# Patient Record
Sex: Female | Born: 1965 | Race: Black or African American | Hispanic: No | Marital: Single | Smoking: Never smoker
Health system: Southern US, Community
[De-identification: ages and names within clinical notes are randomized; demographics above are authoritative.]

## PROBLEM LIST (undated history)

## (undated) DIAGNOSIS — K589 Irritable bowel syndrome without diarrhea: Secondary | ICD-10-CM

## (undated) DIAGNOSIS — Z9289 Personal history of other medical treatment: Secondary | ICD-10-CM

## (undated) DIAGNOSIS — G43909 Migraine, unspecified, not intractable, without status migrainosus: Secondary | ICD-10-CM

## (undated) DIAGNOSIS — R42 Dizziness and giddiness: Secondary | ICD-10-CM

## (undated) DIAGNOSIS — Z9229 Personal history of other drug therapy: Secondary | ICD-10-CM

## (undated) DIAGNOSIS — J45909 Unspecified asthma, uncomplicated: Secondary | ICD-10-CM

## (undated) DIAGNOSIS — C50919 Malignant neoplasm of unspecified site of unspecified female breast: Secondary | ICD-10-CM

## (undated) DIAGNOSIS — F32A Depression, unspecified: Secondary | ICD-10-CM

## (undated) DIAGNOSIS — K5090 Crohn's disease, unspecified, without complications: Secondary | ICD-10-CM

## (undated) DIAGNOSIS — Z9221 Personal history of antineoplastic chemotherapy: Secondary | ICD-10-CM

## (undated) DIAGNOSIS — N200 Calculus of kidney: Secondary | ICD-10-CM

## (undated) DIAGNOSIS — D279 Benign neoplasm of unspecified ovary: Secondary | ICD-10-CM

## (undated) DIAGNOSIS — G629 Polyneuropathy, unspecified: Secondary | ICD-10-CM

## (undated) DIAGNOSIS — I10 Essential (primary) hypertension: Secondary | ICD-10-CM

## (undated) DIAGNOSIS — D649 Anemia, unspecified: Secondary | ICD-10-CM

## (undated) DIAGNOSIS — F419 Anxiety disorder, unspecified: Secondary | ICD-10-CM

## (undated) DIAGNOSIS — M1990 Unspecified osteoarthritis, unspecified site: Secondary | ICD-10-CM

## (undated) DIAGNOSIS — D219 Benign neoplasm of connective and other soft tissue, unspecified: Secondary | ICD-10-CM

## (undated) DIAGNOSIS — M542 Cervicalgia: Secondary | ICD-10-CM

## (undated) DIAGNOSIS — K5190 Ulcerative colitis, unspecified, without complications: Secondary | ICD-10-CM

## (undated) DIAGNOSIS — Z1371 Encounter for nonprocreative screening for genetic disease carrier status: Secondary | ICD-10-CM

## (undated) DIAGNOSIS — K219 Gastro-esophageal reflux disease without esophagitis: Secondary | ICD-10-CM

## (undated) DIAGNOSIS — R202 Paresthesia of skin: Secondary | ICD-10-CM

## (undated) DIAGNOSIS — M549 Dorsalgia, unspecified: Secondary | ICD-10-CM

## (undated) DIAGNOSIS — G8929 Other chronic pain: Secondary | ICD-10-CM

## (undated) DIAGNOSIS — I890 Lymphedema, not elsewhere classified: Secondary | ICD-10-CM

## (undated) DIAGNOSIS — F329 Major depressive disorder, single episode, unspecified: Secondary | ICD-10-CM

## (undated) DIAGNOSIS — Z923 Personal history of irradiation: Secondary | ICD-10-CM

## (undated) DIAGNOSIS — N390 Urinary tract infection, site not specified: Secondary | ICD-10-CM

## (undated) DIAGNOSIS — IMO0002 Reserved for concepts with insufficient information to code with codable children: Secondary | ICD-10-CM

## (undated) DIAGNOSIS — I639 Cerebral infarction, unspecified: Secondary | ICD-10-CM

## (undated) HISTORY — PX: DILATION AND CURETTAGE OF UTERUS: SHX78

## (undated) HISTORY — DX: Benign neoplasm of connective and other soft tissue, unspecified: D21.9

## (undated) HISTORY — DX: Personal history of other drug therapy: Z92.29

## (undated) HISTORY — PX: MYOMECTOMY: SHX85

## (undated) HISTORY — DX: Reserved for concepts with insufficient information to code with codable children: IMO0002

## (undated) HISTORY — DX: Migraine, unspecified, not intractable, without status migrainosus: G43.909

## (undated) HISTORY — DX: Encounter for nonprocreative screening for genetic disease carrier status: Z13.71

## (undated) HISTORY — DX: Benign neoplasm of unspecified ovary: D27.9

## (undated) HISTORY — PX: TONSILLECTOMY: SUR1361

## (undated) HISTORY — DX: Personal history of antineoplastic chemotherapy: Z92.21

## (undated) HISTORY — PX: BREAST LUMPECTOMY: SHX2

## (undated) HISTORY — DX: Unspecified osteoarthritis, unspecified site: M19.90

## (undated) HISTORY — DX: Lymphedema, not elsewhere classified: I89.0

## (undated) HISTORY — PX: CHOLECYSTECTOMY: SHX55

---

## 1996-09-05 DIAGNOSIS — D649 Anemia, unspecified: Secondary | ICD-10-CM

## 1996-09-05 HISTORY — DX: Anemia, unspecified: D64.9

## 2003-07-14 ENCOUNTER — Emergency Department (HOSPITAL_COMMUNITY): Admission: EM | Admit: 2003-07-14 | Discharge: 2003-07-14 | Payer: Self-pay

## 2004-01-05 ENCOUNTER — Emergency Department (HOSPITAL_COMMUNITY): Admission: EM | Admit: 2004-01-05 | Discharge: 2004-01-05 | Payer: Self-pay | Admitting: Emergency Medicine

## 2004-06-14 ENCOUNTER — Other Ambulatory Visit: Admission: RE | Admit: 2004-06-14 | Discharge: 2004-06-14 | Payer: Self-pay | Admitting: Gynecology

## 2004-09-05 DIAGNOSIS — C50919 Malignant neoplasm of unspecified site of unspecified female breast: Secondary | ICD-10-CM

## 2004-09-05 HISTORY — DX: Malignant neoplasm of unspecified site of unspecified female breast: C50.919

## 2004-09-05 HISTORY — PX: BREAST SURGERY: SHX581

## 2004-09-23 ENCOUNTER — Encounter: Admission: RE | Admit: 2004-09-23 | Discharge: 2004-09-23 | Payer: Self-pay | Admitting: General Surgery

## 2004-09-23 ENCOUNTER — Encounter (INDEPENDENT_AMBULATORY_CARE_PROVIDER_SITE_OTHER): Payer: Self-pay | Admitting: Specialist

## 2004-09-23 ENCOUNTER — Encounter (INDEPENDENT_AMBULATORY_CARE_PROVIDER_SITE_OTHER): Payer: Self-pay | Admitting: Diagnostic Radiology

## 2004-09-29 ENCOUNTER — Emergency Department (HOSPITAL_COMMUNITY): Admission: EM | Admit: 2004-09-29 | Discharge: 2004-09-29 | Payer: Self-pay | Admitting: Emergency Medicine

## 2004-09-30 ENCOUNTER — Encounter: Admission: RE | Admit: 2004-09-30 | Discharge: 2004-09-30 | Payer: Self-pay | Admitting: General Surgery

## 2004-10-01 ENCOUNTER — Encounter: Admission: RE | Admit: 2004-10-01 | Discharge: 2004-10-01 | Payer: Self-pay | Admitting: General Surgery

## 2004-10-04 ENCOUNTER — Encounter (INDEPENDENT_AMBULATORY_CARE_PROVIDER_SITE_OTHER): Payer: Self-pay | Admitting: *Deleted

## 2004-10-04 ENCOUNTER — Ambulatory Visit (HOSPITAL_BASED_OUTPATIENT_CLINIC_OR_DEPARTMENT_OTHER): Admission: RE | Admit: 2004-10-04 | Discharge: 2004-10-04 | Payer: Self-pay | Admitting: General Surgery

## 2004-10-04 ENCOUNTER — Ambulatory Visit (HOSPITAL_COMMUNITY): Admission: RE | Admit: 2004-10-04 | Discharge: 2004-10-04 | Payer: Self-pay | Admitting: General Surgery

## 2004-10-05 ENCOUNTER — Ambulatory Visit: Payer: Self-pay | Admitting: Oncology

## 2004-10-18 ENCOUNTER — Ambulatory Visit (HOSPITAL_COMMUNITY): Admission: RE | Admit: 2004-10-18 | Discharge: 2004-10-18 | Payer: Self-pay | Admitting: Oncology

## 2004-10-21 ENCOUNTER — Ambulatory Visit (HOSPITAL_COMMUNITY): Admission: RE | Admit: 2004-10-21 | Discharge: 2004-10-21 | Payer: Self-pay | Admitting: Oncology

## 2004-11-08 ENCOUNTER — Ambulatory Visit (HOSPITAL_BASED_OUTPATIENT_CLINIC_OR_DEPARTMENT_OTHER): Admission: RE | Admit: 2004-11-08 | Discharge: 2004-11-08 | Payer: Self-pay | Admitting: General Surgery

## 2004-11-08 ENCOUNTER — Ambulatory Visit (HOSPITAL_COMMUNITY): Admission: RE | Admit: 2004-11-08 | Discharge: 2004-11-08 | Payer: Self-pay | Admitting: General Surgery

## 2004-11-22 ENCOUNTER — Ambulatory Visit: Payer: Self-pay | Admitting: Oncology

## 2005-01-03 DIAGNOSIS — Z9221 Personal history of antineoplastic chemotherapy: Secondary | ICD-10-CM

## 2005-01-03 HISTORY — DX: Personal history of antineoplastic chemotherapy: Z92.21

## 2005-01-11 ENCOUNTER — Ambulatory Visit: Payer: Self-pay | Admitting: Oncology

## 2005-02-09 ENCOUNTER — Ambulatory Visit: Admission: RE | Admit: 2005-02-09 | Discharge: 2005-04-18 | Payer: Self-pay | Admitting: Radiation Oncology

## 2005-02-11 ENCOUNTER — Encounter: Admission: RE | Admit: 2005-02-11 | Discharge: 2005-02-11 | Payer: Self-pay | Admitting: Radiation Oncology

## 2005-03-09 ENCOUNTER — Emergency Department (HOSPITAL_COMMUNITY): Admission: EM | Admit: 2005-03-09 | Discharge: 2005-03-09 | Payer: Self-pay | Admitting: Emergency Medicine

## 2005-03-14 ENCOUNTER — Ambulatory Visit (HOSPITAL_COMMUNITY): Admission: RE | Admit: 2005-03-14 | Discharge: 2005-03-14 | Payer: Self-pay | Admitting: Oncology

## 2005-03-15 ENCOUNTER — Ambulatory Visit: Payer: Self-pay | Admitting: Oncology

## 2005-04-05 DIAGNOSIS — IMO0001 Reserved for inherently not codable concepts without codable children: Secondary | ICD-10-CM

## 2005-04-05 HISTORY — DX: Reserved for inherently not codable concepts without codable children: IMO0001

## 2005-04-27 ENCOUNTER — Ambulatory Visit (HOSPITAL_BASED_OUTPATIENT_CLINIC_OR_DEPARTMENT_OTHER): Admission: RE | Admit: 2005-04-27 | Discharge: 2005-04-27 | Payer: Self-pay | Admitting: General Surgery

## 2005-05-10 ENCOUNTER — Ambulatory Visit: Payer: Self-pay | Admitting: Oncology

## 2005-06-10 ENCOUNTER — Emergency Department (HOSPITAL_COMMUNITY): Admission: EM | Admit: 2005-06-10 | Discharge: 2005-06-10 | Payer: Self-pay | Admitting: Emergency Medicine

## 2005-06-15 ENCOUNTER — Other Ambulatory Visit: Admission: RE | Admit: 2005-06-15 | Discharge: 2005-06-15 | Payer: Self-pay | Admitting: Gynecology

## 2005-06-30 ENCOUNTER — Ambulatory Visit (HOSPITAL_COMMUNITY): Admission: RE | Admit: 2005-06-30 | Discharge: 2005-06-30 | Payer: Self-pay | Admitting: Gastroenterology

## 2005-07-02 ENCOUNTER — Emergency Department (HOSPITAL_COMMUNITY): Admission: EM | Admit: 2005-07-02 | Discharge: 2005-07-02 | Payer: Self-pay | Admitting: Emergency Medicine

## 2005-07-19 ENCOUNTER — Ambulatory Visit: Payer: Self-pay | Admitting: Oncology

## 2005-07-20 ENCOUNTER — Ambulatory Visit (HOSPITAL_COMMUNITY): Admission: RE | Admit: 2005-07-20 | Discharge: 2005-07-20 | Payer: Self-pay | Admitting: Oncology

## 2005-07-26 ENCOUNTER — Emergency Department (HOSPITAL_COMMUNITY): Admission: EM | Admit: 2005-07-26 | Discharge: 2005-07-26 | Payer: Self-pay | Admitting: Emergency Medicine

## 2005-09-05 HISTORY — PX: ABDOMINAL HYSTERECTOMY: SHX81

## 2005-09-18 ENCOUNTER — Emergency Department (HOSPITAL_COMMUNITY): Admission: EM | Admit: 2005-09-18 | Discharge: 2005-09-18 | Payer: Self-pay | Admitting: Emergency Medicine

## 2005-09-25 ENCOUNTER — Emergency Department (HOSPITAL_COMMUNITY): Admission: EM | Admit: 2005-09-25 | Discharge: 2005-09-25 | Payer: Self-pay | Admitting: Emergency Medicine

## 2005-10-21 ENCOUNTER — Ambulatory Visit: Payer: Self-pay | Admitting: Oncology

## 2005-10-26 ENCOUNTER — Inpatient Hospital Stay (HOSPITAL_COMMUNITY): Admission: RE | Admit: 2005-10-26 | Discharge: 2005-10-28 | Payer: Self-pay | Admitting: Gynecology

## 2005-10-26 ENCOUNTER — Encounter (INDEPENDENT_AMBULATORY_CARE_PROVIDER_SITE_OTHER): Payer: Self-pay | Admitting: Specialist

## 2006-01-24 ENCOUNTER — Ambulatory Visit: Payer: Self-pay | Admitting: Oncology

## 2006-01-24 LAB — COMPREHENSIVE METABOLIC PANEL
ALT: 17 U/L (ref 0–40)
Alkaline Phosphatase: 82 U/L (ref 39–117)
Glucose, Bld: 99 mg/dL (ref 70–99)
Sodium: 138 mEq/L (ref 135–145)
Total Bilirubin: 0.6 mg/dL (ref 0.3–1.2)
Total Protein: 7.1 g/dL (ref 6.0–8.3)

## 2006-01-24 LAB — CBC WITH DIFFERENTIAL/PLATELET
BASO%: 0.6 % (ref 0.0–2.0)
EOS%: 5.1 % (ref 0.0–7.0)
LYMPH%: 30.7 % (ref 14.0–48.0)
MCHC: 34 g/dL (ref 32.0–36.0)
MCV: 86.2 fL (ref 81.0–101.0)
MONO%: 10.5 % (ref 0.0–13.0)
Platelets: 195 10*3/uL (ref 145–400)
RBC: 4.41 10*6/uL (ref 3.70–5.32)

## 2006-02-13 LAB — CBC WITH DIFFERENTIAL/PLATELET
BASO%: 0.5 % (ref 0.0–2.0)
HCT: 38.2 % (ref 34.8–46.6)
MCHC: 34.1 g/dL (ref 32.0–36.0)
MONO#: 0.5 10*3/uL (ref 0.1–0.9)
NEUT%: 66.6 % (ref 39.6–76.8)
RBC: 4.49 10*6/uL (ref 3.70–5.32)
RDW: 13.3 % (ref 11.3–14.5)
WBC: 4.7 10*3/uL (ref 3.9–10.0)
lymph#: 0.9 10*3/uL (ref 0.9–3.3)

## 2006-02-13 LAB — COMPREHENSIVE METABOLIC PANEL
ALT: 9 U/L (ref 0–40)
AST: 11 U/L (ref 0–37)
Albumin: 4.2 g/dL (ref 3.5–5.2)
BUN: 12 mg/dL (ref 6–23)
CO2: 26 mEq/L (ref 19–32)
Calcium: 9.6 mg/dL (ref 8.4–10.5)
Chloride: 105 mEq/L (ref 96–112)
Potassium: 3.9 mEq/L (ref 3.5–5.3)

## 2006-02-28 ENCOUNTER — Ambulatory Visit: Payer: Self-pay | Admitting: Oncology

## 2006-03-22 ENCOUNTER — Emergency Department (HOSPITAL_COMMUNITY): Admission: EM | Admit: 2006-03-22 | Discharge: 2006-03-22 | Payer: Self-pay | Admitting: Emergency Medicine

## 2006-03-31 ENCOUNTER — Emergency Department (HOSPITAL_COMMUNITY): Admission: EM | Admit: 2006-03-31 | Discharge: 2006-03-31 | Payer: Self-pay | Admitting: Emergency Medicine

## 2006-04-25 ENCOUNTER — Encounter: Admission: RE | Admit: 2006-04-25 | Discharge: 2006-04-25 | Payer: Self-pay | Admitting: General Surgery

## 2006-06-28 ENCOUNTER — Other Ambulatory Visit: Admission: RE | Admit: 2006-06-28 | Discharge: 2006-06-28 | Payer: Self-pay | Admitting: Gynecology

## 2006-08-14 ENCOUNTER — Ambulatory Visit: Payer: Self-pay | Admitting: Oncology

## 2006-08-16 LAB — CBC WITH DIFFERENTIAL/PLATELET
Basophils Absolute: 0 10*3/uL (ref 0.0–0.1)
Eosinophils Absolute: 0.2 10*3/uL (ref 0.0–0.5)
HCT: 37.6 % (ref 34.8–46.6)
HGB: 12.9 g/dL (ref 11.6–15.9)
LYMPH%: 33 % (ref 14.0–48.0)
MCV: 88.3 fL (ref 81.0–101.0)
MONO%: 10.5 % (ref 0.0–13.0)
NEUT#: 1.9 10*3/uL (ref 1.5–6.5)
Platelets: 164 10*3/uL (ref 145–400)
RDW: 13.2 % (ref 11.3–14.5)

## 2006-08-16 LAB — COMPREHENSIVE METABOLIC PANEL
Albumin: 4.2 g/dL (ref 3.5–5.2)
Alkaline Phosphatase: 75 U/L (ref 39–117)
BUN: 12 mg/dL (ref 6–23)
Glucose, Bld: 102 mg/dL — ABNORMAL HIGH (ref 70–99)
Potassium: 3.9 mEq/L (ref 3.5–5.3)

## 2006-08-16 LAB — LACTATE DEHYDROGENASE: LDH: 152 U/L (ref 94–250)

## 2006-08-18 ENCOUNTER — Ambulatory Visit (HOSPITAL_COMMUNITY): Admission: RE | Admit: 2006-08-18 | Discharge: 2006-08-18 | Payer: Self-pay | Admitting: Oncology

## 2006-11-13 ENCOUNTER — Emergency Department (HOSPITAL_COMMUNITY): Admission: EM | Admit: 2006-11-13 | Discharge: 2006-11-13 | Payer: Self-pay | Admitting: Emergency Medicine

## 2006-11-14 ENCOUNTER — Ambulatory Visit: Payer: Self-pay | Admitting: Oncology

## 2006-11-14 LAB — COMPREHENSIVE METABOLIC PANEL
ALT: 10 U/L (ref 0–35)
AST: 16 U/L (ref 0–37)
Alkaline Phosphatase: 72 U/L (ref 39–117)
BUN: 7 mg/dL (ref 6–23)
Chloride: 107 mEq/L (ref 96–112)
Creatinine, Ser: 0.84 mg/dL (ref 0.40–1.20)
Total Bilirubin: 0.8 mg/dL (ref 0.3–1.2)

## 2006-11-14 LAB — CBC WITH DIFFERENTIAL/PLATELET
Eosinophils Absolute: 0.2 10*3/uL (ref 0.0–0.5)
LYMPH%: 32.4 % (ref 14.0–48.0)
MCV: 88 fL (ref 81.0–101.0)
MONO%: 8.8 % (ref 0.0–13.0)
NEUT#: 1.8 10*3/uL (ref 1.5–6.5)
Platelets: 164 10*3/uL (ref 145–400)
RBC: 4.59 10*6/uL (ref 3.70–5.32)

## 2006-11-15 ENCOUNTER — Encounter: Admission: RE | Admit: 2006-11-15 | Discharge: 2006-11-15 | Payer: Self-pay | Admitting: Oncology

## 2006-12-02 ENCOUNTER — Emergency Department (HOSPITAL_COMMUNITY): Admission: EM | Admit: 2006-12-02 | Discharge: 2006-12-03 | Payer: Self-pay | Admitting: Emergency Medicine

## 2007-01-22 ENCOUNTER — Encounter: Admission: RE | Admit: 2007-01-22 | Discharge: 2007-01-22 | Payer: Self-pay | Admitting: Orthopaedic Surgery

## 2007-02-05 ENCOUNTER — Ambulatory Visit (HOSPITAL_COMMUNITY): Admission: RE | Admit: 2007-02-05 | Discharge: 2007-02-05 | Payer: Self-pay | Admitting: Orthopaedic Surgery

## 2007-02-15 ENCOUNTER — Ambulatory Visit: Payer: Self-pay | Admitting: Oncology

## 2007-02-19 LAB — COMPREHENSIVE METABOLIC PANEL
ALT: 14 U/L (ref 0–35)
AST: 11 U/L (ref 0–37)
BUN: 16 mg/dL (ref 6–23)
Calcium: 9.3 mg/dL (ref 8.4–10.5)
Chloride: 106 mEq/L (ref 96–112)
Creatinine, Ser: 0.87 mg/dL (ref 0.40–1.20)
Total Bilirubin: 0.4 mg/dL (ref 0.3–1.2)

## 2007-02-19 LAB — CBC WITH DIFFERENTIAL/PLATELET
BASO%: 0.3 % (ref 0.0–2.0)
Basophils Absolute: 0 10*3/uL (ref 0.0–0.1)
EOS%: 2.9 % (ref 0.0–7.0)
HCT: 40.9 % (ref 34.8–46.6)
HGB: 13.9 g/dL (ref 11.6–15.9)
LYMPH%: 21.7 % (ref 14.0–48.0)
MCH: 30 pg (ref 26.0–34.0)
MCHC: 33.9 g/dL (ref 32.0–36.0)
MCV: 88.5 fL (ref 81.0–101.0)
NEUT%: 67.7 % (ref 39.6–76.8)
Platelets: 168 10*3/uL (ref 145–400)
lymph#: 1.3 10*3/uL (ref 0.9–3.3)

## 2007-02-19 LAB — LACTATE DEHYDROGENASE: LDH: 197 U/L (ref 94–250)

## 2007-02-20 ENCOUNTER — Encounter: Admission: RE | Admit: 2007-02-20 | Discharge: 2007-02-20 | Payer: Self-pay | Admitting: Orthopaedic Surgery

## 2007-02-28 ENCOUNTER — Emergency Department (HOSPITAL_COMMUNITY): Admission: EM | Admit: 2007-02-28 | Discharge: 2007-02-28 | Payer: Self-pay | Admitting: Emergency Medicine

## 2007-03-15 ENCOUNTER — Encounter: Admission: RE | Admit: 2007-03-15 | Discharge: 2007-03-15 | Payer: Self-pay | Admitting: Orthopaedic Surgery

## 2007-04-30 ENCOUNTER — Encounter: Admission: RE | Admit: 2007-04-30 | Discharge: 2007-04-30 | Payer: Self-pay | Admitting: Oncology

## 2007-10-04 ENCOUNTER — Ambulatory Visit: Payer: Self-pay | Admitting: Oncology

## 2007-10-08 LAB — CBC WITH DIFFERENTIAL/PLATELET
BASO%: 0.4 % (ref 0.0–2.0)
EOS%: 1.9 % (ref 0.0–7.0)
MCH: 29.5 pg (ref 26.0–34.0)
MCHC: 34.2 g/dL (ref 32.0–36.0)
NEUT%: 60.3 % (ref 39.6–76.8)
RBC: 4.95 10*6/uL (ref 3.70–5.32)
RDW: 12.8 % (ref 11.3–14.5)
lymph#: 1.6 10*3/uL (ref 0.9–3.3)

## 2007-10-09 LAB — COMPREHENSIVE METABOLIC PANEL
ALT: 11 U/L (ref 0–35)
AST: 13 U/L (ref 0–37)
Calcium: 9.7 mg/dL (ref 8.4–10.5)
Chloride: 103 mEq/L (ref 96–112)
Creatinine, Ser: 0.92 mg/dL (ref 0.40–1.20)
Potassium: 4.1 mEq/L (ref 3.5–5.3)

## 2007-10-11 LAB — VITAMIN D 1,25 DIHYDROXY: Vit D, 1,25-Dihydroxy: 22 pg/mL (ref 6–62)

## 2007-11-09 ENCOUNTER — Other Ambulatory Visit: Admission: RE | Admit: 2007-11-09 | Discharge: 2007-11-09 | Payer: Self-pay | Admitting: Gynecology

## 2008-04-02 ENCOUNTER — Encounter: Admission: RE | Admit: 2008-04-02 | Discharge: 2008-04-02 | Payer: Self-pay | Admitting: Oncology

## 2008-04-30 ENCOUNTER — Encounter: Admission: RE | Admit: 2008-04-30 | Discharge: 2008-04-30 | Payer: Self-pay | Admitting: Oncology

## 2008-04-30 ENCOUNTER — Ambulatory Visit: Payer: Self-pay | Admitting: Oncology

## 2008-05-02 LAB — CBC WITH DIFFERENTIAL/PLATELET
EOS%: 4 % (ref 0.0–7.0)
Eosinophils Absolute: 0.2 10*3/uL (ref 0.0–0.5)
HGB: 13.3 g/dL (ref 11.6–15.9)
MCV: 88.4 fL (ref 81.0–101.0)
MONO%: 4.9 % (ref 0.0–13.0)
NEUT#: 2.4 10*3/uL (ref 1.5–6.5)
RBC: 4.34 10*6/uL (ref 3.70–5.32)
RDW: 13.4 % (ref 11.3–14.5)
WBC: 4.6 10*3/uL (ref 3.9–10.0)
lymph#: 1.8 10*3/uL (ref 0.9–3.3)

## 2008-05-04 ENCOUNTER — Encounter: Admission: RE | Admit: 2008-05-04 | Discharge: 2008-05-04 | Payer: Self-pay | Admitting: Oncology

## 2008-05-05 LAB — COMPREHENSIVE METABOLIC PANEL
AST: 15 U/L (ref 0–37)
Albumin: 4.2 g/dL (ref 3.5–5.2)
Alkaline Phosphatase: 88 U/L (ref 39–117)
Calcium: 9 mg/dL (ref 8.4–10.5)
Chloride: 109 mEq/L (ref 96–112)
Glucose, Bld: 117 mg/dL — ABNORMAL HIGH (ref 70–99)
Potassium: 3.9 mEq/L (ref 3.5–5.3)
Sodium: 140 mEq/L (ref 135–145)
Total Protein: 7 g/dL (ref 6.0–8.3)

## 2008-06-27 ENCOUNTER — Emergency Department (HOSPITAL_COMMUNITY): Admission: EM | Admit: 2008-06-27 | Discharge: 2008-06-27 | Payer: Self-pay | Admitting: Emergency Medicine

## 2008-11-10 ENCOUNTER — Other Ambulatory Visit: Admission: RE | Admit: 2008-11-10 | Discharge: 2008-11-10 | Payer: Self-pay | Admitting: Gynecology

## 2008-11-10 ENCOUNTER — Encounter: Payer: Self-pay | Admitting: Gynecology

## 2008-11-10 ENCOUNTER — Ambulatory Visit: Payer: Self-pay | Admitting: Gynecology

## 2008-11-12 ENCOUNTER — Ambulatory Visit: Payer: Self-pay | Admitting: Oncology

## 2008-11-14 LAB — CBC WITH DIFFERENTIAL/PLATELET
Eosinophils Absolute: 0.1 10*3/uL (ref 0.0–0.5)
LYMPH%: 39.6 % (ref 14.0–49.7)
MONO#: 0.3 10*3/uL (ref 0.1–0.9)
NEUT#: 2 10*3/uL (ref 1.5–6.5)
Platelets: 157 10*3/uL (ref 145–400)
RBC: 4.64 10*6/uL (ref 3.70–5.45)
RDW: 13.2 % (ref 11.2–14.5)
WBC: 4.1 10*3/uL (ref 3.9–10.3)
nRBC: 0 % (ref 0–0)

## 2008-11-17 LAB — CANCER ANTIGEN 27.29: CA 27.29: 22 U/mL (ref 0–39)

## 2008-11-17 LAB — COMPREHENSIVE METABOLIC PANEL
ALT: 10 U/L (ref 0–35)
CO2: 25 mEq/L (ref 19–32)
Calcium: 9.4 mg/dL (ref 8.4–10.5)
Chloride: 104 mEq/L (ref 96–112)
Sodium: 138 mEq/L (ref 135–145)
Total Protein: 7.4 g/dL (ref 6.0–8.3)

## 2008-11-21 LAB — TSH: TSH: 1.748 u[IU]/mL (ref 0.350–4.500)

## 2008-12-02 LAB — ESTRADIOL, ULTRA SENS

## 2009-02-26 ENCOUNTER — Other Ambulatory Visit: Admission: RE | Admit: 2009-02-26 | Discharge: 2009-02-26 | Payer: Self-pay | Admitting: Gynecology

## 2009-02-26 ENCOUNTER — Ambulatory Visit: Payer: Self-pay | Admitting: Gynecology

## 2009-02-26 ENCOUNTER — Encounter: Payer: Self-pay | Admitting: Gynecology

## 2009-03-17 ENCOUNTER — Ambulatory Visit: Payer: Self-pay | Admitting: Gynecology

## 2009-03-23 ENCOUNTER — Ambulatory Visit: Payer: Self-pay | Admitting: Gynecology

## 2009-04-02 ENCOUNTER — Ambulatory Visit (HOSPITAL_COMMUNITY): Admission: RE | Admit: 2009-04-02 | Discharge: 2009-04-02 | Payer: Self-pay | Admitting: Neurosurgery

## 2009-05-01 ENCOUNTER — Encounter: Admission: RE | Admit: 2009-05-01 | Discharge: 2009-05-01 | Payer: Self-pay | Admitting: Oncology

## 2009-05-08 ENCOUNTER — Ambulatory Visit: Payer: Self-pay | Admitting: Oncology

## 2009-05-13 LAB — CBC WITH DIFFERENTIAL/PLATELET
BASO%: 0.5 % (ref 0.0–2.0)
EOS%: 2.7 % (ref 0.0–7.0)
LYMPH%: 32.5 % (ref 14.0–49.7)
MCH: 30.1 pg (ref 25.1–34.0)
MCHC: 34.4 g/dL (ref 31.5–36.0)
MCV: 87.4 fL (ref 79.5–101.0)
MONO%: 7.2 % (ref 0.0–14.0)
Platelets: 148 10*3/uL (ref 145–400)
RBC: 4.32 10*6/uL (ref 3.70–5.45)

## 2009-05-13 LAB — COMPREHENSIVE METABOLIC PANEL
AST: 16 U/L (ref 0–37)
Alkaline Phosphatase: 104 U/L (ref 39–117)
Glucose, Bld: 94 mg/dL (ref 70–99)
Sodium: 137 mEq/L (ref 135–145)
Total Bilirubin: 0.4 mg/dL (ref 0.3–1.2)
Total Protein: 7.2 g/dL (ref 6.0–8.3)

## 2009-05-14 LAB — CANCER ANTIGEN 27.29: CA 27.29: 20 U/mL (ref 0–39)

## 2009-06-16 ENCOUNTER — Ambulatory Visit: Payer: Self-pay | Admitting: Oncology

## 2009-09-01 ENCOUNTER — Encounter: Admission: RE | Admit: 2009-09-01 | Discharge: 2009-09-01 | Payer: Self-pay | Admitting: Oncology

## 2009-09-16 ENCOUNTER — Emergency Department (HOSPITAL_COMMUNITY): Admission: EM | Admit: 2009-09-16 | Discharge: 2009-09-16 | Payer: Self-pay | Admitting: Emergency Medicine

## 2009-10-12 ENCOUNTER — Encounter: Admission: RE | Admit: 2009-10-12 | Discharge: 2009-10-12 | Payer: Self-pay | Admitting: Neurosurgery

## 2009-10-27 ENCOUNTER — Ambulatory Visit: Payer: Self-pay | Admitting: Gynecology

## 2009-12-08 ENCOUNTER — Ambulatory Visit: Payer: Self-pay | Admitting: Oncology

## 2009-12-10 LAB — CBC WITH DIFFERENTIAL/PLATELET
BASO%: 0.5 % (ref 0.0–2.0)
Basophils Absolute: 0 10*3/uL (ref 0.0–0.1)
EOS%: 3.1 % (ref 0.0–7.0)
Eosinophils Absolute: 0.2 10*3/uL (ref 0.0–0.5)
HCT: 38.6 % (ref 34.8–46.6)
HGB: 12.9 g/dL (ref 11.6–15.9)
LYMPH%: 35.5 % (ref 14.0–49.7)
MCH: 30.3 pg (ref 25.1–34.0)
MCHC: 33.4 g/dL (ref 31.5–36.0)
MCV: 90.6 fL (ref 79.5–101.0)
MONO#: 0.3 10*3/uL (ref 0.1–0.9)
MONO%: 6 % (ref 0.0–14.0)
NEUT#: 3 10*3/uL (ref 1.5–6.5)
NEUT%: 54.9 % (ref 38.4–76.8)
Platelets: 166 10*3/uL (ref 145–400)
RBC: 4.26 10*6/uL (ref 3.70–5.45)
RDW: 13.2 % (ref 11.2–14.5)
WBC: 5.5 10*3/uL (ref 3.9–10.3)
lymph#: 2 10*3/uL (ref 0.9–3.3)
nRBC: 0 % (ref 0–0)

## 2009-12-10 LAB — COMPREHENSIVE METABOLIC PANEL
ALT: 12 U/L (ref 0–35)
AST: 14 U/L (ref 0–37)
Albumin: 4.1 g/dL (ref 3.5–5.2)
Alkaline Phosphatase: 72 U/L (ref 39–117)
CO2: 27 mEq/L (ref 19–32)
Chloride: 109 mEq/L (ref 96–112)
Glucose, Bld: 91 mg/dL (ref 70–99)
Potassium: 3.8 mEq/L (ref 3.5–5.3)
Sodium: 141 mEq/L (ref 135–145)

## 2009-12-17 ENCOUNTER — Other Ambulatory Visit: Admission: RE | Admit: 2009-12-17 | Discharge: 2009-12-17 | Payer: Self-pay | Admitting: Gynecology

## 2009-12-17 ENCOUNTER — Ambulatory Visit: Payer: Self-pay | Admitting: Gynecology

## 2009-12-24 LAB — MAGNESIUM: Magnesium: 1.9 mg/dL (ref 1.5–2.5)

## 2009-12-24 LAB — T4: T4, Total: 11 ug/dL (ref 5.0–12.5)

## 2009-12-24 LAB — LIPID PANEL
LDL Cholesterol: 124 mg/dL — ABNORMAL HIGH (ref 0–99)
Total CHOL/HDL Ratio: 3.5 Ratio

## 2009-12-24 LAB — T3 UPTAKE: T3 Uptake: 31.4 % (ref 22.5–37.0)

## 2009-12-24 LAB — TSH: TSH: 2.944 u[IU]/mL (ref 0.350–4.500)

## 2010-01-20 ENCOUNTER — Emergency Department (HOSPITAL_COMMUNITY): Admission: EM | Admit: 2010-01-20 | Discharge: 2010-01-20 | Payer: Self-pay | Admitting: Emergency Medicine

## 2010-03-30 ENCOUNTER — Emergency Department (HOSPITAL_COMMUNITY): Admission: EM | Admit: 2010-03-30 | Discharge: 2010-03-30 | Payer: Self-pay | Admitting: Emergency Medicine

## 2010-05-03 ENCOUNTER — Encounter: Admission: RE | Admit: 2010-05-03 | Discharge: 2010-05-03 | Payer: Self-pay | Admitting: Oncology

## 2010-06-28 ENCOUNTER — Ambulatory Visit: Payer: Self-pay | Admitting: Oncology

## 2010-06-29 LAB — CBC WITH DIFFERENTIAL/PLATELET
BASO%: 0.7 % (ref 0.0–2.0)
Eosinophils Absolute: 0.1 10*3/uL (ref 0.0–0.5)
HGB: 13.1 g/dL (ref 11.6–15.9)
MCH: 31.5 pg (ref 25.1–34.0)
MCV: 90.4 fL (ref 79.5–101.0)
MONO#: 0.3 10*3/uL (ref 0.1–0.9)
MONO%: 6.7 % (ref 0.0–14.0)
NEUT%: 54.9 % (ref 38.4–76.8)
RBC: 4.16 10*6/uL (ref 3.70–5.45)
RDW: 13.3 % (ref 11.2–14.5)
WBC: 4.4 10*3/uL (ref 3.9–10.3)

## 2010-06-30 LAB — COMPREHENSIVE METABOLIC PANEL
Alkaline Phosphatase: 67 U/L (ref 39–117)
Calcium: 10 mg/dL (ref 8.4–10.5)
Chloride: 104 mEq/L (ref 96–112)
Creatinine, Ser: 0.9 mg/dL (ref 0.40–1.20)
Glucose, Bld: 93 mg/dL (ref 70–99)
Potassium: 4.3 mEq/L (ref 3.5–5.3)
Total Bilirubin: 0.3 mg/dL (ref 0.3–1.2)

## 2010-06-30 LAB — CANCER ANTIGEN 27.29: CA 27.29: 22 U/mL (ref 0–39)

## 2010-07-06 ENCOUNTER — Encounter: Admission: RE | Admit: 2010-07-06 | Discharge: 2010-07-06 | Payer: Self-pay | Admitting: Anesthesiology

## 2010-09-26 ENCOUNTER — Encounter: Payer: Self-pay | Admitting: Oncology

## 2010-11-20 LAB — POCT I-STAT, CHEM 8
BUN: 14 mg/dL (ref 6–23)
Calcium, Ion: 1.16 mmol/L (ref 1.12–1.32)
HCT: 44 % (ref 36.0–46.0)
Hemoglobin: 15 g/dL (ref 12.0–15.0)
Sodium: 139 mEq/L (ref 135–145)
TCO2: 24 mmol/L (ref 0–100)

## 2010-11-20 LAB — POCT CARDIAC MARKERS
CKMB, poc: <1 ng/mL — ABNORMAL LOW (ref 1.0–8.0)
Troponin i, poc: <0.05 ng/mL (ref 0.00–0.09)

## 2010-11-20 LAB — URINALYSIS, ROUTINE W REFLEX MICROSCOPIC
Glucose, UA: NEGATIVE mg/dL
Nitrite: NEGATIVE
Protein, ur: NEGATIVE mg/dL
pH: 5.5 (ref 5.0–8.0)

## 2010-11-20 LAB — POCT PREGNANCY, URINE: Preg Test, Ur: NEGATIVE

## 2010-11-22 LAB — SEDIMENTATION RATE: Sed Rate: 7 mm/hr (ref 0–22)

## 2010-11-22 LAB — DIFFERENTIAL
Basophils Relative: 1 % (ref 0–1)
Eosinophils Relative: 3 % (ref 0–5)
Lymphocytes Relative: 24 % (ref 12–46)
Monocytes Absolute: 0.3 10*3/uL (ref 0.1–1.0)
Neutrophils Relative %: 64 % (ref 43–77)

## 2010-11-22 LAB — CBC
MCV: 91.1 fL (ref 78.0–100.0)
Platelets: 127 10*3/uL — ABNORMAL LOW (ref 150–400)
RBC: 4.09 MIL/uL (ref 3.87–5.11)
WBC: 3.9 10*3/uL — ABNORMAL LOW (ref 4.0–10.5)

## 2010-11-22 LAB — BASIC METABOLIC PANEL
BUN: 15 mg/dL (ref 6–23)
Calcium: 9.1 mg/dL (ref 8.4–10.5)
Creatinine, Ser: 0.81 mg/dL (ref 0.4–1.2)
GFR calc Af Amer: >60 mL/min (ref >60)
GFR calc non Af Amer: >60 mL/min (ref >60)

## 2010-12-23 ENCOUNTER — Other Ambulatory Visit (HOSPITAL_COMMUNITY)
Admission: RE | Admit: 2010-12-23 | Discharge: 2010-12-23 | Disposition: A | Discharge status: Home / Self Care | Payer: 59 | Source: Ambulatory Visit | Attending: Gynecology | Admitting: Gynecology

## 2010-12-23 ENCOUNTER — Other Ambulatory Visit: Payer: Self-pay | Admitting: Gynecology

## 2010-12-23 ENCOUNTER — Encounter (INDEPENDENT_AMBULATORY_CARE_PROVIDER_SITE_OTHER): Payer: 59 | Admitting: Gynecology

## 2010-12-23 DIAGNOSIS — Z853 Personal history of malignant neoplasm of breast: Secondary | ICD-10-CM

## 2010-12-23 DIAGNOSIS — Z124 Encounter for screening for malignant neoplasm of cervix: Secondary | ICD-10-CM | POA: Insufficient documentation

## 2010-12-23 DIAGNOSIS — Z01419 Encounter for gynecological examination (general) (routine) without abnormal findings: Secondary | ICD-10-CM

## 2010-12-28 ENCOUNTER — Inpatient Hospital Stay (INDEPENDENT_AMBULATORY_CARE_PROVIDER_SITE_OTHER)
Admission: RE | Admit: 2010-12-28 | Discharge: 2010-12-28 | Disposition: A | Discharge status: Home / Self Care | Payer: 59 | Source: Ambulatory Visit

## 2010-12-28 DIAGNOSIS — M2550 Pain in unspecified joint: Secondary | ICD-10-CM

## 2010-12-30 ENCOUNTER — Other Ambulatory Visit: Payer: Self-pay | Admitting: Oncology

## 2010-12-30 ENCOUNTER — Encounter (HOSPITAL_BASED_OUTPATIENT_CLINIC_OR_DEPARTMENT_OTHER): Payer: 59 | Admitting: Oncology

## 2010-12-30 DIAGNOSIS — Z171 Estrogen receptor negative status [ER-]: Secondary | ICD-10-CM

## 2010-12-30 DIAGNOSIS — C50319 Malignant neoplasm of lower-inner quadrant of unspecified female breast: Secondary | ICD-10-CM

## 2010-12-30 LAB — CBC WITH DIFFERENTIAL/PLATELET
BASO%: 0.6 % (ref 0.0–2.0)
Basophils Absolute: 0 10*3/uL (ref 0.0–0.1)
EOS%: 1.3 % (ref 0.0–7.0)
HCT: 40.9 % (ref 34.8–46.6)
HGB: 13.9 g/dL (ref 11.6–15.9)
MCH: 30.4 pg (ref 25.1–34.0)
MCHC: 34 g/dL (ref 31.5–36.0)
MONO#: 0.4 10*3/uL (ref 0.1–0.9)
RDW: 13.6 % (ref 11.2–14.5)
WBC: 5 10*3/uL (ref 3.9–10.3)
lymph#: 1.3 10*3/uL (ref 0.9–3.3)

## 2010-12-31 LAB — CANCER ANTIGEN 27.29: CA 27.29: 29 U/mL (ref 0–39)

## 2010-12-31 LAB — COMPREHENSIVE METABOLIC PANEL
ALT: 11 U/L (ref 0–35)
AST: 13 U/L (ref 0–37)
Albumin: 4.3 g/dL (ref 3.5–5.2)
CO2: 22 mEq/L (ref 19–32)
Calcium: 9.7 mg/dL (ref 8.4–10.5)
Chloride: 107 mEq/L (ref 96–112)
Potassium: 4.2 mEq/L (ref 3.5–5.3)

## 2011-01-06 ENCOUNTER — Encounter (HOSPITAL_BASED_OUTPATIENT_CLINIC_OR_DEPARTMENT_OTHER): Payer: 59 | Admitting: Oncology

## 2011-01-06 DIAGNOSIS — C50319 Malignant neoplasm of lower-inner quadrant of unspecified female breast: Secondary | ICD-10-CM

## 2011-01-06 DIAGNOSIS — Z171 Estrogen receptor negative status [ER-]: Secondary | ICD-10-CM

## 2011-01-21 NOTE — H&P (Signed)
NAME:  Gabrielle Hawkins, Gabrielle Hawkins           ACCOUNT NO.:  [id number]   MEDICAL RECORD NO.:  [id number]          PATIENT TYPE:  AMB   LOCATION:  [facility]                           FACILITY:  [facility]   PHYSICIAN:  Juan H. Toney Rakes, M.D.DATE OF BIRTH:  [birth year]   DATE OF ADMISSION:  10/26/2005  DATE OF DISCHARGE:                                HISTORY & PHYSICAL   CHIEF COMPLAINT:  1.  Debilitating dysmenorrhea and menorrhagia.  2.  Bilateral ovarian cysts.  3.  Patient with a history of invasive intraductal right breast cancer.   HISTORY:  The patient is a 45 year old gravida 1, para 0, AB 1, who is  status post 6 cycles of chemotherapy, the last cycle on Jan 17, 2005 and she  is status post radiation therapy, which was completed on April 15, 2005 for  a T1B, NO, SG receptor negative, progesterone receptor positive, invasive  intraductal cancer of the right breast.  The patient has been on tamoxifen  since that time.  She had an ultrasound done in November of 2006 to evaluate  the lining of her uterus since she had not had a period.  She had had an  [facility], which is reported to be 6.  At that time, she was noted to have  endometrial stripe of 5.4 mm with new intramural myomas and there was a thin  wall of the right ovarian cyst and the left ovary had 3 follicular echo-free  cysts, and the patient was instructed to return back to the office for  followup ultrasound.  The patient had voiced that prior to her  chemoradiation therapy, she has suffered tremendously from dysmenorrhea and  menorrhagia where she passed large clots and would be debilitating, and she  has tried numerous regimens to include nonsteroidals and prior to her  diagnosis of breast cancer, she had been on Depo-Provera, all with minimal  resolution.  The patient's followup ultrasound on September 19, 2005  demonstrated a thin wall ovarian cyst measuring 5.8 x 4.5 x 4.5 mm and  another 1 adjacent to it measuring 4.7 x 3.7 x 4.2 mm, and the  left ovary  had an ovarian cyst measuring 3.2 x 2.7 x 2.9 cm and also a thin wall of an  avascular.  She returned back to the office on February 16 for a  preoperative consultation and to repeat the ultrasound.  The 2 previously  seen small fibroids were noted no larger than 2 cm, but the right ovarian  complex cyst measured 18 x 15 x 13 mm and the left ovarian cyst was  avascular measuring 38 x 29 x 24 mm, a smooth wall and no free fluid was  noted, and the endometrial stripe was 7.9 mm.  Part of her workup, the  patient had a chest x-ray PA and lateral, which was negative.  She had a CT  of the pelvis without contrast and no prominent nodes were noted on the  films.  Her last mammogram in October of 2006 was reported to be normal, as  well as her Pap smear.  The patient presented to the office for a  preoperative consultation on 2 occasions.  Initially, the plan was to  proceed with laparoscopic ovarian cystectomy with possible laparotomy and  possible bilateral oophorectomy.  Then patient had called back and I asked  her to return back to the office on the following day for further  discussion, and she stated that she at her wits-end with the debilitating  dysmenorrhea and menorrhagia that was effecting her quality of life and her  concerns of the possibility of later getting ovarian cancer since she has  breast cancer that she would have preferred to have a total hysterectomy  with removal of both tubes and ovaries.  Of note, her mother was diagnosed  with breast cancer at the age of 15.  The patient underwent BRCA1 and BRCA2  testing, which were negative.   PAST MEDICAL HISTORY:  The patient is allergic to penicillin, Compazine,  codeine, Percocet, Reglan, Imitrex and Wellbutrin.   PAST OBSTETRICAL AND GYNECOLOGICAL HISTORY:  The patient has had prior  myomectomy in 1997 in [place] for fibroids.  She was diagnosed with breast  cancer in January of 2006, completed chemotherapy in  May of 2006 and  radiation in August of 2006 for right breast invasive ductal cancer.  Estrogen receptor negative, progesterone positive, T1B and NO.   MEDICATIONS:  Currently on:  Tamoxifen, vitamin C and Pepcid.  Has a history  of migraines.   PRIOR SURGERIES:  1.  Myomectomy in 1997.  2.  Tonsillectomy in 1992.  3.  D&C in 2002.   FAMILY HISTORY:  Mother with breast cancer at the age of 80, hypertension in  father and grandmother, colon cancer in maternal grandmother, and heart  disease in father and grandmother.   PHYSICAL EXAMINATION:  VITAL SIGNS:  The patient's weight 164 pounds and  blood pressure 118/80.  HEENT:  Unremarkable.  NECK:  Supple.  Trachea midline.  No carotid bruits and no thyromegaly.  LUNGS:  Clear to auscultation without rhonchi or wheezes.  HEART:  Regular rate and rhythm with no murmurs or gallops.  BREAST EXAM:  Evidence of scar from previous lumpectomy on the right breast,  no palpable masses, no supraclavicular or axillary lymphadenopathy.  ABDOMEN:  Soft and nontender without rebound or guarding.  PELVIC EXAM:  Bartholin's, urethral and Skene's glands within normal limits.  The vagina and cervix no itching or discharge.  The uterus upper limits of  normal.  Adnexa slight tenderness on the left adnexa where the large ovarian  cyst was palpated.  RECTAL EXAM:  Not done.   ASSESSMENT:  A 45 year old gravida 1, para 0, AB 1 with a history of  debilitating chronic dysmenorrhea and menorrhagia unresponsive to a  multitude of treatments.  The patient with history in 2006 of right invasive  ductal carcinoma of the breast T1B, NO, estrogen negative, progesterone  receptor positive status post chemotherapy and radiation therapy.  Followup  of endometrial stripe incidental finding of persistent left ovarian cyst  measuring 38 x 29 x 24 mm and a smaller right ovarian cyst measuring 18 x 15 x 13 mm.  Chest x-ray and CT were negative, a recent mammogram and Pap  smear  were negative, and BRCA1 and BRCA2 negative.  We had a lengthy discussion  twice, in person, in the office and by telephone the night before her  surgery.  The patient strongly feels that she would like to have her ovaries  removed, afraid of later on developing some form of cancer or malignant  cysts on  the ovary.  Initially, the plan was to proceed with a laparoscopic  cystectomy, but then she brought to my attention that she cannot tolerate  this worsening dysmenorrhea and menorrhagia that she suffers every month.  She is afraid that when she wears off the tamoxifen, she is going to have  these symptoms once again, and with her history of small fibroids and prior  history of myomectomy, she would rather have a total hysterectomy with  removal of both tubes and ovaries.  We had discussed that there is an  association with patients who have breast cancer, could have ovarian cancer  although her history of receptors were reported to be negative.  Nevertheless, we will comply with the patient's wishes and proceed with a  total abdominal hysterectomy with bilateral salpingo-oophorectomy.  The  patient is fully aware that she will not be able to have any more children  and that we will need to deal with the issues concerning vasomotor symptoms  to include hot flashes, irritability, mood swings, vaginal dryness, the  importance of calcium supplementation for osteoporosis prevention, as well  as a bone density study on a periodic basis.  The patient states that she  has dealt with the issues of the hot flashes on the tamoxifen, but she  cannot deal with the dysmenorrhea and menorrhagia.  The patient is content  at making this decision, fully aware she will not be able have any children.  The potential risk of the operation were discussed to include infection  although she will receive prophylaxis antibiotic.  Since she is allergic to  penicillin, she will receive clindamycin 900 mg IV,  along with gentamicin  1.5 mg per kg.  Also for DVT prophylaxis, she will have PSA stockings.  In  the event of uncontrolling her hemorrhage and she were to need blood or  blood products, the patient is fully aware of the potential risks to include  anaphylactic reactions, hepatitis and AIDS.  I also discussed the risk of  trauma to internal organs with corrective surgery needed to be done at that  point.  All of these issues were discussed with the patient.  Her mother was  present on the last office consultation, all were in agreement, all  questions were answered and we will follow accordingly.  Of note, I had  discussed her case with her medical oncologist, Dr. Eston Esters, the day  before her surgery, as well.  All questions were answered and we will follow  accordingly.   PLAN:  The patient is scheduled for a total abdominal hysterectomy and  bilateral salpingo-oophorectomy Wednesday, February 21 at 8:30 a.m. at [facility].  Please have history and physical available.      Juan H. Toney Rakes, M.D.  Electronically Signed     JHF/MEDQ  D:  10/25/2005  T:  10/26/2005  Job:  [id number]

## 2011-01-21 NOTE — Op Note (Signed)
NAME:  Gabrielle Hawkins, [facility]           ACCOUNT NO.:  [id number]   MEDICAL RECORD NO.:  [id number]          PATIENT TYPE:  AMB   LOCATION:  [place]                          FACILITY:  [facility]   PHYSICIAN:  Rudell Cobb. Annamaria Boots, M.D.   DATE OF BIRTH:  05-30-1966   DATE OF PROCEDURE:  11/08/2004  DATE OF DISCHARGE:                                 OPERATIVE REPORT   PREOPERATIVE DIAGNOSIS:  Stage I carcinoma of the right breast.   POSTOPERATIVE DIAGNOSIS:  Stage I carcinoma of the right breast.   OPERATION:  Insertion of Port-A-Cath under fluoroscopic control.   SURGEON:  Rudell Cobb. Annamaria Boots, M.D.   ANESTHESIA:  MAC.   OPERATIVE PROCEDURE:  The patient placed on the operating table with a roll  between the shoulders and the left upper chest and neck prepped and draped  in usual fashion.   Under local anesthesia, a left subclavian puncture was carried out. It was  little difficult and I hit the artery one time and then we got a good flow  from the vein and the guidewire was inserted and proper positioning of the  guidewire was confirmed on fluoroscopy. We then, under local anesthesia,  made an incision on the anterior chest wall and developed pocket for the  implantable port. We then tunneled between the subclavian puncture site and  the newly developed pocket and passed a catheter through that.  I then  attached it to the port and placed it in the newly developed pocket. The  catheter tip was then placed on the chest wall and was cut at the level of  the fourth interspace which would be the cavoatrial junction.   I then passed the dilator and peel-away sheath over the wire and then  removed the wire and the dilator and passed a catheter through the peel-away  sheath and then removed it. Again, C-arm fluoroscopy was used to confirm  that there was no kinking in the system and that the catheter tip was in the  superior vena cava.   Incisions were then closed with 3-0 Vicryl and subcuticular 4-0  Monocryl and  Steri-Strips.   We then accessed it with a right-angle Huber point needle and the catheter  attached and then fully heparinized the system after aspirated it to be sure  that it aspirated easily.   Dressings were applied and the patient transferred to recovery room in  satisfactory condition having tolerated procedure well.    PRY/MEDQ  D:  11/08/2004  T:  11/08/2004  Job:  [id number]

## 2011-01-21 NOTE — Op Note (Signed)
NAME:  Nibert, [facility]           ACCOUNT NO.:  [id number]   MEDICAL RECORD NO.:  [id number]          PATIENT TYPE:  AMB   LOCATION:  [place]                          FACILITY:  [facility]   PHYSICIAN:  Rudell Cobb. Annamaria Boots, M.D.   DATE OF BIRTH:  1966-04-13   DATE OF PROCEDURE:  04/27/2005  DATE OF DISCHARGE:                                 OPERATIVE REPORT   PREOPERATIVE DIAGNOSIS:  Carcinoma of the right breast.   POSTOPERATIVE DIAGNOSIS:  Carcinoma of the right breast.   OPERATION:  Removal of Port-A-Cath.   SURGEON:  Rudell Cobb. Annamaria Boots, M.D.   ANESTHESIA:  Local.   OPERATIVE PROCEDURE:  The patient placed on the operating table and the left  upper chest prepped and draped in usual fashion.   Under local anesthesia, I incise the old Port-A-Cath scar and exposed the  port. We grasped the catheter and removed it from the subclavian vein and  then, with traction on the catheter, divided the two sutures holding the  port in place and removed it.   There was no bleeding.   Incision was closed with subcuticular 4-0 Monocryl and Steri-Strips.   Dressing applied and the patient then allowed to go home.      Rudell Cobb. Annamaria Boots, M.D.  Electronically Signed     PRY/MEDQ  D:  04/27/2005  T:  04/27/2005  Job:  [id number]

## 2011-01-21 NOTE — Op Note (Signed)
NAME:  Gabrielle Hawkins, Gabrielle Hawkins           ACCOUNT NO.:  [id number]   MEDICAL RECORD NO.:  [id number]          PATIENT TYPE:  INP   LOCATION:  9306                          FACILITY:  [facility]   PHYSICIAN:  Juan H. Toney Rakes, M.D.DATE OF BIRTH:  11/09/1965   DATE OF PROCEDURE:  10/26/2005  DATE OF DISCHARGE:                                 OPERATIVE REPORT   SURGEON:  Juan H. Toney Rakes, M.D.   FIRST ASSISTANT:  Alver Sorrow. Charlies Constable, M.D.   INDICATIONS FOR OPERATION:  A 45 year old gravida 1, para 0, abortus 1 with  worsening dysmenorrhea, menorrhagia, history of breast cancer currently on  tamoxifen. Patient with bilateral ovarian cyst.   PREOPERATIVE DIAGNOSIS:  1.  History of breast cancer on tamoxifen.  2.  Bilateral ovarian cyst.  3.  Worsening dysmenorrhea and menorrhagia.  4.  Bilateral ovarian cysts.   POSTOPERATIVE DIAGNOSIS:  1.  History of breast cancer on tamoxifen.  2.  Bilateral ovarian cyst.  3.  Worsening dysmenorrhea and menorrhagia.  4.  Bilateral ovarian cysts.   ANESTHESIA:  General endotracheal anesthesia.   PROCEDURE PERFORMED:  1.  Pelvic adhesiolysis.  2.  Total abdominal hysterectomy with bilateral salpingo-oophorectomy.  3.  Pelvic washings.   FINDINGS:  1.  The patient had a 4 x 5 cm left ovarian cyst with smooth surface.  2.  A right ovary adhered to the right pelvic sidewall.  3.  Pelvic adhesions.   DESCRIPTION OF OPERATION:  After the patient was adequately counseled she  was taken to the operating room where she underwent a successful general  endotracheal anesthesia. The patient had PSA stockings for DVT prophylaxis  and Foley catheter to monitor urinary output. The patient received  prophylactically clindamycin 900 milligrams plus gentamicin 1.5  milligrams/kg. After general endotracheal anesthesia was obtained, the  patient was placed in the supine position after Foley catheter inserted. The  abdomen was prepped and draped in usual sterile fashion. A  Pfannenstiel  incision was made adjacent to the previous Pfannenstiel scar. The incision  was carried down through the skin, subcutaneous tissue and down to the  rectus fascia whereby midline nick was made. The fascia was incised in  transverse fashion. The peritoneal cavity was entered cautiously. The  O'Connor-O'Sullivan retractor were placed and the patient was placed in  Trendelenburg position. Pelvic washings were obtained and submitted for  cytological evaluation. Following this meticulous pelvic adhesiolysis from  the right pelvic sidewall and left pelvic sidewall to separate the sigmoid  colon from the uterus due to adhesions that were present probably as the  result of previous myomectomy that patient had. The patient also noted to  have a small left broad ligament leiomyoma. The uterus was grasped with  Heaney clamps. The left round ligament was transected and the bladder flap  was established. The left ureter was identified. The posterior broad  ligament was penetrated with the surgeon's finger and the left  infundibulopelvic ligament was clamped, cut, first free tie of 0 Vicryl  suture followed by transfixion stitch of 0 Vicryl suture. After  skeletonization and freeing the remainder of the broad and cardinal ligament  down to level of the internal cervical os. Attention was placed to the right  adnexa. Of note, the left tube and ovary was submitted separately. Due to  the adherence of the right ovary to the pelvic sidewall, it was safer to  remove the uterus and cervix first, then go back and remove the right tube  and ovary. The right round ligament was transected. After skeletonization  and clamping and cutting the remaining broad and cardinal ligaments with 0  Vicryl suture to the level of the internal cervical os. The [employer] was utilized to free the cervix from the vagina and the uterus and  cervix was passed off the operative field. The vaginal cuff angles  were  secured with 0 Vicryl suture and the remaining vaginal cuff was closed with  interrupted sutures of 0 Vicryl suture. Attention was then placed to the  right adnexa. The retroperitoneal space was entered in an effort to identify  the right ureter. Once this was accomplished the right infundibulopelvic  ligament was identified and was clamped, cut and suture ligated with 0  Vicryl suture and meticulous dissection was utilized to free the right tube  and ovary from the pelvic sidewall without causing injury to the ureter  keeping it well in view at all times. The pelvic cavity was then copiously  irrigated with normal saline solution. Gelfoam was applied to the areas of  the pelvic sidewall. Sponge and needle count were correct. The Fredia Sorrow retractor was removed. Visceral peritoneum was not reapproximated  but the rectus fascia was closed with running stitch of 0 Vicryl suture. The  subcutaneous bleeders were Bovie cauterized and skin was reapproximated with  skin clips followed by placing Xeroform gauze and 4 x 8 dressing. The  patient received 0.25% Marcaine for total of 10 mL subcu for postoperative  analgesia. The patient tolerated procedure well and was transferred to  recovery room with stable vital signs. Blood loss was 200 mL. IV fluids 1900  mL of lactated Ringer's. Urine output 200 mL.      Juan H. Toney Rakes, M.D.  Electronically Signed     JHF/MEDQ  D:  10/26/2005  T:  10/27/2005  Job:  [id number]

## 2011-01-21 NOTE — Op Note (Signed)
NAME:  Basista, [facility]           ACCOUNT NO.:  [id number]   MEDICAL RECORD NO.:  [id number]          PATIENT TYPE:  AMB   LOCATION:  [place]                          FACILITY:  [facility]   PHYSICIAN:  Rudell Cobb. Annamaria Boots, M.D.   DATE OF BIRTH:  22-Feb-1966   DATE OF PROCEDURE:  10/04/2004  DATE OF DISCHARGE:                                 OPERATIVE REPORT   PREOPERATIVE DIAGNOSIS:  Stage I carcinoma of the right breast.   POSTOPERATIVE DIAGNOSIS:  Stage I carcinoma of the right breast.   OPERATION:  1.  Blue dye injection.  2.  Right sentinel lymph node biopsy.  3.  Right partial mastectomy.   SURGEON:  Rudell Cobb. Annamaria Boots, M.D.   ANESTHESIA:  General.   OPERATIVE PROCEDURE:  This 45 year old female had presented with a palpable  nodule in the medial portion of her right breast.  Core biopsy had shown it  to be an invasive mammary cancer.   Prior to coming to the operating room, 1 mCi of technetium sulfur colloid  was injected intradermally.   After suitable general anesthesia was induced, the patient was placed in  supine position with the arms extended on the arm board.  Five milliliters  of a mixture of 2 mL of methylene blue and 3 mL of injectable saline was  injected into the subareolar breast tissue and the breast gently massaged  for three minutes.  We then prepped and draped the breast and axilla.   We carefully scanned the internal mammary and the supraclavicular areas as  well as the axilla, and the only hot spot was in the axilla.  A short  transverse right axillary incision was made with dissection through the  subcutaneous tissue to the clavipectoral fascia.  Three separate hot and  blue lymph nodes were identified and removed as sentinel lymph nodes.  There  were no other blue, hot or palpable nodes.  These were submitted to the  pathologist for touch prep for metastatic disease.   While that was being done, I outlined the palpable nodule at about the 4:30  position of the  right breast not far from the inframammary fold.  A curved  incision was then made including an ellipse of skin, and a wide excision of  the palpable nodule was carried out.  The specimen was oriented for the  pathologist and submitted to them for touch prep evaluation of the margins.   The pathologist then reported to sentinel nodes were negative for metastatic  disease and that the margins were clean.   Both incisions were infiltrated with 0.5% Marcaine with adrenaline.  They  were then closed with 3-0 Vicryl subcuticular and subcuticular 4-0 Monocryl  and Steri-Strips.   Dressings were then applied and the patient transferred to the recovery room  in satisfactory condition, having tolerated procedure well.      PRY/MEDQ  D:  10/04/2004  T:  10/04/2004  Job:  [id number]

## 2011-01-21 NOTE — Discharge Summary (Signed)
NAME:  Penick, [facility]           ACCOUNT NO.:  [id number]   MEDICAL RECORD NO.:  [id number]          PATIENT TYPE:  INP   LOCATION:  9306                          FACILITY:  [place]   PHYSICIAN:  Juan H. Toney Rakes, M.D.DATE OF BIRTH:  Jun 22, 1966   DATE OF ADMISSION:  10/26/2005  DATE OF DISCHARGE:  10/28/2005                                 DISCHARGE SUMMARY   HISTORY OF PRESENT ILLNESS:  The patient is a 45 year old gravida 1, para 0,  abortus 1, who was taken to the operating room on February 21 secondary to  debilitating dysmenorrhea, menorrhagia, leiomyomatous uteri, and bilateral  ovarian cysts.  The patient with prior history of invasive intraductal  breast cancer and completed her last radiation therapy in August 2006 and  had been on tamoxifen. The patient underwent TAH-BSO on February 21 with a  normal-appearing uterus, cervix, tubes and cystic ovaries on gross  inspection. The patient's postoperative day #1 hemoglobin/hematocrit were  10.6 and 30.9 respectively, platelet count 159,000.  She had been afebrile,  was normotensive and adequate urine output.  Her PCA pump was discontinued.  She was put on oral Percocet. Also the patient's Foley was discontinued and  she was placed on clonidine patch 0.1 mg transdermally to help with  vasomotor symptoms.  She continued on tamoxifen 25 mg p.o. b.i.d. as  previously been prescribed as well as her prednisone 5 mg p.o. daily. She  had some pruritus perhaps it was to the antibiotic that she had received  prophylactically, so she received Benadryl 25 mg p.o. q. 4-6 hours p.r.n.  pruritus.  She was started on clear liquids and she was beginning to  ambulate and showered later that afternoon. The patient was discharged home  on the second postoperative day. She was up ambulating, tolerating regular  diet well, was to continue the above-mentioned medication and was to return  to the office in the next few days to have her staples removed.   FINAL PATHOLOGY:  Report demonstrated left ovarian benign mucinous  cystadenoma and paratubal leiomyoma. The right ovary had evidence of  endometriosis and tubo-ovarian fibrous adhesions.  No malignancy identified.  The uterus demonstrated benign cervix and secretory endometrium with  leiomyomatous uteri and adenomyosis and serosal fibrous adhesions.   FINAL DIAGNOSIS:  1.  Dysmenorrhea.  2.  Menorrhagia.  3.  Left ovarian mucinous cystadenoma.  4.  Paratubal leiomyoma.  5.  Right ovarian endometriosis.  6.  Tubo-ovarian adhesions.  7.  Adenomyosis.  8.  Leiomyomatous uteri.  9.  Breast cancer status post last radiation therapy August 2006.   FOLLOW UP:  The patient was discharged home on the second postoperative day.  She was ambulating, voiding well, tolerating regular diet well, to return  back to the office in a few days to have her staples removed.  She was  instructed to continue her prednisone 5 mg p.o. daily, her tamoxifen 25 mg  p.o. b.i.d., and clonidine patch 0.1 mg q.  weekly for which prescription had been provided, as well as to take her  Lortab 7.5/500 one p.o. q. 4-6 hours p.r.n. pain. She will return  in a few  days to remove her staples.   Discharge instruction was provided.      Juan H. Toney Rakes, M.D.  Electronically Signed     JHF/MEDQ  D:  11/27/2005  T:  11/29/2005  Job:  [id number]

## 2011-04-26 ENCOUNTER — Other Ambulatory Visit: Payer: Self-pay | Admitting: Oncology

## 2011-04-26 DIAGNOSIS — Z9889 Other specified postprocedural states: Secondary | ICD-10-CM

## 2011-05-05 ENCOUNTER — Ambulatory Visit
Admission: RE | Admit: 2011-05-05 | Discharge: 2011-05-05 | Disposition: A | Discharge status: Home / Self Care | Payer: 59 | Source: Ambulatory Visit | Attending: Oncology | Admitting: Oncology

## 2011-05-05 DIAGNOSIS — Z9889 Other specified postprocedural states: Secondary | ICD-10-CM

## 2011-05-06 ENCOUNTER — Other Ambulatory Visit (HOSPITAL_COMMUNITY): Payer: 59

## 2011-06-06 LAB — POCT CARDIAC MARKERS
CKMB, poc: <1 — ABNORMAL LOW
Myoglobin, poc: 45.6
Troponin i, poc: <0.05
Troponin i, poc: <0.05

## 2011-06-06 LAB — URINALYSIS, ROUTINE W REFLEX MICROSCOPIC
Ketones, ur: NEGATIVE
Nitrite: NEGATIVE
Protein, ur: NEGATIVE

## 2011-06-06 LAB — POCT I-STAT, CHEM 8
Glucose, Bld: 99
HCT: 39
Hemoglobin: 13.3
Potassium: 3.6
TCO2: 22

## 2011-06-06 LAB — DIFFERENTIAL
Basophils Absolute: 0
Basophils Relative: 1
Eosinophils Absolute: 0.1
Monocytes Absolute: 0.5
Neutro Abs: 2.9
Neutrophils Relative %: 52

## 2011-06-06 LAB — CBC
Hemoglobin: 13.1
MCHC: 33.8
RDW: 13.4

## 2011-06-22 LAB — I-STAT 8, (EC8 V) (CONVERTED LAB)
Acid-Base Excess: 2
Bicarbonate: 29 — ABNORMAL HIGH
HCT: 45
Operator id: 247071
TCO2: 31
pCO2, Ven: 50.9 — ABNORMAL HIGH
pH, Ven: 7.364 — ABNORMAL HIGH

## 2011-06-22 LAB — POCT RAPID STREP A: Streptococcus, Group A Screen (Direct): NEGATIVE

## 2011-07-08 ENCOUNTER — Other Ambulatory Visit: Payer: Self-pay | Admitting: Oncology

## 2011-07-08 ENCOUNTER — Encounter (HOSPITAL_BASED_OUTPATIENT_CLINIC_OR_DEPARTMENT_OTHER): Payer: 59 | Admitting: Oncology

## 2011-07-08 DIAGNOSIS — R6889 Other general symptoms and signs: Secondary | ICD-10-CM

## 2011-07-08 LAB — CBC WITH DIFFERENTIAL/PLATELET
Basophils Absolute: 0 10*3/uL (ref 0.0–0.1)
EOS%: 3.1 % (ref 0.0–7.0)
Eosinophils Absolute: 0.1 10*3/uL (ref 0.0–0.5)
HCT: 41.2 % (ref 34.8–46.6)
HGB: 13.8 g/dL (ref 11.6–15.9)
MCH: 30.1 pg (ref 25.1–34.0)
MCV: 90.2 fL (ref 79.5–101.0)
MONO%: 6.7 % (ref 0.0–14.0)
NEUT#: 2.7 10*3/uL (ref 1.5–6.5)
NEUT%: 55.8 % (ref 38.4–76.8)

## 2011-07-11 LAB — COMPREHENSIVE METABOLIC PANEL WITH GFR
ALT: 10 U/L (ref 0–35)
AST: 12 U/L (ref 0–37)
Albumin: 4.5 g/dL (ref 3.5–5.2)
Alkaline Phosphatase: 80 U/L (ref 39–117)
BUN: 14 mg/dL (ref 6–23)
CO2: 23 meq/L (ref 19–32)
Calcium: 9.4 mg/dL (ref 8.4–10.5)
Chloride: 105 meq/L (ref 96–112)
Creatinine, Ser: 0.87 mg/dL (ref 0.50–1.10)
Glucose, Bld: 98 mg/dL (ref 70–99)
Potassium: 3.9 meq/L (ref 3.5–5.3)
Sodium: 139 meq/L (ref 135–145)
Total Bilirubin: 0.4 mg/dL (ref 0.3–1.2)
Total Protein: 7.4 g/dL (ref 6.0–8.3)

## 2011-07-11 LAB — THYROID STIMULATING IMMUNOGLOBULIN: TSI: 16 %{baseline}

## 2011-07-11 LAB — T4, FREE: Free T4: 1.12 ng/dL (ref 0.80–1.80)

## 2011-07-11 LAB — TSH: TSH: 2.307 u[IU]/mL (ref 0.350–4.500)

## 2011-07-11 LAB — THYROID PEROXIDASE ANTIBODY: Thyroperoxidase Ab SerPl-aCnc: <10 IU/mL (ref <35.0)

## 2011-07-11 LAB — CANCER ANTIGEN 27.29: CA 27.29: 24 U/mL (ref 0–39)

## 2011-07-11 LAB — T3: T3, Total: 123.9 ng/dL (ref 80.0–204.0)

## 2011-07-11 LAB — VITAMIN D 25 HYDROXY (VIT D DEFICIENCY, FRACTURES): Vit D, 25-Hydroxy: 25 ng/mL — ABNORMAL LOW (ref 30–89)

## 2011-07-15 ENCOUNTER — Telehealth: Payer: Self-pay | Admitting: *Deleted

## 2011-07-15 ENCOUNTER — Ambulatory Visit (HOSPITAL_BASED_OUTPATIENT_CLINIC_OR_DEPARTMENT_OTHER): Payer: 59 | Admitting: Oncology

## 2011-07-15 VITALS — BP 142/86 | HR 83 | Temp 98.9°F | Wt 179.6 lb

## 2011-07-15 DIAGNOSIS — C50919 Malignant neoplasm of unspecified site of unspecified female breast: Secondary | ICD-10-CM

## 2011-07-15 DIAGNOSIS — Z171 Estrogen receptor negative status [ER-]: Secondary | ICD-10-CM

## 2011-07-15 DIAGNOSIS — C50319 Malignant neoplasm of lower-inner quadrant of unspecified female breast: Secondary | ICD-10-CM

## 2011-07-15 NOTE — Telephone Encounter (Signed)
GAVE PATIENT APPOINTMENT FOR 07-2012

## 2011-07-15 NOTE — Progress Notes (Signed)
err

## 2011-07-15 NOTE — Progress Notes (Signed)
Hematology and Oncology Follow Up Visit  Gabrielle Hawkins [id number] March 18, 1966 45 y.o. 07/15/2011 4:22 PM   DIAGNOSIS: History of node negative ER negative PR negative breast cancer status post FDC x6 completed 01/17/2005, ration therapy completed August 2006. History of chronic fatigue and  peripheral neuropathy  Encounter Diagnosis  Name Primary?  . 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 She continues taking Neurontin a total of 100 mg a day. Her medications are the same. He is much improved. He levels improved.  Medications: I have reviewed the patient's current medications.  Allergies: Allergies not on file  Past Medical History, Surgical history, Social history, and Family History were reviewed and updated.  Review of Systems: Constitutional:  Negative for fever, chills, night sweats, anorexia, weight loss, pain. Cardiovascular: no chest pain or dyspnea on exertion Respiratory: no cough, shortness of breath, or wheezing Neurological: negative Dermatological: negative ENT: negative Skin Gastrointestinal: negative Genito-Urinary: negative Hematological and Lymphatic: negative Breast: negative Musculoskeletal: negative Remaining ROS negative.  Physical Exam:  Blood pressure 142/86, pulse 83, temperature 98.9 F (37.2 C), weight 179 lb 9.6 oz (81.466 kg).  ECOG: 0   General appearance: alert and cooperative  HEENT:  Sclerae anicteric, conjunctivae pink.  Oropharynx clear.  No mucositis or candidiasis.  Nodes:  No cervical, supraclavicular, or axillary lymphadenopathy palpated.  Breast Exam:  Right breast is benign.  No masses, discharge, skin change, or nipple inversion.  Left breast is benign.  No masses, discharge, skin change, or nipple inversion..  Lungs:  Clear to  auscultation bilaterally.  No crackles, rhonchi, or wheezes.  Heart:  Regular rate and rhythm.  Abdomen:  Soft, nontender.  Positive bowel sounds.  No organomegaly or masses palpated.  Musculoskeletal:  No focal spinal tenderness to palpation.  Extremities:  Benign.  No peripheral edema or cyanosis.  Skin:  Benign.  Neuro:  Nonfocal.  Lab Results: Lab Results  Component Value Date   WBC 3.9 WHITE COUNT CONFIRMED ON SMEAR* 01/20/2010   HGB 13.8 07/08/2011   HCT 41.2 07/08/2011   MCV 90.2 07/08/2011   PLT 193 07/08/2011     Chemistry      Component Value Date/Time   NA 139 07/08/2011 1204   NA 139 07/08/2011 1204   NA 139 07/08/2011 1204   NA 139 07/08/2011 1204   NA 139 07/08/2011 1204   K 3.9 07/08/2011 1204   K 3.9 07/08/2011 1204   K 3.9 07/08/2011 1204   K 3.9 07/08/2011 1204   K 3.9 07/08/2011 1204   CL 105 07/08/2011 1204   CL 105 07/08/2011 1204   CL 105 07/08/2011 1204   CL 105 07/08/2011 1204   CL 105 07/08/2011 1204   CO2 23 07/08/2011 1204   CO2 23 07/08/2011 1204   CO2 23 07/08/2011 1204   CO2 23 07/08/2011 1204   CO2 23 07/08/2011 1204   BUN 14 07/08/2011 1204   BUN 14 07/08/2011 1204   BUN 14 07/08/2011 1204   BUN 14 07/08/2011 1204   BUN 14 07/08/2011 1204   CREATININE 0.87 07/08/2011 1204   CREATININE 0.87 07/08/2011 1204   CREATININE 0.87 07/08/2011 1204   CREATININE 0.87 07/08/2011 1204   CREATININE 0.87 07/08/2011 1204      Component Value Date/Time   CALCIUM 9.4  07/08/2011 1204   CALCIUM 9.4 07/08/2011 1204   CALCIUM 9.4 07/08/2011 1204   CALCIUM 9.4 07/08/2011 1204   CALCIUM 9.4 07/08/2011 1204   ALKPHOS 80 07/08/2011 1204   ALKPHOS 80 07/08/2011 1204   ALKPHOS 80 07/08/2011 1204   ALKPHOS 80 07/08/2011 1204   ALKPHOS 80 07/08/2011 1204   AST 12 07/08/2011 1204   AST 12 07/08/2011 1204   AST 12 07/08/2011 1204   AST 12 07/08/2011 1204   AST 12 07/08/2011 1204   ALT 10 07/08/2011 1204   ALT 10 07/08/2011 1204   ALT 10 07/08/2011 1204   ALT 10 07/08/2011 1204   ALT 10 07/08/2011 1204    BILITOT 0.4 07/08/2011 1204   BILITOT 0.4 07/08/2011 1204   BILITOT 0.4 07/08/2011 1204   BILITOT 0.4 07/08/2011 1204   BILITOT 0.4 07/08/2011 1204       Radiological Studies:  No results found.   IMPRESSIONS AND PLAN: A 45 y.o. female with a history of breast cancer over 5 years ago. No clinical evidence of recurrence. I recommended that she continue to take vitamin D which he just restarted recently. I've also recommended that she attempt to cut back on her Neurontin to see if this will make her subjective feeling of being cold better. Plan to see her back in followup in 6 months time.     Spent more than half the time coordinating care.    Densil Ottey 11/9/20124:22 PM

## 2011-09-12 ENCOUNTER — Other Ambulatory Visit: Payer: Self-pay

## 2011-09-12 DIAGNOSIS — Z853 Personal history of malignant neoplasm of breast: Secondary | ICD-10-CM

## 2011-09-12 MED ORDER — ZOLPIDEM TARTRATE 5 MG PO TABS: 5.0000 mg | ORAL_TABLET | Freq: Every evening | ORAL | Status: DC | PRN

## 2011-09-12 NOTE — Telephone Encounter (Signed)
Received message from pt stating that she would like refill on her Lorrin Mais, as she had not been sleeping well.  Ok per Dr. Truddie Coco.  Message left for pt that this had been sent to [facility] per her request.

## 2011-09-21 ENCOUNTER — Other Ambulatory Visit: Payer: Self-pay | Admitting: *Deleted

## 2011-09-21 NOTE — Telephone Encounter (Signed)
Pt. Called and her Lorrin Mais was to be called in to [facility] but it is not there.  Note in epic is from 09/12/11 for refill.  Spoke with pharmacist, Aaron Edelman and this medication can not be sent electronically.  So gave him a verbal on refill in epic.   Called pt. And let her know.

## 2011-10-03 ENCOUNTER — Ambulatory Visit
Admission: RE | Admit: 2011-10-03 | Discharge: 2011-10-03 | Disposition: A | Discharge status: Home / Self Care | Payer: 59 | Source: Ambulatory Visit | Attending: Physician Assistant | Admitting: Physician Assistant

## 2011-10-03 ENCOUNTER — Telehealth: Payer: Self-pay | Admitting: Oncology

## 2011-10-03 ENCOUNTER — Telehealth: Payer: Self-pay | Admitting: *Deleted

## 2011-10-03 ENCOUNTER — Ambulatory Visit (HOSPITAL_BASED_OUTPATIENT_CLINIC_OR_DEPARTMENT_OTHER): Payer: 59 | Admitting: Physician Assistant

## 2011-10-03 VITALS — BP 144/94 | HR 94 | Temp 98.1°F | Ht 68.5 in | Wt 173.3 lb

## 2011-10-03 DIAGNOSIS — C50911 Malignant neoplasm of unspecified site of right female breast: Secondary | ICD-10-CM

## 2011-10-03 DIAGNOSIS — Z853 Personal history of malignant neoplasm of breast: Secondary | ICD-10-CM

## 2011-10-03 DIAGNOSIS — N644 Mastodynia: Secondary | ICD-10-CM

## 2011-10-03 NOTE — Progress Notes (Signed)
Hematology and Oncology Follow Up Visit  Gabrielle Hawkins [id number] 1966/03/04 46 y.o. 10/03/2011    HPI:    Gabrielle Hawkins is a 46 year old [place] woman with a history of an ER PR negative HER-2 negative, node negative right breast carcinoma for which she received adjuvant chemotherapy following a right lumpectomy with sentinel node dissection. Radiation was completed August of 2006. She has been followed with observation only since.  2. Chronic fatigue  3. History of peripheral neuropathy  Interim History:      Gabrielle Hawkins is seen today between scheduled clinic visit after she contact our office this morning stating that she has found a palpable sore area in the lateral aspect of her right breast. It is worrying her quite a bit, and she was for assessment. She notes this area started hurting a few days ago correlating with one her right arm started hurting. She denies any lymphedema or redness of the arm or breast. She did appreciate some  'dry itchy skin" over her right lumpectomy scar would seem to correlate with when her arm and breast started hurting.    She denies any known trauma to this region, she has not recently started any workout regimens or weight lifting. She denies any fevers or chills no shortness of breath or chest pain. Her last diagnostic mammogram was performed August 2012 and all was well.   Medications:   I have reviewed the patient's current medications.  Current Outpatient Prescriptions  Medication Sig Dispense Refill  . gabapentin (NEURONTIN) 300 MG capsule Take 300 mg by mouth 2 (two) times daily. 1/2 tab bid       . zolpidem (AMBIEN) 5 MG tablet Take 1 tablet (5 mg total) by mouth at bedtime as needed for sleep.  30 tablet  2    Allergies: Not on File  Physical Exam: Filed Vitals:   10/03/11 1102  BP: 144/94  Pulse: 94  Temp: 98.1 F (36.7 C)   Nodes:  No cervical, supraclavicular, or axillary lymphadenopathy palpated.  Breast Exam: The right  breast was examined, she is exquisitely tender over the lateral aspect of the right breast, there is definitely some thickening in this region but no evidence of any skin changes. She is also quite tender in the inferior aspect of her axillary region, this is actually below the surgical scarring. No evidence of any definable mass within the axilla.  No palpable cords within the upper arm.  Lungs:  Clear to auscultation bilaterally.  No crackles, rhonchi, or wheezes.   Heart:  Regular rate and rhythm.     Lab Results: Lab Results  Component Value Date   WBC 4.8 07/08/2011   HGB 13.8 07/08/2011   HCT 41.2 07/08/2011   MCV 90.2 07/08/2011   PLT 193 07/08/2011   NEUTROABS 2.7 07/08/2011     Chemistry      Component Value Date/Time   NA 139 07/08/2011 1204   NA 139 07/08/2011 1204   NA 139 07/08/2011 1204   NA 139 07/08/2011 1204   NA 139 07/08/2011 1204   NA 139 07/08/2011 1204   NA 139 07/08/2011 1204   NA 139 07/08/2011 1204   K 3.9 07/08/2011 1204   K 3.9 07/08/2011 1204   K 3.9 07/08/2011 1204   K 3.9 07/08/2011 1204   K 3.9 07/08/2011 1204   K 3.9 07/08/2011 1204   K 3.9 07/08/2011 1204   K 3.9 07/08/2011 1204   CL 105 07/08/2011 1204   CL  105 07/08/2011 1204   CL 105 07/08/2011 1204   CL 105 07/08/2011 1204   CL 105 07/08/2011 1204   CL 105 07/08/2011 1204   CL 105 07/08/2011 1204   CL 105 07/08/2011 1204   CO2 23 07/08/2011 1204   CO2 23 07/08/2011 1204   CO2 23 07/08/2011 1204   CO2 23 07/08/2011 1204   CO2 23 07/08/2011 1204   CO2 23 07/08/2011 1204   CO2 23 07/08/2011 1204   CO2 23 07/08/2011 1204   BUN 14 07/08/2011 1204   BUN 14 07/08/2011 1204   BUN 14 07/08/2011 1204   BUN 14 07/08/2011 1204   BUN 14 07/08/2011 1204   BUN 14 07/08/2011 1204   BUN 14 07/08/2011 1204   BUN 14 07/08/2011 1204   CREATININE 0.87 07/08/2011 1204   CREATININE 0.87 07/08/2011 1204   CREATININE 0.87 07/08/2011 1204   CREATININE 0.87 07/08/2011 1204   CREATININE 0.87 07/08/2011 1204   CREATININE 0.87 07/08/2011 1204    CREATININE 0.87 07/08/2011 1204   CREATININE 0.87 07/08/2011 1204      Component Value Date/Time   CALCIUM 9.4 07/08/2011 1204   CALCIUM 9.4 07/08/2011 1204   CALCIUM 9.4 07/08/2011 1204   CALCIUM 9.4 07/08/2011 1204   CALCIUM 9.4 07/08/2011 1204   CALCIUM 9.4 07/08/2011 1204   CALCIUM 9.4 07/08/2011 1204   CALCIUM 9.4 07/08/2011 1204   ALKPHOS 80 07/08/2011 1204   ALKPHOS 80 07/08/2011 1204   ALKPHOS 80 07/08/2011 1204   ALKPHOS 80 07/08/2011 1204   ALKPHOS 80 07/08/2011 1204   ALKPHOS 80 07/08/2011 1204   ALKPHOS 80 07/08/2011 1204   ALKPHOS 80 07/08/2011 1204   AST 12 07/08/2011 1204   AST 12 07/08/2011 1204   AST 12 07/08/2011 1204   AST 12 07/08/2011 1204   AST 12 07/08/2011 1204   AST 12 07/08/2011 1204   AST 12 07/08/2011 1204   AST 12 07/08/2011 1204   ALT 10 07/08/2011 1204   ALT 10 07/08/2011 1204   ALT 10 07/08/2011 1204   ALT 10 07/08/2011 1204   ALT 10 07/08/2011 1204   ALT 10 07/08/2011 1204   ALT 10 07/08/2011 1204   ALT 10 07/08/2011 1204   BILITOT 0.4 07/08/2011 1204   BILITOT 0.4 07/08/2011 1204   BILITOT 0.4 07/08/2011 1204   BILITOT 0.4 07/08/2011 1204   BILITOT 0.4 07/08/2011 1204   BILITOT 0.4 07/08/2011 1204   BILITOT 0.4 07/08/2011 1204   BILITOT 0.4 07/08/2011 1204        Radiological Studies: 05/05/11 DIGITAL DIAGNOSTIC BILATERAL MAMMOGRAM WITH CAD  Comparison: 05/03/2010, 05/01/2009 and 04/30/2008  Findings: Exam demonstrates heterogeneously dense fibroglandular  tissue. There are stable post lumpectomy changes of the inner mid  right breast. Remainder of the exam is unchanged.  Mammographic images were processed with CAD.  IMPRESSION:  Stable post lumpectomy changes of the right breast.  Recommendations: Recommend continued annual bilateral diagnostic  mammographic evaluation.  BI-RADS CATEGORY 2: Benign finding(s).     Assessment:     A 46 year old [place] woman with a history of a node negative, triple negative right breast carcinoma for which  she underwent a right lumpectomy with sentinel node dissection followed by adjuvant chemotherapy completed 01/17/2005, radiation therapy completed August 2006.  She now has pain in thickening in the lateral aspect of the right breast and in the right axilla, which I highly suspect is secondary to a torn adhesion, but given the fact  that the patient has very thick breast tissue, I will refer her for a unilateral diagnostic mammogram and ultrasound of the right breast.  Case to be reviewed with Dr. Eston Esters upon his return.  Plan:      Gabrielle Hawkins will be referred to the [facility] for unilateral diagnostic mammogram of the right breast and subsequent ultrasound of the right breast. At this time, she is scheduled for routine follow up with Dr. Truddie Coco in November of 2013, but we will see her sooner if the above studies show evidence of possible recurrence or if the need should arise.    This plan was reviewed with the patient, who voices understanding and agreement.  She knows to call with any changes or problems.    Ezequias Lard T, PA-C 10/03/2011

## 2011-10-03 NOTE — Telephone Encounter (Signed)
Per note from nurse added patient to christine's schedule at 10:15am

## 2011-10-03 NOTE — Telephone Encounter (Signed)
gve the pt her mammo/us appt for today at the [facility] center

## 2011-10-05 ENCOUNTER — Emergency Department (HOSPITAL_COMMUNITY)
Admission: EM | Admit: 2011-10-05 | Discharge: 2011-10-05 | Disposition: A | Discharge status: Home / Self Care | Payer: 59 | Attending: Emergency Medicine | Admitting: Emergency Medicine

## 2011-10-05 ENCOUNTER — Encounter (HOSPITAL_COMMUNITY): Payer: Self-pay

## 2011-10-05 DIAGNOSIS — R071 Chest pain on breathing: Secondary | ICD-10-CM | POA: Insufficient documentation

## 2011-10-05 DIAGNOSIS — Z853 Personal history of malignant neoplasm of breast: Secondary | ICD-10-CM | POA: Insufficient documentation

## 2011-10-05 DIAGNOSIS — M7989 Other specified soft tissue disorders: Secondary | ICD-10-CM

## 2011-10-05 DIAGNOSIS — M79609 Pain in unspecified limb: Secondary | ICD-10-CM | POA: Insufficient documentation

## 2011-10-05 DIAGNOSIS — R0789 Other chest pain: Secondary | ICD-10-CM

## 2011-10-05 DIAGNOSIS — M25519 Pain in unspecified shoulder: Secondary | ICD-10-CM | POA: Insufficient documentation

## 2011-10-05 DIAGNOSIS — Z923 Personal history of irradiation: Secondary | ICD-10-CM | POA: Insufficient documentation

## 2011-10-05 DIAGNOSIS — M79601 Pain in right arm: Secondary | ICD-10-CM

## 2011-10-05 DIAGNOSIS — M25511 Pain in right shoulder: Secondary | ICD-10-CM

## 2011-10-05 HISTORY — DX: Polyneuropathy, unspecified: G62.9

## 2011-10-05 MED ORDER — PREDNISONE 20 MG PO TABS: ORAL_TABLET | ORAL | Status: AC

## 2011-10-05 MED ORDER — ONDANSETRON 8 MG PO TBDP
8.0000 mg | ORAL_TABLET | Freq: Once | ORAL | Status: AC
  Administered 2011-10-05: 8 mg via ORAL
  Filled 2011-10-05: qty 1

## 2011-10-05 MED ORDER — ONDANSETRON HCL 8 MG PO TABS: 8.0000 mg | ORAL_TABLET | Freq: Three times a day (TID) | ORAL | Status: AC | PRN

## 2011-10-05 MED ORDER — IBUPROFEN 200 MG PO TABS
600.0000 mg | ORAL_TABLET | Freq: Once | ORAL | Status: AC
  Administered 2011-10-05: 600 mg via ORAL
  Filled 2011-10-05: qty 3

## 2011-10-05 MED ORDER — HYDROCODONE-ACETAMINOPHEN 5-325 MG PO TABS
2.0000 | ORAL_TABLET | Freq: Once | ORAL | Status: AC
  Administered 2011-10-05: 1 via ORAL
  Filled 2011-10-05: qty 2

## 2011-10-05 MED ORDER — HYDROCODONE-ACETAMINOPHEN 5-500 MG PO TABS: 1.0000 | ORAL_TABLET | Freq: Four times a day (QID) | ORAL | Status: AC | PRN

## 2011-10-05 NOTE — ED Notes (Signed)
Patient reports that she has been having right breast and right arm pain x 4 days. Patient has a  History of lumpectomy 7 years ago.. Patient rates pain 10/10. Right radial pulse present.

## 2011-10-05 NOTE — ED Notes (Signed)
States that she has been seen at the [facility] regarding breast and arm pain. States that she had a lumpectomy 7 years ago and lymph nodes removed from the right axilla. States that she was told that the biopsies were negative. States that she has had this arm and breast pain for the last week with progression leading to her being unable to lift her arm. Also states that she has swelling to the right arm

## 2011-10-05 NOTE — ED Provider Notes (Signed)
History     CSN: [id number]  Arrival date & time 10/05/11  4270   First MD Initiated Contact with Patient 10/05/11 0845      Chief Complaint  Patient presents with  . Arm Pain    right    (Consider location/radiation/quality/duration/timing/severity/associated sxs/prior treatment) Patient is a 46 y.o. female presenting with arm pain.  Arm Pain Pertinent negatives include no chest pain, no abdominal pain, no headaches and no shortness of breath.  pt w hx right breast ca, s/p lumpectomy, l/n dissection, radiation/chemo 2006 presents w right upper arm pain/?swelling, and right axillary area and right superior/lateral chest wall pain for past 3-4 days. Pain constant, dull, non radiating, worse w palpation, movement shoulder arm, raising arm. No hx same pain. Denies neck or back pain. No radicular pain. Pain is not pleuritic. No ant cp or sob. No abd pain. Denies hx same pain. No recent chest/shoulder/arm strain or injury. No arm numbness/weakness. No fever or chills. No cough or uri c/o. No leg pain or swelling. No dvt or pe hx. No skin changes, erythema or rash to area of pain. No focal swelling/abscess.   Past Medical History  Diagnosis Date  . Neuropathy     Past Surgical History  Procedure Date  . Breast surgery   . Abdominal hysterectomy   . Tonsillectomy   . Myomectomy     Family History  Problem Relation Age of Onset  . Cancer Mother   . Heart failure Father     History  Substance Use Topics  . Smoking status: Never Smoker   . Smokeless tobacco: Never Used  . Alcohol Use: No    OB History    Grav Para Term Preterm Abortions TAB SAB Ect Mult Living                  Review of Systems  Constitutional: Negative for fever and chills.  HENT: Negative for neck pain and neck stiffness.   Eyes: Negative for redness.  Respiratory: Negative for cough and shortness of breath.   Cardiovascular: Negative for chest pain and leg swelling.  Gastrointestinal: Negative for  abdominal pain.  Genitourinary: Negative for flank pain.  Musculoskeletal: Negative for back pain.  Skin: Negative for rash.  Neurological: Negative for headaches.  Hematological: Does not bruise/bleed easily.  Psychiatric/Behavioral: Negative for confusion.    Allergies  Ampicillin; Compazine; Imitrex; Reglan; and Effexor  Home Medications   Current Outpatient Rx  Name Route Sig Dispense Refill  . GABAPENTIN 300 MG PO CAPS Oral Take 300 mg by mouth 2 (two) times daily. 1/2 tab bid     . ZOLPIDEM TARTRATE 5 MG PO TABS Oral Take 1 tablet (5 mg total) by mouth at bedtime as needed for sleep. 30 tablet 2    BP 142/112  Pulse 97  Temp(Src) 98.8 F (37.1 C) (Oral)  Resp 19  SpO2 100%  Physical Exam  Nursing note and vitals reviewed. Constitutional: She appears well-developed and well-nourished. No distress.  Eyes: Conjunctivae are normal. No scleral icterus.  Neck: Normal range of motion. Neck supple. No tracheal deviation present.       No pain w rom cspine, c/t spine non tender.   Cardiovascular: Normal rate, regular rhythm, normal heart sounds and intact distal pulses.  Exam reveals no gallop and no friction rub.   No murmur heard. Pulmonary/Chest: Effort normal and breath sounds normal. No respiratory distress.  Abdominal: Soft. Normal appearance. She exhibits no distension. There is no tenderness.  Musculoskeletal: Normal range of motion. She exhibits no edema.       Good rom right shoulder. w abducting at shoulder or lifting arm patient notes increased pain. Tenderness to medial aspect right upper arm, right axilla, and right superior/lateral chest wall. No skin changes, erythema, rash, induration or abscess. No mass. No right breast or axillary mass felt. No crepitus. No focal/point bony tenderness to chest wall, shoulder or arm. Radial pulse 2+.   Neurological: She is alert.       RUE nvi. Steady gait   Skin: Skin is warm and dry. No rash noted.  Psychiatric: She has a  normal mood and affect.    ED Course  Procedures (including critical care time)  Labs Reviewed - No data to display US Breast Right  10/03/2011  *RADIOLOGY REPORT*  Clinical Data:  The patient has a history of a previous malignant lumpectomy of the right breast in 2006.  She has undergone previous chemotherapy and radiation therapy.  She has now developed pain within the lateral right breast and lower right axillary regions.  DIGITAL DIAGNOSTIC RIGHT BREAST MAMMOGRAM WITH CAD AND RIGHT BREAST ULTRASOUND:  Comparison:  05/05/2011, 05/03/2010, 09/01/2009, 05/01/2009, 04/30/2008 mammograms from the [facility].  Findings:  There is a heterogeneously dense right breast parenchymal pattern which is stable.  There is no mass, distortion, or worrisome calcification. Mammographic images were processed with CAD.  On physical exam, there is no discrete palpable abnormality within the lateral right breast or right axilla.  The patient is very tender on physical examination of the lateral right breast. There is a well healed incision within the medial right breast related to the patient's previous lumpectomy.  Ultrasound is performed, showing normal-appearing fibroglandular tissue within the lateral right breast and right axilla.  There is no evidence for a mass or adenopathy.  IMPRESSION: Stable right breast parenchymal pattern.  No findings worrisome for developing malignancy.  Chest CT scan or MRI of the breast may be helpful in further evaluation to better visualize the chest wall structures (if felt to be clinically indicated).  Recommend bilateral diagnostic mammography in August 2013.  BI-RADS CATEGORY 1:  Negative.  Original Report Authenticated By: Altamese Cabal, M.D.   Mm Digital Diagnostic Unilat R  10/03/2011  *RADIOLOGY REPORT*  Clinical Data:  The patient has a history of a previous malignant lumpectomy of the right breast in 2006.  She has undergone previous chemotherapy and radiation therapy.   She has now developed pain within the lateral right breast and lower right axillary regions.  DIGITAL DIAGNOSTIC RIGHT BREAST MAMMOGRAM WITH CAD AND RIGHT BREAST ULTRASOUND:  Comparison:  05/05/2011, 05/03/2010, 09/01/2009, 05/01/2009, 04/30/2008 mammograms from the [place].  Findings:  There is a heterogeneously dense right breast parenchymal pattern which is stable.  There is no mass, distortion, or worrisome calcification. Mammographic images were processed with CAD.  On physical exam, there is no discrete palpable abnormality within the lateral right breast or right axilla.  The patient is very tender on physical examination of the lateral right breast. There is a well healed incision within the medial right breast related to the patient's previous lumpectomy.  Ultrasound is performed, showing normal-appearing fibroglandular tissue within the lateral right breast and right axilla.  There is no evidence for a mass or adenopathy.  IMPRESSION: Stable right breast parenchymal pattern.  No findings worrisome for developing malignancy.  Chest CT scan or MRI of the breast may be helpful in further evaluation to better visualize the  chest wall structures (if felt to be clinically indicated).  Recommend bilateral diagnostic mammography in August 2013.  BI-RADS CATEGORY 1:  Negative.  Original Report Authenticated By: Altamese Cabal, M.D.       MDM  Reviewed recent heme/onc clinic notes and ultrasound results. Pt has taken no meds today, has ride, didn't drive. Motrin po, vicodin po. Vascular doppler.   Recent heme/onc note, ?torn adhesion as possible cause pain. Radiology results reviewed, they ?whether ct or mri would be helpful to further eval chest wall structures.    After vicodin, pt w nausea. zofran po.  Vascular doppler neg for dvt.   Discussed pt with her oncologist, Dr Truddie Coco, including current symptoms, recent u/s and todays vascular doppler.  He states pt well known to him, that this has  been recurrent issue for pt, that has had previously referred to specialty f/u for same, for same, that cause symptoms not entirely clear, ?neuralgia, ?other etiol, but for [place] to try pred taper, pain control, and that he will follow up in clinic. Also discussed whether they want further imaging ordered from ED (i.e. Either MR or CT) - states no other ED testing needed, he will f/u w pt and discuss any further testing with her then.      Mirna Mires, MD 10/05/11 1120

## 2011-10-05 NOTE — Progress Notes (Signed)
right:  No evidence of DVT or superficial thrombosis.    Tinita Brooker, [place] D., RVS 10/05/2011 10:49 AM

## 2011-10-05 NOTE — ED Notes (Signed)
MD at bedside. 

## 2011-10-07 ENCOUNTER — Ambulatory Visit (HOSPITAL_BASED_OUTPATIENT_CLINIC_OR_DEPARTMENT_OTHER): Payer: 59 | Admitting: Oncology

## 2011-10-07 ENCOUNTER — Telehealth: Payer: Self-pay | Admitting: Oncology

## 2011-10-07 VITALS — BP 127/87 | HR 108 | Temp 97.8°F | Ht 68.5 in | Wt 173.6 lb

## 2011-10-07 DIAGNOSIS — G8929 Other chronic pain: Secondary | ICD-10-CM

## 2011-10-07 DIAGNOSIS — N6459 Other signs and symptoms in breast: Secondary | ICD-10-CM

## 2011-10-07 DIAGNOSIS — C50919 Malignant neoplasm of unspecified site of unspecified female breast: Secondary | ICD-10-CM

## 2011-10-07 DIAGNOSIS — Z9221 Personal history of antineoplastic chemotherapy: Secondary | ICD-10-CM

## 2011-10-07 DIAGNOSIS — Z853 Personal history of malignant neoplasm of breast: Secondary | ICD-10-CM

## 2011-10-07 NOTE — Telephone Encounter (Signed)
gve the pt her pet scan appt. Pt is aware she will be notified regarding the lymphedema appt. lmonvm of the lymphedema regarding this pt needing someone to contact her with an appt.

## 2011-10-07 NOTE — Progress Notes (Signed)
Hematology and Oncology Follow Up Visit  Gabrielle Hawkins [id number] 1966-05-04 46 y.o. 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 Radiation was completed August of 2006. She has been followed with observation only since.  2. Chronic fatigue  3. History of peripheral neuropathy  Interim History:    kim is here for short term f/u. She was seen last week  For breast chest pain had a mammogram, u/s which were negative. She was seen in the ER yesterdat and given vicodin which she didn't tolerate. She feels her lt arm was swollen . She was given a medrol dose pak which she started but it is too early to say if this is working.she is despondent.   Medications:   I have reviewed the patient's current medications.  Current Outpatient Prescriptions  Medication Sig Dispense Refill  . albuterol (PROVENTIL HFA;VENTOLIN HFA) 108 (90 BASE) MCG/ACT inhaler Inhale 2 puffs into the lungs every 6 (six) hours as needed. For shortness of breath.      . cholecalciferol (VITAMIN D) 1000 UNITS tablet Take 1,000 Units by mouth daily.      . Cyanocobalamin (VITAMIN B-12 IJ) Inject 1,000 mcg as directed every 30 (thirty) days.      . cyclobenzaprine (FLEXERIL) 5 MG tablet Take 5 mg by mouth 3 (three) times daily as needed. For joint pain.      [name] gabapentin (NEURONTIN) 300 MG capsule Take 300 mg by mouth 2 (two) times daily.       [name] ibuprofen (ADVIL,MOTRIN) 200 MG tablet Take 400 mg by mouth every 8 (eight) hours as needed. For pain.      . megestrol (MEGACE) 40 MG tablet Take 20 mg by mouth at bedtime.      . pantoprazole (PROTONIX) 40 MG tablet Take 40 mg by mouth daily.      . predniSONE (DELTASONE) 20 MG tablet 3 po once a day for 2 days, then 2 po once a day for 3 days, then 1 po  once a day for 3 days  15 tablet  0  . zolpidem (AMBIEN) 5 MG tablet Take 1 tablet (5 mg total) by mouth at bedtime as needed for sleep.  30 tablet  2  . HYDROcodone-acetaminophen (VICODIN) 5-500 MG per tablet Take 1-2 tablets by mouth every 6 (six) hours as needed for pain.  15 tablet  0  . ondansetron (ZOFRAN) 8 MG tablet Take 1 tablet (8 mg total) by mouth every 8 (eight) hours as needed for nausea.  8 tablet  0    Allergies:  Allergies  Allergen Reactions  . Ampicillin Hives and Itching  . Compazine (Prochlorperazine Maleate)     "Stroke-like symptoms"  . Imitrex (Sumatriptan Base) Hives and Nausea And Vomiting  . Reglan Hives  . Effexor (Venlafaxine Hydrochloride) Hives and Palpitations    Physical Exam: Filed Vitals:   10/07/11 1030  BP: 127/87  Pulse: 108  Temp: 97.8 F (36.6 C)   Nodes:  No cervical, supraclavicular, or axillary lymphadenopathy palpated.  Breast Exam: The right breast was examined, she is exquisitely tender over the lateral aspect of the right breast, there is definitely some thickening in this region but no evidence of any skin changes. She is also quite tender in the inferior aspect of her axillary  region, this is actually below the surgical scarring. No evidence of any definable mass within the axilla.  No palpable cords within the upper arm.  Lungs:  Clear to auscultation bilaterally.  No crackles, rhonchi, or wheezes.   Heart:  Regular rate and rhythm.     Lab Results: Lab Results  Component Value Date   WBC 4.8 07/08/2011   HGB 13.8 07/08/2011   HCT 41.2 07/08/2011   MCV 90.2 07/08/2011   PLT 193 07/08/2011   NEUTROABS 2.7 07/08/2011     Chemistry      Component Value Date/Time   NA 139 07/08/2011 1204   NA 139 07/08/2011 1204   NA 139 07/08/2011 1204   NA 139 07/08/2011 1204   NA 139 07/08/2011 1204   NA 139 07/08/2011 1204   NA 139 07/08/2011 1204   NA 139 07/08/2011 1204   K 3.9 07/08/2011 1204   K 3.9 07/08/2011 1204   K 3.9 07/08/2011 1204   K  3.9 07/08/2011 1204   K 3.9 07/08/2011 1204   K 3.9 07/08/2011 1204   K 3.9 07/08/2011 1204   K 3.9 07/08/2011 1204   CL 105 07/08/2011 1204   CL 105 07/08/2011 1204   CL 105 07/08/2011 1204   CL 105 07/08/2011 1204   CL 105 07/08/2011 1204   CL 105 07/08/2011 1204   CL 105 07/08/2011 1204   CL 105 07/08/2011 1204   CO2 23 07/08/2011 1204   CO2 23 07/08/2011 1204   CO2 23 07/08/2011 1204   CO2 23 07/08/2011 1204   CO2 23 07/08/2011 1204   CO2 23 07/08/2011 1204   CO2 23 07/08/2011 1204   CO2 23 07/08/2011 1204   BUN 14 07/08/2011 1204   BUN 14 07/08/2011 1204   BUN 14 07/08/2011 1204   BUN 14 07/08/2011 1204   BUN 14 07/08/2011 1204   BUN 14 07/08/2011 1204   BUN 14 07/08/2011 1204   BUN 14 07/08/2011 1204   CREATININE 0.87 07/08/2011 1204   CREATININE 0.87 07/08/2011 1204   CREATININE 0.87 07/08/2011 1204   CREATININE 0.87 07/08/2011 1204   CREATININE 0.87 07/08/2011 1204   CREATININE 0.87 07/08/2011 1204   CREATININE 0.87 07/08/2011 1204   CREATININE 0.87 07/08/2011 1204      Component Value Date/Time   CALCIUM 9.4 07/08/2011 1204   CALCIUM 9.4 07/08/2011 1204   CALCIUM 9.4 07/08/2011 1204   CALCIUM 9.4 07/08/2011 1204   CALCIUM 9.4 07/08/2011 1204   CALCIUM 9.4 07/08/2011 1204   CALCIUM 9.4 07/08/2011 1204   CALCIUM 9.4 07/08/2011 1204   ALKPHOS 80 07/08/2011 1204   ALKPHOS 80 07/08/2011 1204   ALKPHOS 80 07/08/2011 1204   ALKPHOS 80 07/08/2011 1204   ALKPHOS 80 07/08/2011 1204   ALKPHOS 80 07/08/2011 1204   ALKPHOS 80 07/08/2011 1204   ALKPHOS 80 07/08/2011 1204   AST 12 07/08/2011 1204   AST 12 07/08/2011 1204   AST 12 07/08/2011 1204   AST 12 07/08/2011 1204   AST 12 07/08/2011 1204   AST 12 07/08/2011 1204   AST 12 07/08/2011 1204   AST 12 07/08/2011 1204   ALT 10 07/08/2011 1204   ALT 10 07/08/2011 1204   ALT 10 07/08/2011 1204   ALT 10 07/08/2011 1204   ALT 10 07/08/2011 1204   ALT 10 07/08/2011 1204   ALT 10 07/08/2011 1204   ALT 10 07/08/2011 1204   BILITOT 0.4 07/08/2011 1204   BILITOT 0.4  07/08/2011  1204   BILITOT 0.4 07/08/2011 1204   BILITOT 0.4 07/08/2011 1204   BILITOT 0.4 07/08/2011 1204   BILITOT 0.4 07/08/2011 1204   BILITOT 0.4 07/08/2011 1204   BILITOT 0.4 07/08/2011 1204        Radiological Studies: 05/05/11 DIGITAL DIAGNOSTIC BILATERAL MAMMOGRAM WITH CAD  Comparison: 05/03/2010, 05/01/2009 and 04/30/2008  Findings: Exam demonstrates heterogeneously dense fibroglandular  tissue. There are stable post lumpectomy changes of the inner mid  right breast. Remainder of the exam is unchanged.  Mammographic images were processed with CAD.  IMPRESSION:  Stable post lumpectomy changes of the right breast.  Recommendations: Recommend continued annual bilateral diagnostic  mammographic evaluation.  BI-RADS CATEGORY 2: Benign finding(s).     Assessment:     A 46 year old [place] woman with a history of a node negative, triple negative right breast carcinoma for which she underwent a right lumpectomy with sentinel node dissection followed by adjuvant chemotherapy completed 01/17/2005, radiation therapy completed August 2006. Maudie Mercury has had chronic pain off and on for a number of years. I am not sure if this is a manifestation of fibromyalgia. i have suggested she seek out the lymphedema clinic, as a sleeve might be beneficial. i have ordered a PET scan as well. I have given a script for percocet. She is due to come back in may , I can certainly see her earlier ; will plan to notify her of PET results.  This plan was reviewed with the patient, who voices understanding and agreement.  She knows to call with any changes or problems.    Eston Esters, PA-C 10/07/2011

## 2011-10-13 ENCOUNTER — Encounter (HOSPITAL_COMMUNITY)
Admission: RE | Admit: 2011-10-13 | Discharge: 2011-10-13 | Disposition: A | Discharge status: Home / Self Care | Payer: 59 | Source: Ambulatory Visit | Attending: Oncology | Admitting: Oncology

## 2011-10-13 ENCOUNTER — Encounter (HOSPITAL_COMMUNITY): Payer: Self-pay

## 2011-10-13 DIAGNOSIS — C50919 Malignant neoplasm of unspecified site of unspecified female breast: Secondary | ICD-10-CM | POA: Insufficient documentation

## 2011-10-13 DIAGNOSIS — Z923 Personal history of irradiation: Secondary | ICD-10-CM | POA: Insufficient documentation

## 2011-10-13 DIAGNOSIS — Z9221 Personal history of antineoplastic chemotherapy: Secondary | ICD-10-CM | POA: Insufficient documentation

## 2011-10-13 HISTORY — DX: Malignant neoplasm of unspecified site of unspecified female breast: C50.919

## 2011-10-13 MED ORDER — FLUDEOXYGLUCOSE F - 18 (FDG) INJECTION
19.0000 | Freq: Once | INTRAVENOUS | Status: AC | PRN
  Administered 2011-10-13: 19 via INTRAVENOUS

## 2011-10-18 ENCOUNTER — Telehealth: Payer: Self-pay | Admitting: *Deleted

## 2011-10-18 NOTE — Telephone Encounter (Signed)
Pt. Called for results of PET  Done 10/13/11.   Called pt.  No evidence of local breast cancer recurrence or distant metastasis.  She is very relieved and knows to call back if she has further problems.  She sees Dr. Truddie Coco in May.

## 2011-11-04 ENCOUNTER — Ambulatory Visit: Payer: 59 | Attending: Oncology | Admitting: Physical Therapy

## 2011-11-04 DIAGNOSIS — M24519 Contracture, unspecified shoulder: Secondary | ICD-10-CM | POA: Insufficient documentation

## 2011-11-04 DIAGNOSIS — IMO0001 Reserved for inherently not codable concepts without codable children: Secondary | ICD-10-CM | POA: Insufficient documentation

## 2011-11-04 DIAGNOSIS — I890 Lymphedema, not elsewhere classified: Secondary | ICD-10-CM | POA: Insufficient documentation

## 2011-11-09 ENCOUNTER — Encounter: Payer: 59 | Admitting: Physical Therapy

## 2011-11-15 ENCOUNTER — Ambulatory Visit: Payer: 59 | Admitting: Physical Therapy

## 2011-11-18 ENCOUNTER — Encounter: Payer: 59 | Admitting: Physical Therapy

## 2011-11-22 ENCOUNTER — Encounter: Payer: 59 | Admitting: Physical Therapy

## 2011-11-25 ENCOUNTER — Encounter: Payer: 59 | Admitting: Physical Therapy

## 2011-11-29 ENCOUNTER — Encounter: Payer: 59 | Admitting: Physical Therapy

## 2011-12-06 ENCOUNTER — Encounter: Payer: 59 | Admitting: Physical Therapy

## 2011-12-09 ENCOUNTER — Encounter: Payer: 59 | Admitting: Physical Therapy

## 2011-12-30 ENCOUNTER — Encounter: Payer: Self-pay | Admitting: Gynecology

## 2011-12-30 ENCOUNTER — Ambulatory Visit (INDEPENDENT_AMBULATORY_CARE_PROVIDER_SITE_OTHER): Payer: 59 | Admitting: Gynecology

## 2011-12-30 VITALS — BP 130/88 | Ht 67.25 in | Wt 174.0 lb

## 2011-12-30 DIAGNOSIS — C50919 Malignant neoplasm of unspecified site of unspecified female breast: Secondary | ICD-10-CM

## 2011-12-30 DIAGNOSIS — I890 Lymphedema, not elsewhere classified: Secondary | ICD-10-CM

## 2011-12-30 DIAGNOSIS — Z01419 Encounter for gynecological examination (general) (routine) without abnormal findings: Secondary | ICD-10-CM

## 2011-12-30 NOTE — Progress Notes (Signed)
Gabrielle Hawkins 1966-02-01 [id number]   History:    46 y.o.  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 Eston Esters. He has been doing all her lab work and she scheduled to see them next month. Her last mammogram was in August 2012. 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 - NO MALIGNANCY IDENTIFIED.  2. UTERUS, HYSTERECTOMY: - BENIGN CERVIX WITH SQUAMOUS METAPLASIA. - BENIGN SECRETORY PHASE ENDOMETRIUM. - LEIOMYOMATA. - ADENOMYOSIS. - SEROSAL FIBROUS ADHESIONS.  3. RIGHT OVARY AND FALLOPIAN TUBE, RESECTION: - ENDOMETRIOSIS. - TUBOOVARIAN FIBROUS ADHESIONS. - NO MALIGNANCY IDENTIFIED.   patient also has had history vitamin D deficiency she is taking calcium and vitamin D in addition she is taking 1000 units of vitamin D 3 as well. Her last bone density study was in August of 2010 with evidence of decreased bone mineralization (osteopenia). Her last colonoscopy was negative in 2006. She's currently taking half of a 40 mg of Megace which has helped her for her vasomotor symptoms. Patient has been recently diagnosed with right arm lymphedema problems the result of her previous breast surgery and chemotherapy and radiation treatment. Her treatment was completed in 2006.  Past medical history,surgical history, family history and social history were all reviewed and documented in the EPIC chart.  Gynecologic History No LMP recorded. Patient has had a hysterectomy. Contraception: Prior hysterectomy Last Pap: 2012. Results were: normal Last mammogram: 2012. Results were: normal  Obstetric History OB History    Grav Para Term Preterm  Abortions TAB SAB Ect Mult Living   1 0   1  1   0     # Outc Date GA Lbr Len/2nd Wgt Sex Del Anes PTL Lv   1 SAB                ROS:  Was performed and pertinent positives and negatives are included in the history.  Exam: chaperone present  BP 130/88  Ht 5' 7.25" (1.708 m)  Wt 174 lb (78.926 kg)  BMI 27.05 kg/m2  Body mass index is 27.05 kg/(m^2).  General appearance : Well developed well nourished female. No acute distress HEENT: Neck supple, trachea midline, no carotid bruits, no thyroidmegaly Lungs: Clear to auscultation, no rhonchi or wheezes, or rib retractions  Heart: Regular rate and rhythm, no murmurs or gallops Breast:Examined in sitting and supine position were symmetrical in appearance, no palpable masses or tenderness,  no skin retraction, no nipple inversion, no nipple discharge, no skin discoloration, no axillary or supraclavicular lymphadenopathy Abdomen: no palpable masses or tenderness, no rebound or guarding Extremities: no edema or skin discoloration or tenderness  Pelvic:  Bartholin, Urethra, Skene Glands: Within normal limits             Vagina: No gross lesions or discharge  Cervix: Absent Uterus absent  Adnexa  Without masses or tenderness  Anus and perineum  normal   Rectovaginal  normal sphincter tone without palpated masses or tenderness             Hemoccult not done. Rectal exam unremarkable.     Assessment/Plan:  46 y.o. female for annual exam with a history of an ER PR negative HER-2 negative, node negative right breast  carcinoma for which she received adjuvant chemotherapy following a right lumpectomy with sentinel node dissection. Radiation was completed August of 2006 patient been followed by Dr. Eston Esters her medical oncologist and schedule see him in the next few weeks and he will be drawing her blood work. We discussed the new screening guidelines her Pap smear. Patient has had no prior history of abnormal Pap smears in the past. She will  no longer need Pap smears. She will discuss with Dr. Audelia Hives to schedule her next bone density study which is overdue as well as her next colonoscopy. She was encouraged to do her monthly self breast examination and to engage in weightbearing exercises 45 minutes 3 or 4 times a week for osteoporosis prevention.     Terrance Mass MD, 4:41 PM 12/30/2011

## 2011-12-30 NOTE — Patient Instructions (Signed)
Lymphedema Lymphedema is a swelling caused by the abnormal collection of lymph under the skin. The lymph is fluid from the tissues in your body that travels in the lymphatic system. This system is part of the immune system that includes lymph nodes and vessels. The lymph vessels collect and carry the excess fluid, fats, proteins, and wastes from the tissues of the body to the bloodstream. This system also works to clean and remove bacteria and waste products from the body.  Lymphedema occurs when the lymphatic system is blocked. When the lymph vessels or lymph nodes are blocked or damaged, lymph does not drain properly. This causes abnormal build up of lymph. This leads to swelling in the arms or legs. Lymphedema cannot be cured by medicines. But the swelling can be reduced by physical methods. CAUSES  There are two types of Lymphedema. Primary lymphedema is caused by the absence or abnormality of the lymph vessel at birth. It is also known as inherited lymphedema, which occurs rarely. Secondary or acquired lymphedema occurs when the lymph vessel is damaged or blocked. The causes of lymph vessel blockage are:   Skin infection like cellulites.   Infection by parasites (filariasis).   Injury.   Cancer.   Radiation therapy.   Formation of scar tissue.   Surgery.  SYMPTOMS  The symptoms of lymphedema are:  Abnormal swelling of the arm or leg.   Heavy or tight feeling in your arm or leg.   Tight-fitting shoes or rings.   Redness of skin over the affected area.   Limited movement of the affected limb.   Some patients complain about sensitivity to touch and discomfort in the limb(s) affected.  You may not have these symptoms immediately following injury. They usually appear within a few days or even years after injury. Inform your caregiver, if you have any of these symptoms. Early treatment can avoid further problems.  DIAGNOSIS  First, your caregiver will inquire about any surgery you  have had or medicines you are taking. He will then examine you. Your caregiver may order special imaging tests, such as:  Lymphoscintigraphy (a test in which a low dose of radioactive substance is injected to trace the flow of lymph through the lymph vessels).   MRI (imaging tests using sound waves).   Computed tomography (test using special cross-sectional X-rays).   Duplex ultrasound (test using high-frequency sound waves to show the vessels and the blood flow on a screen).   Lymphangiography (special X-ray taken after injecting a contrast dye into the lymph vessel). It is now rarely done.  TREATMENT  Lymphedema can be treated in different ways. Your caregiver will decide the type of treatment depending on the cause. Treatment may include:  Exercise: Special exercises will help fluid move out easily from the affected part. This should be done as per your caregiver's advice.   Manual lymph drainage: Gentle massage of the affected limb makes the fluid to move out more freely.   Compression: Compression stockings or external pump apply pressure over the affected limb. This helps the fluid to move out from the arm or leg. Bandaging can also help to move the fluid out from the affected part.  Your caregiver will decide the method that suits you the most.   Medicines: Your caregiver may prescribe antibiotics, if you have infection.   Surgery: Your caregiver may advise surgery for severe lymphedema. It is reserved for special cases when the patient has difficulty moving. Your surgeon may remove excess tissue from  the arm or leg. This will help to ease your movement. Physical therapy may have to be continued after surgery.  HOME CARE INSTRUCTIONS   Eat a healthy diet.   Exercise regularly as per advice.   Keep the affected area clean and dry.   Use gloves while cooking or gardening.   Protect your skin from cuts.   Use electric razor to shave the affected area.   Keep affected limb  elevated.   Do not wear tight clothes, shoes, or jewels as it may cause the tissue to be strangled.   Do not use heat pads over the affected area.   Do not sit with cross legs.   Do not walk barefoot.   Do not carry weight on the affected arm.   Avoid having blood pressure checked on the affected limb.   The area is very fragile and is predisposed to injury and infection.  SEEK MEDICAL CARE IF:  You continue to have swelling in your limb. SEEK IMMEDIATE MEDICAL CARE IF:   You have high fever.   You have skin rash.   You have chills or sweats.   You have pain or redness.   You have a cut that does not heal.  MAKE SURE YOU:   Understand these instructions.   Will watch your condition.   Will get help right away if you are not doing well or get worse.  Document Released: 06/19/2007 Document Revised: 08/11/2011 Document Reviewed: 05/25/2009 [facility] Patient Information 2012 [place].

## 2012-01-06 ENCOUNTER — Telehealth: Payer: Self-pay | Admitting: *Deleted

## 2012-01-06 NOTE — Telephone Encounter (Signed)
per reschedule list moved patient appointment to 02-22-2012 starting at 1:30pm left voice messaget to inform the patient of the new date andt tiim

## 2012-01-11 ENCOUNTER — Telehealth: Payer: Self-pay | Admitting: *Deleted

## 2012-01-11 NOTE — Telephone Encounter (Signed)
patient confirmed over the phone the new date and time on 02-22-2012 starting at 1:30pm

## 2012-01-12 ENCOUNTER — Ambulatory Visit: Payer: 59 | Admitting: Oncology

## 2012-01-12 ENCOUNTER — Other Ambulatory Visit: Payer: 59 | Admitting: Lab

## 2012-02-04 HISTORY — PX: COLONOSCOPY: SHX174

## 2012-02-22 ENCOUNTER — Other Ambulatory Visit (HOSPITAL_BASED_OUTPATIENT_CLINIC_OR_DEPARTMENT_OTHER): Payer: 59 | Admitting: Lab

## 2012-02-22 ENCOUNTER — Telehealth: Payer: Self-pay | Admitting: Oncology

## 2012-02-22 ENCOUNTER — Ambulatory Visit (HOSPITAL_BASED_OUTPATIENT_CLINIC_OR_DEPARTMENT_OTHER): Payer: 59 | Admitting: Oncology

## 2012-02-22 ENCOUNTER — Other Ambulatory Visit: Payer: Self-pay | Admitting: *Deleted

## 2012-02-22 VITALS — BP 132/85 | HR 96 | Temp 98.1°F | Ht 67.25 in | Wt 176.6 lb

## 2012-02-22 DIAGNOSIS — C50919 Malignant neoplasm of unspecified site of unspecified female breast: Secondary | ICD-10-CM

## 2012-02-22 DIAGNOSIS — Z853 Personal history of malignant neoplasm of breast: Secondary | ICD-10-CM

## 2012-02-22 DIAGNOSIS — E559 Vitamin D deficiency, unspecified: Secondary | ICD-10-CM

## 2012-02-22 DIAGNOSIS — I890 Lymphedema, not elsewhere classified: Secondary | ICD-10-CM

## 2012-02-22 LAB — CBC WITH DIFFERENTIAL/PLATELET
BASO%: 0.8 % (ref 0.0–2.0)
EOS%: 3.2 % (ref 0.0–7.0)
LYMPH%: 31.5 % (ref 14.0–49.7)
MCHC: 33.2 g/dL (ref 31.5–36.0)
MCV: 90.1 fL (ref 79.5–101.0)
MONO%: 7.9 % (ref 0.0–14.0)
Platelets: 182 10*3/uL (ref 145–400)
RBC: 4.34 10*6/uL (ref 3.70–5.45)
WBC: 5.8 10*3/uL (ref 3.9–10.3)

## 2012-02-22 MED ORDER — CYCLOBENZAPRINE HCL 5 MG PO TABS: 5.0000 mg | ORAL_TABLET | Freq: Three times a day (TID) | ORAL | Status: DC | PRN

## 2012-02-22 MED ORDER — ZOLPIDEM TARTRATE 5 MG PO TABS: 5.0000 mg | ORAL_TABLET | Freq: Every evening | ORAL | Status: DC | PRN

## 2012-02-22 MED ORDER — PANTOPRAZOLE SODIUM 40 MG PO TBEC: 40.0000 mg | DELAYED_RELEASE_TABLET | Freq: Every day | ORAL | Status: DC

## 2012-02-22 NOTE — Patient Instructions (Signed)
For questions , please call [phone]

## 2012-02-22 NOTE — Telephone Encounter (Signed)
gve the pt her June 2014 appt calendar

## 2012-02-22 NOTE — Progress Notes (Signed)
Hematology and Oncology Follow Up Visit  Gabrielle Hawkins [id number] 01-Feb-1966 46 y.o. 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 Radiation was completed August of 2006. She has been followed with observation only since.  2. Chronic fatigue  3. History of peripheral neuropathy  Interim History:    Gabrielle Hawkins is doing fairly well. 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 She has no specific complaints she has migratory aches and pains. She continues to have some lymphedema of the right arm and uses a sleeve. She had a mammogram in January. She is due for a followup colonoscopy next week. Her last one was about 6 years ago. In Gen. she's doing pretty well continues on Flexeril. She is using Advil for pain control.  Medications:   I have reviewed the patient's current medications.  Current Outpatient Prescriptions  Medication Sig Dispense Refill  . albuterol (PROVENTIL HFA;VENTOLIN HFA) 108 (90 BASE) MCG/ACT inhaler Inhale 2 puffs into the lungs every 6 (six) hours as needed. For shortness of breath.      . calcium carbonate (OS-CAL) 1250 MG chewable tablet Chew 1 tablet by mouth daily.      . cholecalciferol (VITAMIN D) 1000 UNITS tablet Take 1,000 Units by mouth daily.      . Cyanocobalamin (VITAMIN B-12 IJ) Inject 1,000 mcg as directed every 30 (thirty) days.      [name] gabapentin (NEURONTIN) 300 MG capsule Take 300 mg by mouth 2 (two) times daily.       [name] ibuprofen (ADVIL,MOTRIN) 200 MG tablet Take 400 mg by mouth every 8 (eight) hours as needed. For pain.      . megestrol (MEGACE) 40 MG tablet Take 20 mg by mouth at bedtime.      . meloxicam (MOBIC) 7.5 MG tablet Take 7.5 mg by mouth daily.      [name]  zolpidem (AMBIEN) 5 MG tablet Take 1 tablet (5 mg total) by mouth at bedtime as needed for sleep.  30 tablet  2  . DISCONTD: pantoprazole (PROTONIX) 40 MG tablet Take 40 mg by mouth daily.      . cyclobenzaprine (FLEXERIL) 5 MG tablet Take 1 tablet (5 mg total) by mouth 3 (three) times daily as needed. For joint pain.  90 tablet  0  . pantoprazole (PROTONIX) 40 MG tablet Take 1 tablet (40 mg total) by mouth daily.  30 tablet  3  . DISCONTD: zolpidem (AMBIEN) 5 MG tablet Take 1 tablet (5 mg total) by mouth at bedtime as needed for sleep.  30 tablet  2    Allergies:  Allergies  Allergen Reactions  . Ampicillin Hives and Itching  . Compazine (Prochlorperazine Maleate)     "Stroke-like symptoms"  . Imitrex (Sumatriptan Base) Hives and Nausea And Vomiting  . Metoclopramide Hcl Hives  . Effexor (Venlafaxine Hydrochloride) Hives and Palpitations    Physical Exam: Filed Vitals:   02/22/12 1358  BP: 132/85  Pulse: 96  Temp: 98.1 F (36.7 C)   HEENT:  Sclerae anicteric, conjunctivae pink.  Oropharynx clear.  No mucositis or candidiasis.  Nodes:  No cervical, supraclavicular, or axillary lymphadenopathy palpated.  Breast Exam:  Right breast is benign.  No masses, discharge, skin change, or  nipple inversion.  Left breast is benign.  No masses, discharge, skin change, or nipple inversion..  Lungs:  Clear to auscultation bilaterally.  No crackles, rhonchi, or wheezes.  Heart:  Regular rate and rhythm.  Abdomen:  Soft, nontender.  Positive bowel sounds.  No organomegaly or masses palpated.  Musculoskeletal:  No focal spinal tenderness to palpation.  Extremities:  Benign.  No peripheral edema or cyanosis.  Skin:  Benign.  Neuro:  Nonfocal.      Lab Results: Lab Results  Component Value Date   WBC 5.8 02/22/2012   HGB 13.0 02/22/2012   HCT 39.2 02/22/2012   MCV 90.1 02/22/2012   PLT 182 02/22/2012   NEUTROABS 3.3 02/22/2012     Chemistry      Component Value Date/Time   NA 139 07/08/2011 1204   NA  139 07/08/2011 1204   NA 139 07/08/2011 1204   NA 139 07/08/2011 1204   NA 139 07/08/2011 1204   NA 139 07/08/2011 1204   NA 139 07/08/2011 1204   NA 139 07/08/2011 1204   K 3.9 07/08/2011 1204   K 3.9 07/08/2011 1204   K 3.9 07/08/2011 1204   K 3.9 07/08/2011 1204   K 3.9 07/08/2011 1204   K 3.9 07/08/2011 1204   K 3.9 07/08/2011 1204   K 3.9 07/08/2011 1204   CL 105 07/08/2011 1204   CL 105 07/08/2011 1204   CL 105 07/08/2011 1204   CL 105 07/08/2011 1204   CL 105 07/08/2011 1204   CL 105 07/08/2011 1204   CL 105 07/08/2011 1204   CL 105 07/08/2011 1204   CO2 23 07/08/2011 1204   CO2 23 07/08/2011 1204   CO2 23 07/08/2011 1204   CO2 23 07/08/2011 1204   CO2 23 07/08/2011 1204   CO2 23 07/08/2011 1204   CO2 23 07/08/2011 1204   CO2 23 07/08/2011 1204   BUN 14 07/08/2011 1204   BUN 14 07/08/2011 1204   BUN 14 07/08/2011 1204   BUN 14 07/08/2011 1204   BUN 14 07/08/2011 1204   BUN 14 07/08/2011 1204   BUN 14 07/08/2011 1204   BUN 14 07/08/2011 1204   CREATININE 0.87 07/08/2011 1204   CREATININE 0.87 07/08/2011 1204   CREATININE 0.87 07/08/2011 1204   CREATININE 0.87 07/08/2011 1204   CREATININE 0.87 07/08/2011 1204   CREATININE 0.87 07/08/2011 1204   CREATININE 0.87 07/08/2011 1204   CREATININE 0.87 07/08/2011 1204      Component Value Date/Time   CALCIUM 9.4 07/08/2011 1204   CALCIUM 9.4 07/08/2011 1204   CALCIUM 9.4 07/08/2011 1204   CALCIUM 9.4 07/08/2011 1204   CALCIUM 9.4 07/08/2011 1204   CALCIUM 9.4 07/08/2011 1204   CALCIUM 9.4 07/08/2011 1204   CALCIUM 9.4 07/08/2011 1204   ALKPHOS 80 07/08/2011 1204   ALKPHOS 80 07/08/2011 1204   ALKPHOS 80 07/08/2011 1204   ALKPHOS 80 07/08/2011 1204   ALKPHOS 80 07/08/2011 1204   ALKPHOS 80 07/08/2011 1204   ALKPHOS 80 07/08/2011 1204   ALKPHOS 80 07/08/2011 1204   AST 12 07/08/2011 1204   AST 12 07/08/2011 1204   AST 12 07/08/2011 1204   AST 12 07/08/2011 1204   AST 12 07/08/2011 1204   AST 12 07/08/2011 1204   AST 12 07/08/2011 1204   AST 12 07/08/2011 1204   ALT 10  07/08/2011 1204   ALT 10 07/08/2011 1204   ALT 10 07/08/2011 1204   ALT 10 07/08/2011 1204  ALT 10 07/08/2011 1204   ALT 10 07/08/2011 1204   ALT 10 07/08/2011 1204   ALT 10 07/08/2011 1204   BILITOT 0.4 07/08/2011 1204   BILITOT 0.4 07/08/2011 1204   BILITOT 0.4 07/08/2011 1204   BILITOT 0.4 07/08/2011 1204   BILITOT 0.4 07/08/2011 1204   BILITOT 0.4 07/08/2011 1204   BILITOT 0.4 07/08/2011 1204   BILITOT 0.4 07/08/2011 1204          Assessment:     A 46 year old [place] woman with a history of a node negative, triple negative right breast carcinoma for which she underwent a right lumpectomy with sentinel node dissection followed by adjuvant  I think she is doing better. Her affect is improved. She seems to be less depressed. I will see her in a years time with appropriate imaging studies. I discussed her weight and her BMI and also cautioned her about outcomes leucovorin a to get a blood pressure a little bit better as well. I have scheduled her for blood work as well as a mammogram before she returns.   This plan was reviewed with the patient, who voices understanding and agreement.  She knows to call with any changes or problems.    Keenen Roessner, 02/22/2012

## 2012-02-23 LAB — COMPREHENSIVE METABOLIC PANEL
ALT: 10 U/L (ref 0–35)
AST: 13 U/L (ref 0–37)
Alkaline Phosphatase: 76 U/L (ref 39–117)
Sodium: 140 mEq/L (ref 135–145)
Total Bilirubin: 0.4 mg/dL (ref 0.3–1.2)
Total Protein: 6.8 g/dL (ref 6.0–8.3)

## 2012-04-09 ENCOUNTER — Other Ambulatory Visit (HOSPITAL_COMMUNITY): Payer: Self-pay | Admitting: Neurosurgery

## 2012-04-09 DIAGNOSIS — M545 Low back pain, unspecified: Secondary | ICD-10-CM

## 2012-04-13 ENCOUNTER — Inpatient Hospital Stay (HOSPITAL_COMMUNITY): Admission: RE | Admit: 2012-04-13 | Payer: 59 | Source: Ambulatory Visit

## 2012-04-13 ENCOUNTER — Ambulatory Visit (HOSPITAL_COMMUNITY)
Admission: RE | Admit: 2012-04-13 | Discharge: 2012-04-13 | Disposition: A | Discharge status: Home / Self Care | Payer: 59 | Source: Ambulatory Visit | Attending: Neurosurgery | Admitting: Neurosurgery

## 2012-04-13 DIAGNOSIS — Z853 Personal history of malignant neoplasm of breast: Secondary | ICD-10-CM | POA: Insufficient documentation

## 2012-04-13 DIAGNOSIS — M4120 Other idiopathic scoliosis, site unspecified: Secondary | ICD-10-CM | POA: Insufficient documentation

## 2012-04-13 DIAGNOSIS — M545 Low back pain, unspecified: Secondary | ICD-10-CM

## 2012-04-13 MED ORDER — GADOBENATE DIMEGLUMINE 529 MG/ML IV SOLN
16.0000 mL | Freq: Once | INTRAVENOUS | Status: AC | PRN
  Administered 2012-04-13: 16 mL via INTRAVENOUS

## 2012-05-21 ENCOUNTER — Other Ambulatory Visit: Payer: Self-pay | Admitting: *Deleted

## 2012-05-21 MED ORDER — MELOXICAM 7.5 MG PO TABS: ORAL_TABLET | ORAL | Status: DC

## 2012-05-21 MED ORDER — MEGESTROL ACETATE 40 MG PO TABS: 20.0000 mg | ORAL_TABLET | Freq: Every day | ORAL | Status: DC

## 2012-05-21 MED ORDER — GABAPENTIN 300 MG PO CAPS: 300.0000 mg | ORAL_CAPSULE | Freq: Two times a day (BID) | ORAL | Status: DC

## 2012-05-21 MED ORDER — CYCLOBENZAPRINE HCL 5 MG PO TABS: 5.0000 mg | ORAL_TABLET | Freq: Three times a day (TID) | ORAL | Status: DC | PRN

## 2012-06-29 ENCOUNTER — Ambulatory Visit
Admission: RE | Admit: 2012-06-29 | Discharge: 2012-06-29 | Disposition: A | Discharge status: Home / Self Care | Payer: 59 | Source: Ambulatory Visit | Attending: Oncology | Admitting: Oncology

## 2012-06-29 DIAGNOSIS — Z853 Personal history of malignant neoplasm of breast: Secondary | ICD-10-CM

## 2012-06-29 DIAGNOSIS — E559 Vitamin D deficiency, unspecified: Secondary | ICD-10-CM

## 2012-07-17 ENCOUNTER — Other Ambulatory Visit: Payer: Self-pay | Admitting: *Deleted

## 2012-07-17 DIAGNOSIS — C50919 Malignant neoplasm of unspecified site of unspecified female breast: Secondary | ICD-10-CM

## 2012-07-18 ENCOUNTER — Emergency Department (HOSPITAL_COMMUNITY)
Admission: EM | Admit: 2012-07-18 | Discharge: 2012-07-18 | Disposition: A | Discharge status: Home / Self Care | Payer: 59 | Source: Home / Self Care | Attending: Emergency Medicine | Admitting: Emergency Medicine

## 2012-07-18 ENCOUNTER — Encounter (HOSPITAL_COMMUNITY): Payer: Self-pay

## 2012-07-18 ENCOUNTER — Other Ambulatory Visit: Payer: Self-pay | Admitting: Oncology

## 2012-07-18 DIAGNOSIS — J069 Acute upper respiratory infection, unspecified: Secondary | ICD-10-CM

## 2012-07-18 MED ORDER — CEFDINIR 300 MG PO CAPS: 300.0000 mg | ORAL_CAPSULE | Freq: Two times a day (BID) | ORAL | Status: DC

## 2012-07-18 MED ORDER — FLUTICASONE PROPIONATE 50 MCG/ACT NA SUSP: 2.0000 | Freq: Every day | NASAL | Status: DC

## 2012-07-18 MED ORDER — HYDROCOD POLST-CHLORPHEN POLST 10-8 MG/5ML PO LQCR: 5.0000 mL | Freq: Two times a day (BID) | ORAL | Status: DC | PRN

## 2012-07-18 NOTE — ED Provider Notes (Addendum)
Chief Complaint  Patient presents with  . Facial Pain    History of Present Illness:   Gabrielle Hawkins is a 46 year old female who presents today with a two-day history of left ear pain without any drainage, nasal congestion with clear to yellowish drainage, sinus pressure, fever up to 100, chills, sweats, sore throat, dry cough, abdominal pain. She has a history of asthma and allergies. She is allergic to multiple meds including ampicillin, Imitrex, Effexor, and Compazine. She has a history of breast cancer and lymphedema and is on a number of meds including Neurontin, Mobic, vitamin D, Flexeril, and Zyrtec.  Review of Systems:  Other than noted above, the patient denies any of the following symptoms. Systemic:  No fever, chills, sweats, fatigue, myalgias, headache, or anorexia. Eye:  No redness, pain or drainage. ENT:  No earache, ear congestion, nasal congestion, sneezing, rhinorrhea, sinus pressure, sinus pain, post nasal drip, or sore throat. Lungs:  No cough, sputum production, wheezing, shortness of breath, or chest pain. GI:  No abdominal pain, nausea, vomiting, or diarrhea.  [place]:  Past medical history, family history, social history, meds, and allergies were reviewed.  Physical Exam:   Vital signs:  BP 128/79  Pulse 108  Temp 98.7 F (37.1 C) (Oral)  Resp 16  SpO2 100% General:  Alert, in no distress. Eye:  No conjunctival injection or drainage. Lids were normal. ENT:  TMs and canals were normal, without erythema or inflammation.  Nasal mucosa was clear and uncongested, without drainage.  Mucous membranes were moist.  Pharynx was clear, without exudate or drainage.  There were no oral ulcerations or lesions. Neck:  Supple, no adenopathy, tenderness or mass. Lungs:  No respiratory distress.  Lungs were clear to auscultation, without wheezes, rales or rhonchi.  Breath sounds were clear and equal bilaterally.  Heart:  Regular rhythm, without gallops, murmers or rubs. Skin:  Clear, warm,  and dry, without rash or lesions.  Assessment:  The encounter diagnosis was Viral upper respiratory infection.  Plan:   1.  The following meds were prescribed:   New Prescriptions   CEFDINIR (OMNICEF) 300 MG CAPSULE    Take 1 capsule (300 mg total) by mouth 2 (two) times daily.   CHLORPHENIRAMINE-HYDROCODONE (TUSSIONEX) 10-8 MG/5ML LQCR    Take 5 mLs by mouth every 12 (twelve) hours as needed.   FLUTICASONE (FLONASE) 50 MCG/ACT NASAL SPRAY    Place 2 sprays into the nose daily.   2.  The patient was instructed in symptomatic care and handouts were given. 3.  The patient was told to return if becoming worse in any way, if no better in 3 or 4 days, and given some red flag symptoms that would indicate earlier return.  The patient was told not to get the prescription for antibiotic filled unless her respiratory symptoms had persisted for more than 7 to 10 days.     Harden Mo, MD 07/18/12 [id number]  Harden Mo, MD 07/18/12 [phone]

## 2012-07-18 NOTE — ED Notes (Signed)
C/o facial pain, drainage, ear pain on left side x 1 day

## 2012-07-19 ENCOUNTER — Telehealth: Payer: Self-pay | Admitting: *Deleted

## 2012-07-19 ENCOUNTER — Ambulatory Visit: Payer: 59 | Admitting: Oncology

## 2012-07-19 ENCOUNTER — Other Ambulatory Visit: Payer: 59 | Admitting: Lab

## 2012-07-19 NOTE — Telephone Encounter (Signed)
Patient confirmed over the phone the new date and time on 07-30-2012 starting at 3:30pm with labs

## 2012-07-30 ENCOUNTER — Other Ambulatory Visit (HOSPITAL_BASED_OUTPATIENT_CLINIC_OR_DEPARTMENT_OTHER): Payer: 59 | Admitting: Lab

## 2012-07-30 ENCOUNTER — Ambulatory Visit (HOSPITAL_BASED_OUTPATIENT_CLINIC_OR_DEPARTMENT_OTHER): Payer: 59 | Admitting: Oncology

## 2012-07-30 VITALS — BP 124/83 | HR 94 | Temp 99.2°F | Resp 20 | Ht 67.25 in | Wt 183.3 lb

## 2012-07-30 DIAGNOSIS — C50919 Malignant neoplasm of unspecified site of unspecified female breast: Secondary | ICD-10-CM

## 2012-07-30 DIAGNOSIS — Z853 Personal history of malignant neoplasm of breast: Secondary | ICD-10-CM

## 2012-07-30 DIAGNOSIS — Z171 Estrogen receptor negative status [ER-]: Secondary | ICD-10-CM

## 2012-07-30 DIAGNOSIS — I890 Lymphedema, not elsewhere classified: Secondary | ICD-10-CM

## 2012-07-30 LAB — CBC WITH DIFFERENTIAL/PLATELET
Basophils Absolute: 0 10*3/uL (ref 0.0–0.1)
Eosinophils Absolute: 0.2 10*3/uL (ref 0.0–0.5)
HGB: 13.5 g/dL (ref 11.6–15.9)
NEUT#: 3.6 10*3/uL (ref 1.5–6.5)
RDW: 13.2 % (ref 11.2–14.5)
WBC: 6.7 10*3/uL (ref 3.9–10.3)
lymph#: 2.4 10*3/uL (ref 0.9–3.3)

## 2012-07-30 LAB — COMPREHENSIVE METABOLIC PANEL (CC13)
ALT: 13 U/L (ref 0–55)
AST: 14 U/L (ref 5–34)
CO2: 26 mEq/L (ref 22–29)
Calcium: 9.3 mg/dL (ref 8.4–10.4)
Chloride: 107 mEq/L (ref 98–107)
Sodium: 140 mEq/L (ref 136–145)
Total Bilirubin: 0.4 mg/dL (ref 0.20–1.20)
Total Protein: 7.8 g/dL (ref 6.4–8.3)

## 2012-07-30 LAB — LACTATE DEHYDROGENASE (CC13): LDH: 209 U/L (ref 125–245)

## 2012-07-30 NOTE — Progress Notes (Signed)
Hematology and Oncology Follow Up Visit  Gabrielle Hawkins [id number] [birth year] 46 y.o. 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 Radiation was completed August of 2006. She has been followed with observation only since.  2. Chronic fatigue  3. History of peripheral neuropathy  Interim History:    Gabrielle Hawkins is doing fairly well. 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 She has no specific complaints she has migratory aches and pains. She continues to have some lymphedema of the right arm and uses a sleeve. She had a mammogram in January. She is due for a followup colonoscopy next week. Her last one was about 6 years ago. In Gen. she's doing pretty well continues on Flexeril. She is using Advil for pain control.she recently developed a URTI but isdoing better. Her last mammogram was in October 2013 and no specific abnormalities were noted.  Medications:   I have reviewed the patient's current medications.  Current Outpatient Prescriptions  Medication Sig Dispense Refill  . albuterol (PROVENTIL HFA;VENTOLIN HFA) 108 (90 BASE) MCG/ACT inhaler Inhale 2 puffs into the lungs every 6 (six) hours as needed. For shortness of breath.      . calcium carbonate (OS-CAL) 1250 MG chewable tablet Chew 1 tablet by mouth daily.      . cefdinir (OMNICEF) 300 MG capsule Take 1 capsule (300 mg total) by mouth 2 (two) times daily.  20 capsule  0  . chlorpheniramine-HYDROcodone (TUSSIONEX) 10-8 MG/5ML LQCR Take 5 mLs by mouth every 12 (twelve) hours as needed.  140 mL  0  . cholecalciferol (VITAMIN D) 1000 UNITS tablet Take 1,000 Units by mouth daily.      . Cyanocobalamin (VITAMIN B-12 IJ) Inject 1,000 mcg as directed  every 30 (thirty) days.      . cyclobenzaprine (FLEXERIL) 5 MG tablet Take 1 tablet (5 mg total) by mouth 3 (three) times daily as needed. For joint pain.  90 tablet  1  . fluticasone (FLONASE) 50 MCG/ACT nasal spray Place 2 sprays into the nose daily.  16 g  0  . gabapentin (NEURONTIN) 300 MG capsule Take 1 capsule (300 mg total) by mouth 2 (two) times daily.  60 capsule  1  . ibuprofen (ADVIL,MOTRIN) 200 MG tablet Take 400 mg by mouth every 8 (eight) hours as needed. For pain.      . megestrol (MEGACE) 40 MG tablet Take 0.5 tablets (20 mg total) by mouth at bedtime.  30 tablet  1  . meloxicam (MOBIC) 7.5 MG tablet 1 tab daily  30 tablet  1  . pantoprazole (PROTONIX) 40 MG tablet Take 1 tablet (40 mg total) by mouth daily.  30 tablet  3  . zolpidem (AMBIEN) 5 MG tablet TAKE 1 TABLET BY MOUTH EVERY NIGHT AT BEDTIME AS NEEDED FOR SLEEP  30 tablet  0    Allergies:  Allergies  Allergen Reactions  . Ampicillin Hives and Itching  . Compazine (Prochlorperazine Maleate)     "Stroke-like symptoms"  . Imitrex (Sumatriptan Base) Hives and Nausea And Vomiting  . Metoclopramide Hcl Hives  . Effexor (Venlafaxine Hydrochloride) Hives and Palpitations    Physical Exam: There were no vitals filed for this visit. HEENT:  Sclerae anicteric, conjunctivae  pink.  Oropharynx clear.  No mucositis or candidiasis.  Nodes:  No cervical, supraclavicular, or axillary lymphadenopathy palpated.  Breast Exam:  Right breast is benign.  No masses, discharge, skin change, or nipple inversion.  Left breast is benign.  No masses, discharge, skin change, or nipple inversion..  Lungs:  Clear to auscultation bilaterally.  No crackles, rhonchi, or wheezes.  Heart:  Regular rate and rhythm.  Abdomen:  Soft, nontender.  Positive bowel sounds.  No organomegaly or masses palpated.  Musculoskeletal:  No focal spinal tenderness to palpation.  Extremities:  Benign.  No peripheral edema or cyanosis.  Skin:  Benign.  Neuro:  Nonfocal.       Lab Results: Lab Results  Component Value Date   WBC 6.7 07/30/2012   HGB 13.5 07/30/2012   HCT 40.0 07/30/2012   MCV 88.8 07/30/2012   PLT 178 07/30/2012   NEUTROABS 3.6 07/30/2012     Chemistry      Component Value Date/Time   NA 140 02/22/2012 1332   K 3.8 02/22/2012 1332   CL 106 02/22/2012 1332   CO2 26 02/22/2012 1332   BUN 12 02/22/2012 1332   CREATININE 0.90 02/22/2012 1332      Component Value Date/Time   CALCIUM 9.1 02/22/2012 1332   ALKPHOS 76 02/22/2012 1332   AST 13 02/22/2012 1332   ALT 10 02/22/2012 1332   BILITOT 0.4 02/22/2012 1332          Assessment:     A 46 year old [place] woman with a history of a node negative, triple negative right breast carcinoma for which she underwent a right lumpectomy with sentinel node dissection followed by adjuvant chemotherapy. I think she is doing better. Her affect has  improved. She seems to be less depressed. I will see her in a years time with appropriate imaging studies. She will have a screening mammogram from here on in. This plan was reviewed with the patient, who voices understanding and agreement.  She knows to call with any changes or problems.    [name], 07/30/2012

## 2012-07-31 ENCOUNTER — Telehealth: Payer: Self-pay | Admitting: *Deleted

## 2012-07-31 LAB — CANCER ANTIGEN 27.29: CA 27.29: 22 U/mL (ref 0–39)

## 2012-07-31 NOTE — Telephone Encounter (Signed)
Gave patient appointment for one year appointment

## 2012-09-03 ENCOUNTER — Other Ambulatory Visit: Payer: Self-pay | Admitting: *Deleted

## 2012-09-05 DIAGNOSIS — R42 Dizziness and giddiness: Secondary | ICD-10-CM

## 2012-09-05 HISTORY — DX: Dizziness and giddiness: R42

## 2012-09-19 ENCOUNTER — Telehealth: Payer: Self-pay | Admitting: Oncology

## 2012-09-19 ENCOUNTER — Other Ambulatory Visit: Payer: Self-pay | Admitting: *Deleted

## 2012-09-19 ENCOUNTER — Other Ambulatory Visit: Payer: Self-pay | Admitting: Oncology

## 2012-09-19 DIAGNOSIS — C50919 Malignant neoplasm of unspecified site of unspecified female breast: Secondary | ICD-10-CM

## 2012-09-19 MED ORDER — PANTOPRAZOLE SODIUM 40 MG PO TBEC: 40.0000 mg | DELAYED_RELEASE_TABLET | Freq: Every day | ORAL | Status: DC

## 2012-09-19 MED ORDER — GABAPENTIN 300 MG PO CAPS: 300.0000 mg | ORAL_CAPSULE | Freq: Two times a day (BID) | ORAL | Status: DC

## 2012-09-19 MED ORDER — MELOXICAM 7.5 MG PO TABS: ORAL_TABLET | ORAL | Status: DC

## 2012-09-19 MED ORDER — CYCLOBENZAPRINE HCL 5 MG PO TABS: 5.0000 mg | ORAL_TABLET | Freq: Three times a day (TID) | ORAL | Status: DC | PRN

## 2012-09-19 MED ORDER — MEGESTROL ACETATE 40 MG PO TABS: 20.0000 mg | ORAL_TABLET | Freq: Every day | ORAL | Status: DC

## 2012-09-19 MED ORDER — ZOLPIDEM TARTRATE 5 MG PO TABS: 5.0000 mg | ORAL_TABLET | Freq: Every evening | ORAL | Status: DC | PRN

## 2012-09-19 NOTE — Telephone Encounter (Signed)
Pt called and she is feeling very anxious that Dr> Truddie Coco is not here.  States she left a message asking for medication refills.   (I do not see any documentation of this message or request).  I explained she can always call her pharmacy and ask for refills.    I offered a follow up for her and she agreed to come in and see Joylene John on Monday 09/24/12 at 3:45.   She needs Megace, Ambien, Meloxicam, Protonox, Gabepentin.    Scheduled lab for Friday in prep for her appt Monday.   SHe requested to see Dr. Jana Hakim so I noted that request and Lenna Sciara can seek Dr. Jana Hakim when Maudie Mercury is here. Maudie Mercury has my direct number should she have any more questions.

## 2012-09-21 ENCOUNTER — Other Ambulatory Visit (HOSPITAL_BASED_OUTPATIENT_CLINIC_OR_DEPARTMENT_OTHER): Payer: 59 | Admitting: Lab

## 2012-09-21 ENCOUNTER — Other Ambulatory Visit: Payer: Self-pay | Admitting: Oncology

## 2012-09-21 DIAGNOSIS — C50919 Malignant neoplasm of unspecified site of unspecified female breast: Secondary | ICD-10-CM

## 2012-09-21 LAB — COMPREHENSIVE METABOLIC PANEL (CC13)
ALT: 11 U/L (ref 0–55)
AST: 12 U/L (ref 5–34)
Alkaline Phosphatase: 85 U/L (ref 40–150)
Calcium: 9.6 mg/dL (ref 8.4–10.4)
Chloride: 107 mEq/L (ref 98–107)
Creatinine: 0.9 mg/dL (ref 0.6–1.1)
Total Bilirubin: 0.58 mg/dL (ref 0.20–1.20)

## 2012-09-21 LAB — CBC WITH DIFFERENTIAL/PLATELET
BASO%: 0.1 % (ref 0.0–2.0)
EOS%: 0 % (ref 0.0–7.0)
HCT: 39.4 % (ref 34.8–46.6)
MCH: 29.9 pg (ref 25.1–34.0)
MCHC: 34 g/dL (ref 31.5–36.0)
MCV: 88 fL (ref 79.5–101.0)
MONO%: 7.2 % (ref 0.0–14.0)
NEUT%: 82.2 % — ABNORMAL HIGH (ref 38.4–76.8)
lymph#: 1 10*3/uL (ref 0.9–3.3)

## 2012-09-21 NOTE — Telephone Encounter (Signed)
Called and cancelled with pharmacist at [facility], this was previously done by Marlon Pel, RN on 09/19/12.

## 2012-09-21 NOTE — Telephone Encounter (Signed)
Per MDoncall, next appt 09/24/12

## 2012-09-24 ENCOUNTER — Ambulatory Visit (HOSPITAL_BASED_OUTPATIENT_CLINIC_OR_DEPARTMENT_OTHER): Payer: 59 | Admitting: Gynecologic Oncology

## 2012-09-24 VITALS — BP 135/88 | HR 106 | Temp 98.5°F | Resp 20 | Ht 67.25 in | Wt 180.3 lb

## 2012-09-24 DIAGNOSIS — Z171 Estrogen receptor negative status [ER-]: Secondary | ICD-10-CM

## 2012-09-24 DIAGNOSIS — C50919 Malignant neoplasm of unspecified site of unspecified female breast: Secondary | ICD-10-CM

## 2012-09-24 DIAGNOSIS — Z853 Personal history of malignant neoplasm of breast: Secondary | ICD-10-CM

## 2012-09-24 MED ORDER — MELOXICAM 7.5 MG PO TABS: ORAL_TABLET | ORAL | Status: DC

## 2012-09-24 MED ORDER — MEGESTROL ACETATE 40 MG PO TABS: 20.0000 mg | ORAL_TABLET | Freq: Every day | ORAL | Status: DC

## 2012-09-24 MED ORDER — PANTOPRAZOLE SODIUM 40 MG PO TBEC: 40.0000 mg | DELAYED_RELEASE_TABLET | Freq: Every day | ORAL | Status: DC

## 2012-09-24 MED ORDER — GABAPENTIN 300 MG PO CAPS: 300.0000 mg | ORAL_CAPSULE | Freq: Two times a day (BID) | ORAL | Status: DC

## 2012-09-24 NOTE — Progress Notes (Signed)
ID: Gabrielle Hawkins   DOB: Jul 02, 1966  MR#: [id number]  IRW#:[id number]  PCP: Salena Saner., MD GYN: Dr. 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 Radiation was completed August of 2006.    INTERVAL HISTORY:  She reports today for medication refills.  She was last seen by Dr. Truddie Coco on 07/30/12 with plans for follow up in 6 months at that time.  She reports improvement in peripheral neuropathy with gabapentin use twice daily.  She reports her lymphedema of the right upper extremity as stable at this time.  She completed PT for lymphedema last year and she continues to wear her compression sleeve as needed.  She reports that she would like to continue Megace because she feels she "has set backs when weaning" with cold intolerance reported.  She continues to report insomnia along with continued generalized aches and pains.    REVIEW OF SYSTEMS: Constitutional: Feels well.  Cardiovascular: No chest pain, shortness of breath, or edema.  Pulmonary: No cough or wheeze.  Gastrointestinal: No nausea, vomiting, or diarrhea. No bright red blood per rectum or change in bowel movement.  Genitourinary: No frequency, urgency, or dysuria. No vaginal bleeding or discharge.  Musculoskeletal: No myalgia.  Intermittent back pain and generalized aches and pains. Neurologic: No weakness or change in gait.  Reporting improvement in peripheral neuropathy with gabapentin use with no difficulty picking items up, buttoning buttons, or flipping pages in a magazine.  Psychology: No depression or anxiety. Insomnia reported on a regular basis.  PAST MEDICAL HISTORY: Past Medical History  Diagnosis Date  . Neuropathy   . Breast cancer   . Fibroid   . Endometriosis     . Mucinous cystadenoma of ovary     left  . BRCA1 negative   . BRCA2 negative   . History of chemotherapy 01/2005  . Radiation 04/2005  . Hx of tamoxifen therapy   . Migraines   . Arthritis   . Fibromyalgia   . Vitamin D deficiency   . Lymphedema of arm     PAST SURGICAL HISTORY: Past Surgical History  Procedure Date  . Tonsillectomy   . Myomectomy   . Abdominal hysterectomy 2007    TAH/BSO  . Dilation and curettage of uterus   . Breast surgery 2006    LUMPECTOMY    FAMILY HISTORY Family History  Problem Relation Age of Onset  . Cancer Mother 80  . Heart failure Father   . Heart disease Father   . Cancer Maternal Grandmother     COLON  . Heart failure Paternal Grandmother   . 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  Followed by Dr. Hebert Soho.  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   HEALTH MAINTENANCE: History  Substance Use Topics  . Smoking status: Never Smoker   . Smokeless tobacco: Never Used  . Alcohol Use: No     Allergies  Allergen Reactions  . Ampicillin Hives and Itching  . Compazine (Prochlorperazine Maleate)     "Stroke-like symptoms"  . Imitrex (Sumatriptan Base) Hives and Nausea And Vomiting  . Metoclopramide Hcl Hives  . Effexor (Venlafaxine Hydrochloride) Hives and Palpitations    Current  Outpatient Prescriptions  Medication Sig Dispense Refill  . albuterol (PROVENTIL HFA;VENTOLIN HFA) 108 (90 BASE) MCG/ACT inhaler Inhale 2 puffs into the lungs every 6 (six) hours as needed. For shortness of breath.      . calcium carbonate (OS-CAL) 1250 MG chewable tablet Chew 1 tablet by mouth daily.      . cefdinir (OMNICEF) 300 MG capsule Take 1 capsule (300 mg total) by mouth 2 (two) times daily.  20 capsule  0  . chlorpheniramine-HYDROcodone (TUSSIONEX) 10-8 MG/5ML LQCR Take 5 mLs by mouth every 12 (twelve) hours as needed.  140  mL  0  . cholecalciferol (VITAMIN D) 1000 UNITS tablet Take 1,000 Units by mouth daily.      . Cyanocobalamin (VITAMIN B-12 IJ) Inject 1,000 mcg as directed every 30 (thirty) days.      . cyclobenzaprine (FLEXERIL) 5 MG tablet Take 1 tablet (5 mg total) by mouth 3 (three) times daily as needed. For joint pain.  90 tablet  0  . fluticasone (FLONASE) 50 MCG/ACT nasal spray Place 2 sprays into the nose daily.  16 g  0  . gabapentin (NEURONTIN) 300 MG capsule Take 1 capsule (300 mg total) by mouth 2 (two) times daily.  180 capsule  0  . ibuprofen (ADVIL,MOTRIN) 200 MG tablet Take 400 mg by mouth every 8 (eight) hours as needed. For pain.      . megestrol (MEGACE) 40 MG tablet Take 0.5 tablets (20 mg total) by mouth at bedtime.  45 tablet  0  . meloxicam (MOBIC) 7.5 MG tablet 1 tab daily  90 tablet  0  . pantoprazole (PROTONIX) 40 MG tablet Take 1 tablet (40 mg total) by mouth daily.  90 tablet  0  . zolpidem (AMBIEN) 5 MG tablet TAKE 1 TABLET BY MOUTH EVERY NIGHT AT BEDTIME AS NEEDED FOR SLEEP  30 tablet  0    OBJECTIVE: Filed Vitals:   09/24/12 1546  BP: 135/88  Pulse: 106  Temp: 98.5 F (36.9 C)  Resp: 20     Body mass index is 28.03 kg/(m^2).    ECOG FS:  General: Well developed, well nourished female in no acute distress. Alert and oriented x 3.  Neck: Supple without any enlargements.  Lymph node survey: No cervical, supraclavicular, or inguinal adenopathy  Cardiovascular: Regular rate and rhythm. S1 and S2 normal.  Lungs: Clear to auscultation bilaterally. No wheezes/crackles/rhonchi noted.  Skin: No rashes or lesions present. Back: No CVA tenderness  Abdomen: Abdomen soft, non-tender and obese. Active bowel sounds in all quadrants. No evidence of a fluid wave or abdominal masses.  Breast: Deferred per patient since last examination in November of 2013. Extremities: No bilateral cyanosis, edema, or clubbing.    LAB RESULTS: Lab Results  Component Value Date   WBC 9.7 09/21/2012     NEUTROABS 8.0* 09/21/2012   HGB 13.4 09/21/2012   HCT 39.4 09/21/2012   MCV 88.0 09/21/2012   PLT 189 09/21/2012      Chemistry      Component Value Date/Time   NA 139 09/21/2012 1140   NA 140 02/22/2012 1332   K 3.5 09/21/2012 1140   K 3.8 02/22/2012 1332   CL 107 09/21/2012 1140   CL 106 02/22/2012 1332   CO2 22 09/21/2012 1140   CO2 26 02/22/2012 1332   BUN 17.0 09/21/2012 1140   BUN 12 02/22/2012 1332   CREATININE 0.9 09/21/2012 1140   CREATININE 0.90 02/22/2012 1332      Component  Value Date/Time   CALCIUM 9.6 09/21/2012 1140   CALCIUM 9.1 02/22/2012 1332   ALKPHOS 85 09/21/2012 1140   ALKPHOS 76 02/22/2012 1332   AST 12 09/21/2012 1140   AST 13 02/22/2012 1332   ALT 11 09/21/2012 1140   ALT 10 02/22/2012 1332   BILITOT 0.58 09/21/2012 1140   BILITOT 0.4 02/22/2012 1332       Lab Results  Component Value Date   LABCA2 22 07/30/2012    No components found with this basename: [id number]    No results found for this basename: INR:1;PROTIME:1 in the last 168 hours  Urinalysis    Component Value Date/Time   COLORURINE YELLOW 03/30/2010 1055   APPEARANCEUR CLEAR 03/30/2010 1055   LABSPEC 1.033* 03/30/2010 1055   PHURINE 5.5 03/30/2010 1055   GLUCOSEU NEGATIVE 03/30/2010 1055   HGBUR NEGATIVE 03/30/2010 1055   BILIRUBINUR SMALL* 03/30/2010 1055   KETONESUR NEGATIVE 03/30/2010 1055   PROTEINUR NEGATIVE 03/30/2010 1055   UROBILINOGEN 0.2 03/30/2010 1055   NITRITE NEGATIVE 03/30/2010 1055   LEUKOCYTESUR NEGATIVE MICROSCOPIC NOT DONE ON URINES WITH NEGATIVE PROTEIN, BLOOD, LEUKOCYTES, NITRITE, OR GLUCOSE <1000 mg/dL. 03/30/2010 1055    STUDIES: No results found.  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  Radiation was completed August of 2006.     PLAN: She is to follow up with Dr. Jana Hakim in 3 months for continued follow up and to discuss current medications.  She has been given 3 months of refills per pt request for gabapentin, megace, mobic, protonix, and ambien (5 mg tablet one at bedtime as needed for insomnia, #30, 2 refills) with the understanding that at her next visit, some medications may be discontinued or referred to her primary care physican to continue.  She is instructed to call the office for any questions or concerns.    The medication refill situation was discussed with Lewayne Bunting, Gabrielle Hawkins.   Hawkins, Gabrielle DEAL    09/24/2012

## 2012-09-25 ENCOUNTER — Telehealth: Payer: Self-pay | Admitting: Oncology

## 2012-09-25 ENCOUNTER — Encounter: Payer: Self-pay | Admitting: Gynecologic Oncology

## 2012-09-25 NOTE — Telephone Encounter (Signed)
lvm regarding to may appt...Marland KitchenMarland KitchenDone

## 2012-09-25 NOTE — Patient Instructions (Signed)
Follow up with Dr. Jana Hakim in 3 months for continued follow up and to discuss current medications.  You have been given 3 months of refills per your request for gabapentin, megace, mobic, protonix, and ambien with the understanding that at your next visit, some medications may be discontinued or referred to your primary care physican to continue.  Please call the office for any questions or concerns.    Thank you for coming to see me today.  I appreciate your confidence in choosing [facility] for your medical care.  If you have any questions about your visit today, please call our office and we will get back to you as soon as possible.  Gabrielle John, NP Medical Oncology

## 2012-10-25 ENCOUNTER — Telehealth: Payer: Self-pay | Admitting: Oncology

## 2012-10-25 ENCOUNTER — Other Ambulatory Visit: Payer: Self-pay | Admitting: *Deleted

## 2012-10-25 MED ORDER — TRAMADOL HCL 50 MG PO TABS: 50.0000 mg | ORAL_TABLET | Freq: Four times a day (QID) | ORAL | Status: DC | PRN

## 2012-10-25 NOTE — Telephone Encounter (Signed)
Pt called and discussed issues relating to arm on surgical side with discomfort. Per that discussion pt has been referred back to the lymphedema clinic. Appointment cannot be accomadated until late next week. Per above MD recommended use of tylenol or aleve.  If further medication indication recommended increasing neurontin or use of tramadol.  Above discussed with pt. Per conversations she states she has attempted to increase the gabapentin previously with side effects of increased dizziness. Plan per call is pt will utilize tyenol or aleve during the day and use tramadol at night if needed.

## 2012-10-25 NOTE — Telephone Encounter (Signed)
The patient called requesting pain meds for her lymph edema.  I asked if she has she our lymph edema specialists in PT lately and she said it has been a long time.   She states that her arm gets tight by the end of the day and it really hurts her.   I explained she really needs to be assessed because PT can help diagnose and treat lymph edema and help it improve.   I will contact PT and inquire about her last visits.   The Pt stated she cannot afford treatment- so I will ask PT if they can look into her copay.   I will relay this to Dr. Jana Hakim.

## 2013-01-14 ENCOUNTER — Ambulatory Visit (HOSPITAL_BASED_OUTPATIENT_CLINIC_OR_DEPARTMENT_OTHER): Payer: 59 | Admitting: Oncology

## 2013-01-14 VITALS — BP 129/91 | HR 91 | Temp 98.6°F | Resp 20 | Ht 67.0 in | Wt 178.3 lb

## 2013-01-14 DIAGNOSIS — Z853 Personal history of malignant neoplasm of breast: Secondary | ICD-10-CM

## 2013-01-14 DIAGNOSIS — G589 Mononeuropathy, unspecified: Secondary | ICD-10-CM

## 2013-01-14 DIAGNOSIS — C50911 Malignant neoplasm of unspecified site of right female breast: Secondary | ICD-10-CM

## 2013-01-14 MED ORDER — MEGESTROL ACETATE 40 MG PO TABS: 20.0000 mg | ORAL_TABLET | Freq: Every day | ORAL | Status: DC

## 2013-01-14 MED ORDER — GABAPENTIN 300 MG PO CAPS: 300.0000 mg | ORAL_CAPSULE | Freq: Two times a day (BID) | ORAL | Status: DC | PRN

## 2013-01-14 MED ORDER — CYCLOBENZAPRINE HCL 5 MG PO TABS: 5.0000 mg | ORAL_TABLET | Freq: Three times a day (TID) | ORAL | Status: DC | PRN

## 2013-01-14 MED ORDER — TRAMADOL HCL 50 MG PO TABS: 50.0000 mg | ORAL_TABLET | Freq: Four times a day (QID) | ORAL | Status: DC | PRN

## 2013-01-14 NOTE — Progress Notes (Signed)
ID: Gabrielle Hawkins   DOB: 26-Jun-1966  MR#: [id number]  WLN#:[id number]  PCP: Salena Saner., MD GYN: Dr. 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 Radiation was completed August of 2006. Her treatment history is detailed below.   INTERVAL HISTORY:  Gabrielle Hawkins returns today for followup of her breast cancer. Interval history is unremarkable.  REVIEW OF SYSTEMS: She tends to feel "cold", and findings that the Megace is helpful in that regard. When she feels cold, she has aches in both hands and sometimes a whole right arm. She attributes that to neuropathy from her chemotherapy. She has very little to no lymphedema of the right arm, when it occurs it's temporary, and she does have a sleeve and knows how to do massage. She is back and joint pain, and is seeing Dr. Patrice Paradise for that. Otherwise a detailed review of systems today was noncontributory  PAST MEDICAL HISTORY: Past Medical History  Diagnosis Date  . Neuropathy   . Breast cancer   . Fibroid   . Endometriosis   . Mucinous cystadenoma of ovary     left  . BRCA1 negative   . BRCA2 negative   . History of chemotherapy 01/2005  . Radiation 04/2005  . Hx of tamoxifen therapy   . Migraines   . Arthritis   . Fibromyalgia   . Vitamin D deficiency   . Lymphedema of arm     PAST SURGICAL HISTORY: Past Surgical History  Procedure Laterality Date  . Tonsillectomy    . Myomectomy    . Abdominal hysterectomy  2007    TAH/BSO  . Dilation and curettage of uterus    . Breast surgery  2006    LUMPECTOMY    FAMILY HISTORY Family History  Problem Relation Age of Onset  . Cancer Mother 10  . Heart failure Father   . Heart disease Father   . Cancer Maternal Grandmother     COLON  . Heart  failure Paternal Grandmother   . Heart disease Paternal Grandmother    GYNECOLOGIC HISTORY:  Hysterectomy and BSO in 2007.  Followed by Dr. Toney Rakes.  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   HEALTH MAINTENANCE: History  Substance Use Topics  . Smoking status: Never Smoker   . Smokeless tobacco: Never Used  . Alcohol Use: No     Allergies  Allergen Reactions  . Ampicillin Hives and Itching  . Compazine (Prochlorperazine Maleate)     "Stroke-like symptoms"  . Imitrex (Sumatriptan Base) Hives and Nausea And Vomiting  . Metoclopramide Hcl Hives  . Effexor (Venlafaxine Hydrochloride) Hives and Palpitations    Current Outpatient Prescriptions  Medication Sig Dispense Refill  . albuterol (PROVENTIL HFA;VENTOLIN HFA) 108 (90 BASE) MCG/ACT inhaler Inhale 2 puffs into the lungs every 6 (six) hours as needed. For shortness of breath.      . calcium carbonate (OS-CAL) 1250 MG chewable tablet Chew 1 tablet by mouth daily.      . cholecalciferol (VITAMIN D) 1000 UNITS tablet Take 1,000 Units by mouth daily.      . Cyanocobalamin (VITAMIN B-12 IJ) Inject 1,000 mcg as directed every 30 (thirty) days.      . cyclobenzaprine (FLEXERIL) 5 MG tablet Take 1 tablet (5 mg total) by mouth 3 (three)  times daily as needed. For joint pain.  90 tablet  0  . fluticasone (FLONASE) 50 MCG/ACT nasal spray Place 2 sprays into the nose daily.  16 g  0  . gabapentin (NEURONTIN) 300 MG capsule Take 1 capsule (300 mg total) by mouth 2 (two) times daily.  180 capsule  0  . ibuprofen (ADVIL,MOTRIN) 200 MG tablet Take 400 mg by mouth every 8 (eight) hours as needed. For pain.      . megestrol (MEGACE) 40 MG tablet Take 0.5 tablets (20 mg total) by mouth at bedtime.  45 tablet  0  . meloxicam (MOBIC) 7.5 MG tablet 1 tab daily  90 tablet  0  . pantoprazole (PROTONIX) 40 MG tablet Take 1 tablet (40 mg total) by mouth daily.  90 tablet  0  . traMADol (ULTRAM) 50 MG tablet Take 1 tablet (50 mg total) by  mouth every 6 (six) hours as needed for pain.  30 tablet  0  . zolpidem (AMBIEN) 5 MG tablet TAKE 1 TABLET BY MOUTH EVERY NIGHT AT BEDTIME AS NEEDED FOR SLEEP  30 tablet  0   No current facility-administered medications for this visit.    OBJECTIVE: Young African American woman who appears well Filed Vitals:   01/14/13 1609  BP: 129/91  Pulse: 91  Temp: 98.6 F (37 C)  Resp: 20     Body mass index is 27.92 kg/(m^2).    ECOG FS:  Sclerae unicteric Oropharynx clear No cervical or supraclavicular adenopathy Lungs no rales or rhonchi Heart regular rate and rhythm Abd benign MSK no focal spinal tenderness, no peripheral edema Neuro: nonfocal, well oriented, pleasant affect Breasts: The right breast is status post lumpectomy and radiation. There is no evidence of local recurrence. The right axilla is benign. The left breast is unremarkable.   LAB RESULTS: Lab Results  Component Value Date   WBC 9.7 09/21/2012   NEUTROABS 8.0* 09/21/2012   HGB 13.4 09/21/2012   HCT 39.4 09/21/2012   MCV 88.0 09/21/2012   PLT 189 09/21/2012      Chemistry      Component Value Date/Time   NA 139 09/21/2012 1140   NA 140 02/22/2012 1332   K 3.5 09/21/2012 1140   K 3.8 02/22/2012 1332   CL 107 09/21/2012 1140   CL 106 02/22/2012 1332   CO2 22 09/21/2012 1140   CO2 26 02/22/2012 1332   BUN 17.0 09/21/2012 1140   BUN 12 02/22/2012 1332   CREATININE 0.9 09/21/2012 1140   CREATININE 0.90 02/22/2012 1332      Component Value Date/Time   CALCIUM 9.6 09/21/2012 1140   CALCIUM 9.1 02/22/2012 1332   ALKPHOS 85 09/21/2012 1140   ALKPHOS 76 02/22/2012 1332   AST 12 09/21/2012 1140   AST 13 02/22/2012 1332   ALT 11 09/21/2012 1140   ALT 10 02/22/2012 1332   BILITOT 0.58 09/21/2012 1140   BILITOT 0.4 02/22/2012 1332       Lab Results  Component Value Date   LABCA2 22 07/30/2012    No components found with this basename: [id number]    No results found for this basename: INR,  in the last 168  hours  Urinalysis    Component Value Date/Time   COLORURINE YELLOW 03/30/2010 [place] 03/30/2010 1055   LABSPEC 1.033* 03/30/2010 1055   PHURINE 5.5 03/30/2010 [place] 03/30/2010 1055   [place] 03/30/2010 1055   BILIRUBINUR SMALL* 03/30/2010 1055  KETONESUR NEGATIVE 03/30/2010 1055   PROTEINUR NEGATIVE 03/30/2010 1055   UROBILINOGEN 0.2 03/30/2010 1055   NITRITE NEGATIVE 03/30/2010 1055   LEUKOCYTESUR NEGATIVE MICROSCOPIC NOT DONE ON URINES WITH NEGATIVE PROTEIN, BLOOD, LEUKOCYTES, NITRITE, OR GLUCOSE <1000 mg/dL. 03/30/2010 1055    STUDIES: No results found.  ASSESSMENT:  47 y.o. [place]   (1) status post right lumpectomy and sentinel lymph node sampling 10/04/2004 for a pT1b pN0, stage IA invasive ductal carcinoma, grade 3, triple negative,   (2) treated adjuvantly with fluoroscopy uracil, epirubicin and cyclophosphamide x6 completed Jan 17 2005  (3) status post adjuvant radiation completed 04/14/2005  (4) status post TAH-BSO 10/26/2005  PLAN: Gabrielle Hawkins is now 8 years out from her original surgery. As we discussed today, estrogen receptor negative breast cancers, if there are going to recur, tend to recur late. She has an excellent prognosis overall, and I am comfortable releasing her to her primary physician's care at this point.  Accordingly she "graduate" from cancer followup today. I did refill the medications we have been riding for her, namely gabapentin, megestrol, tramadol, and Robaxin. She understands future refills would have to come through her primary care physician at that physician's discretion. We will be glad to see him at any point in the future if the need arises, but as of now no further appointments are being made for her here.  Gabrielle Hawkins C    01/14/2013

## 2013-01-15 ENCOUNTER — Telehealth: Payer: Self-pay | Admitting: Oncology

## 2013-01-15 NOTE — Telephone Encounter (Signed)
Letter sent to Dr. Karlton Lemon from Dr. Jana Hakim

## 2013-01-21 ENCOUNTER — Encounter: Payer: Self-pay | Admitting: Internal Medicine

## 2013-01-29 ENCOUNTER — Telehealth: Payer: Self-pay | Admitting: *Deleted

## 2013-01-29 ENCOUNTER — Other Ambulatory Visit: Payer: Self-pay | Admitting: Oncology

## 2013-01-29 NOTE — Telephone Encounter (Signed)
Lm informing the pt that [facility] referred her to Dr. Wells Guiles Tat, and that i sw Sherri at there office. Sherrie informed me that the dr like to review the notes first and then they will call the pt directly w/ an appt. Pt is aware of all this info...td

## 2013-01-31 ENCOUNTER — Telehealth: Payer: Self-pay | Admitting: Oncology

## 2013-01-31 NOTE — Telephone Encounter (Signed)
Former PR pt. pof sent 5/27 by Gm to schedule appt w/Dr. Carles Collet [facility]). Sherry from [facility] wanting to know if pt's neuropathy is chemo induced. Message was forwarded to desk nurse to answer this question.

## 2013-02-04 ENCOUNTER — Telehealth: Payer: Self-pay | Admitting: *Deleted

## 2013-02-04 NOTE — Telephone Encounter (Signed)
Pt called to ask if she still needed to come for her yearly exams since she has had a Hyst BSO. I advised yes yearly exams for a breast exam and pelvic exam. Left this on her VM ok per pt. KW

## 2013-02-05 ENCOUNTER — Telehealth: Payer: Self-pay | Admitting: Oncology

## 2013-02-05 NOTE — Telephone Encounter (Signed)
Received call from [facility] yesterday and per Judeen Hammans pt scheduled to see Dr. Carles Collet 6/12 @ 2pm and pt is aware.

## 2013-02-14 ENCOUNTER — Encounter: Payer: Self-pay | Admitting: Neurology

## 2013-02-14 ENCOUNTER — Ambulatory Visit (INDEPENDENT_AMBULATORY_CARE_PROVIDER_SITE_OTHER): Payer: 59 | Admitting: Neurology

## 2013-02-14 ENCOUNTER — Ambulatory Visit (INDEPENDENT_AMBULATORY_CARE_PROVIDER_SITE_OTHER): Payer: 59 | Admitting: Women's Health

## 2013-02-14 ENCOUNTER — Encounter: Payer: Self-pay | Admitting: Women's Health

## 2013-02-14 VITALS — BP 108/70 | HR 80 | Temp 98.1°F | Resp 18 | Wt 179.0 lb

## 2013-02-14 VITALS — BP 120/76 | Ht 68.0 in | Wt 174.0 lb

## 2013-02-14 DIAGNOSIS — R202 Paresthesia of skin: Secondary | ICD-10-CM

## 2013-02-14 DIAGNOSIS — Z01419 Encounter for gynecological examination (general) (routine) without abnormal findings: Secondary | ICD-10-CM

## 2013-02-14 DIAGNOSIS — G609 Hereditary and idiopathic neuropathy, unspecified: Secondary | ICD-10-CM

## 2013-02-14 DIAGNOSIS — R209 Unspecified disturbances of skin sensation: Secondary | ICD-10-CM

## 2013-02-14 DIAGNOSIS — G4700 Insomnia, unspecified: Secondary | ICD-10-CM

## 2013-02-14 LAB — BASIC METABOLIC PANEL
BUN: 9 mg/dL (ref 6–23)
Chloride: 107 mEq/L (ref 96–112)
Creatinine, Ser: 0.8 mg/dL (ref 0.4–1.2)
GFR: 102.57 mL/min (ref >60.00)
Glucose, Bld: 81 mg/dL (ref 70–99)
Potassium: 3.9 mEq/L (ref 3.5–5.1)

## 2013-02-14 LAB — VITAMIN B12: Vitamin B-12: 150 pg/mL — ABNORMAL LOW (ref 211–911)

## 2013-02-14 MED ORDER — ALPRAZOLAM 0.25 MG PO TABS: 0.2500 mg | ORAL_TABLET | Freq: Every evening | ORAL | Status: DC | PRN

## 2013-02-14 NOTE — Progress Notes (Signed)
Gabrielle Hawkins was seen today in neurologic consultation at the request of Salena Saner., MD.  The consultation is for the evaluation of peripheral neuropathy.  The patient is a 47 y.o. year old female with a history of breast CA.  She has burning, tingling paresthesias and has had this in the legs since tx for breast CA in 2006.  In 2006, she was placed on the neurontin along with pain meds, and it seemed to help.  She takes 341m, 2 po q hs of the neurontin.  She awakens at night with the pain and then has trouble sleeping.  She is unable to tolerate higher dosages of the neurontin.  In 2011, she was dx with fibromyalgia and they tried lyrica.  Lyrica caused her to pass out as did Cymbalta.  She began to have paresthesias in the entire hand a few years ago.  She states that the entire hand is involved.  She has never had EMG testing.   She has tried tramadol but she has cognitive dulling with the medication.  She states that flexeril makes her constipated but she has tried that.  She has tried mobic which has not helped.   She is having trouble with fine motor coordination of the hand, including buttoning clothing and typing.  She has not had any falls.  Bladder and bowel are under good control.   PREVIOUS MEDICATIONS: Cymbalta (syncope), Lyrica (syncope), Neurontin (cognitive going with daytime dosages), clonazepam (groggy), tramadol (cognitive dulling), Mobic (no help), Flexeril (constipation)  ALLERGIES:   Allergies  Allergen Reactions  . Ampicillin Hives and Itching  . Compazine (Prochlorperazine Maleate)     "Stroke-like symptoms"  . Imitrex (Sumatriptan Base) Hives and Nausea And Vomiting  . Metoclopramide Hcl Hives  . Effexor (Venlafaxine Hydrochloride) Hives and Palpitations    CURRENT MEDICATIONS:  Current Outpatient Prescriptions on File Prior to Visit  Medication Sig Dispense Refill  . albuterol (PROVENTIL HFA;VENTOLIN HFA) 108 (90 BASE) MCG/ACT inhaler Inhale 2 puffs  into the lungs every 6 (six) hours as needed. For shortness of breath.      . calcium carbonate (OS-CAL) 1250 MG chewable tablet Chew 1 tablet by mouth daily.      . cholecalciferol (VITAMIN D) 1000 UNITS tablet Take 1,000 Units by mouth daily.      . Cyanocobalamin (VITAMIN B-12 IJ) Inject 1,000 mcg as directed every 30 (thirty) days.      . cyclobenzaprine (FLEXERIL) 5 MG tablet Take 1 tablet (5 mg total) by mouth 3 (three) times daily as needed. For joint pain.  90 tablet  3  . fluticasone (FLONASE) 50 MCG/ACT nasal spray Place 2 sprays into the nose daily.  16 g  0  . gabapentin (NEURONTIN) 300 MG capsule Take 1 capsule (300 mg total) by mouth 2 (two) times daily as needed.  180 capsule  3  . megestrol (MEGACE) 40 MG tablet Take 0.5 tablets (20 mg total) by mouth at bedtime.  45 tablet  0  . meloxicam (MOBIC) 7.5 MG tablet 1 tab daily  90 tablet  0  . pantoprazole (PROTONIX) 40 MG tablet Take 1 tablet (40 mg total) by mouth daily.  90 tablet  0  . traMADol (ULTRAM) 50 MG tablet Take 1 tablet (50 mg total) by mouth every 6 (six) hours as needed for pain.  30 tablet  3  . zolpidem (AMBIEN) 5 MG tablet TAKE 1 TABLET BY MOUTH EVERY NIGHT AT BEDTIME AS NEEDED FOR SLEEP  30 tablet  0   No current facility-administered medications on file prior to visit.    PAST MEDICAL HISTORY:   Past Medical History  Diagnosis Date  . Neuropathy   . Breast cancer   . Fibroid   . Endometriosis   . Mucinous cystadenoma of ovary     left  . BRCA1 negative   . BRCA2 negative   . History of chemotherapy 01/2005  . Radiation 04/2005  . Hx of tamoxifen therapy   . Migraines   . Arthritis   . Fibromyalgia   . Vitamin D deficiency   . Lymphedema of arm     PAST SURGICAL HISTORY:   Past Surgical History  Procedure Laterality Date  . Tonsillectomy    . Myomectomy    . Abdominal hysterectomy  2007    TAH/BSO  . Dilation and curettage of uterus    . Breast surgery  2006    LUMPECTOMY    SOCIAL  HISTORY:   History   Social History  . Marital Status: Divorced    Spouse Name: N/A    Number of Children: N/A  . Years of Education: N/A   Occupational History  . Not on file.   Social History Main Topics  . Smoking status: Never Smoker   . Smokeless tobacco: Never Used  . Alcohol Use: No  . Drug Use: No  . Sexually Active: No   Other Topics Concern  . Not on file   Social History Narrative  . No narrative on file    FAMILY HISTORY:   Family Status  Relation Status Death Age  . Mother Alive   . Father Alive     ROS:  A complete 10 system review of systems was obtained and was unremarkable apart from what is mentioned above.  PHYSICAL EXAMINATION:    VITALS:   Filed Vitals:   02/14/13 1353  BP: 108/70  Pulse: 80  Temp: 98.1 F (36.7 C)  Resp: 18  Weight: 179 lb (81.194 kg)    GEN:  Normal appears female in no acute distress.  Appears stated age. HEENT:  Normocephalic, atraumatic. The mucous membranes are moist. The superficial temporal arteries are without ropiness or tenderness. Cardiovascular: Regular rate and rhythm. Lungs: Clear to auscultation bilaterally. Neck/Heme: There are no carotid bruits noted bilaterally.  NEUROLOGICAL: Orientation:  The patient is alert and oriented x 3.  Fund of knowledge is appropriate.  Recent and remote memory intact.  Attention span and concentration normal.  Repeats and names without difficulty. Cranial nerves: There is good facial symmetry. The pupils are equal round and reactive to light bilaterally. Fundoscopic exam reveals clear disc margins bilaterally. Extraocular muscles are intact and visual fields are full to confrontational testing. Speech is fluent and clear. Soft palate rises symmetrically and there is no tongue deviation. Hearing is intact to conversational tone. Tone: Tone is good throughout. Sensation: Sensation is intact to light touch and pinprick throughout (facial, trunk, extremities).  Pinprick is  decreased in a stocking and glove distribution.  Vibration is markedly decreased in a distal fashion.  There is no extinction with double simultaneous stimulation.  There is no sensory dermatomal level identified.   Coordination:  The patient has no difficulty with RAM's or FNF bilaterally. Motor: Strength is 5/5 in the bilateral upper and lower extremities.  Shoulder shrug is equal and symmetric. There is no pronator drift.  There are no fasciculations noted. DTR's: Deep tendon reflexes are 1/4 at the bilateral biceps, triceps, brachioradialis, patella and  absent at the bilateral achilles.  Plantar responses are downgoing bilaterally. Gait and Station: The patient is able to ambulate without difficulty. The patient is able to heel toe walk without any difficulty. The patient is able to ambulate in a tandem fashion. The patient is able to stand in the Romberg position.   IMPRESSION/PLAN  1. peripheral neuropathy.  -I. am sure that this is from chemotherapy, but she reports some progression of her symptoms despite the fact that chemotherapy has been completed for years.  I think we should recheck some blood work including B12 (apparently a history of deficiency, but she stopped the injections one year ago), folate, RPR, glucose, SPEP and UPEP with immunofixation.  -I. would like to do an EMG of the upper extremities to make sure that she does not have a coexisting median neuropathy.  She would like to have this done at [facility], where she works and we will get this scheduled.  -There are several neuropathic medications that she has not tried, including Topamax, Pamelor and Elavil, but we decided to hold on changing anything until we get the results of her EMG back.  -I. will see her back in 4 weeks.

## 2013-02-14 NOTE — Patient Instructions (Addendum)

## 2013-02-14 NOTE — Patient Instructions (Signed)
We will see you back in four weeks.

## 2013-02-15 ENCOUNTER — Encounter: Payer: Self-pay | Admitting: Women's Health

## 2013-02-15 LAB — URINALYSIS W MICROSCOPIC + REFLEX CULTURE
Casts: NONE SEEN
Glucose, UA: NEGATIVE mg/dL
Hgb urine dipstick: NEGATIVE
Ketones, ur: NEGATIVE mg/dL
Leukocytes, UA: NEGATIVE
Protein, ur: NEGATIVE mg/dL
pH: 6 (ref 5.0–8.0)

## 2013-02-15 NOTE — Progress Notes (Signed)
Gabrielle Hawkins 30-Oct-1965 [id number]    History:    The patient presents for annual exam.  TAH with BSO in 2007 for fibroids, benign cystadenoma and endometriosis. Right breast cancer 2006 with lumpectomy/radiation/chemotherapy. Dr. 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 BRCA negative.tamoxifen for one year.  Negative mammogram 06/2012. Negative PET scan 10/2011. Negative colonoscopy in 2013. Currently having problems with right arm lymphedema and neuropathy in both hands causing poor sleep due to pain. Mother with history of breast cancer with mastectomy at age 55 living.    Past medical history, past surgical history, family history and social history were all reviewed and documented in the EPIC chart. 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 Father hypertension. Lives with aunt.   ROS:  A  ROS was performed and pertinent positives and negatives are included in the history.  Exam:  Filed Vitals:   02/14/13 1520  BP: 120/76    General appearance:  Normal Head/Neck:  Normal, without cervical or supraclavicular adenopathy. Thyroid:  Symmetrical, normal in size, without palpable masses or nodularity. Respiratory  Effort:  Normal  Auscultation:  Clear without wheezing or rhonchi Cardiovascular  Auscultation:  Regular rate, without rubs, murmurs or gallops  Edema/varicosities:  Not grossly evident Abdominal  Soft,nontender, without masses, guarding or rebound.  Liver/spleen:  No organomegaly noted  Hernia:  None appreciated  Skin  Inspection:  Grossly normal  Palpation:  Grossly normal Neurologic/psychiatric  Orientation:  Normal with appropriate conversation.  Mood/affect:  Normal  Genitourinary    Breasts: Examined lying and sitting.     Right: Without masses, retractions, discharge or axillary adenopathy.     Left: Without masses, retractions, discharge or axillary adenopathy.   Inguinal/mons:  Normal without  inguinal adenopathy  External genitalia:  Normal  BUS/Urethra/Skene's glands:  Normal  Bladder:  Normal  Vagina:  Normal  Cervix:  absent Uterus:  absent  Adnexa/parametria:     Rt: Without masses or tenderness.   Lt: Without masses or tenderness.  Anus and perineum: Normal  Digital rectal exam: Normal sphincter tone without palpated masses or tenderness  Assessment/Plan:  47 y.o. SBF G1 P0 for annual exam with complaint of hand and arm pain causing insomnia and decrease in quality of life.     TAH/BSO 2007 Right breast cancer with lumpectomy/radiation/chemotherapy with right arm lymphedema and bilateral hand neuropathy Fatigue/insomnia  Plan: Continue followup with neurologist to evaluate neuropathy. Reports sleeve helping with lymphedema will continue. SBE's, continue annual mammogram 3 D tomography reviewed and encouraged. Calcium rich diet, vitamin D 2000 daily encouraged. Increase exercise as able. Condoms encouraged if becomes sexually active. Insomnia discuss, reviewed sleep hygiene, has used Ambien in the past but had problems with feeling groggy in the a.m. Will try Xanax 0.25 at at bedtime, aware of addictive properties and will use sparingly.     Levy, 6:56 AM 02/15/2013

## 2013-02-16 LAB — URINE CULTURE: Colony Count: >=100000

## 2013-02-18 ENCOUNTER — Other Ambulatory Visit: Payer: Self-pay | Admitting: Women's Health

## 2013-02-18 LAB — IMMUNOFIXATION ELECTROPHORESIS
IgA: 147 mg/dL (ref 69–380)
IgG (Immunoglobin G), Serum: 1320 mg/dL (ref 690–1700)
IgM, Serum: 164 mg/dL (ref 52–322)
Total Protein, Serum Electrophoresis: 7.4 g/dL (ref 6.0–8.3)

## 2013-02-18 LAB — UIFE/LIGHT CHAINS/TP QN, 24-HR UR
Free Kappa Lt Chains,Ur: 0.29 mg/dL (ref 0.14–2.42)
Free Kappa/Lambda Ratio: 9.67 ratio (ref 2.04–10.37)
Gamma Globulin, Urine: DETECTED — AB
Total Protein, Urine: 2 mg/dL

## 2013-02-18 LAB — PROTEIN ELECTROPHORESIS, SERUM
Albumin ELP: 53.7 % — ABNORMAL LOW (ref 55.8–66.1)
Alpha-1-Globulin: 4.2 % (ref 2.9–4.9)
Alpha-2-Globulin: 12.5 % — ABNORMAL HIGH (ref 7.1–11.8)
Beta Globulin: 6.3 % (ref 4.7–7.2)
Total Protein, Serum Electrophoresis: 7.4 g/dL (ref 6.0–8.3)

## 2013-02-18 MED ORDER — SULFAMETHOXAZOLE-TRIMETHOPRIM 800-160 MG PO TABS: 1.0000 | ORAL_TABLET | Freq: Two times a day (BID) | ORAL | Status: AC

## 2013-02-19 ENCOUNTER — Other Ambulatory Visit: Payer: Self-pay | Admitting: Women's Health

## 2013-02-19 DIAGNOSIS — N390 Urinary tract infection, site not specified: Secondary | ICD-10-CM

## 2013-02-22 ENCOUNTER — Ambulatory Visit: Payer: 59 | Admitting: Oncology

## 2013-02-22 ENCOUNTER — Telehealth: Payer: Self-pay

## 2013-02-22 ENCOUNTER — Other Ambulatory Visit: Payer: 59 | Admitting: Lab

## 2013-02-22 DIAGNOSIS — R202 Paresthesia of skin: Secondary | ICD-10-CM

## 2013-02-22 NOTE — Telephone Encounter (Signed)
MRI scheduled for Friday, June 27th at 6:45pm.  Pt aware.

## 2013-02-25 ENCOUNTER — Telehealth: Payer: Self-pay

## 2013-02-25 NOTE — Telephone Encounter (Signed)
Pt's MRI was scheduled for Friday at [facility] (due to being able to have it done after work), but she was going to reschedule and they told her she would have $1000 deductible to pay and that she might want to get it done at [facility] or [facility].  She is fine with that, but would like to wait to reschedule until she has her EMG done next Monday, the 30th.

## 2013-02-26 ENCOUNTER — Other Ambulatory Visit: Payer: Self-pay | Admitting: Lab

## 2013-02-26 NOTE — Telephone Encounter (Signed)
That is fine with me, so long as she knows that these 2 tests will give [place] completely different, unrelated information.  BTW...has she started B12 yet?

## 2013-02-26 NOTE — Telephone Encounter (Signed)
Pt will be doing her own b12 injections she still has a few from previously that are not expired.

## 2013-02-27 ENCOUNTER — Encounter: Payer: Self-pay | Admitting: Gynecology

## 2013-02-28 ENCOUNTER — Telehealth: Payer: Self-pay | Admitting: Neurology

## 2013-02-28 ENCOUNTER — Other Ambulatory Visit: Payer: 59

## 2013-02-28 NOTE — Telephone Encounter (Signed)
Notified pt to use either MyChart or call, not [facility] Email.  Also notified of Dr. Doristine Devoid recommendations.  She will go ahead with the MRI scheduled for Tuesday, July 1 at 5:00pm at [facility].  Left pt vm with appt info.

## 2013-02-28 NOTE — Telephone Encounter (Signed)
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 Not really sure what to do. I just feel really bad today. My job is so demanding. If you have some suggestion on how I can manage while at work I welcome it .  Thanks Gabrielle Hawkins  Please call the pt and let her know that I am sorry she is having problems.  I really think that she should not wait for the MRI C-spine.  Am worried that some of this is from her very low B12.  For now, my only work suggestion would be to give her RX for cock up wrist splints to wear at work.  Also, please tell pt that would prefer if she sent correspondence through my chart or call me so that it is secure and documented properly.  Thanks!

## 2013-03-01 ENCOUNTER — Other Ambulatory Visit: Payer: 59

## 2013-03-04 ENCOUNTER — Encounter: Payer: 59 | Admitting: Neurology

## 2013-03-04 ENCOUNTER — Encounter (INDEPENDENT_AMBULATORY_CARE_PROVIDER_SITE_OTHER): Payer: 59

## 2013-03-04 ENCOUNTER — Ambulatory Visit (INDEPENDENT_AMBULATORY_CARE_PROVIDER_SITE_OTHER): Payer: 59 | Admitting: Neurology

## 2013-03-04 DIAGNOSIS — Z0289 Encounter for other administrative examinations: Secondary | ICD-10-CM

## 2013-03-04 DIAGNOSIS — R209 Unspecified disturbances of skin sensation: Secondary | ICD-10-CM

## 2013-03-04 DIAGNOSIS — G609 Hereditary and idiopathic neuropathy, unspecified: Secondary | ICD-10-CM

## 2013-03-04 DIAGNOSIS — R202 Paresthesia of skin: Secondary | ICD-10-CM

## 2013-03-04 NOTE — Procedures (Signed)
History of present illness:  47 years old right-handed [place] American female, with history of breast cancer, status post lobectomy followed by chemotherapy and therapy, now presenting with two-month history of right hand, and the right foot paresthesia, hypersensitivity to touch,  On examination: Bilateral upper and lower extremity motor strength was normal. Deep tendon reflexes were normal and present, she complains of right lower extremity radiating pain sensitivity, when I touch her right dorsum foot.  Nerve conduction:  Bilateral median, ulnar sensory and motor responses were normal.  Electromyography:  Selected needle examination was performed at right upper extremity muscles, and the right cervical paraspinal muscles.  Needle examination of right pronator teres, biceps, triceps, deltoid, brachioradialis, extensor digitorum communis was normal  There was no spontaneous activity at right cervical paraspinal muscles, right C5, 6, and 7  In conclusion:  This is a normal study. There is no electrodiagnostic evidence of right upper extremity neuropathy, or right cervical radiculopathy

## 2013-03-05 ENCOUNTER — Ambulatory Visit (HOSPITAL_COMMUNITY)
Admission: RE | Admit: 2013-03-05 | Discharge: 2013-03-05 | Disposition: A | Discharge status: Home / Self Care | Payer: 59 | Source: Ambulatory Visit | Attending: Neurology | Admitting: Neurology

## 2013-03-05 DIAGNOSIS — R209 Unspecified disturbances of skin sensation: Secondary | ICD-10-CM | POA: Insufficient documentation

## 2013-03-05 DIAGNOSIS — R202 Paresthesia of skin: Secondary | ICD-10-CM

## 2013-03-05 DIAGNOSIS — M79609 Pain in unspecified limb: Secondary | ICD-10-CM | POA: Insufficient documentation

## 2013-03-05 DIAGNOSIS — M5030 Other cervical disc degeneration, unspecified cervical region: Secondary | ICD-10-CM | POA: Insufficient documentation

## 2013-03-06 ENCOUNTER — Emergency Department (HOSPITAL_COMMUNITY)
Admission: EM | Admit: 2013-03-06 | Discharge: 2013-03-06 | Disposition: A | Discharge status: Home / Self Care | Payer: 59 | Source: Home / Self Care | Attending: Emergency Medicine | Admitting: Emergency Medicine

## 2013-03-06 ENCOUNTER — Encounter (HOSPITAL_COMMUNITY): Payer: Self-pay

## 2013-03-06 DIAGNOSIS — R55 Syncope and collapse: Secondary | ICD-10-CM

## 2013-03-06 DIAGNOSIS — H8110 Benign paroxysmal vertigo, unspecified ear: Secondary | ICD-10-CM

## 2013-03-06 LAB — POCT I-STAT, CHEM 8
BUN: 13 mg/dL (ref 6–23)
Chloride: 107 mEq/L (ref 96–112)
HCT: 41 % (ref 36.0–46.0)
Sodium: 142 mEq/L (ref 135–145)
TCO2: 25 mmol/L (ref 0–100)

## 2013-03-06 MED ORDER — MECLIZINE HCL 25 MG PO TABS: 25.0000 mg | ORAL_TABLET | Freq: Four times a day (QID) | ORAL | Status: DC

## 2013-03-06 NOTE — ED Provider Notes (Signed)
Chief Complaint:   Chief Complaint  Patient presents with  . 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 This occurred this past Monday, 3 days ago around 10 AM while at work. She was working on her computer, and felt dizzy, felt like her head was spinning, and couldn't focus. She stood up from her computer, felt sweaty, noted excessive thirst, and some shortness of breath and racing of her heart. She then fell back into her chair. She was unconscious for about a minute. She was observed by coworkers and there was no seizure activity no tongue biting, or incontinence of urine or stool. She denies any preceding or subsequent chest pain, tightness, or pressure. She woke up right away thereafter and felt weak for about an hour and a half and then washed out for the rest of the day. She went home and did not have any further symptoms. Yesterday she had an MRI because of some numbness in her hands. This was of her C-spine which showed degenerative disc disease but no impingement. She also had I nerve conduction study and EMG the day the incident happened. It was felt that the neuropathy is due to her chemotherapy and radiation therapy. Today she tried going back to work. While again sitting at her computer she had the same symptoms of whirling vertigo, thirst, shortness of breath, heart racing, sweats, and presyncope. She did not pass out today. She also notes blurry vision in her right lateral visual field which just started today, headache on the right side, and feeling off balance. She denies fever, chills, URI symptoms, coughing, wheezing, abdominal pain, nausea, vomiting, or diarrhea. She has no focal muscular weakness or paresthesias in the lower extremities.  Review of Systems:  Other than noted above, the patient denies any of the following symptoms. Systemic:  No fever, chills, or  fatigue. Pulmonary:  No cough, wheezing, shortness of breath. Cardiac:  No chest pain, tightness, pressure, palpitations, PND, orthopnea, or edema. Ext:  No leg pain or swelling. Neuro:  No weakness, paresthesias, or difficulty with speech or gait. No vertigo or difficujlty with coordination or balance. No seizure activity, tongue biting, or incontinence.  Psych:  No anxiety or depression.  [place]:  Past medical history, family history, social history, meds, and allergies were reviewed and updated as needed. No history of cardiac disease.  No history of excessive alcohol intake.  No family history of sudden death.  She has B12 deficiency, lymphedema, and had breast cancer in 2006 which was treated with surgery, radiation therapy, and chemotherapy. She has neuropathy from that. She has a number of medication allergies and sensitivities including ampicillin, Compazine, Cymbalta, Imitrex, Lyrica, metoclopramide, and Effexor. She takes albuterol, alprazolam, calcium carbonate, vitamin B12, Flexeril, Flonase, Neurontin, Megace, Mobic, Protonix, and Ultram.  Physical Exam:   Vital signs:  BP 143/91  Pulse 89  Temp(Src) 98.3 F (36.8 C) (Oral)  Resp 18  SpO2 100% Gen:  Alert, oriented, in no distress, skin warm and dry. She is unsteady on her feet and walking to the bathroom. Eye:  PERRL, lids and conjunctivas normal.  No stare or lid lag. ENT:  Mucous membranes moist, pharynx clear. Neck:  Supple, no adenopathy or tenderness.  No JVD.  Thyroid not enlarged. Lungs:  Clear to auscultation, no wheezes, rales or rhonchi.  No respiratory distress. Heart:  Regular rhythm, no extrasystoles.  No gallops, murmers, clicks or rubs. Abdomen:  Soft, nontender, no organomegaly or mass.  Bowel sounds normal.  No pulsatile abdominal mass or bruit. Ext:  No edema. Pulses full and equal. Neuro:  Neurological examination: The patient is alert and oriented x3. Speech is clear, fluent, and appropriate. Cranial nerves  are intact. There is no pronator drift and finger to nose was normal. Muscle strength, sensation, and DTRs are normal. Babinskis are downgoing. Gait is somewhat unsteady. Dix-Hallpike maneuver is markedly positive with the right ear down and reproduces the symptoms. Skin:  Warm and dry.  No rash.  Labs:   Results for orders placed during the hospital encounter of 03/06/13  POCT I-STAT, CHEM 8      Result Value Range   Sodium 142  135 - 145 mEq/L   Potassium 3.8  3.5 - 5.1 mEq/L   Chloride 107  96 - 112 mEq/L   BUN 13  6 - 23 mg/dL   Creatinine, Ser 0.80  0.50 - 1.10 mg/dL   Glucose, Bld 82  70 - 99 mg/dL   Calcium, Ion 1.24 (*) 1.12 - 1.23 mmol/L   TCO2 25  0 - 100 mmol/L   Hemoglobin 13.9  12.0 - 15.0 g/dL   HCT 41.0  36.0 - 46.0 %     EKG:   Date: 03/06/2013  Rate: 69  Rhythm: normal sinus rhythm  QRS Axis: normal  Intervals: normal  ST/T Wave abnormalities: normal  Conduction Disutrbances:none  Narrative Interpretation: Normal sinus rhythm, possible left atrial enlargement, left ventricular hypertrophy, otherwise normal.  Old EKG Reviewed: none available  Assessment:  The primary encounter diagnosis was Benign positional vertigo. A diagnosis of Vasovagal syncope was also pertinent to this visit.  I think her main issue is vertigo, probably due to canalithiasis. I explained to her about the etiology the symptoms. I think she had a vasovagal reaction in response to the vertiginous episode. There is no evidence of cardiac disease or CNS disease.  Plan:   1.  The following meds were prescribed:   Discharge Medication List as of 03/06/2013  5:50 PM    START taking these medications   Details  meclizine (ANTIVERT) 25 MG tablet Take 1 tablet (25 mg total) by mouth 4 (four) times daily., Starting 03/06/2013, Until Discontinued, Normal       2.  The patient was instructed in symptomatic care and handouts were given. She was instructed in Epley exercises. 3.  The patient was told to  return if becoming worse in any way, if no better in 3 or 4 days, and given some red flag symptoms including syncope, presyncope, dyspnea, new or changing neurological symptoms, or chest pain that would indicate earlier return. 4.  Follow up with Dr. Ruby Cola in a week if no improvement.     Harden Mo, MD 03/06/13 2111

## 2013-03-06 NOTE — ED Notes (Signed)
Discussed self care including not to drive for 1 week, take care when changing positions, plenty of fluids. Left in company of mother , who will drive

## 2013-03-06 NOTE — ED Notes (Signed)
Patient state she has been having dizzy spells, and states she "passed out " at work last week. Works in non-physical area , no dramatic temp changes in environment on job; denies n/v/d. No new medications,

## 2013-03-14 ENCOUNTER — Encounter: Payer: Self-pay | Admitting: Neurology

## 2013-03-14 ENCOUNTER — Ambulatory Visit (INDEPENDENT_AMBULATORY_CARE_PROVIDER_SITE_OTHER): Payer: 59 | Admitting: Neurology

## 2013-03-14 ENCOUNTER — Other Ambulatory Visit: Payer: Self-pay

## 2013-03-14 ENCOUNTER — Ambulatory Visit (INDEPENDENT_AMBULATORY_CARE_PROVIDER_SITE_OTHER): Payer: 59

## 2013-03-14 ENCOUNTER — Ambulatory Visit: Payer: 59 | Admitting: Neurology

## 2013-03-14 VITALS — BP 140/89 | HR 85 | Ht 68.0 in | Wt 180.0 lb

## 2013-03-14 DIAGNOSIS — H8110 Benign paroxysmal vertigo, unspecified ear: Secondary | ICD-10-CM

## 2013-03-14 DIAGNOSIS — I890 Lymphedema, not elsewhere classified: Secondary | ICD-10-CM

## 2013-03-14 MED ORDER — CYANOCOBALAMIN 1000 MCG/ML IJ SOLN: 1000.0000 ug | Freq: Once | INTRAMUSCULAR | Status: DC

## 2013-03-14 NOTE — Patient Instructions (Addendum)
Benign Positional Vertigo Vertigo means you feel like you or your surroundings are moving when they are not. Benign positional vertigo is the most common form of vertigo. Benign means that the cause of your condition is not serious. Benign positional vertigo is more common in older adults. CAUSES  Benign positional vertigo is the result of an upset in the labyrinth system. This is an area in the middle ear that helps control your balance. This may be caused by a viral infection, head injury, or repetitive motion. However, often no specific cause is found. SYMPTOMS  Symptoms of benign positional vertigo occur when you move your head or eyes in different directions. Some of the symptoms may include:  Loss of balance and falls.  Vomiting.  Blurred vision.  Dizziness.  Nausea.  Involuntary eye movements (nystagmus). DIAGNOSIS  Benign positional vertigo is usually diagnosed by physical exam. If the specific cause of your benign positional vertigo is unknown, your caregiver may perform imaging tests, such as magnetic resonance imaging (MRI) or computed tomography (CT). TREATMENT  Your caregiver may recommend movements or procedures to correct the benign positional vertigo. Medicines such as meclizine, benzodiazepines, and medicines for nausea may be used to treat your symptoms. In rare cases, if your symptoms are caused by certain conditions that affect the inner ear, you may need surgery. HOME CARE INSTRUCTIONS   Follow your caregiver's instructions.  Move slowly. Do not make sudden body or head movements.  Avoid driving.  Avoid operating heavy machinery.  Avoid performing any tasks that would be dangerous to you or others during a vertigo episode.  Drink enough fluids to keep your urine clear or pale yellow. SEEK IMMEDIATE MEDICAL CARE IF:   You develop problems with walking, weakness, numbness, or using your arms, hands, or legs.  You have difficulty speaking.  You develop  severe headaches.  Your nausea or vomiting continues or gets worse.  You develop visual changes.  Your family or friends notice any behavioral changes.  Your condition gets worse.  You have a fever.  You develop a stiff neck or sensitivity to light. MAKE SURE YOU:   Understand these instructions.  Will watch your condition.  Will get help right away if you are not doing well or get worse. Document Released: 05/30/2006 Document Revised: 11/14/2011 Document Reviewed: 05/12/2011 [facility] Patient Information 2014 [place].

## 2013-03-14 NOTE — Progress Notes (Signed)
[facility]  [name] DOB: 17-Jul-1966  HISTORICAL  Jaycey is a 47 years old right-handed African American female, referred by her primary care physician Dr. Willey Blade for evaluation of vertigo and also bilateral hands and right foot paresthesia.  She had a history of right-sided breast cancer, status post right lobectomy, followed by chemotherapy with adriamycin, Cytoxan, and radiation therapy.  She presented with bilateral hands pain for 2-3 months, she felt hand muscle spasm, paresthesia at her right dorsum hand, right worse than left, weakness in tying her shoes, difficulty writing with her right hand, she also complains of right-sided blurry vision, could not focus well with her right eye, EMG nerve conduction study was normal, there was no evidence of bilateral upper extremity neuropathy such as carpal tunnel, ulnar neuropathy no evidence of cervical radiculopathy,  She also complains of right-sided low back pain, radiating pain to her right lower extremities MRI of her back showed multilevel degenerative disc disease, there was no significant canal or foraminal stenosis MRI of cervical spine also showed multilevel degenerative disc disease.  She denies  gait difficulty, over the past few weeks, since July second she also developed intermittent vertigo, spinning sensation, lightheade  as if she is going to faint, getting worse when turning to the right side, last few minutes, overall better, but with sudden positional change, she is still symptomatic transiently    REVIEW OF SYSTEMS: Full 14 system review of systems performed and notable only for fatigue, blurred vision, joint pain, achy muscles, headache, numbness, weakness, dizziness, passing out, insomnia   ALLERGIES: Allergies  Allergen Reactions  . Ampicillin Hives and Itching  . Compazine (Prochlorperazine Maleate)     "Stroke-like symptoms"  . Cymbalta (Duloxetine Hcl)    Fainting  . Imitrex (Sumatriptan Base) Hives and Nausea And Vomiting  . Lyrica (Pregabalin) Other (See Comments)    Fainting  . Metoclopramide Hcl Hives  . Effexor (Venlafaxine Hydrochloride) Hives and Palpitations    HOME MEDICATIONS: Outpatient Prescriptions Prior to Visit  Medication Sig Dispense Refill  . albuterol (PROVENTIL HFA;VENTOLIN HFA) 108 (90 BASE) MCG/ACT inhaler Inhale 2 puffs into the lungs every 6 (six) hours as needed. For shortness of breath.      . ALPRAZolam (XANAX) 0.25 MG tablet Take 1 tablet (0.25 mg total) by mouth at bedtime as needed for sleep.  30 tablet  1  . calcium carbonate (OS-CAL) 1250 MG chewable tablet Chew 1 tablet by mouth daily.      . cholecalciferol (VITAMIN D) 1000 UNITS tablet Take 1,000 Units by mouth daily.      . Cyanocobalamin (VITAMIN B-12 IJ) Inject 1,000 mcg as directed every 30 (thirty) days.      . cyclobenzaprine (FLEXERIL) 5 MG tablet Take 1 tablet (5 mg total) by mouth 3 (three) times daily as needed. For joint pain.  90 tablet  3  . fluticasone (FLONASE) 50 MCG/ACT nasal spray Place 2 sprays into the nose daily.  16 g  0  . gabapentin (NEURONTIN) 300 MG capsule Take 1 capsule (300 mg total) by mouth 2 (two) times daily as needed.  180 capsule  3  . meclizine (ANTIVERT) 25 MG tablet Take 1 tablet (25 mg total) by mouth 4 (four) times daily.  28 tablet  0  . megestrol (MEGACE) 40 MG tablet Take 0.5 tablets (20 mg total) by mouth at bedtime.  45 tablet  0  . meloxicam (MOBIC) 7.5 MG tablet 1 tab daily  90  tablet  0  . pantoprazole (PROTONIX) 40 MG tablet Take 1 tablet (40 mg total) by mouth daily.  90 tablet  0  . traMADol (ULTRAM) 50 MG tablet Take 1 tablet (50 mg total) by mouth every 6 (six) hours as needed for pain.  30 tablet  3   No facility-administered medications prior to visit.    PAST MEDICAL HISTORY: Past Medical History  Diagnosis Date  . Neuropathy   . Fibroid   . Endometriosis   . Mucinous cystadenoma of ovary left    . BRCA1 negative   . BRCA2 negative   . History of chemotherapy 01/2005  . Radiation 04/2005  . Hx of tamoxifen therapy   . Migraines     just at dx of breast CA  . Arthritis   . Vitamin D deficiency   . Lymphedema of arm   . Breast cancer right ( Dr. Eston Esters) 2006    chem/radiation/2years tamoxifen, adrenomyocin and cytoxin    PAST SURGICAL HISTORY: Past Surgical History  Procedure Laterality Date  . Tonsillectomy    . Myomectomy    . Abdominal hysterectomy  2007    TAH/BSO  . Dilation and curettage of uterus    . Breast surgery right  2006    LUMPECTOMY    FAMILY HISTORY: Family History  Problem Relation Age of Onset  . Breast cancer Mother 30  . Heart failure Father   . Heart disease Father   . Cancer Maternal Grandmother     COLON  . Heart failure Paternal Grandmother   . Heart disease Paternal Grandmother     SOCIAL HISTORY:  History   Social History  . Marital Status: Divorced    Spouse Name: N/A    Number of Children: N/A  . Years of Education: N/A   Occupational History  .  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 Smoking status: Never Smoker   . Smokeless tobacco: Never Used  . Alcohol Use: No  . Drug Use: No  . Sexually Active: No     Comment: HYST   Other Topics Concern  . Not on file   Social History Narrative   Patient lives at home with her nephew.    Patient has 2 years of college education.    Patient has 0 children.    Patient has a boyfriend.            PHYSICAL EXAM    Filed Vitals:   03/14/13 1022 03/14/13 1024  BP: 131/86 140/89  Pulse: 85   Height: 5' 8"  (1.727 m)   Weight: 180 lb (81.647 kg)    Body mass index is 27.38 kg/(m^2).   Generalized: In no acute distress  Neck: Supple, no carotid bruits   Cardiac: Regular rate rhythm  Pulmonary: Clear to auscultation bilaterally  Musculoskeletal: No deformity  Neurological examination  Epiley's  maneuver: when lying on her  right side, she complains of transient dizziness, vertigo, there is rotatory nystagmus after short intubation period of time.   Mentation: Alert oriented to time, place, history taking, and causual conversation  Cranial nerve II-XII: Pupils were equal round reactive to light extraocular movements were full, visual field were full on confrontational test. facial sensation and strength were normal. hearing was intact to finger rubbing bilaterally. Uvula tongue midline.  head turning and shoulder shrug and were normal and symmetric.Tongue protrusion into cheek strength was normal.  Motor: normal tone, bulk and strength.  Sensory: Intact to fine touch, pinprick, preserved vibratory sensation, and proprioception at toes.  Coordination: Normal finger to nose, heel-to-shin bilaterally there was no truncal ataxia  Gait: Rising up from seated position without assistance, normal stance, without trunk ataxia, moderate stride, good arm swing, smooth turning, able to perform tiptoe, and heel walking without difficulty.   Romberg signs: Negative  Deep tendon reflexes: Brachioradialis 2/2, biceps 2/2, triceps 2/2, patellar 2/2, Achilles 2/2, plantar responses were flexor bilaterally.   DIAGNOSTIC DATA (LABS, IMAGING, TESTING) - I reviewed patient records, labs, notes, testing and imaging myself where available.  Lab Results  Component Value Date   WBC 9.7 09/21/2012   HGB 13.9 03/06/2013   HCT 41.0 03/06/2013   MCV 88.0 09/21/2012   PLT 189 09/21/2012      Component Value Date/Time   NA 142 03/06/2013 1656   NA 139 09/21/2012 1140   K 3.8 03/06/2013 1656   K 3.5 09/21/2012 1140   CL 107 03/06/2013 1656   CL 107 09/21/2012 1140   CO2 26 02/14/2013 1449   CO2 22 09/21/2012 1140   GLUCOSE 82 03/06/2013 1656   GLUCOSE 101* 09/21/2012 1140   BUN 13 03/06/2013 1656   BUN 17.0 09/21/2012 1140   CREATININE 0.80 03/06/2013 1656   CREATININE 0.9 09/21/2012 1140   CALCIUM 9.8 02/14/2013 1449   CALCIUM 9.6 09/21/2012 1140    PROT 7.8 09/21/2012 1140   PROT 6.8 02/22/2012 1332   ALBUMIN 3.8 09/21/2012 1140   ALBUMIN 4.3 02/22/2012 1332   AST 12 09/21/2012 1140   AST 13 02/22/2012 1332   ALT 11 09/21/2012 1140   ALT 10 02/22/2012 1332   ALKPHOS 85 09/21/2012 1140   ALKPHOS 76 02/22/2012 1332   BILITOT 0.58 09/21/2012 1140   BILITOT 0.4 02/22/2012 1332   GFRNONAA >60 01/20/2010 0935   GFRAA  Value: >60        The eGFR has been calculated using the MDRD equation. This calculation has not been validated in all clinical situations. eGFR's persistently <60 mL/min signify possible Chronic Kidney Disease. 01/20/2010 0935   Lab Results  Component Value Date   CHOL 188 12/24/2009   HDL 54 12/24/2009   LDLCALC 124* 12/24/2009   TRIG 49 12/24/2009   CHOLHDL 3.5 12/24/2009   No results found for this basename: HGBA1C   Lab Results  Component Value Date   VITAMINB12 150* 02/14/2013   Lab Results  Component Value Date   TSH 2.307 07/08/2011   ASSESSMENT AND PLAN   47 years old [place] American female, presenting with transient  vertigo, most consistent with benign positional vertigo, however with her continued recurrent symptoms, history of breast cancer, and right-sided blurry vision, will proceed with MRI of the brain with and without contrast to rule out central nervous system structural lesion,   Marcial Pacas, M.D. Ph.D.  [facility] [address] [place], [place] [address] [phone]

## 2013-03-15 MED ORDER — GADOPENTETATE DIMEGLUMINE 469.01 MG/ML IV SOLN: 17.0000 mL | Freq: Once | INTRAVENOUS | Status: AC | PRN

## 2013-03-20 ENCOUNTER — Telehealth: Payer: Self-pay

## 2013-03-20 ENCOUNTER — Encounter: Payer: Self-pay | Admitting: Neurology

## 2013-03-20 ENCOUNTER — Other Ambulatory Visit: Payer: Self-pay | Admitting: Oncology

## 2013-03-20 ENCOUNTER — Other Ambulatory Visit: Payer: Self-pay | Admitting: Gynecologic Oncology

## 2013-03-20 DIAGNOSIS — C50911 Malignant neoplasm of unspecified site of right female breast: Secondary | ICD-10-CM

## 2013-03-20 DIAGNOSIS — C50919 Malignant neoplasm of unspecified site of unspecified female breast: Secondary | ICD-10-CM

## 2013-03-20 MED ORDER — MEGESTROL ACETATE 40 MG PO TABS: 20.0000 mg | ORAL_TABLET | Freq: Every day | ORAL | Status: DC

## 2013-03-20 NOTE — Addendum Note (Signed)
Addended by: Cherylynn Ridges on: 03/20/2013 02:48 PM   Modules accepted: Orders

## 2013-03-20 NOTE — Telephone Encounter (Signed)
Patient wants to know if she can labs done to rule out Lupus. Patient can have labs done here. Patient is still feeling weak. Patient states B 12 injections are not really helping.

## 2013-03-21 ENCOUNTER — Telehealth: Payer: Self-pay | Admitting: Neurology

## 2013-03-21 ENCOUNTER — Encounter: Payer: Self-pay | Admitting: Women's Health

## 2013-03-21 DIAGNOSIS — C50911 Malignant neoplasm of unspecified site of right female breast: Secondary | ICD-10-CM

## 2013-03-21 DIAGNOSIS — H8110 Benign paroxysmal vertigo, unspecified ear: Secondary | ICD-10-CM

## 2013-03-21 DIAGNOSIS — I890 Lymphedema, not elsewhere classified: Secondary | ICD-10-CM

## 2013-03-21 NOTE — Telephone Encounter (Signed)
I have talked with Gabrielle Hawkins, MRI of the brain was normal.  She continues to complain of bilateral hands paresthesia, achy pain, I will order inflammatory markers.

## 2013-03-22 ENCOUNTER — Other Ambulatory Visit: Payer: Self-pay | Admitting: Neurology

## 2013-03-22 ENCOUNTER — Ambulatory Visit (INDEPENDENT_AMBULATORY_CARE_PROVIDER_SITE_OTHER): Payer: 59 | Admitting: Women's Health

## 2013-03-22 ENCOUNTER — Encounter: Payer: Self-pay | Admitting: Women's Health

## 2013-03-22 DIAGNOSIS — R8290 Unspecified abnormal findings in urine: Secondary | ICD-10-CM

## 2013-03-22 DIAGNOSIS — R82998 Other abnormal findings in urine: Secondary | ICD-10-CM

## 2013-03-22 LAB — URINALYSIS W MICROSCOPIC + REFLEX CULTURE
Glucose, UA: NEGATIVE mg/dL
Leukocytes, UA: NEGATIVE
Nitrite: NEGATIVE
Protein, ur: NEGATIVE mg/dL
WBC, UA: NONE SEEN WBC/hpf (ref <3)
pH: 6 (ref 5.0–8.0)

## 2013-03-22 NOTE — Progress Notes (Signed)
Patient ID: Gabrielle Hawkins, female   DOB: 07-12-1966, 47 y.o.   MRN: [id number] presents with complaint of urine with strong odor and fatigue. States has joint pain and generalized achiness. Reports feeling frustrated with not feeling well. Denies pain, burning, frequency of urination. Denies vaginal discharge. Not sexually active. TAH with BSO in 2007 fibroids and benign cyst. Breast cancer 2006, lumpectomy, chemotherapy, radiation, tamoxifen for one year. Primary care  started on vitamin B12 shots last week. Having work stress.   Exam: UA: Trace blood, 0 did 2 RBCs, rare bacteria. No CVAT. Tearful,   Situational stress versus depression Fatigue Urine odor  Plan: Urine culture pending. Increase by mouth fluids. If continued fatigue after several weeks on vitamin B12 injections instructed to  followup with primary care for possible antidepressant.

## 2013-03-23 LAB — CK: Total CK: 99 U/L (ref 24–173)

## 2013-03-23 LAB — THYROID PANEL WITH TSH
T3 Uptake Ratio: 26 % (ref 24–39)
T4, Total: 8.4 ug/dL (ref 4.5–12.0)
TSH: 2.16 u[IU]/mL (ref 0.450–4.500)

## 2013-03-23 LAB — C-REACTIVE PROTEIN: CRP: 4.6 mg/L (ref 0.0–4.9)

## 2013-03-23 LAB — IRON AND TIBC: UIBC: 282 ug/dL (ref 150–375)

## 2013-03-23 LAB — SEDIMENTATION RATE: Sed Rate: 14 mm/hr (ref 0–32)

## 2013-03-23 LAB — ANA W/REFLEX IF POSITIVE: Anti Nuclear Antibody(ANA): NEGATIVE

## 2013-03-23 LAB — FERRITIN: Ferritin: 45 ng/mL (ref 15–150)

## 2013-03-24 ENCOUNTER — Other Ambulatory Visit: Payer: Self-pay | Admitting: Oncology

## 2013-03-24 MED ORDER — PANTOPRAZOLE SODIUM 40 MG PO TBEC: 40.0000 mg | DELAYED_RELEASE_TABLET | Freq: Every day | ORAL | Status: DC

## 2013-03-24 MED ORDER — MEGESTROL ACETATE 40 MG PO TABS: 20.0000 mg | ORAL_TABLET | Freq: Every day | ORAL | Status: DC

## 2013-03-24 MED ORDER — MELOXICAM 7.5 MG PO TABS: ORAL_TABLET | ORAL | Status: DC

## 2013-03-25 LAB — URINE CULTURE: Colony Count: 70000

## 2013-03-25 NOTE — Progress Notes (Signed)
Quick Note:  Left message that MRI brain results are normal, per Dr. Krista Blue. ______

## 2013-03-25 NOTE — Progress Notes (Signed)
Quick Note:  Please call patient, essential normal labs. ______

## 2013-03-26 ENCOUNTER — Telehealth: Payer: Self-pay

## 2013-03-26 ENCOUNTER — Encounter: Payer: Self-pay | Admitting: Women's Health

## 2013-03-26 NOTE — Telephone Encounter (Signed)
Patient sent email: "Juliann Pulse or Izora Gala could you fax over a Doctor's excuse for Friday 03/22/13, I did have an appointment to see Izora Gala Friday fax # [phone], I didn't work Monday I don't know if Izora Gala would include Monday as well, due to not feeling well from UTI. If you need to reach me try me at work [phone] ext [phone], I keep me cell off while at work."  Izora Gala, I emailed patient back and let her know you are off on Tuesdays and I felt you would probably be agreeable to this but I cannot write note to excuse her without your permission. I am happy to type letter if you are okay with supporting her Fri, Mon absence.

## 2013-03-26 NOTE — Telephone Encounter (Signed)
Yes please send a note excusing her from work on Monday and Friday.  thanks

## 2013-03-26 NOTE — Progress Notes (Signed)
Quick Note:  Spoke to patient and relayed lab results, per Dr. Krista Blue. ______

## 2013-03-27 ENCOUNTER — Other Ambulatory Visit: Payer: Self-pay | Admitting: Women's Health

## 2013-03-27 ENCOUNTER — Encounter: Payer: Self-pay | Admitting: Neurology

## 2013-03-27 DIAGNOSIS — N390 Urinary tract infection, site not specified: Secondary | ICD-10-CM

## 2013-03-27 MED ORDER — NITROFURANTOIN MONOHYD MACRO 100 MG PO CAPS: 100.0000 mg | ORAL_CAPSULE | Freq: Two times a day (BID) | ORAL | Status: DC

## 2013-03-27 NOTE — Telephone Encounter (Signed)
Patient informed I will be faxing note prior to faxing it. Letter sent.

## 2013-04-18 ENCOUNTER — Encounter: Payer: Self-pay | Admitting: Women's Health

## 2013-04-24 ENCOUNTER — Ambulatory Visit: Payer: Self-pay | Admitting: Neurology

## 2013-05-08 ENCOUNTER — Encounter (HOSPITAL_COMMUNITY): Payer: Self-pay | Admitting: *Deleted

## 2013-05-08 ENCOUNTER — Emergency Department (HOSPITAL_COMMUNITY)
Admission: EM | Admit: 2013-05-08 | Discharge: 2013-05-08 | Disposition: A | Discharge status: Home / Self Care | Payer: 59 | Source: Home / Self Care | Attending: Family Medicine | Admitting: Family Medicine

## 2013-05-08 ENCOUNTER — Emergency Department (INDEPENDENT_AMBULATORY_CARE_PROVIDER_SITE_OTHER): Payer: 59

## 2013-05-08 DIAGNOSIS — J9801 Acute bronchospasm: Secondary | ICD-10-CM

## 2013-05-08 MED ORDER — ALBUTEROL SULFATE HFA 108 (90 BASE) MCG/ACT IN AERS: 1.0000 | INHALATION_SPRAY | Freq: Four times a day (QID) | RESPIRATORY_TRACT | Status: DC | PRN

## 2013-05-08 MED ORDER — ALBUTEROL SULFATE (5 MG/ML) 0.5% IN NEBU
INHALATION_SOLUTION | RESPIRATORY_TRACT | Status: AC
  Filled 2013-05-08: qty 1

## 2013-05-08 MED ORDER — BECLOMETHASONE DIPROPIONATE 80 MCG/ACT IN AERS: 1.0000 | INHALATION_SPRAY | RESPIRATORY_TRACT | Status: DC | PRN

## 2013-05-08 MED ORDER — ALBUTEROL SULFATE (5 MG/ML) 0.5% IN NEBU
5.0000 mg | INHALATION_SOLUTION | Freq: Once | RESPIRATORY_TRACT | Status: AC
  Administered 2013-05-08: 5 mg via RESPIRATORY_TRACT

## 2013-05-08 NOTE — ED Provider Notes (Signed)
CSN: [id number]     Arrival date & time 05/08/13  2 History   First MD Initiated Contact with Patient 05/08/13 1258     Chief Complaint  Patient presents with  . 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 She felt she was wheezing and coughing so badly that she almost vomited. She is feeling better now although she has mild lightheadedness that is improving but complaining of tightness and heaviness over the anterior chest. She has no known history of CAD or pulmonary disease.  Patient is a 47 y.o. female presenting with chest pain.  Chest Pain Associated symptoms: cough, nausea and shortness of breath   Associated symptoms: no fatigue, no fever and no palpitations     Past Medical History  Diagnosis Date  . Neuropathy   . Fibroid   . Endometriosis   . Mucinous cystadenoma of ovary     left  . BRCA1 negative   . BRCA2 negative   . History of chemotherapy 01/2005  . Radiation 04/2005  . Hx of tamoxifen therapy   . Migraines     just at dx of breast CA  . Arthritis   . Vitamin D deficiency   . Lymphedema of arm   . Breast cancer 2006    chem/radiation/2years tamoxifen   Past Surgical History  Procedure Laterality Date  . Tonsillectomy    . Myomectomy    . Abdominal hysterectomy  2007    TAH/BSO  . Dilation and curettage of uterus    . Breast surgery  2006    LUMPECTOMY   Family History  Problem Relation Age of Onset  . Breast cancer Mother 10  . Heart failure Father   . Heart disease Father   . Cancer Maternal Grandmother     COLON  . Heart failure Paternal Grandmother   . Heart disease Paternal Grandmother    History  Substance Use Topics  . Smoking status:  Never Smoker   . Smokeless tobacco: Never Used  . Alcohol Use: No   OB History   Grav Para Term Preterm Abortions TAB SAB Ect Mult Living   1 0   1  1   0     Review of Systems  Constitutional: Positive for activity change. Negative for fever and fatigue.  HENT: Negative.   Respiratory: Positive for cough, choking, chest tightness, shortness of breath and wheezing.   Cardiovascular: Positive for chest pain. Negative for palpitations.  Gastrointestinal: Positive for nausea.       Associated with coughing.  Genitourinary: Negative.   Allergic/Immunologic: Positive for environmental allergies.  Neurological: Positive for light-headedness. Negative for seizures, syncope and speech difficulty.  Psychiatric/Behavioral: Negative.     Allergies  Ampicillin; Compazine; Cymbalta; Imitrex; Lyrica; Metoclopramide hcl; and Effexor  Home Medications   Current Outpatient Rx  Name  Route  Sig  Dispense  Refill  . albuterol (PROVENTIL HFA;VENTOLIN HFA) 108 (90 BASE) MCG/ACT inhaler   Inhalation   Inhale 2 puffs into the lungs every 6 (six) hours as needed. For shortness of breath.         [name] albuterol (PROVENTIL HFA;VENTOLIN HFA) 108 (90 BASE) MCG/ACT inhaler   Inhalation   Inhale 1-2 puffs into the lungs every 6 (six) hours as needed for  wheezing.   1 Inhaler   0   . ALPRAZolam (XANAX) 0.25 MG tablet   Oral   Take 1 tablet (0.25 mg total) by mouth at bedtime as needed for sleep.   30 tablet   1   . beclomethasone (QVAR) 80 MCG/ACT inhaler   Inhalation   Inhale 1 puff into the lungs as needed.   1 Inhaler   12   . cholecalciferol (VITAMIN D) 1000 UNITS tablet   Oral   Take 1,000 Units by mouth daily.         . cyanocobalamin (,VITAMIN B-12,) 1000 MCG/ML injection   Intramuscular   Inject 1 mL (1,000 mcg total) into the muscle once. 1088mg im qday xone week, then q week xone month, then im q month   20 mL   3   . Cyanocobalamin (VITAMIN B-12 IJ)   Injection   Inject  1,000 mcg as directed every 30 (thirty) days.         . cyclobenzaprine (FLEXERIL) 5 MG tablet   Oral   Take 1 tablet (5 mg total) by mouth 3 (three) times daily as needed. For joint pain.   90 tablet   3   . fluticasone (FLONASE) 50 MCG/ACT nasal spray   Nasal   Place 2 sprays into the nose daily.   16 g   0   . gabapentin (NEURONTIN) 300 MG capsule   Oral   Take 1 capsule (300 mg total) by mouth 2 (two) times daily as needed.   180 capsule   3   . meclizine (ANTIVERT) 25 MG tablet   Oral   Take 1 tablet (25 mg total) by mouth 4 (four) times daily.   28 tablet   0   . megestrol (MEGACE) 40 MG tablet   Oral   Take 0.5 tablets (20 mg total) by mouth at bedtime.   45 tablet   3   . meloxicam (MOBIC) 7.5 MG tablet      1 tab daily   90 tablet   0   . nitrofurantoin, macrocrystal-monohydrate, (MACROBID) 100 MG capsule   Oral   Take 1 capsule (100 mg total) by mouth 2 (two) times daily.   10 capsule   0     Test of cure urine culture recommended 2 weeks aft ...   . pantoprazole (PROTONIX) 40 MG tablet   Oral   Take 1 tablet (40 mg total) by mouth daily.   90 tablet   0   . traMADol (ULTRAM) 50 MG tablet   Oral   Take 1 tablet (50 mg total) by mouth every 6 (six) hours as needed for pain.   30 tablet   3    BP 143/84  Pulse 92  SpO2 99% Physical Exam  Nursing note and vitals reviewed. Constitutional: She is oriented to person, place, and time. She appears well-developed and well-nourished. No distress.  In the exam room the patient appears calm and with a relaxed posturing. She is in no acute distress. She is warm and dry, normal respiratory rate and occasionally smiles.  HENT:  Mouth/Throat: Oropharynx is clear and moist. No oropharyngeal exudate.  Eyes: Conjunctivae and EOM are normal. Pupils are equal, round, and reactive to light.  Neck: Normal range of motion. Neck supple.  Cardiovascular: Normal rate, regular rhythm, normal heart sounds and  intact distal pulses.   No murmur heard. Pulmonary/Chest: Effort normal and breath sounds normal. No respiratory distress.  Rare, distant, faint expiratory  coarseness auscultated with a few breaths only.  Abdominal: Soft. She exhibits no distension. There is no tenderness.  Musculoskeletal: She exhibits no edema and no tenderness.  Lymphadenopathy:    She has no cervical adenopathy.  Neurological: She is alert and oriented to person, place, and time. She exhibits normal muscle tone.  Skin: Skin is warm.  Psychiatric: She has a normal mood and affect.    ED Course  Procedures (including critical care time) Labs Review Labs Reviewed - No data to display Imaging Review Dg Chest 2 View  05/08/2013   CLINICAL DATA:  47 year old female with acute onset coughing, chest pain, shortness of breath. History breast cancer.  EXAM: CHEST  2 VIEW  COMPARISON:  PET-CT 10/13/2011. Chest radiographs 06/27/2008.  FINDINGS: Lower lung volumes on the PA view. Cardiac and mediastinal contours are stable and within normal limits. Stable scoliosis and mild pectus excavatum deformity. No pneumothorax, pulmonary edema, pleural effusion or acute pulmonary opacity. No acute osseous abnormality identified.  IMPRESSION: Lower lung volumes, otherwise no acute cardiopulmonary abnormality.   Electronically Signed   By: Lars Pinks   On: 05/08/2013 14:41   EKG: Normal sinus rhythm, no ischemic changes or ectopy. No change in comparison EKG from earlier this year. Dg Chest 2 View  05/08/2013   CLINICAL DATA:  47 year old female with acute onset coughing, chest pain, shortness of breath. History breast cancer.  EXAM: CHEST  2 VIEW  COMPARISON:  PET-CT 10/13/2011. Chest radiographs 06/27/2008.  FINDINGS: Lower lung volumes on the PA view. Cardiac and mediastinal contours are stable and within normal limits. Stable scoliosis and mild pectus excavatum deformity. No pneumothorax, pulmonary edema, pleural effusion or acute pulmonary  opacity. No acute osseous abnormality identified.  IMPRESSION: Lower lung volumes, otherwise no acute cardiopulmonary abnormality.   Electronically Signed   By: Lars Pinks   On: 05/08/2013 14:41    MDM   1. Bronchospasm, acute     Post albuterol nebulizer patient states that she is feeling and breathing better. The tightness and fullness has diminished significantly. Chest x-ray is normal. The patient continues to feel better. We will discharge her on a new prescription for albuterol HFA and Qvar 80 mg. May return for any new symptoms problems or worsening.   Janne Napoleon, NP 05/08/13 [phone]

## 2013-05-08 NOTE — ED Notes (Signed)
Went to get patient for CXR. Receiving breathing treatment

## 2013-05-08 NOTE — ED Notes (Signed)
Pt is here with complaints of airwary reaction to lysol sprayed in dept.  Pt states she had a coughing episode after exposure and then developed mid sternal chest tightness that is constant and radiates up neck and around chest.  Pt has history of breast CA with chemotherapy.

## 2013-05-11 NOTE — ED Provider Notes (Signed)
Medical screening examination/treatment/procedure(s) were performed by non-physician practitioner and as supervising physician I was immediately available for consultation/collaboration.   [facility]; MD  Randa Spike, MD 05/11/13 [phone]

## 2013-06-13 ENCOUNTER — Other Ambulatory Visit: Payer: Self-pay | Admitting: Oncology

## 2013-07-09 ENCOUNTER — Other Ambulatory Visit: Payer: Self-pay | Admitting: Gastroenterology

## 2013-07-09 DIAGNOSIS — R1011 Right upper quadrant pain: Secondary | ICD-10-CM

## 2013-07-10 ENCOUNTER — Other Ambulatory Visit: Payer: Self-pay | Admitting: Women's Health

## 2013-07-11 ENCOUNTER — Other Ambulatory Visit: Payer: Self-pay

## 2013-07-15 ENCOUNTER — Ambulatory Visit (HOSPITAL_COMMUNITY): Payer: 59

## 2013-08-01 ENCOUNTER — Ambulatory Visit: Payer: 59 | Admitting: Oncology

## 2013-08-01 ENCOUNTER — Other Ambulatory Visit: Payer: 59 | Admitting: Lab

## 2013-09-24 ENCOUNTER — Telehealth: Payer: Self-pay | Admitting: Neurology

## 2013-09-24 NOTE — Telephone Encounter (Signed)
Spoke w/ Gabrielle Hawkins. She is aware of above and will speak with Dr. Krista Blue about completing the paperwork / [name]

## 2013-09-24 NOTE — Telephone Encounter (Signed)
I got form from pt for handicap placard.  Please let pt know that I don't think that I can fill that out.  Dr Krista Blue saw her last and did her testing.  IF she feels that she cannot ambulate well enough, then she could have an FCE done that is an involved examination by a specialist to report what pt can/cannot do (walk/push/pull/lift), etc but right now, I just don't have documentation or notes to support that she fulfills criteria for that placard.

## 2013-09-25 ENCOUNTER — Telehealth: Payer: Self-pay

## 2013-09-25 NOTE — Telephone Encounter (Signed)
Please let patients know, last clinical visit in July 2014, there was no significant abnormality on examination, she can contact her primary care for handicapped sticker

## 2013-09-25 NOTE — Telephone Encounter (Signed)
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

## 2013-09-25 NOTE — Telephone Encounter (Signed)
Called and left patient a message stating she Had not been since 2014 and if she could ask her pcp to do sticker.  Told patient office back if any questions I will be glad to schedule her follow up at.

## 2013-10-24 ENCOUNTER — Encounter (HOSPITAL_COMMUNITY): Payer: Self-pay | Admitting: Emergency Medicine

## 2013-10-24 ENCOUNTER — Emergency Department (HOSPITAL_COMMUNITY)
Admission: EM | Admit: 2013-10-24 | Discharge: 2013-10-24 | Disposition: A | Discharge status: Home / Self Care | Payer: 59 | Source: Home / Self Care | Attending: Emergency Medicine | Admitting: Emergency Medicine

## 2013-10-24 DIAGNOSIS — T148XXA Other injury of unspecified body region, initial encounter: Secondary | ICD-10-CM

## 2013-10-24 DIAGNOSIS — N390 Urinary tract infection, site not specified: Secondary | ICD-10-CM

## 2013-10-24 LAB — POCT URINALYSIS DIP (DEVICE)
Bilirubin Urine: NEGATIVE
Glucose, UA: NEGATIVE mg/dL
KETONES UR: NEGATIVE mg/dL
Nitrite: POSITIVE — AB
PH: 6 (ref 5.0–8.0)
PROTEIN: NEGATIVE mg/dL
UROBILINOGEN UA: 0.2 mg/dL (ref 0.0–1.0)

## 2013-10-24 MED ORDER — CYCLOBENZAPRINE HCL 5 MG PO TABS: 5.0000 mg | ORAL_TABLET | Freq: Three times a day (TID) | ORAL | Status: DC | PRN

## 2013-10-24 MED ORDER — CIPROFLOXACIN HCL 500 MG PO TABS: 500.0000 mg | ORAL_TABLET | Freq: Two times a day (BID) | ORAL | Status: DC

## 2013-10-24 MED ORDER — NAPROXEN 500 MG PO TABS: 500.0000 mg | ORAL_TABLET | Freq: Two times a day (BID) | ORAL | Status: DC

## 2013-10-24 NOTE — ED Notes (Signed)
C/o lower back pain States she does have an odor when she urinates  Admits to falling on Tuesday in her driveway; states she fell on her buttocks Aleve was taking for pain Denies any discharge

## 2013-10-24 NOTE — ED Provider Notes (Signed)
CSN: [id number]     Arrival date & time 10/24/13  1005 History   First MD Initiated Contact with Patient 10/24/13 1042     Chief Complaint  Patient presents with  . Back Pain     (Consider location/radiation/quality/duration/timing/severity/associated sxs/prior Treatment) HPI Comments: Denies any other injury when fell on ice besides L low back pain  Patient is a 48 y.o. female presenting with back pain and dysuria. The history is provided by the patient.  Back Pain Location:  Lumbar spine Quality:  Aching Radiates to:  Does not radiate Pain severity:  Moderate Pain is:  Same all the time Onset quality:  Sudden Duration:  2 days Timing:  Constant Progression:  Unchanged Chronicity:  New Context: falling   Context comment:  Slipped and fell on ice Relieved by:  Nothing Worsened by:  Palpation Ineffective treatments:  NSAIDs (warm bath) Associated symptoms: dysuria   Associated symptoms: no abdominal pain, no fever, no numbness and no weakness   Dysuria Pain quality:  Burning Pain severity:  Mild Onset quality:  Gradual Duration:  1 month Progression:  Worsening Chronicity:  New Recent urinary tract infections: no (last uti 03/2013, tx successfully with cipro)   Relieved by:  Nothing Worsened by:  Nothing tried Ineffective treatments:  None tried Urinary symptoms: discolored urine and foul-smelling urine   Associated symptoms: flank pain and nausea   Associated symptoms: no abdominal pain, no fever, no vaginal discharge and no vomiting     Past Medical History  Diagnosis Date  . Neuropathy   . Fibroid   . Endometriosis   . Mucinous cystadenoma of ovary     left  . BRCA1 negative   . BRCA2 negative   . History of chemotherapy 01/2005  . Radiation 04/2005  . Hx of tamoxifen therapy   . Migraines     just at dx of breast CA  . Arthritis   . Vitamin D deficiency   . Lymphedema of arm   . Breast cancer 2006    chem/radiation/2years tamoxifen   Past Surgical  History  Procedure Laterality Date  . Tonsillectomy    . Myomectomy    . Abdominal hysterectomy  2007    TAH/BSO  . Dilation and curettage of uterus    . Breast surgery  2006    LUMPECTOMY   Family History  Problem Relation Age of Onset  . Breast cancer Mother 47  . Heart failure Father   . Heart disease Father   . Cancer Maternal Grandmother     COLON  . Heart failure Paternal Grandmother   . Heart disease Paternal Grandmother    History  Substance Use Topics  . Smoking status: Never Smoker   . Smokeless tobacco: Never Used  . Alcohol Use: No   OB History   Grav Para Term Preterm Abortions TAB SAB Ect Mult Living   1 0   1  1   0     Review of Systems  Constitutional: Negative for fever and chills.  Gastrointestinal: Positive for nausea. Negative for vomiting and abdominal pain.  Genitourinary: Positive for dysuria and flank pain. Negative for vaginal discharge.  Musculoskeletal: Positive for back pain.  Skin: Negative for color change and wound.  Neurological: Negative for weakness and numbness.      Allergies  Ampicillin; Compazine; Cymbalta; Imitrex; Lyrica; Metoclopramide hcl; and Effexor  Home Medications   Current Outpatient Rx  Name  Route  Sig  Dispense  Refill  . albuterol (PROVENTIL  HFA;VENTOLIN HFA) 108 (90 BASE) MCG/ACT inhaler   Inhalation   Inhale 2 puffs into the lungs every 6 (six) hours as needed. For shortness of breath.         [name] albuterol (PROVENTIL HFA;VENTOLIN HFA) 108 (90 BASE) MCG/ACT inhaler   Inhalation   Inhale 1-2 puffs into the lungs every 6 (six) hours as needed for wheezing.   1 Inhaler   0   . ALPRAZolam (XANAX) 0.25 MG tablet   Oral   Take 1 tablet (0.25 mg total) by mouth at bedtime as needed for sleep.   30 tablet   1   . beclomethasone (QVAR) 80 MCG/ACT inhaler   Inhalation   Inhale 1 puff into the lungs as needed.   1 Inhaler   12   . cholecalciferol (VITAMIN D) 1000 UNITS tablet   Oral   Take 1,000 Units  by mouth daily.         . ciprofloxacin (CIPRO) 500 MG tablet   Oral   Take 1 tablet (500 mg total) by mouth every 12 (twelve) hours.   14 tablet   0   . cyanocobalamin (,VITAMIN B-12,) 1000 MCG/ML injection   Intramuscular   Inject 1 mL (1,000 mcg total) into the muscle once. 1057mg im qday xone week, then q week xone month, then im q month   20 mL   3   . Cyanocobalamin (VITAMIN B-12 IJ)   Injection   Inject 1,000 mcg as directed every 30 (thirty) days.         . cyclobenzaprine (FLEXERIL) 5 MG tablet   Oral   Take 1 tablet (5 mg total) by mouth 3 (three) times daily as needed. For joint pain.   90 tablet   3   . cyclobenzaprine (FLEXERIL) 5 MG tablet   Oral   Take 1 tablet (5 mg total) by mouth 3 (three) times daily as needed for muscle spasms.   30 tablet   0   . fluticasone (FLONASE) 50 MCG/ACT nasal spray   Nasal   Place 2 sprays into the nose daily.   16 g   0   . gabapentin (NEURONTIN) 300 MG capsule   Oral   Take 1 capsule (300 mg total) by mouth 2 (two) times daily as needed.   180 capsule   3   . meclizine (ANTIVERT) 25 MG tablet   Oral   Take 1 tablet (25 mg total) by mouth 4 (four) times daily.   28 tablet   0   . megestrol (MEGACE) 40 MG tablet   Oral   Take 0.5 tablets (20 mg total) by mouth at bedtime.   45 tablet   3   . meloxicam (MOBIC) 7.5 MG tablet      1 tab daily   90 tablet   0   . naproxen (NAPROSYN) 500 MG tablet   Oral   Take 1 tablet (500 mg total) by mouth 2 (two) times daily.   20 tablet   0   . nitrofurantoin, macrocrystal-monohydrate, (MACROBID) 100 MG capsule   Oral   Take 1 capsule (100 mg total) by mouth 2 (two) times daily.   10 capsule   0     Test of cure urine culture recommended 2 weeks aft ...   . pantoprazole (PROTONIX) 40 MG tablet   Oral   Take 1 tablet (40 mg total) by mouth daily.   90 tablet   0   . traMADol (ULTRAM) 50  MG tablet   Oral   Take 1 tablet (50 mg total) by mouth every 6  (six) hours as needed for pain.   30 tablet   3    BP 150/92  Pulse 79  Temp(Src) 98.6 F (37 C) (Oral)  Resp 18  SpO2 100% Physical Exam  Constitutional: She is oriented to person, place, and time. She appears well-developed and well-nourished.  Appears in pain, tearful  Cardiovascular: Normal rate and regular rhythm.   Pulmonary/Chest: Effort normal and breath sounds normal.  Abdominal: Soft. Bowel sounds are normal. She exhibits no distension. There is no tenderness. There is no rebound and no CVA tenderness.  Musculoskeletal:       Lumbar back: She exhibits tenderness. She exhibits no bony tenderness, no swelling and no deformity.       Back:  Neurological: She is alert and oriented to person, place, and time. Gait normal.  Skin: Skin is warm, dry and intact. No bruising noted. No erythema.    ED Course  Procedures (including critical care time) Labs Review Labs Reviewed  POCT URINALYSIS DIP (DEVICE) - Abnormal; Notable for the following:    Hgb urine dipstick MODERATE (*)    Nitrite POSITIVE (*)    Leukocytes, UA SMALL (*)    All other components within normal limits   Imaging Review No results found.    MDM   Final diagnoses:  UTI (lower urinary tract infection)  Contusion   rx flexeril 94m TID prn #30, cipro [id number] hrs #14, naproxen 50045mID #20. Pt to f/u with pcp in a week.      AngCarvel GettingP 10/24/13 1122

## 2013-10-24 NOTE — Discharge Instructions (Signed)
Try taking warm baths with epsom salt in it.  Follow up with Dr. Karlton Lemon next week. Finish all of the ciprofloxacin even if you are feeling better.   Urinary Tract Infection Urinary tract infections (UTIs) can develop anywhere along your urinary tract. Your urinary tract is your body's drainage system for removing wastes and extra water. Your urinary tract includes two kidneys, two ureters, a bladder, and a urethra. Your kidneys are a pair of bean-shaped organs. Each kidney is about the size of your fist. They are located below your ribs, one on each side of your spine. CAUSES Infections are caused by microbes, which are microscopic organisms, including fungi, viruses, and bacteria. These organisms are so small that they can only be seen through a microscope. Bacteria are the microbes that most commonly cause UTIs. SYMPTOMS  Symptoms of UTIs may vary by age and gender of the patient and by the location of the infection. Symptoms in young women typically include a frequent and intense urge to urinate and a painful, burning feeling in the bladder or urethra during urination. Older women and men are more likely to be tired, shaky, and weak and have muscle aches and abdominal pain. A fever may mean the infection is in your kidneys. Other symptoms of a kidney infection include pain in your back or sides below the ribs, nausea, and vomiting. DIAGNOSIS To diagnose a UTI, your caregiver will ask you about your symptoms. Your caregiver also will ask to provide a urine sample. The urine sample will be tested for bacteria and white blood cells. White blood cells are made by your body to help fight infection. TREATMENT  Typically, UTIs can be treated with medication. Because most UTIs are caused by a bacterial infection, they usually can be treated with the use of antibiotics. The choice of antibiotic and length of treatment depend on your symptoms and the type of bacteria causing your infection. HOME CARE  INSTRUCTIONS  If you were prescribed antibiotics, take them exactly as your caregiver instructs you. Finish the medication even if you feel better after you have only taken some of the medication.  Drink enough water and fluids to keep your urine clear or pale yellow.  Avoid caffeine, tea, and carbonated beverages. They tend to irritate your bladder.  Empty your bladder often. Avoid holding urine for long periods of time.  Empty your bladder before and after sexual intercourse.  After a bowel movement, women should cleanse from front to back. Use each tissue only once. SEEK MEDICAL CARE IF:   You have back pain.  You develop a fever.  Your symptoms do not begin to resolve within 3 days. SEEK IMMEDIATE MEDICAL CARE IF:   You have severe back pain or lower abdominal pain.  You develop chills.  You have nausea or vomiting.  You have continued burning or discomfort with urination. MAKE SURE YOU:   Understand these instructions.  Will watch your condition.  Will get help right away if you are not doing well or get worse. Document Released: 06/01/2005 Document Revised: 02/21/2012 Document Reviewed: 09/30/2011 [facility] Patient Information 2014 [place].  Contusion A contusion is a deep bruise. Contusions happen when an injury causes bleeding under the skin. Signs of bruising include pain, puffiness (swelling), and discolored skin. The contusion may turn blue, purple, or yellow. HOME CARE   Put ice on the injured area.  Put ice in a plastic bag.  Place a towel between your skin and the bag.  Leave the  ice on for 15-20 minutes, 03-04 times a day.  Only take medicine as told by your doctor.  Rest the injured area.  If possible, raise (elevate) the injured area to lessen puffiness. GET HELP RIGHT AWAY IF:   You have more bruising or puffiness.  You have pain that is getting worse.  Your puffiness or pain is not helped by medicine. MAKE SURE YOU:    Understand these instructions.  Will watch your condition.  Will get help right away if you are not doing well or get worse. Document Released: 02/08/2008 Document Revised: 11/14/2011 Document Reviewed: 06/27/2011 [facility] Patient Information 2014 [place], [place].

## 2013-10-24 NOTE — ED Provider Notes (Signed)
Medical screening examination/treatment/procedure(s) were performed by non-physician practitioner and as supervising physician I was immediately available for consultation/collaboration.  Philipp Deputy, M.D.  Harden Mo, MD 10/24/13 [phone]

## 2013-11-18 ENCOUNTER — Emergency Department (HOSPITAL_COMMUNITY)
Admission: EM | Admit: 2013-11-18 | Discharge: 2013-11-18 | Disposition: A | Discharge status: Home / Self Care | Payer: 59 | Attending: Emergency Medicine | Admitting: Emergency Medicine

## 2013-11-18 ENCOUNTER — Encounter (HOSPITAL_COMMUNITY): Payer: Self-pay | Admitting: Emergency Medicine

## 2013-11-18 ENCOUNTER — Emergency Department (HOSPITAL_COMMUNITY): Payer: 59

## 2013-11-18 DIAGNOSIS — R110 Nausea: Secondary | ICD-10-CM | POA: Insufficient documentation

## 2013-11-18 DIAGNOSIS — Z9221 Personal history of antineoplastic chemotherapy: Secondary | ICD-10-CM | POA: Insufficient documentation

## 2013-11-18 DIAGNOSIS — Z8739 Personal history of other diseases of the musculoskeletal system and connective tissue: Secondary | ICD-10-CM | POA: Insufficient documentation

## 2013-11-18 DIAGNOSIS — Z8742 Personal history of other diseases of the female genital tract: Secondary | ICD-10-CM | POA: Insufficient documentation

## 2013-11-18 DIAGNOSIS — R10A1 Flank pain, right side: Secondary | ICD-10-CM

## 2013-11-18 DIAGNOSIS — Z09 Encounter for follow-up examination after completed treatment for conditions other than malignant neoplasm: Secondary | ICD-10-CM | POA: Insufficient documentation

## 2013-11-18 DIAGNOSIS — Z87440 Personal history of urinary (tract) infections: Secondary | ICD-10-CM | POA: Insufficient documentation

## 2013-11-18 DIAGNOSIS — E559 Vitamin D deficiency, unspecified: Secondary | ICD-10-CM | POA: Insufficient documentation

## 2013-11-18 DIAGNOSIS — G43909 Migraine, unspecified, not intractable, without status migrainosus: Secondary | ICD-10-CM | POA: Insufficient documentation

## 2013-11-18 DIAGNOSIS — Z8679 Personal history of other diseases of the circulatory system: Secondary | ICD-10-CM | POA: Insufficient documentation

## 2013-11-18 DIAGNOSIS — Z853 Personal history of malignant neoplasm of breast: Secondary | ICD-10-CM | POA: Insufficient documentation

## 2013-11-18 DIAGNOSIS — Z90710 Acquired absence of both cervix and uterus: Secondary | ICD-10-CM | POA: Insufficient documentation

## 2013-11-18 DIAGNOSIS — Z79899 Other long term (current) drug therapy: Secondary | ICD-10-CM | POA: Insufficient documentation

## 2013-11-18 DIAGNOSIS — Z791 Long term (current) use of non-steroidal anti-inflammatories (NSAID): Secondary | ICD-10-CM | POA: Insufficient documentation

## 2013-11-18 DIAGNOSIS — IMO0002 Reserved for concepts with insufficient information to code with codable children: Secondary | ICD-10-CM | POA: Insufficient documentation

## 2013-11-18 DIAGNOSIS — Z880 Allergy status to penicillin: Secondary | ICD-10-CM | POA: Insufficient documentation

## 2013-11-18 DIAGNOSIS — Z8669 Personal history of other diseases of the nervous system and sense organs: Secondary | ICD-10-CM | POA: Insufficient documentation

## 2013-11-18 DIAGNOSIS — R109 Unspecified abdominal pain: Secondary | ICD-10-CM | POA: Insufficient documentation

## 2013-11-18 LAB — URINALYSIS, ROUTINE W REFLEX MICROSCOPIC
Bilirubin Urine: NEGATIVE
Glucose, UA: NEGATIVE mg/dL
HGB URINE DIPSTICK: NEGATIVE
KETONES UR: NEGATIVE mg/dL
LEUKOCYTES UA: NEGATIVE
Nitrite: NEGATIVE
PH: 6 (ref 5.0–8.0)
Protein, ur: NEGATIVE mg/dL
Specific Gravity, Urine: >1.03 — ABNORMAL HIGH (ref 1.005–1.030)
Urobilinogen, UA: 0.2 mg/dL (ref 0.0–1.0)

## 2013-11-18 MED ORDER — HYDROMORPHONE HCL PF 1 MG/ML IJ SOLN
1.0000 mg | Freq: Once | INTRAMUSCULAR | Status: AC
  Administered 2013-11-18: 1 mg via INTRAVENOUS
  Filled 2013-11-18: qty 1

## 2013-11-18 MED ORDER — ONDANSETRON HCL 4 MG/2ML IJ SOLN
4.0000 mg | Freq: Once | INTRAMUSCULAR | Status: AC
  Administered 2013-11-18: 4 mg via INTRAVENOUS
  Filled 2013-11-18: qty 2

## 2013-11-18 MED ORDER — OXYCODONE-ACETAMINOPHEN 5-325 MG PO TABS: 1.0000 | ORAL_TABLET | ORAL | Status: DC | PRN

## 2013-11-18 NOTE — ED Provider Notes (Signed)
CSN: [id number]     Arrival date & time 11/18/13  1700 History   First MD Initiated Contact with Patient 11/18/13 [phone]     Chief Complaint  Patient presents with  . Flank Pain      Patient is a 48 y.o. female presenting with flank pain. The history is provided by the patient.  Flank Pain This is a recurrent problem. The current episode started more than 1 week ago. The problem occurs constantly. The problem has been gradually worsening. Associated symptoms include abdominal pain. Pertinent negatives include no chest pain and no shortness of breath. Exacerbated by: movement. The symptoms are relieved by rest. She has tried rest for the symptoms. The treatment provided no relief.  pt reports falling on ice last month and injuring her back She reports soon after she was seen/diagnosed with UTI and started cipro but never improved Seen by PCP recently, had abdominal US that showed "possible kidney stones"  She reports over the weekend her pain became unbearable The pain starts in right flank and radiates to right lower abdomen   Past Medical History  Diagnosis Date  . Neuropathy   . Fibroid   . Endometriosis   . Mucinous cystadenoma of ovary     left  . BRCA1 negative   . BRCA2 negative   . History of chemotherapy 01/2005  . Radiation 04/2005  . Hx of tamoxifen therapy   . Migraines     just at dx of breast CA  . Arthritis   . Vitamin D deficiency   . Lymphedema of arm   . Breast cancer 2006    chem/radiation/2years tamoxifen   Past Surgical History  Procedure Laterality Date  . Tonsillectomy    . Myomectomy    . Abdominal hysterectomy  2007    TAH/BSO  . Dilation and curettage of uterus    . Breast surgery  2006    LUMPECTOMY   Family History  Problem Relation Age of Onset  . Breast cancer Mother 85  . Heart failure Father   . Heart disease Father   . Cancer Maternal Grandmother     COLON  . Heart failure Paternal Grandmother   . Heart disease Paternal Grandmother     History  Substance Use Topics  . Smoking status: Never Smoker   . Smokeless tobacco: Never Used  . Alcohol Use: No   OB History   Grav Para Term Preterm Abortions TAB SAB Ect Mult Living   1 0   1  1   0     Review of Systems  Constitutional: Positive for chills.  Respiratory: Negative for shortness of breath.   Cardiovascular: Negative for chest pain.  Gastrointestinal: Positive for nausea and abdominal pain.  Genitourinary: Positive for flank pain.  Neurological: Negative for weakness.  All other systems reviewed and are negative.      Allergies  Ampicillin; Compazine; Cymbalta; Imitrex; Lyrica; Metoclopramide hcl; and Effexor  Home Medications   Current Outpatient Rx  Name  Route  Sig  Dispense  Refill  . albuterol (PROVENTIL HFA;VENTOLIN HFA) 108 (90 BASE) MCG/ACT inhaler   Inhalation   Inhale 2 puffs into the lungs every 6 (six) hours as needed. For shortness of breath.         [name] albuterol (PROVENTIL HFA;VENTOLIN HFA) 108 (90 BASE) MCG/ACT inhaler   Inhalation   Inhale 1-2 puffs into the lungs every 6 (six) hours as needed for wheezing.   1 Inhaler   0   .  ALPRAZolam (XANAX) 0.25 MG tablet   Oral   Take 1 tablet (0.25 mg total) by mouth at bedtime as needed for sleep.   30 tablet   1   . beclomethasone (QVAR) 80 MCG/ACT inhaler   Inhalation   Inhale 1 puff into the lungs as needed.   1 Inhaler   12   . cholecalciferol (VITAMIN D) 1000 UNITS tablet   Oral   Take 1,000 Units by mouth daily.         . ciprofloxacin (CIPRO) 500 MG tablet   Oral   Take 1 tablet (500 mg total) by mouth every 12 (twelve) hours.   14 tablet   0   . cyanocobalamin (,VITAMIN B-12,) 1000 MCG/ML injection   Intramuscular   Inject 1 mL (1,000 mcg total) into the muscle once. 103mg im qday xone week, then q week xone month, then im q month   20 mL   3   . Cyanocobalamin (VITAMIN B-12 IJ)   Injection   Inject 1,000 mcg as directed every 30 (thirty) days.          . cyclobenzaprine (FLEXERIL) 5 MG tablet   Oral   Take 1 tablet (5 mg total) by mouth 3 (three) times daily as needed. For joint pain.   90 tablet   3   . cyclobenzaprine (FLEXERIL) 5 MG tablet   Oral   Take 1 tablet (5 mg total) by mouth 3 (three) times daily as needed for muscle spasms.   30 tablet   0   . fluticasone (FLONASE) 50 MCG/ACT nasal spray   Nasal   Place 2 sprays into the nose daily.   16 g   0   . gabapentin (NEURONTIN) 300 MG capsule   Oral   Take 1 capsule (300 mg total) by mouth 2 (two) times daily as needed.   180 capsule   3   . meclizine (ANTIVERT) 25 MG tablet   Oral   Take 1 tablet (25 mg total) by mouth 4 (four) times daily.   28 tablet   0   . megestrol (MEGACE) 40 MG tablet   Oral   Take 0.5 tablets (20 mg total) by mouth at bedtime.   45 tablet   3   . meloxicam (MOBIC) 7.5 MG tablet      1 tab daily   90 tablet   0   . naproxen (NAPROSYN) 500 MG tablet   Oral   Take 1 tablet (500 mg total) by mouth 2 (two) times daily.   20 tablet   0   . nitrofurantoin, macrocrystal-monohydrate, (MACROBID) 100 MG capsule   Oral   Take 1 capsule (100 mg total) by mouth 2 (two) times daily.   10 capsule   0     Test of cure urine culture recommended 2 weeks aft ...   . pantoprazole (PROTONIX) 40 MG tablet   Oral   Take 1 tablet (40 mg total) by mouth daily.   90 tablet   0   . traMADol (ULTRAM) 50 MG tablet   Oral   Take 1 tablet (50 mg total) by mouth every 6 (six) hours as needed for pain.   30 tablet   3    BP 131/78  Pulse 84  Temp(Src) 98.1 F (36.7 C)  Resp 16  Ht _0  (1.727 m)  Wt 177 lb (80.287 kg)  BMI 26.92 kg/m2  SpO2 98% Physical Exam CONSTITUTIONAL: Well developed/well nourished, anxious HEAD: Normocephalic/atraumatic  EYES: EOMI/PERRL ENMT: Mucous membranes moist NECK: supple no meningeal signs SPINE:entire spine nontender CV: S1/S2 noted, no murmurs/rubs/gallops noted LUNGS: Lungs are clear to  auscultation bilaterally, no apparent distress ABDOMEN: soft, nontender, no rebound or guarding YS:AYTKZ cva tenderness NEURO: Pt is awake/alert, moves all extremitiesx4 EXTREMITIES: pulses normal, full ROM SKIN: warm, color normal PSYCH: anxious  ED Course  Procedures   I advised need for f/u as outpatient CT imaging discussed She is ambulatory in the ED Stable for d/c home  Labs Review Labs Reviewed  URINALYSIS, ROUTINE W REFLEX MICROSCOPIC - Abnormal; Notable for the following:    Specific Gravity, Urine >1.030 (*)    All other components within normal limits   Imaging Review Ct Abdomen Pelvis Wo Contrast  11/18/2013   CLINICAL DATA:  Right-sided flank pain  EXAM: CT ABDOMEN AND PELVIS WITHOUT CONTRAST  TECHNIQUE: Multidetector CT imaging of the abdomen and pelvis was performed following the standard protocol without intravenous contrast.  COMPARISON:  10/13/2011  FINDINGS: The lung bases are free of acute infiltrate or sizable effusion.  The liver is homogeneous in attenuation with the exception of a rounded area of decreased density measuring 1 cm in the medial segment of left lobe of the liver. This is stable from the prior exam and should no significant activity on PET-CT to suggest neoplasm. This may represent a small cyst or hemangioma. The gallbladder, spleen, adrenal glands and pancreas are within normal limits. The kidneys show no evidence of renal calculi or obstructive changes. No mass lesions are noted.  The uterus is been surgically removed. The bladder is undistended. The appendix is partially visualized. No inflammatory change is noted. Bony structures show a chronic scoliosis. No acute abnormality is seen.  IMPRESSION: Stable hypodensity within the liver likely representing a small cyst or hemangioma.  No renal calculi or urinary tract obstructive changes are noted. No acute abnormality seen.   Electronically Signed   By: Inez Catalina M.D.   On: 11/18/2013 07:52       MDM   Final diagnoses:  Right flank pain    Nursing notes including past medical history and social history reviewed and considered in documentation Labs/vital reviewed and considered Previous records reviewed and considered - previous urgent care notes reviewed     Sharyon Cable, MD 11/18/13 [phone]

## 2013-11-18 NOTE — ED Notes (Signed)
Fell on ice 3 weeks ago, urgent care found UTI upon exam. Has had 2 rounds of antibiotics since then. Had sono of kidneys done @ dr shelton's office last week. Received call 11/15/13 from office stating she needed a CT for possible renal calculi on right. Has had increasing all w/e and now pain 10/10 with nausea, no vomiting.

## 2013-11-18 NOTE — ED Notes (Signed)
Pt c/o intermittent right side flank pain x3 weeks. Pt was been tx for UTI but pain continued. Pt was then told she had kidney stone after [place] on Friday. Pt is here due to pain and nausea. Denies vomiting.

## 2013-11-18 NOTE — Discharge Instructions (Signed)

## 2014-02-01 ENCOUNTER — Encounter (HOSPITAL_COMMUNITY): Payer: Self-pay | Admitting: Emergency Medicine

## 2014-02-01 ENCOUNTER — Emergency Department (HOSPITAL_COMMUNITY)
Admission: EM | Admit: 2014-02-01 | Discharge: 2014-02-01 | Disposition: A | Discharge status: Home / Self Care | Payer: 59 | Attending: Emergency Medicine | Admitting: Emergency Medicine

## 2014-02-01 ENCOUNTER — Emergency Department (HOSPITAL_COMMUNITY): Payer: 59

## 2014-02-01 DIAGNOSIS — Z853 Personal history of malignant neoplasm of breast: Secondary | ICD-10-CM | POA: Insufficient documentation

## 2014-02-01 DIAGNOSIS — K625 Hemorrhage of anus and rectum: Secondary | ICD-10-CM | POA: Insufficient documentation

## 2014-02-01 DIAGNOSIS — IMO0002 Reserved for concepts with insufficient information to code with codable children: Secondary | ICD-10-CM | POA: Insufficient documentation

## 2014-02-01 DIAGNOSIS — K5289 Other specified noninfective gastroenteritis and colitis: Secondary | ICD-10-CM | POA: Insufficient documentation

## 2014-02-01 DIAGNOSIS — Z8742 Personal history of other diseases of the female genital tract: Secondary | ICD-10-CM | POA: Insufficient documentation

## 2014-02-01 DIAGNOSIS — K529 Noninfective gastroenteritis and colitis, unspecified: Secondary | ICD-10-CM

## 2014-02-01 DIAGNOSIS — G43909 Migraine, unspecified, not intractable, without status migrainosus: Secondary | ICD-10-CM | POA: Insufficient documentation

## 2014-02-01 DIAGNOSIS — Z79899 Other long term (current) drug therapy: Secondary | ICD-10-CM | POA: Insufficient documentation

## 2014-02-01 DIAGNOSIS — Z8679 Personal history of other diseases of the circulatory system: Secondary | ICD-10-CM | POA: Insufficient documentation

## 2014-02-01 DIAGNOSIS — Z791 Long term (current) use of non-steroidal anti-inflammatories (NSAID): Secondary | ICD-10-CM | POA: Insufficient documentation

## 2014-02-01 DIAGNOSIS — M129 Arthropathy, unspecified: Secondary | ICD-10-CM | POA: Insufficient documentation

## 2014-02-01 DIAGNOSIS — G589 Mononeuropathy, unspecified: Secondary | ICD-10-CM | POA: Insufficient documentation

## 2014-02-01 LAB — COMPREHENSIVE METABOLIC PANEL
ALBUMIN: 3.6 g/dL (ref 3.5–5.2)
ALT: 10 U/L (ref 0–35)
AST: 13 U/L (ref 0–37)
Alkaline Phosphatase: 89 U/L (ref 39–117)
BILIRUBIN TOTAL: 0.2 mg/dL — AB (ref 0.3–1.2)
BUN: 8 mg/dL (ref 6–23)
CHLORIDE: 104 meq/L (ref 96–112)
CO2: 25 mEq/L (ref 19–32)
CREATININE: 0.9 mg/dL (ref 0.50–1.10)
Calcium: 9 mg/dL (ref 8.4–10.5)
GFR calc Af Amer: 88 mL/min — ABNORMAL LOW (ref >90)
GFR, EST NON AFRICAN AMERICAN: 76 mL/min — AB (ref >90)
Glucose, Bld: 102 mg/dL — ABNORMAL HIGH (ref 70–99)
Potassium: 4.1 mEq/L (ref 3.7–5.3)
Sodium: 140 mEq/L (ref 137–147)
Total Protein: 7.6 g/dL (ref 6.0–8.3)

## 2014-02-01 LAB — CBC WITH DIFFERENTIAL/PLATELET
Basophils Absolute: 0 10*3/uL (ref 0.0–0.1)
Basophils Relative: 1 % (ref 0–1)
EOS ABS: 0.3 10*3/uL (ref 0.0–0.7)
Eosinophils Relative: 8 % — ABNORMAL HIGH (ref 0–5)
HEMATOCRIT: 38.8 % (ref 36.0–46.0)
HEMOGLOBIN: 12.9 g/dL (ref 12.0–15.0)
Lymphocytes Relative: 34 % (ref 12–46)
Lymphs Abs: 1.4 10*3/uL (ref 0.7–4.0)
MCH: 29.5 pg (ref 26.0–34.0)
MCHC: 33.2 g/dL (ref 30.0–36.0)
MCV: 88.8 fL (ref 78.0–100.0)
MONO ABS: 0.6 10*3/uL (ref 0.1–1.0)
MONOS PCT: 15 % — AB (ref 3–12)
NEUTROS ABS: 1.8 10*3/uL (ref 1.7–7.7)
Neutrophils Relative %: 42 % — ABNORMAL LOW (ref 43–77)
Platelets: 206 10*3/uL (ref 150–400)
RBC: 4.37 MIL/uL (ref 3.87–5.11)
RDW: 13.2 % (ref 11.5–15.5)
WBC: 4.3 10*3/uL (ref 4.0–10.5)

## 2014-02-01 LAB — LIPASE, BLOOD: LIPASE: 31 U/L (ref 11–59)

## 2014-02-01 MED ORDER — CIPROFLOXACIN HCL 500 MG PO TABS: 500.0000 mg | ORAL_TABLET | Freq: Two times a day (BID) | ORAL | Status: DC

## 2014-02-01 MED ORDER — IOHEXOL 300 MG/ML  SOLN
100.0000 mL | Freq: Once | INTRAMUSCULAR | Status: AC | PRN
  Administered 2014-02-01: 100 mL via INTRAVENOUS

## 2014-02-01 MED ORDER — SODIUM CHLORIDE 0.9 % IV BOLUS (SEPSIS)
500.0000 mL | Freq: Once | INTRAVENOUS | Status: AC
Start: 2014-02-01 — End: 2014-02-01
  Administered 2014-02-01: 500 mL via INTRAVENOUS

## 2014-02-01 MED ORDER — HYDROCODONE-ACETAMINOPHEN 5-325 MG PO TABS
1.0000 | ORAL_TABLET | Freq: Four times a day (QID) | ORAL | Status: DC | PRN
Start: 2014-02-01 — End: 2014-04-23

## 2014-02-01 MED ORDER — PANTOPRAZOLE SODIUM 40 MG IV SOLR
40.0000 mg | Freq: Once | INTRAVENOUS | Status: AC
  Administered 2014-02-01: 40 mg via INTRAVENOUS
  Filled 2014-02-01: qty 40

## 2014-02-01 MED ORDER — IOHEXOL 300 MG/ML  SOLN
50.0000 mL | Freq: Once | INTRAMUSCULAR | Status: AC | PRN
  Administered 2014-02-01: 50 mL via ORAL

## 2014-02-01 MED ORDER — METRONIDAZOLE 500 MG PO TABS: 500.0000 mg | ORAL_TABLET | Freq: Four times a day (QID) | ORAL | Status: DC

## 2014-02-01 MED ORDER — PROMETHAZINE HCL 25 MG PO TABS: 25.0000 mg | ORAL_TABLET | Freq: Four times a day (QID) | ORAL | Status: DC | PRN

## 2014-02-01 MED ORDER — HYDROMORPHONE HCL PF 1 MG/ML IJ SOLN
1.0000 mg | Freq: Once | INTRAMUSCULAR | Status: AC
Start: 2014-02-01 — End: 2014-02-01
  Administered 2014-02-01: 1 mg via INTRAVENOUS
  Filled 2014-02-01: qty 1

## 2014-02-01 MED ORDER — ONDANSETRON HCL 4 MG/2ML IJ SOLN
4.0000 mg | Freq: Once | INTRAMUSCULAR | Status: AC
  Administered 2014-02-01: 4 mg via INTRAVENOUS
  Filled 2014-02-01: qty 2

## 2014-02-01 NOTE — Discharge Instructions (Signed)
Call your gi md Monday for follow up this week.

## 2014-02-01 NOTE — ED Provider Notes (Signed)
CSN: [id number]     Arrival date & time 02/01/14  1324 History   First MD Initiated Contact with Patient 02/01/14 1347     Chief Complaint  Patient presents with  . Abdominal Pain  . Rectal Bleeding     (Consider location/radiation/quality/duration/timing/severity/associated sxs/prior Treatment) Patient is a 48 y.o. female presenting with abdominal pain and hematochezia. The history is provided by the patient (the pt complains of lower abd pain and blood in stool).  Abdominal Pain Pain location:  LLQ Pain quality: aching   Pain radiates to:  Does not radiate Pain severity:  Moderate Onset quality:  Gradual Timing:  Constant Progression:  Unchanged Chronicity:  New Associated symptoms: hematochezia   Associated symptoms: no chest pain, no cough, no diarrhea, no fatigue and no hematuria   Rectal Bleeding Associated symptoms: abdominal pain     Past Medical History  Diagnosis Date  . Neuropathy   . Fibroid   . Endometriosis   . Mucinous cystadenoma of ovary     left  . BRCA1 negative   . BRCA2 negative   . History of chemotherapy 01/2005  . Radiation 04/2005  . Hx of tamoxifen therapy   . Migraines     just at dx of breast CA  . Arthritis   . Vitamin D deficiency   . Lymphedema of arm   . Breast cancer 2006    chem/radiation/2years tamoxifen   Past Surgical History  Procedure Laterality Date  . Tonsillectomy    . Myomectomy    . Abdominal hysterectomy  2007    TAH/BSO  . Dilation and curettage of uterus    . Breast surgery  2006    LUMPECTOMY   Family History  Problem Relation Age of Onset  . Breast cancer Mother 61  . Heart failure Father   . Heart disease Father   . Cancer Maternal Grandmother     COLON  . Heart failure Paternal Grandmother   . Heart disease Paternal Grandmother    History  Substance Use Topics  . Smoking status: Never Smoker   . Smokeless tobacco: Never Used  . Alcohol Use: No   OB History   Grav Para Term Preterm Abortions TAB  SAB Ect Mult Living   1 0   1  1   0     Review of Systems  Constitutional: Negative for appetite change and fatigue.  HENT: Negative for congestion, ear discharge and sinus pressure.   Eyes: Negative for discharge.  Respiratory: Negative for cough.   Cardiovascular: Negative for chest pain.  Gastrointestinal: Positive for abdominal pain and hematochezia. Negative for diarrhea.  Genitourinary: Negative for frequency and hematuria.  Musculoskeletal: Negative for back pain.  Skin: Negative for rash.  Neurological: Negative for seizures and headaches.  Psychiatric/Behavioral: Negative for hallucinations.      Allergies  Ampicillin; Compazine; Cymbalta; Imitrex; Lyrica; Metoclopramide hcl; and Effexor  Home Medications   Prior to Admission medications   Medication Sig Start Date End Date Taking? Authorizing Provider  albuterol (PROVENTIL HFA;VENTOLIN HFA) 108 (90 BASE) MCG/ACT inhaler Inhale 1-2 puffs into the lungs every 6 (six) hours as needed for wheezing. 05/08/13  Yes Janne Napoleon, NP  amitriptyline (ELAVIL) 10 MG tablet Take 10 mg by mouth at bedtime.   Yes Historical Provider, MD  beclomethasone (QVAR) 80 MCG/ACT inhaler Inhale 1 puff into the lungs daily as needed (Shortness of Breath).   Yes Historical Provider, MD  cetirizine (ZYRTEC) 10 MG tablet Take 10 mg by mouth  daily as needed for allergies.   Yes Historical Provider, MD  cholecalciferol (VITAMIN D) 1000 UNITS tablet Take 1,000 Units by mouth daily.   Yes Historical Provider, MD  cyanocobalamin (,VITAMIN B-12,) 1000 MCG/ML injection Inject 1,000 mcg into the muscle every 30 (thirty) days.   Yes Historical Provider, MD  cyclobenzaprine (FLEXERIL) 5 MG tablet Take 1 tablet (5 mg total) by mouth 3 (three) times daily as needed. For joint pain. 01/14/13  Yes Chauncey Cruel, MD  fluticasone (FLONASE) 50 MCG/ACT nasal spray Place 2 sprays into the nose daily. 07/18/12  Yes Harden Mo, MD  gabapentin (NEURONTIN) 300 MG  capsule Take 300 mg by mouth 2 (two) times daily.   Yes Historical Provider, MD  hyoscyamine (LEVSIN, ANASPAZ) 0.125 MG tablet Take 0.125 mg by mouth every 4 (four) hours as needed for cramping.   Yes Historical Provider, MD  megestrol (MEGACE) 40 MG tablet Take 0.5 tablets (20 mg total) by mouth at bedtime. 03/24/13  Yes Chauncey Cruel, MD  Multiple Vitamin (MULTIVITAMIN WITH MINERALS) TABS tablet Take 1 tablet by mouth daily.   Yes Historical Provider, MD  naproxen (NAPROSYN) 500 MG tablet Take 500 mg by mouth daily.   Yes Historical Provider, MD  pantoprazole (PROTONIX) 40 MG tablet Take 1 tablet (40 mg total) by mouth daily. 03/24/13  Yes Chauncey Cruel, MD  ciprofloxacin (CIPRO) 500 MG tablet Take 1 tablet (500 mg total) by mouth 2 (two) times daily. One po bid x 7 days 02/01/14   Maudry Diego, MD  HYDROcodone-acetaminophen (NORCO/VICODIN) 5-325 MG per tablet Take 1 tablet by mouth every 6 (six) hours as needed for moderate pain. 02/01/14   Maudry Diego, MD  metroNIDAZOLE (FLAGYL) 500 MG tablet Take 1 tablet (500 mg total) by mouth 4 (four) times daily. One po bid x 7 days 02/01/14   Maudry Diego, MD  promethazine (PHENERGAN) 25 MG tablet Take 1 tablet (25 mg total) by mouth every 6 (six) hours as needed for nausea or vomiting. 02/01/14   Maudry Diego, MD   BP 131/92  Pulse 93  Temp(Src) 98.4 F (36.9 C) (Oral)  Resp 18  Ht 5' 8.5" (1.74 m)  Wt 182 lb (82.555 kg)  BMI 27.27 kg/m2  SpO2 100% Physical Exam  Constitutional: She is oriented to person, place, and time. She appears well-developed.  HENT:  Head: Normocephalic.  Eyes: Conjunctivae and EOM are normal. No scleral icterus.  Neck: Neck supple. No thyromegaly present.  Cardiovascular: Normal rate and regular rhythm.  Exam reveals no gallop and no friction rub.   No murmur heard. Pulmonary/Chest: No stridor. She has no wheezes. She has no rales. She exhibits no tenderness.  Abdominal: She exhibits no distension. There  is tenderness. There is no rebound.  Mild left lower quadrant  Musculoskeletal: Normal range of motion. She exhibits no edema.  Lymphadenopathy:    She has no cervical adenopathy.  Neurological: She is oriented to person, place, and time. She exhibits normal muscle tone. Coordination normal.  Skin: No rash noted. No erythema.  Psychiatric: She has a normal mood and affect. Her behavior is normal.    ED Course  Procedures (including critical care time) Labs Review Labs Reviewed  CBC WITH DIFFERENTIAL - Abnormal; Notable for the following:    Neutrophils Relative % 42 (*)    Monocytes Relative 15 (*)    Eosinophils Relative 8 (*)    All other components within normal limits  COMPREHENSIVE METABOLIC  PANEL - Abnormal; Notable for the following:    Glucose, Bld 102 (*)    Total Bilirubin 0.2 (*)    GFR calc non Af Amer 76 (*)    GFR calc Af Amer 88 (*)    All other components within normal limits  LIPASE, BLOOD    Imaging Review Ct Abdomen Pelvis W Contrast  02/01/2014   CLINICAL DATA:  Rectal bleeding and abdominal pain and bloating for a few days, history of breast cancer with chemotherapy and radiation  EXAM: CT ABDOMEN AND PELVIS WITH CONTRAST  TECHNIQUE: Multidetector CT imaging of the abdomen and pelvis was performed using the standard protocol following bolus administration of intravenous contrast.  CONTRAST:  12m OMNIPAQUE IOHEXOL 300 MG/ML SOLN, 1048mOMNIPAQUE IOHEXOL 300 MG/ML SOLN  COMPARISON:  11/18/2013  FINDINGS: Mild patchy ground-glass attenuation in the dependent portions of the left lower lobe. This is most consistent with dependent atelectasis.  1 cm low-attenuation lesion anterior medial left lobe of liver, too small to characterize but stable from prior study and in turn from 10/13/11, likely benign. Liver is otherwise normal. Gallbladder is normal. Spleen is normal. Pancreas is normal.  Adrenal glands are normal. Kidneys are normal. Abdominal aorta is normal.  There is  a nonobstructive bowel gas pattern. The appendix is not seen well. There is moderate wall thickening of the rectum and distal half of the sigmoid colon. There is mild surrounding increased attenuation in the adjacent fat and there are numerous mildly prominent adjacent lymph nodes, the largest measuring 7 mm in short axis.  Bladder is normal. Reproductive organs are not identified. There are no acute musculoskeletal findings. There is moderate convex left scoliosis of the thoracolumbar spine.  IMPRESSION: Moderate wall thickening of the distal 1/3 to 1/2 of the sigmoid colon and of the rectum with mild inflammatory change in the surrounding abdominal fat. Infectious colitis or other inflammatory causes suspected. Ischemic change is not excluded but considered less likely given the extent of the abnormality. Malignancy is not excluded but considered unlikely.   Electronically Signed   By: RaSkipper Cliche.D.   On: 02/01/2014 15:58     EKG Interpretation None      MDM   Final diagnoses:  Colitis        JoMaudry DiegoMD 02/01/14 [id number] [phone]

## 2014-02-01 NOTE — ED Notes (Signed)
Started passing mucus in stools about 2 weeks ago and then last week transitioned over to blood. Scheduled for colonoscopy June 11 but states that she didn't get any sleep last night because she had to keep going to bathroom and passing blood and mucus. Pt states she is weak, nauseated, and has felt dizzy at times. Pt has family history of colon cancer.

## 2014-03-19 ENCOUNTER — Other Ambulatory Visit: Payer: Self-pay | Admitting: Gastroenterology

## 2014-03-19 DIAGNOSIS — R1011 Right upper quadrant pain: Secondary | ICD-10-CM

## 2014-03-24 ENCOUNTER — Ambulatory Visit (HOSPITAL_COMMUNITY)
Admission: RE | Admit: 2014-03-24 | Discharge: 2014-03-24 | Disposition: A | Discharge status: Home / Self Care | Payer: 59 | Source: Ambulatory Visit | Attending: Gastroenterology | Admitting: Gastroenterology

## 2014-03-24 DIAGNOSIS — R1011 Right upper quadrant pain: Secondary | ICD-10-CM | POA: Insufficient documentation

## 2014-03-24 DIAGNOSIS — Z853 Personal history of malignant neoplasm of breast: Secondary | ICD-10-CM | POA: Insufficient documentation

## 2014-03-25 ENCOUNTER — Other Ambulatory Visit: Payer: Self-pay | Admitting: Gastroenterology

## 2014-03-25 DIAGNOSIS — R1013 Epigastric pain: Secondary | ICD-10-CM

## 2014-03-26 ENCOUNTER — Other Ambulatory Visit: Payer: Self-pay | Admitting: Oncology

## 2014-04-02 ENCOUNTER — Encounter (HOSPITAL_COMMUNITY)
Admission: RE | Admit: 2014-04-02 | Discharge: 2014-04-02 | Disposition: A | Discharge status: Home / Self Care | Payer: 59 | Source: Ambulatory Visit | Attending: Gastroenterology | Admitting: Gastroenterology

## 2014-04-02 DIAGNOSIS — R1013 Epigastric pain: Secondary | ICD-10-CM | POA: Insufficient documentation

## 2014-04-02 MED ORDER — SINCALIDE 5 MCG IJ SOLR
0.0200 ug/kg | Freq: Once | INTRAMUSCULAR | Status: AC
  Administered 2014-04-02: 08:00:00 via INTRAVENOUS

## 2014-04-02 MED ORDER — TECHNETIUM TC 99M MEBROFENIN IV KIT
5.0000 | PACK | Freq: Once | INTRAVENOUS | Status: AC | PRN
  Administered 2014-04-02: 5 via INTRAVENOUS

## 2014-04-02 MED ORDER — SINCALIDE 5 MCG IJ SOLR
INTRAMUSCULAR | Status: AC
  Filled 2014-04-02: qty 5

## 2014-04-04 ENCOUNTER — Encounter (INDEPENDENT_AMBULATORY_CARE_PROVIDER_SITE_OTHER): Payer: Self-pay | Admitting: General Surgery

## 2014-04-18 ENCOUNTER — Ambulatory Visit (INDEPENDENT_AMBULATORY_CARE_PROVIDER_SITE_OTHER): Payer: Commercial Managed Care - PPO | Admitting: General Surgery

## 2014-04-18 ENCOUNTER — Encounter (INDEPENDENT_AMBULATORY_CARE_PROVIDER_SITE_OTHER): Payer: Self-pay | Admitting: General Surgery

## 2014-04-18 VITALS — BP 142/88 | HR 105 | Temp 98.0°F | Resp 18 | Ht 68.5 in | Wt 194.0 lb

## 2014-04-18 DIAGNOSIS — K828 Other specified diseases of gallbladder: Secondary | ICD-10-CM

## 2014-04-18 NOTE — Patient Instructions (Signed)
Our office and should call you within two buisiness days to scheduling your surgery. Please contact us with any questions or if you do not hear anything in an appropriate amount of time.

## 2014-04-18 NOTE — Progress Notes (Signed)
Subjective:   Abdominal pain  Patient ID: Gabrielle Hawkins, female   DOB: 22-Sep-1965, 48 y.o.   MRN: [id number]  HPI Patient is a very pleasant 48 year old female referred by Dr. Benson Norway for ongoing abdominal pain and possible biliary dyskinesia. The patient was diagnosed with ulcerative colitis in June of this year. She presented with gradually worsening mucoid and then bloody stools. She was not having abdominal pain at that time. She is currently on treatment for ulcer colitis with improvement in the symptoms. However about one month ago she began to develop upper Abdominal pain.  This has been gradually Worsening since onset. She describes aching pain which she feels underneath her right rib cage and some in the epigastrium radiating around the right rib cage to her back. This is associated with nausea and occasional vomiting. It is now a daily occurrence requiring pain medication. The pain seems to be caused by using in a somewhat better when she does not eat. She still has some diarrhea which has been ongoing since June. No fever chills or jaundice. She has had an extensive workup by Dr. Benson Norway. Upper endoscopy was normal. Ultrasound of the abdomen was normal. HIDA scan was obtained showing normal ejection fraction but pain was reproduced with CCK.  Past Medical History  Diagnosis Date  . Neuropathy   . Fibroid   . Endometriosis   . Mucinous cystadenoma of ovary     left  . BRCA1 negative   . BRCA2 negative   . History of chemotherapy 01/2005  . Radiation 04/2005  . Hx of tamoxifen therapy   . Migraines     just at dx of breast CA  . Arthritis   . Vitamin D deficiency   . Lymphedema of arm   . Breast cancer 2006    chem/radiation/2years tamoxifen   Past Surgical History  Procedure Laterality Date  . Tonsillectomy    . Myomectomy    . Abdominal hysterectomy  2007    TAH/BSO  . Dilation and curettage of uterus    . Breast surgery  2006    LUMPECTOMY   Current Outpatient  Prescriptions  Medication Sig Dispense Refill  . albuterol (PROVENTIL HFA;VENTOLIN HFA) 108 (90 BASE) MCG/ACT inhaler Inhale 1-2 puffs into the lungs every 6 (six) hours as needed for wheezing.      [name] amitriptyline (ELAVIL) 10 MG tablet Take 10 mg by mouth at bedtime.      . beclomethasone (QVAR) 80 MCG/ACT inhaler Inhale 1 puff into the lungs daily as needed (Shortness of Breath).      . cetirizine (ZYRTEC) 10 MG tablet Take 10 mg by mouth daily as needed for allergies.      . cholecalciferol (VITAMIN D) 1000 UNITS tablet Take 1,000 Units by mouth daily.      . ciprofloxacin (CIPRO) 500 MG tablet Take 1 tablet (500 mg total) by mouth 2 (two) times daily. One po bid x 7 days  28 tablet  0  . cyanocobalamin (,VITAMIN B-12,) 1000 MCG/ML injection Inject 1,000 mcg into the muscle every 30 (thirty) days.      . cyclobenzaprine (FLEXERIL) 5 MG tablet Take 1 tablet (5 mg total) by mouth 3 (three) times daily as needed. For joint pain.  90 tablet  3  . fluticasone (FLONASE) 50 MCG/ACT nasal spray Place 2 sprays into the nose daily.  16 g  0  . gabapentin (NEURONTIN) 300 MG capsule Take 300 mg by mouth 2 (two) times daily.      [name]  hyoscyamine (LEVSIN, ANASPAZ) 0.125 MG tablet Take 0.125 mg by mouth every 4 (four) hours as needed for cramping.      . megestrol (MEGACE) 40 MG tablet Take 0.5 tablets (20 mg total) by mouth at bedtime.  45 tablet  3  . metroNIDAZOLE (FLAGYL) 500 MG tablet Take 1 tablet (500 mg total) by mouth 4 (four) times daily. One po bid x 7 days  56 tablet  0  . Multiple Vitamin (MULTIVITAMIN WITH MINERALS) TABS tablet Take 1 tablet by mouth daily.      . naproxen (NAPROSYN) 500 MG tablet Take 500 mg by mouth daily.      . pantoprazole (PROTONIX) 40 MG tablet Take 1 tablet (40 mg total) by mouth daily.  90 tablet  0  . promethazine (PHENERGAN) 25 MG tablet Take 1 tablet (25 mg total) by mouth every 6 (six) hours as needed for nausea or vomiting.  15 tablet  0  . traMADol (ULTRAM) 50 MG  tablet Take by mouth every 6 (six) hours as needed.      [name] HYDROcodone-acetaminophen (NORCO/VICODIN) 5-325 MG per tablet Take 1 tablet by mouth every 6 (six) hours as needed for moderate pain.  20 tablet  0   No current facility-administered medications for this visit.   Allergies  Allergen Reactions  . Ampicillin Hives and Itching  . Compazine [Prochlorperazine Maleate] Other (See Comments)    "Stroke-like symptoms"  . Cymbalta [Duloxetine Hcl] Other (See Comments)    Fainting  . Imitrex [Sumatriptan Base] Hives and Nausea And Vomiting  . Lyrica [Pregabalin] Other (See Comments)    Fainting  . Metoclopramide Hcl Hives  . Effexor [Venlafaxine Hydrochloride] Hives and Palpitations   History  Substance Use Topics  . Smoking status: Never Smoker   . Smokeless tobacco: Never Used  . Alcohol Use: No     Review of Systems  Constitutional: Positive for fatigue.  Respiratory: Negative.   Cardiovascular: Negative.   Gastrointestinal: Positive for nausea, vomiting, abdominal pain and diarrhea. Negative for blood in stool.  Genitourinary: Negative.   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 HEENT: No palpable masses or thyromegaly. Sclera nonicteric. Pupils equal round and reactive. Oropharynx clear. Lymph nodes: No cervical, supraclavicular, or inguinal nodes palpable. Lungs: Breath sounds clear and equal without increased work of breathing Cardiovascular: Regular rate and rhythm without murmur. No JVD or edema. Peripheral pulses intact. Abdomen: There is localized epigastric and right upper quadrant tenderness. No masses or organomegaly. No hernias. Extremities: No edema or joint  swelling or deformity. No chronic venous stasis changes. Neurologic: Alert and fully oriented. Gait normal.    Assessment:     Persistent right upper quadrant abdominal pain that is clinically typical for biliary pain. She has had an extensive negative workup as above. Possibility of biliary dyskinesia or small gallstones not seen on ultrasound. We had a long discussion regarding risks and benefits of cholecystectomy today. She understands that this is not reliable in relieving her symptoms, approximately 50% chance in this situation. She feels that her symptoms currently are intolerable and that this is helping she would like  to try. I think with the typical nature of her symptoms and extensive workup and ongoing intolerable pain this would be reasonable.I discussed the procedure in detail.  The patient was given Neurosurgeon.  We discussed the risks and benefits of a laparoscopic cholecystectomy and possible cholangiogram including, but not limited to bleeding, infection, injury to surrounding structures such as the intestine or liver, bile leak, retained gallstones, need to convert to an open procedure, prolonged diarrhea, blood clots such as  DVT, common bile duct injury, anesthesia risks, and possible need for additional procedures.  The likelihood of improvement in symptoms and return to the patient's normal status is Fair. We discussed the typical post-operative recovery course.     Plan:     Laparoscopic cholecystectomy with intraoperative cholangiogram as an outpatient

## 2014-04-21 ENCOUNTER — Encounter (INDEPENDENT_AMBULATORY_CARE_PROVIDER_SITE_OTHER): Payer: Self-pay | Admitting: General Surgery

## 2014-04-22 ENCOUNTER — Encounter (INDEPENDENT_AMBULATORY_CARE_PROVIDER_SITE_OTHER): Payer: Self-pay | Admitting: General Surgery

## 2014-04-23 ENCOUNTER — Encounter (HOSPITAL_COMMUNITY): Payer: Self-pay | Admitting: Pharmacy Technician

## 2014-04-23 NOTE — Patient Instructions (Signed)
Gabrielle Hawkins  04/23/2014   Your procedure is scheduled on:  04/29/14    Report to [facility].  Follow the Signs to [place] at   0530     am  Call this number if you have problems the morning of surgery: [phone]   Remember:   Do not eat food or drink liquids after midnight.   Take these medicines the morning of surgery with A SIP OF WATER:    Do not wear jewelry, make-up or nail polish.  Do not wear lotions, powders, or perfumes.deodorant.  Do not shave 48 hours prior to surgery.   Do not bring valuables to the hospital.  Contacts, dentures or bridgework may not be worn into surgery.  Leave suitcase in the car. After surgery it may be brought to your room.  For patients admitted to the hospital, checkout time is 11:00 AM the day of  discharge.     :      Please read over the following fact sheets that you were given: [facility] - Preparing for Surgery Before surgery, you can play an important role.  Because skin is not sterile, your skin needs to be as free of germs as possible.  You can reduce the number of germs on your skin by washing with CHG (chlorahexidine gluconate) soap before surgery.  CHG is an antiseptic cleaner which kills germs and bonds with the skin to continue killing germs even after washing. Please DO NOT use if you have an allergy to CHG or antibacterial soaps.  If your skin becomes reddened/irritated stop using the CHG and inform your nurse when you arrive at Short Stay. Do not shave (including legs and underarms) for at least 48 hours prior to the first CHG shower.  You may shave your face/neck. Please follow these instructions carefully:  1.  Shower with CHG Soap the night before surgery and the  morning of Surgery.  2.  If you choose to wash your hair, wash your hair first as usual with your  normal  shampoo.  3.  After you shampoo, rinse your hair and body thoroughly to remove the  shampoo.                           4.  Use CHG as  you would any other liquid soap.  You can apply chg directly  to the skin and wash                       Gently with a scrungie or clean washcloth.  5.  Apply the CHG Soap to your body ONLY FROM THE NECK DOWN.   Do not use on face/ open                           Wound or open sores. Avoid contact with eyes, ears mouth and genitals (private parts).                       Wash face,  Genitals (private parts) with your normal soap.             6.  Wash thoroughly, paying special attention to the area where your surgery  will be performed.  7.  Thoroughly rinse your body with warm water from the neck down.  8.  DO NOT shower/wash with your normal soap  after using and rinsing off  the CHG Soap.                9.  Pat yourself dry with a clean towel.            10.  Wear clean pajamas.            11.  Place clean sheets on your bed the night of your first shower and do not  sleep with pets. Day of Surgery : Do not apply any lotions/deodorants the morning of surgery.  Please wear clean clothes to the hospital/surgery center.  FAILURE TO FOLLOW THESE INSTRUCTIONS MAY RESULT IN THE CANCELLATION OF YOUR SURGERY PATIENT SIGNATURE_________________________________  NURSE SIGNATURE__________________________________  ________________________________________________________________________  coughing and deep breathing exercises, leg exercises

## 2014-04-24 ENCOUNTER — Encounter (HOSPITAL_COMMUNITY)
Admission: RE | Admit: 2014-04-24 | Discharge: 2014-04-24 | Disposition: A | Discharge status: Home / Self Care | Payer: 59 | Source: Ambulatory Visit | Attending: General Surgery | Admitting: General Surgery

## 2014-04-24 ENCOUNTER — Encounter (HOSPITAL_COMMUNITY): Payer: Self-pay

## 2014-04-24 DIAGNOSIS — Z01818 Encounter for other preprocedural examination: Secondary | ICD-10-CM | POA: Insufficient documentation

## 2014-04-24 DIAGNOSIS — K828 Other specified diseases of gallbladder: Secondary | ICD-10-CM | POA: Diagnosis not present

## 2014-04-24 HISTORY — DX: Major depressive disorder, single episode, unspecified: F32.9

## 2014-04-24 HISTORY — DX: Personal history of other medical treatment: Z92.89

## 2014-04-24 HISTORY — DX: Polyneuropathy, unspecified: G62.9

## 2014-04-24 HISTORY — DX: Anxiety disorder, unspecified: F41.9

## 2014-04-24 HISTORY — DX: Unspecified asthma, uncomplicated: J45.909

## 2014-04-24 HISTORY — DX: Gastro-esophageal reflux disease without esophagitis: K21.9

## 2014-04-24 HISTORY — DX: Depression, unspecified: F32.A

## 2014-04-24 HISTORY — DX: Dizziness and giddiness: R42

## 2014-04-24 HISTORY — DX: Cerebral infarction, unspecified: I63.9

## 2014-04-24 HISTORY — DX: Irritable bowel syndrome, unspecified: K58.9

## 2014-04-24 HISTORY — DX: Anemia, unspecified: D64.9

## 2014-04-24 HISTORY — DX: Urinary tract infection, site not specified: N39.0

## 2014-04-24 HISTORY — DX: Calculus of kidney: N20.0

## 2014-04-24 LAB — BASIC METABOLIC PANEL
Anion gap: 13 (ref 5–15)
BUN: 11 mg/dL (ref 6–23)
CALCIUM: 9.9 mg/dL (ref 8.4–10.5)
CO2: 24 mEq/L (ref 19–32)
Chloride: 105 mEq/L (ref 96–112)
Creatinine, Ser: 0.94 mg/dL (ref 0.50–1.10)
GFR calc Af Amer: 83 mL/min — ABNORMAL LOW (ref >90)
GFR calc non Af Amer: 72 mL/min — ABNORMAL LOW (ref >90)
GLUCOSE: 108 mg/dL — AB (ref 70–99)
POTASSIUM: 4.3 meq/L (ref 3.7–5.3)
Sodium: 142 mEq/L (ref 137–147)

## 2014-04-24 LAB — CBC
HCT: 42.6 % (ref 36.0–46.0)
Hemoglobin: 14 g/dL (ref 12.0–15.0)
MCH: 29.6 pg (ref 26.0–34.0)
MCHC: 32.9 g/dL (ref 30.0–36.0)
MCV: 90.1 fL (ref 78.0–100.0)
Platelets: 223 10*3/uL (ref 150–400)
RBC: 4.73 MIL/uL (ref 3.87–5.11)
RDW: 13.9 % (ref 11.5–15.5)
WBC: 5 10*3/uL (ref 4.0–10.5)

## 2014-04-24 NOTE — Progress Notes (Signed)
Clearance note on chart from Dr judge- 01/08/14 on chart.

## 2014-04-28 NOTE — Anesthesia Preprocedure Evaluation (Addendum)
Anesthesia Evaluation  Patient identified by MRN, date of birth, ID band Patient awake    Reviewed: Allergy & Precautions, H&P , NPO status , Patient's Chart, lab work & pertinent test results  Airway Mallampati: II TM Distance: >3 FB Neck ROM: full    Dental no notable dental hx. (+) Teeth Intact, Dental Advisory Given   Pulmonary asthma ,  breath sounds clear to auscultation  Pulmonary exam normal       Cardiovascular Exercise Tolerance: Good negative cardio ROS  Rhythm:regular Rate:Normal     Neuro/Psych  Headaches, Anxiety Depression Vertigo 2014. Peripheral neuropathy. Compazine dystonic reaction negative psych ROS   GI/Hepatic negative GI ROS, Neg liver ROS, GERD-  Medicated and Controlled,  Endo/Other  negative endocrine ROS  Renal/GU negative Renal ROS  negative genitourinary   Musculoskeletal   Abdominal   Peds  Hematology negative hematology ROS (+)   Anesthesia Other Findings Breast cancer. Lymphedema of arm  Reproductive/Obstetrics negative OB ROS                          Anesthesia Physical Anesthesia Plan  ASA: III  Anesthesia Plan: General   Post-op Pain Management:    Induction: Intravenous  Airway Management Planned: Oral ETT  Additional Equipment:   Intra-op Plan:   Post-operative Plan: Extubation in OR  Informed Consent: I have reviewed the patients History and Physical, chart, labs and discussed the procedure including the risks, benefits and alternatives for the proposed anesthesia with the patient or authorized representative who has indicated his/her understanding and acceptance.   Dental Advisory Given  Plan Discussed with: CRNA and Surgeon  Anesthesia Plan Comments:         Anesthesia Quick Evaluation

## 2014-04-29 ENCOUNTER — Ambulatory Visit (HOSPITAL_COMMUNITY)
Admission: RE | Admit: 2014-04-29 | Discharge: 2014-04-29 | Disposition: A | Discharge status: Home / Self Care | Payer: 59 | Source: Ambulatory Visit | Attending: General Surgery | Admitting: General Surgery

## 2014-04-29 ENCOUNTER — Emergency Department (HOSPITAL_COMMUNITY): Payer: 59

## 2014-04-29 ENCOUNTER — Encounter (HOSPITAL_COMMUNITY): Payer: Self-pay | Admitting: Emergency Medicine

## 2014-04-29 ENCOUNTER — Inpatient Hospital Stay (HOSPITAL_COMMUNITY)
Admission: EM | Admit: 2014-04-29 | Discharge: 2014-05-01 | DRG: 358 | Disposition: A | Discharge status: Home / Self Care | Payer: 59 | Attending: General Surgery | Admitting: General Surgery

## 2014-04-29 ENCOUNTER — Encounter (HOSPITAL_COMMUNITY)
Admission: RE | Disposition: A | Discharge status: Home / Self Care | Payer: Self-pay | Source: Ambulatory Visit | Attending: General Surgery

## 2014-04-29 ENCOUNTER — Ambulatory Visit (HOSPITAL_COMMUNITY): Payer: 59 | Admitting: Anesthesiology

## 2014-04-29 ENCOUNTER — Telehealth (INDEPENDENT_AMBULATORY_CARE_PROVIDER_SITE_OTHER): Payer: Self-pay | Admitting: Surgery

## 2014-04-29 ENCOUNTER — Ambulatory Visit (HOSPITAL_COMMUNITY): Payer: 59

## 2014-04-29 ENCOUNTER — Encounter (HOSPITAL_COMMUNITY): Payer: Self-pay | Admitting: *Deleted

## 2014-04-29 ENCOUNTER — Encounter (HOSPITAL_COMMUNITY): Payer: 59 | Admitting: Anesthesiology

## 2014-04-29 DIAGNOSIS — Z79899 Other long term (current) drug therapy: Secondary | ICD-10-CM | POA: Diagnosis not present

## 2014-04-29 DIAGNOSIS — R112 Nausea with vomiting, unspecified: Secondary | ICD-10-CM | POA: Diagnosis present

## 2014-04-29 DIAGNOSIS — R1115 Cyclical vomiting syndrome unrelated to migraine: Secondary | ICD-10-CM | POA: Diagnosis present

## 2014-04-29 DIAGNOSIS — K828 Other specified diseases of gallbladder: Secondary | ICD-10-CM

## 2014-04-29 DIAGNOSIS — K824 Cholesterolosis of gallbladder: Secondary | ICD-10-CM

## 2014-04-29 DIAGNOSIS — K811 Chronic cholecystitis: Secondary | ICD-10-CM

## 2014-04-29 DIAGNOSIS — Z6829 Body mass index (BMI) 29.0-29.9, adult: Secondary | ICD-10-CM | POA: Diagnosis not present

## 2014-04-29 DIAGNOSIS — Z9889 Other specified postprocedural states: Secondary | ICD-10-CM

## 2014-04-29 DIAGNOSIS — Z853 Personal history of malignant neoplasm of breast: Secondary | ICD-10-CM | POA: Diagnosis not present

## 2014-04-29 DIAGNOSIS — Z9089 Acquired absence of other organs: Secondary | ICD-10-CM | POA: Diagnosis not present

## 2014-04-29 HISTORY — PX: CHOLECYSTECTOMY: SHX55

## 2014-04-29 LAB — COMPREHENSIVE METABOLIC PANEL
ALBUMIN: 3.8 g/dL (ref 3.5–5.2)
ALK PHOS: 74 U/L (ref 39–117)
ALT: 26 U/L (ref 0–35)
AST: 32 U/L (ref 0–37)
Anion gap: 16 — ABNORMAL HIGH (ref 5–15)
BUN: 9 mg/dL (ref 6–23)
CALCIUM: 9.7 mg/dL (ref 8.4–10.5)
CO2: 22 mEq/L (ref 19–32)
Chloride: 101 mEq/L (ref 96–112)
Creatinine, Ser: 0.85 mg/dL (ref 0.50–1.10)
GFR calc Af Amer: >90 mL/min (ref >90)
GFR calc non Af Amer: 81 mL/min — ABNORMAL LOW (ref >90)
Glucose, Bld: 118 mg/dL — ABNORMAL HIGH (ref 70–99)
POTASSIUM: 4.1 meq/L (ref 3.7–5.3)
SODIUM: 139 meq/L (ref 137–147)
Total Bilirubin: 0.5 mg/dL (ref 0.3–1.2)
Total Protein: 7.5 g/dL (ref 6.0–8.3)

## 2014-04-29 LAB — CBC WITH DIFFERENTIAL/PLATELET
Basophils Absolute: 0 10*3/uL (ref 0.0–0.1)
Basophils Relative: 0 % (ref 0–1)
EOS ABS: 0 10*3/uL (ref 0.0–0.7)
Eosinophils Relative: 0 % (ref 0–5)
HCT: 40.7 % (ref 36.0–46.0)
HEMOGLOBIN: 13.7 g/dL (ref 12.0–15.0)
LYMPHS ABS: 0.5 10*3/uL — AB (ref 0.7–4.0)
Lymphocytes Relative: 10 % — ABNORMAL LOW (ref 12–46)
MCH: 30 pg (ref 26.0–34.0)
MCHC: 33.7 g/dL (ref 30.0–36.0)
MCV: 89.1 fL (ref 78.0–100.0)
MONOS PCT: 6 % (ref 3–12)
Monocytes Absolute: 0.3 10*3/uL (ref 0.1–1.0)
NEUTROS ABS: 4.6 10*3/uL (ref 1.7–7.7)
Neutrophils Relative %: 84 % — ABNORMAL HIGH (ref 43–77)
PLATELETS: 206 10*3/uL (ref 150–400)
RBC: 4.57 MIL/uL (ref 3.87–5.11)
RDW: 13.5 % (ref 11.5–15.5)
WBC: 5.4 10*3/uL (ref 4.0–10.5)

## 2014-04-29 LAB — LIPASE, BLOOD: Lipase: 22 U/L (ref 11–59)

## 2014-04-29 LAB — LACTIC ACID, PLASMA: LACTIC ACID, VENOUS: 1.7 mmol/L (ref 0.5–2.2)

## 2014-04-29 SURGERY — Surgical Case
Anesthesia: *Unknown

## 2014-04-29 SURGERY — LAPAROSCOPIC CHOLECYSTECTOMY WITH INTRAOPERATIVE CHOLANGIOGRAM
Anesthesia: General | Site: Abdomen

## 2014-04-29 MED ORDER — DEXAMETHASONE SODIUM PHOSPHATE 10 MG/ML IJ SOLN
INTRAMUSCULAR | Status: AC
  Filled 2014-04-29: qty 1

## 2014-04-29 MED ORDER — EPHEDRINE SULFATE 50 MG/ML IJ SOLN
INTRAMUSCULAR | Status: DC | PRN
  Administered 2014-04-29 (×2): 5 mg via INTRAVENOUS

## 2014-04-29 MED ORDER — BUPIVACAINE-EPINEPHRINE 0.25% -1:200000 IJ SOLN
INTRAMUSCULAR | Status: DC | PRN
  Administered 2014-04-29: 40 mL

## 2014-04-29 MED ORDER — SODIUM CHLORIDE 0.45 % IV SOLN
INTRAVENOUS | Status: DC
  Administered 2014-04-30: 125 mL/h via INTRAVENOUS

## 2014-04-29 MED ORDER — PROMETHAZINE HCL 25 MG/ML IJ SOLN
6.2500 mg | INTRAMUSCULAR | Status: AC | PRN
  Administered 2014-04-29 (×2): 6.25 mg via INTRAVENOUS
  Filled 2014-04-29: qty 1

## 2014-04-29 MED ORDER — MORPHINE SULFATE 4 MG/ML IJ SOLN
4.0000 mg | Freq: Once | INTRAMUSCULAR | Status: AC
  Administered 2014-04-29: 4 mg via INTRAVENOUS
  Filled 2014-04-29: qty 1

## 2014-04-29 MED ORDER — IOHEXOL 300 MG/ML  SOLN
INTRAMUSCULAR | Status: DC | PRN
  Administered 2014-04-29: 50 mL via INTRAVENOUS

## 2014-04-29 MED ORDER — NEOSTIGMINE METHYLSULFATE 10 MG/10ML IV SOLN
INTRAVENOUS | Status: DC | PRN
  Administered 2014-04-29: 4 mg via INTRAVENOUS

## 2014-04-29 MED ORDER — CHLORHEXIDINE GLUCONATE 4 % EX LIQD: 1.0000 "application " | Freq: Once | CUTANEOUS | Status: DC

## 2014-04-29 MED ORDER — LACTATED RINGERS IV SOLN
INTRAVENOUS | Status: DC | PRN
  Administered 2014-04-29 (×2): via INTRAVENOUS

## 2014-04-29 MED ORDER — ONDANSETRON HCL 4 MG/2ML IJ SOLN
INTRAMUSCULAR | Status: AC
Start: 2014-04-29 — End: 2014-04-29
  Filled 2014-04-29: qty 2

## 2014-04-29 MED ORDER — CIPROFLOXACIN IN D5W 400 MG/200ML IV SOLN
400.0000 mg | INTRAVENOUS | Status: AC
  Administered 2014-04-29: 400 mg via INTRAVENOUS

## 2014-04-29 MED ORDER — ONDANSETRON HCL 4 MG/2ML IJ SOLN
4.0000 mg | Freq: Once | INTRAMUSCULAR | Status: AC
  Administered 2014-04-29: 4 mg via INTRAVENOUS
  Filled 2014-04-29: qty 2

## 2014-04-29 MED ORDER — METOPROLOL TARTRATE 1 MG/ML IV SOLN
INTRAVENOUS | Status: AC
  Filled 2014-04-29: qty 5

## 2014-04-29 MED ORDER — ROCURONIUM BROMIDE 100 MG/10ML IV SOLN
INTRAVENOUS | Status: DC | PRN
  Administered 2014-04-29: 40 mg via INTRAVENOUS

## 2014-04-29 MED ORDER — MORPHINE SULFATE 4 MG/ML IJ SOLN: 4.0000 mg | INTRAMUSCULAR | Status: DC | PRN

## 2014-04-29 MED ORDER — PHENYLEPHRINE HCL 10 MG/ML IJ SOLN
INTRAMUSCULAR | Status: DC | PRN
  Administered 2014-04-29 (×2): 80 ug via INTRAVENOUS

## 2014-04-29 MED ORDER — GLYCOPYRROLATE 0.2 MG/ML IJ SOLN
INTRAMUSCULAR | Status: AC
  Filled 2014-04-29: qty 3

## 2014-04-29 MED ORDER — KETOROLAC TROMETHAMINE 30 MG/ML IJ SOLN
INTRAMUSCULAR | Status: AC
  Filled 2014-04-29: qty 1

## 2014-04-29 MED ORDER — FENTANYL CITRATE 0.05 MG/ML IJ SOLN
INTRAMUSCULAR | Status: AC
  Filled 2014-04-29: qty 2

## 2014-04-29 MED ORDER — CIPROFLOXACIN IN D5W 400 MG/200ML IV SOLN
INTRAVENOUS | Status: AC
  Filled 2014-04-29: qty 200

## 2014-04-29 MED ORDER — ONDANSETRON HCL 4 MG/2ML IJ SOLN
INTRAMUSCULAR | Status: DC | PRN
  Administered 2014-04-29: 4 mg via INTRAVENOUS

## 2014-04-29 MED ORDER — FENTANYL CITRATE 0.05 MG/ML IJ SOLN
INTRAMUSCULAR | Status: AC
  Filled 2014-04-29: qty 5

## 2014-04-29 MED ORDER — METOPROLOL TARTRATE 1 MG/ML IV SOLN
INTRAVENOUS | Status: DC | PRN
  Administered 2014-04-29: 1 mg via INTRAVENOUS

## 2014-04-29 MED ORDER — NEOSTIGMINE METHYLSULFATE 10 MG/10ML IV SOLN
INTRAVENOUS | Status: AC
  Filled 2014-04-29: qty 1

## 2014-04-29 MED ORDER — POTASSIUM CHLORIDE 10 MEQ/100ML IV SOLN
10.0000 meq | Freq: Once | INTRAVENOUS | Status: AC
  Administered 2014-04-30: 10 meq via INTRAVENOUS
  Filled 2014-04-29: qty 100

## 2014-04-29 MED ORDER — ROCURONIUM BROMIDE 100 MG/10ML IV SOLN
INTRAVENOUS | Status: AC
  Filled 2014-04-29: qty 1

## 2014-04-29 MED ORDER — PHENYLEPHRINE 40 MCG/ML (10ML) SYRINGE FOR IV PUSH (FOR BLOOD PRESSURE SUPPORT)
PREFILLED_SYRINGE | INTRAVENOUS | Status: AC
  Filled 2014-04-29: qty 10

## 2014-04-29 MED ORDER — MIDAZOLAM HCL 5 MG/5ML IJ SOLN
INTRAMUSCULAR | Status: DC | PRN
  Administered 2014-04-29: 2 mg via INTRAVENOUS

## 2014-04-29 MED ORDER — LACTATED RINGERS IV SOLN: INTRAVENOUS | Status: DC

## 2014-04-29 MED ORDER — LACTATED RINGERS IV SOLN
INTRAVENOUS | Status: DC | PRN
  Administered 2014-04-29: 1000 mL via INTRAVENOUS

## 2014-04-29 MED ORDER — MORPHINE SULFATE 15 MG PO TABS
15.0000 mg | ORAL_TABLET | ORAL | Status: DC | PRN
Start: 2014-04-29 — End: 2015-01-07

## 2014-04-29 MED ORDER — MORPHINE SULFATE 15 MG PO TABS
15.0000 mg | ORAL_TABLET | ORAL | Status: DC | PRN
  Administered 2014-04-29: 15 mg via ORAL
  Filled 2014-04-29: qty 1

## 2014-04-29 MED ORDER — 0.9 % SODIUM CHLORIDE (POUR BTL) OPTIME
TOPICAL | Status: DC | PRN
  Administered 2014-04-29: 1000 mL

## 2014-04-29 MED ORDER — BUPIVACAINE-EPINEPHRINE 0.25% -1:200000 IJ SOLN
INTRAMUSCULAR | Status: AC
  Filled 2014-04-29: qty 1

## 2014-04-29 MED ORDER — DEXAMETHASONE SODIUM PHOSPHATE 4 MG/ML IJ SOLN
INTRAMUSCULAR | Status: DC | PRN
  Administered 2014-04-29: 10 mg via INTRAVENOUS

## 2014-04-29 MED ORDER — ONDANSETRON HCL 4 MG/2ML IJ SOLN
INTRAMUSCULAR | Status: AC
  Filled 2014-04-29: qty 2

## 2014-04-29 MED ORDER — FENTANYL CITRATE 0.05 MG/ML IJ SOLN
50.0000 ug | INTRAMUSCULAR | Status: DC | PRN
  Administered 2014-04-29 (×3): 50 ug via INTRAVENOUS

## 2014-04-29 MED ORDER — MIDAZOLAM HCL 2 MG/2ML IJ SOLN
INTRAMUSCULAR | Status: AC
  Filled 2014-04-29: qty 2

## 2014-04-29 MED ORDER — SODIUM CHLORIDE 0.9 % IV BOLUS (SEPSIS)
1000.0000 mL | Freq: Once | INTRAVENOUS | Status: AC
  Administered 2014-04-29: 1000 mL via INTRAVENOUS

## 2014-04-29 MED ORDER — GLYCOPYRROLATE 0.2 MG/ML IJ SOLN
INTRAMUSCULAR | Status: DC | PRN
  Administered 2014-04-29: .8 mg via INTRAVENOUS

## 2014-04-29 MED ORDER — PROMETHAZINE HCL 25 MG/ML IJ SOLN
25.0000 mg | Freq: Four times a day (QID) | INTRAMUSCULAR | Status: DC | PRN
  Administered 2014-04-29 – 2014-04-30 (×2): 25 mg via INTRAVENOUS
  Filled 2014-04-29 (×2): qty 1

## 2014-04-29 MED ORDER — KETOROLAC TROMETHAMINE 30 MG/ML IJ SOLN
30.0000 mg | Freq: Once | INTRAMUSCULAR | Status: AC
  Administered 2014-04-29: 30 mg via INTRAVENOUS

## 2014-04-29 MED ORDER — FENTANYL CITRATE 0.05 MG/ML IJ SOLN
INTRAMUSCULAR | Status: DC | PRN
  Administered 2014-04-29 (×2): 50 ug via INTRAVENOUS
  Administered 2014-04-29: 100 ug via INTRAVENOUS
  Administered 2014-04-29: 50 ug via INTRAVENOUS

## 2014-04-29 MED ORDER — HYDROMORPHONE HCL PF 1 MG/ML IJ SOLN: 0.2500 mg | INTRAMUSCULAR | Status: DC | PRN

## 2014-04-29 MED ORDER — PROPOFOL 10 MG/ML IV BOLUS
INTRAVENOUS | Status: AC
  Filled 2014-04-29: qty 20

## 2014-04-29 MED ORDER — PROPOFOL 10 MG/ML IV BOLUS
INTRAVENOUS | Status: DC | PRN
  Administered 2014-04-29: 150 mg via INTRAVENOUS

## 2014-04-29 SURGICAL SUPPLY — 39 items
APPLIER CLIP 5 13 M/L LIGAMAX5 (MISCELLANEOUS) ×2
APPLIER CLIP ROT 10 11.4 M/L (STAPLE) ×2
CANISTER SUCTION 2500CC (MISCELLANEOUS) IMPLANT
CATH REDDICK CHOLANGI 4FR 50CM (CATHETERS) IMPLANT
CHLORAPREP W/TINT 26ML (MISCELLANEOUS) ×2 IMPLANT
CLIP APPLIE 5 13 M/L LIGAMAX5 (MISCELLANEOUS) ×1 IMPLANT
CLIP APPLIE ROT 10 11.4 M/L (STAPLE) ×1 IMPLANT
COVER MAYO STAND STRL (DRAPES) ×2 IMPLANT
DECANTER SPIKE VIAL GLASS SM (MISCELLANEOUS) ×2 IMPLANT
DERMABOND ADVANCED (GAUZE/BANDAGES/DRESSINGS) ×1
DERMABOND ADVANCED .7 DNX12 (GAUZE/BANDAGES/DRESSINGS) ×1 IMPLANT
DRAPE C-ARM 42X120 X-RAY (DRAPES) ×2 IMPLANT
DRAPE LAPAROSCOPIC ABDOMINAL (DRAPES) ×2 IMPLANT
DRAPE UTILITY XL STRL (DRAPES) IMPLANT
ELECT REM PT RETURN 9FT ADLT (ELECTROSURGICAL) ×2
ELECTRODE REM PT RTRN 9FT ADLT (ELECTROSURGICAL) ×1 IMPLANT
GLOVE BIOGEL PI IND STRL 7.5 (GLOVE) ×1 IMPLANT
GLOVE BIOGEL PI INDICATOR 7.5 (GLOVE) ×1
GLOVE SS BIOGEL STRL SZ 7.5 (GLOVE) ×1 IMPLANT
GLOVE SUPERSENSE BIOGEL SZ 7.5 (GLOVE) ×1
GOWN STRL REUS W/TWL XL LVL3 (GOWN DISPOSABLE) ×8 IMPLANT
HEMOSTAT SNOW SURGICEL 2X4 (HEMOSTASIS) IMPLANT
KIT BASIN OR (CUSTOM PROCEDURE TRAY) ×2 IMPLANT
MANIFOLD NEPTUNE II (INSTRUMENTS) ×2 IMPLANT
NS IRRIG 1000ML POUR BTL (IV SOLUTION) IMPLANT
POUCH SPECIMEN RETRIEVAL 10MM (ENDOMECHANICALS) ×2 IMPLANT
SCISSORS LAP 5X35 DISP (ENDOMECHANICALS) ×2 IMPLANT
SET CHOLANGIOGRAPH MIX (MISCELLANEOUS) ×2 IMPLANT
SET IRRIG TUBING LAPAROSCOPIC (IRRIGATION / IRRIGATOR) ×2 IMPLANT
SLEEVE XCEL OPT CAN 5 100 (ENDOMECHANICALS) ×4 IMPLANT
SOLUTION ANTI FOG 6CC (MISCELLANEOUS) ×2 IMPLANT
SUT MNCRL AB 4-0 PS2 18 (SUTURE) ×2 IMPLANT
TOWEL OR 17X26 10 PK STRL BLUE (TOWEL DISPOSABLE) ×2 IMPLANT
TOWEL OR NON WOVEN STRL DISP B (DISPOSABLE) ×2 IMPLANT
TRAY LAP CHOLE (CUSTOM PROCEDURE TRAY) ×2 IMPLANT
TROCAR BLADELESS OPT 5 100 (ENDOMECHANICALS) ×2 IMPLANT
TROCAR XCEL BLUNT TIP 100MML (ENDOMECHANICALS) ×2 IMPLANT
TROCAR XCEL NON-BLD 11X100MML (ENDOMECHANICALS) ×2 IMPLANT
TUBING INSUFFLATION 10FT LAP (TUBING) ×2 IMPLANT

## 2014-04-29 NOTE — Progress Notes (Signed)
Patient requests something for nausea at home. Called Dr Excell Seltzer. Zofran 4 mg PO Q6 hour prn nausea (#15) called into [facility] out patient pharmacy.

## 2014-04-29 NOTE — Progress Notes (Signed)
PACU note----pt continues to c/o pain level 7; crying; states "I have low tolerance for pain but I want to go home today"  Dr. Landry Dyke called and order rec'd and pain given

## 2014-04-29 NOTE — Transfer of Care (Signed)
Immediate Anesthesia Transfer of Care Note  [name]  Procedure(s) Performed: Procedure(s): LAPAROSCOPIC CHOLECYSTECTOMY WITH INTRAOPERATIVE CHOLANGIOGRAM (N/A)  Patient Location: PACU  Anesthesia Type:General  Level of Consciousness: Patient easily awoken, sedated, comfortable, cooperative, following commands, responds to stimulation.   Airway & Oxygen Therapy: Patient spontaneously breathing, ventilating well, oxygen via simple oxygen mask.  Post-op Assessment: Report given to PACU RN, vital signs reviewed and stable, moving all extremities.   Post vital signs: Reviewed and stable.  Complications: No apparent anesthesia complications

## 2014-04-29 NOTE — ED Notes (Signed)
Pt A+Ox4, reports had cholecystecomy this AM, was sent home this afternoon and has been vomiting and having inc abd pain since arriving at home.  7/10.  Pt actively vomiting during triage, sts unable to keep home nausea/pain medications down and unable to tolerate sips of water or gingerale.  Abd distended, tender at umbilicus and R upper incision site.  Skin PWD. Speaking full/clear sentences.  Changed to hospital gown.  Family at bedside.

## 2014-04-29 NOTE — H&P (Signed)
HPI  Patient is a very pleasant 48 year old female referred by Dr. Benson Norway for ongoing abdominal pain and possible biliary dyskinesia. The patient was diagnosed with ulcerative colitis in June of this year. She presented with gradually worsening mucoid and then bloody stools. She was not having abdominal pain at that time. She is currently on treatment for ulcer colitis with improvement in the symptoms. However about one month ago she began to develop upper Abdominal pain. This has been gradually Worsening since onset. She describes aching pain which she feels underneath her right rib cage and some in the epigastrium radiating around the right rib cage to her back. This is associated with nausea and occasional vomiting. It is now a daily occurrence requiring pain medication. The pain seems to be caused by using in a somewhat better when she does not eat. She still has some diarrhea which has been ongoing since June. No fever chills or jaundice. She has had an extensive workup by Dr. Benson Norway. Upper endoscopy was normal. Ultrasound of the abdomen was normal. HIDA scan was obtained showing normal ejection fraction but pain was reproduced with CCK.  Past Medical History   Diagnosis  Date   .  Neuropathy    .  Fibroid    .  Endometriosis    .  Mucinous cystadenoma of ovary      left   .  BRCA1 negative    .  BRCA2 negative    .  History of chemotherapy  01/2005   .  Radiation  04/2005   .  Hx of tamoxifen therapy    .  Migraines      just at dx of breast CA   .  Arthritis    .  Vitamin D deficiency    .  Lymphedema of arm    .  Breast cancer  2006     chem/radiation/2years tamoxifen    Past Surgical History   Procedure  Laterality  Date   .  Tonsillectomy     .  Myomectomy     .  Abdominal hysterectomy   2007     TAH/BSO   .  Dilation and curettage of uterus     .  Breast surgery   2006     LUMPECTOMY    Current Outpatient Prescriptions   Medication  Sig  Dispense  Refill   .  albuterol  (PROVENTIL HFA;VENTOLIN HFA) 108 (90 BASE) MCG/ACT inhaler  Inhale 1-2 puffs into the lungs every 6 (six) hours as needed for wheezing.     [name]  amitriptyline (ELAVIL) 10 MG tablet  Take 10 mg by mouth at bedtime.     .  beclomethasone (QVAR) 80 MCG/ACT inhaler  Inhale 1 puff into the lungs daily as needed (Shortness of Breath).     .  cetirizine (ZYRTEC) 10 MG tablet  Take 10 mg by mouth daily as needed for allergies.     .  cholecalciferol (VITAMIN D) 1000 UNITS tablet  Take 1,000 Units by mouth daily.     .  ciprofloxacin (CIPRO) 500 MG tablet  Take 1 tablet (500 mg total) by mouth 2 (two) times daily. One po bid x 7 days  28 tablet  0   .  cyanocobalamin (,VITAMIN B-12,) 1000 MCG/ML injection  Inject 1,000 mcg into the muscle every 30 (thirty) days.     .  cyclobenzaprine (FLEXERIL) 5 MG tablet  Take 1 tablet (5 mg total) by mouth 3 (three) times daily as  needed. For joint pain.  90 tablet  3   .  fluticasone (FLONASE) 50 MCG/ACT nasal spray  Place 2 sprays into the nose daily.  16 g  0   .  gabapentin (NEURONTIN) 300 MG capsule  Take 300 mg by mouth 2 (two) times daily.     .  hyoscyamine (LEVSIN, ANASPAZ) 0.125 MG tablet  Take 0.125 mg by mouth every 4 (four) hours as needed for cramping.     .  megestrol (MEGACE) 40 MG tablet  Take 0.5 tablets (20 mg total) by mouth at bedtime.  45 tablet  3   .  metroNIDAZOLE (FLAGYL) 500 MG tablet  Take 1 tablet (500 mg total) by mouth 4 (four) times daily. One po bid x 7 days  56 tablet  0   .  Multiple Vitamin (MULTIVITAMIN WITH MINERALS) TABS tablet  Take 1 tablet by mouth daily.     .  naproxen (NAPROSYN) 500 MG tablet  Take 500 mg by mouth daily.     .  pantoprazole (PROTONIX) 40 MG tablet  Take 1 tablet (40 mg total) by mouth daily.  90 tablet  0   .  promethazine (PHENERGAN) 25 MG tablet  Take 1 tablet (25 mg total) by mouth every 6 (six) hours as needed for nausea or vomiting.  15 tablet  0   .  traMADol (ULTRAM) 50 MG tablet  Take by mouth every 6  (six) hours as needed.     [name]  HYDROcodone-acetaminophen (NORCO/VICODIN) 5-325 MG per tablet  Take 1 tablet by mouth every 6 (six) hours as needed for moderate pain.  20 tablet  0    No current facility-administered medications for this visit.    Allergies   Allergen  Reactions   .  Ampicillin  Hives and Itching   .  Compazine [Prochlorperazine Maleate]  Other (See Comments)     "Stroke-like symptoms"   .  Cymbalta [Duloxetine Hcl]  Other (See Comments)     Fainting   .  Imitrex [Sumatriptan Base]  Hives and Nausea And Vomiting   .  Lyrica [Pregabalin]  Other (See Comments)     Fainting   .  Metoclopramide Hcl  Hives   .  Effexor [Venlafaxine Hydrochloride]  Hives and Palpitations    History   Substance Use Topics   .  Smoking status:  Never Smoker   .  Smokeless tobacco:  Never Used   .  Alcohol Use:  No   Review of Systems  Constitutional: Positive for fatigue.  Respiratory: Negative.  Cardiovascular: Negative.  Gastrointestinal: Positive for nausea, vomiting, abdominal pain and diarrhea. Negative for blood in stool.  Genitourinary: Negative.  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  HEENT: No palpable masses or thyromegaly. Sclera nonicteric. Pupils equal round and reactive. Oropharynx clear.  Lymph nodes: No cervical, supraclavicular, or inguinal nodes palpable.  Lungs: Breath sounds clear and equal without increased work of breathing  Cardiovascular: Regular rate and rhythm without murmur. No JVD or edema. Peripheral pulses intact.  Abdomen: There is localized epigastric and right upper quadrant tenderness. No masses or organomegaly. No hernias.  Extremities:  No edema or joint  swelling or deformity. No chronic venous stasis changes.  Neurologic: Alert and fully oriented. Gait normal.  Assessment:   Persistent right upper quadrant abdominal pain that is clinically typical for biliary pain. She has had an extensive negative workup as above. Possibility of biliary dyskinesia or small gallstones not seen on ultrasound. We had a long discussion regarding risks and benefits of cholecystectomy today. She understands that this is not reliable in relieving her symptoms, approximately 50% chance in this situation. She feels that her symptoms currently are intolerable and that this is helping she would like to try. I think with the typical nature of her symptoms and extensive workup and ongoing intolerable pain this would be reasonable.I discussed the procedure in detail. The patient was given Neurosurgeon. We discussed the risks and benefits of a laparoscopic cholecystectomy and possible cholangiogram including, but not limited to bleeding, infection, injury to surrounding structures such as the intestine or liver, bile leak, retained gallstones, need to convert to an open procedure, prolonged diarrhea, blood clots such as DVT, common bile duct injury, anesthesia risks, and possible need for additional procedures. The likelihood of improvement in symptoms and return to the patient's normal status is Fair. We discussed the typical post-operative recovery course.  Plan:   Laparoscopic cholecystectomy with intraoperative cholangiogram as an outpatient    Edward Jolly MD, FACS  04/29/2014, 7:22 AM

## 2014-04-29 NOTE — ED Provider Notes (Signed)
CSN: [id number]     Arrival date & time 04/29/14  2137 History   First MD Initiated Contact with Patient 04/29/14 2147     Chief Complaint  Patient presents with  . Abdominal Pain  . Emesis     (Consider location/radiation/quality/duration/timing/severity/associated sxs/prior Treatment) Patient is a 48 y.o. female presenting with abdominal pain. The history is limited by the condition of the patient.  Abdominal Pain Pain location:  Generalized Pain quality: aching and cramping   Pain radiates to:  Does not radiate Pain severity:  Severe Onset quality:  Gradual Duration:  1 day Timing:  Constant Progression:  Unchanged Chronicity:  New Context: previous surgery (cholecystecomy today)   Context: not recent illness   Relieved by:  Nothing Worsened by:  Palpation and eating Ineffective treatments:  None tried Associated symptoms: anorexia, constipation (no BM today), flatus, nausea and vomiting   Associated symptoms: no chest pain, no chills, no cough, no diarrhea, no dysuria, no fever, no shortness of breath, no sore throat, no vaginal bleeding and no vaginal discharge     Past Medical History  Diagnosis Date  . Neuropathy   . Fibroid   . Endometriosis   . Mucinous cystadenoma of ovary     left  . BRCA1 negative   . BRCA2 negative   . History of chemotherapy 01/2005  . Radiation 04/2005  . Hx of tamoxifen therapy   . Migraines     just at dx of breast CA  . Vitamin D deficiency   . Lymphedema of arm   . Breast cancer 2006    chem/radiation/2years tamoxifen  . Stroke     related to when taken compazine  . Asthma   . Anxiety   . Depression   . Urinary tract infection     x 3  . Kidney stones   . GERD (gastroesophageal reflux disease)   . Peripheral neuropathy   . Arthritis     big toe   . Anemia 1998   . Hx of transfusion of packed red blood cells   . Vertigo 2014  . IBS (irritable bowel syndrome)    Past Surgical History  Procedure Laterality Date  .  Tonsillectomy    . Myomectomy    . Abdominal hysterectomy  2007    TAH/BSO  . Dilation and curettage of uterus    . Breast surgery  2006    LUMPECTOMY  . Cholecystectomy    . Cholecystectomy N/A 04/29/2014    Procedure: LAPAROSCOPIC CHOLECYSTECTOMY WITH INTRAOPERATIVE CHOLANGIOGRAM;  Surgeon: Edward Jolly, MD;  Location: [facility];  Service: General;  Laterality: N/A;   Family History  Problem Relation Age of Onset  . Breast cancer Mother 55  . Heart failure Father   . Heart disease Father   . Cancer Maternal Grandmother     COLON  . Heart failure Paternal Grandmother   . Heart disease Paternal Grandmother    History  Substance Use Topics  . Smoking status: Never Smoker   . Smokeless tobacco: Never Used  . Alcohol Use: No   OB History   Grav Para Term Preterm Abortions TAB SAB Ect Mult Living   1 0   1  1   0     Review of Systems  Constitutional: Negative for fever and chills.  HENT: Negative for congestion, rhinorrhea and sore throat.   Eyes: Negative for photophobia and visual disturbance.  Respiratory: Negative for cough and shortness of breath.   Cardiovascular: Negative for  chest pain and leg swelling.  Gastrointestinal: Positive for nausea, vomiting, abdominal pain, constipation (no BM today), anorexia and flatus. Negative for diarrhea.  Endocrine: Negative for polyphagia and polyuria.  Genitourinary: Negative for dysuria, flank pain, vaginal bleeding, vaginal discharge and enuresis.  Musculoskeletal: Negative for back pain and gait problem.  Skin: Negative for color change and rash.  Neurological: Negative for dizziness, syncope, light-headedness and numbness.  Hematological: Negative for adenopathy. Does not bruise/bleed easily.  All other systems reviewed and are negative.     Allergies  Ampicillin; Compazine; Cymbalta; Imitrex; Lyrica; Metoclopramide hcl; Percocet; and Effexor  Home Medications   Prior to Admission medications   Medication Sig  Start Date End Date Taking? Authorizing Provider  albuterol (PROVENTIL HFA;VENTOLIN HFA) 108 (90 BASE) MCG/ACT inhaler Inhale 1-2 puffs into the lungs every 6 (six) hours as needed for wheezing. 05/08/13  Yes Janne Napoleon, NP  amitriptyline (ELAVIL) 10 MG tablet Take 10 mg by mouth at bedtime.   Yes Historical Provider, MD  azaTHIOprine (IMURAN) 50 MG tablet Take 100 mg by mouth daily.   Yes Historical Provider, MD  beclomethasone (QVAR) 80 MCG/ACT inhaler Inhale 1 puff into the lungs daily as needed (Shortness of Breath).   Yes Historical Provider, MD  cyanocobalamin (,VITAMIN B-12,) 1000 MCG/ML injection Inject 1,000 mcg into the muscle every 30 (thirty) days.   Yes Historical Provider, MD  cyclobenzaprine (FLEXERIL) 10 MG tablet Take 10 mg by mouth 2 (two) times daily as needed for muscle spasms.   Yes Historical Provider, MD  diphenhydrAMINE (BENADRYL) 25 MG tablet Take 25 mg by mouth at bedtime as needed for sleep.   Yes Historical Provider, MD  gabapentin (NEURONTIN) 300 MG capsule Take 300 mg by mouth daily.    Yes Historical Provider, MD  hyoscyamine (LEVSIN, ANASPAZ) 0.125 MG tablet Take 0.125 mg by mouth every 4 (four) hours as needed for cramping.   Yes Historical Provider, MD  megestrol (MEGACE) 40 MG tablet Take 0.5 tablets (20 mg total) by mouth at bedtime. 03/24/13  Yes Chauncey Cruel, MD  Melatonin 5 MG CAPS Take by mouth. As needed for sleep   Yes Historical Provider, MD  meloxicam (MOBIC) 7.5 MG tablet Take 7.5 mg by mouth daily.   Yes Historical Provider, MD  mesalamine (LIALDA) 1.2 G EC tablet Take 1.2 g by mouth 2 (two) times daily.   Yes Historical Provider, MD  morphine (MSIR) 15 MG tablet Take 1 tablet (15 mg total) by mouth every 4 (four) hours as needed for severe pain. 04/29/14  Yes Edward Jolly, MD  pantoprazole (PROTONIX) 40 MG tablet Take 1 tablet (40 mg total) by mouth daily. 03/24/13  Yes Chauncey Cruel, MD  promethazine (PHENERGAN) 25 MG tablet Take 25 mg by  mouth every 6 (six) hours as needed for nausea or vomiting.   Yes Historical Provider, MD  traMADol (ULTRAM) 50 MG tablet Take 50 mg by mouth 3 (three) times daily.    Yes Historical Provider, MD   BP 140/79  Pulse 87  Temp(Src) 99.1 F (37.3 C) (Oral)  Resp 18  Ht 5' 8" (1.727 m)  Wt 197 lb (89.359 kg)  BMI 29.96 kg/m2  SpO2 99% Physical Exam  Vitals reviewed. Constitutional: She is oriented to person, place, and time. She appears well-developed and well-nourished.  HENT:  Head: Normocephalic and atraumatic.  Right Ear: External ear normal.  Left Ear: External ear normal.  Eyes: Conjunctivae and EOM are normal. Pupils are equal, round, and  reactive to light.  Neck: Normal range of motion. Neck supple.  Cardiovascular: Normal rate, regular rhythm, normal heart sounds and intact distal pulses.   Pulmonary/Chest: Effort normal and breath sounds normal.  Abdominal: Soft. Bowel sounds are normal. There is tenderness in the right upper quadrant and epigastric area.  Bruising around inferior aspect of inferior umbilical surgical scar, no active bleeding  Musculoskeletal: Normal range of motion.  Neurological: She is alert and oriented to person, place, and time.  Skin: Skin is warm and dry.    ED Course  Procedures (including critical care time) Labs Review Labs Reviewed  COMPREHENSIVE METABOLIC PANEL - Abnormal; Notable for the following:    Glucose, Bld 118 (*)    GFR calc non Af Amer 81 (*)    Anion gap 16 (*)    All other components within normal limits  CBC WITH DIFFERENTIAL - Abnormal; Notable for the following:    Neutrophils Relative % 84 (*)    Lymphocytes Relative 10 (*)    Lymphs Abs 0.5 (*)    All other components within normal limits  COMPREHENSIVE METABOLIC PANEL - Abnormal; Notable for the following:    Glucose, Bld 112 (*)    Albumin 3.3 (*)    GFR calc non Af Amer 82 (*)    All other components within normal limits  LIPASE, BLOOD  LACTIC ACID, PLASMA  CBC  WITH DIFFERENTIAL    Imaging Review Dg Abd Acute W/chest  04/29/2014   CLINICAL DATA:  Abdominal pain. Emesis. Cholecystectomy this morning. History of breast cancer.  EXAM: ACUTE ABDOMEN SERIES (ABDOMEN 2 VIEW & CHEST 1 VIEW)  COMPARISON:  CT abdomen and pelvis 02/01/2014.  Chest 05/08/2013.  FINDINGS: There is no evidence of dilated bowel loops or free intraperitoneal air. No radiopaque calculi or other significant radiographic abnormality is seen. Heart size and mediastinal contours are within normal limits. Both lungs are clear. Surgical clips in the right upper quadrant. Residual contrast material in the right colon. Mild lumbar scoliosis convex towards the left. Degenerative changes in the hips.  IMPRESSION: Negative abdominal radiographs.  No acute cardiopulmonary disease.   Electronically Signed   By: Lucienne Capers M.D.   On: 04/29/2014 22:54     EKG Interpretation None      MDM   Final diagnoses:  Postoperative nausea and vomiting    48 y.o. female  with pertinent PMH of cholecystectomy today presents with continued nausea, vomiting, abdominal pain. Patient has not had a bowel movement or passed flatus since her surgery. On arrival today the patient's vital signs and physical exam are as above without generalized peritonitis. Her vitals are without fever, she initially was mildly tachycardic to improve fluids. X-ray of abdomen was without acute ST. Labs otherwise as above. Consulted surgery.    Labs and imaging as above reviewed. Admitted in stable condition.   1. Postoperative nausea and vomiting         Debby Freiberg, MD 05/01/14 [phone]

## 2014-04-29 NOTE — Op Note (Signed)
Preoperative diagnosis: Biliary dyskinesia  Postoperative diagnosis: Same  Surgical procedure: Laparoscopic cholecystectomy with intraoperative cholangiogram  Surgeon: [name] T. Yesennia Hirota M.D.  Assistant: Michael Boston  Anesthesia: General Endotracheal  Complications: None  Estimated blood loss: Minimal  Description of procedure: The patient brought to the operating room, placed in the supine position on the operating table, and general endotracheal anesthesia induced. The abdomen was widely sterilely prepped and draped. The patient had received preoperative IV antibiotics and PAS were in place. Patient timeout was performed the correct procedure verified. Standard 4 port technique was used with an open Hassan cannula at the umbilicus and the remainder of the ports placed under direct vision. The gallbladder was visualized. It appeared somewhat distended but not significantly inflamed. The fundus was grasped and elevated up over the liver and the infundibulum retracted inferiolaterally. Peritoneum anterior and posterior to Calot's triangle was incised and fibrofatty tissue stripped off the neck of the gallbladder toward the porta hepatis. The distal gallbladder was thoroughly dissected. The cystic artery was identified in Calot's triangle and the cystic duct gallbladder junction dissected 360.  A good critical view was obtained. When the anatomy was clear the cystic duct was clipped at the gallbladder junction and an operative cholangiogram obtained through the cystic duct. This showed good filling of a normal common bile duct and intrahepatic ducts with free flow into the duodenum and no filling defects. Following this the Cholangiocath was removed and the cystic duct was doubly clipped proximally and divided. The cystic artery was doubly clipped proximally and distally and divided. The gallbladder was dissected free from its bed using hook cautery and removed through the umbilical port site.  Complete hemostasis was obtained in the gallbladder bed. The right upper quadrant was thoroughly irrigated and hemostasis assured. Trochars were removed and all CO2 evacuated and the [facility] trocar site fascial defect closed. Skin incisions were closed with subcuticular Monocryl and Dermabond. Sponge needle and instrument counts were correct. The patient was taken to PACU in good condition.  Dameka Younker T  04/29/2014

## 2014-04-29 NOTE — Telephone Encounter (Signed)
I spoke with the mother, Gabrielle Hawkins. Gabrielle Hawkins had a lap chole today by Dr. Excell Seltzer and went home today. She has had nausea and vomiting since getting home.  She has nausea meds, but cannot keep it down.  I offered to call in a suppository, but the mother did have a nearby pharmacy she thought was open.  Gabrielle Hawkins going to try to get through the night.  If she is still sick tomorrow AM, she will call the office.  Alphonsa Overall, MD, [facility] Surgery Pager: [phone] Office phone:  [phone]

## 2014-04-29 NOTE — Anesthesia Postprocedure Evaluation (Signed)
  Anesthesia Post-op Note  [name]  Procedure(s) Performed: Procedure(s) (LRB): LAPAROSCOPIC CHOLECYSTECTOMY WITH INTRAOPERATIVE CHOLANGIOGRAM (N/A)  Patient Location: PACU  Anesthesia Type: General  Level of Consciousness: awake and alert   Airway and Oxygen Therapy: Patient Spontanous Breathing  Post-op Pain: mild  Post-op Assessment: Post-op Vital signs reviewed, Patient's Cardiovascular Status Stable, Respiratory Function Stable, Patent Airway and No signs of Nausea or vomiting  Last Vitals:  Filed Vitals:   04/29/14 1000  BP: 116/71  Pulse: 71  Temp:   Resp: 13    Post-op Vital Signs: stable   Complications: No apparent anesthesia complications

## 2014-04-29 NOTE — Discharge Instructions (Signed)
[facility] ______[facility] LAPAROSCOPIC SURGERY: POST OP INSTRUCTIONS Always review your discharge instruction sheet given to you by the facility where your surgery was performed. IF YOU HAVE DISABILITY OR FAMILY LEAVE FORMS, YOU MUST BRING THEM TO THE OFFICE FOR PROCESSING.   DO NOT GIVE THEM TO YOUR DOCTOR.  1. A prescription for pain medication may be given to you upon discharge.  Take your pain medication as prescribed, if needed.  If narcotic pain medicine is not needed, then you may take acetaminophen (Tylenol) or ibuprofen (Advil) as needed. 2. Take your usually prescribed medications unless otherwise directed. 3. If you need a refill on your pain medication, please contact your pharmacy.  They will contact our office to request authorization. Prescriptions will not be filled after 5pm or on week-ends. 4. You should follow a light diet the first few days after arrival home, such as soup and crackers, etc.  Be sure to include lots of fluids daily. 5. Most patients will experience some swelling and bruising in the area of the incisions.  Ice packs will help.  Swelling and bruising can take several days to resolve.  6. It is common to experience some constipation if taking pain medication after surgery.  Increasing fluid intake and taking a stool softener (such as Colace) will usually help or prevent this problem from occurring.  A mild laxative (Milk of Magnesia or Miralax) should be taken according to package instructions if there are no bowel movements after 48 hours. 7. Unless discharge instructions indicate otherwise, you may remove your bandages 24-48 hours after surgery, and you may shower at that time.  You may have steri-strips (small skin tapes) in place directly over the incision.  These strips should be left on the skin for 7-10 days.  If your surgeon used skin glue on the incision, you may shower in 24 hours.  The glue will flake off over the next 2-3 weeks.  Any sutures or  staples will be removed at the office during your follow-up visit. 8. ACTIVITIES:  You may resume regular (light) daily activities beginning the next day--such as daily self-care, walking, climbing stairs--gradually increasing activities as tolerated.  You may have sexual intercourse when it is comfortable.  Refrain from any heavy lifting or straining until approved by your doctor. a. You may drive when you are no longer taking prescription pain medication, you can comfortably wear a seatbelt, and you can safely maneuver your car and apply brakes. b. RETURN TO WORK:  __________________________________________________________ 9. You should see your doctor in the office for a follow-up appointment approximately 2-3 weeks after your surgery.  Make sure that you call for this appointment within a day or two after you arrive home to insure a convenient appointment time. 10. OTHER INSTRUCTIONS: __________________________________________________________________________________________________________________________ __________________________________________________________________________________________________________________________ WHEN TO CALL YOUR DOCTOR: 1. Fever over 101.0 2. Inability to urinate 3. Continued bleeding from incision. 4. Increased pain, redness, or drainage from the incision. 5. Increasing abdominal pain  The clinic staff is available to answer your questions during regular business hours.  Please dont hesitate to call and ask to speak to one of the nurses for clinical concerns.  If you have a medical emergency, go to the nearest emergency room or call 911.  A surgeon from [facility] is always on call at the hospital. [address] [place], [place], [place]  [address] ? [address], [place], [place]   [address] [phone] ? [phone] ? FAX [phone] Web site:  [email or web address]     General Anesthesia, Care After Refer to this sheet  in the next few weeks. These instructions provide you with information on caring for yourself after your procedure. Your health care provider may also give you more specific instructions. Your treatment has been planned according to current medical practices, but problems sometimes occur. Call your health care provider if you have any problems or questions after your procedure. WHAT TO EXPECT AFTER THE PROCEDURE After the procedure, it is typical to experience:  Sleepiness.  Nausea and vomiting. HOME CARE INSTRUCTIONS  For the first 24 hours after general anesthesia:  Have a responsible person with you.  Do not drive a car. If you are alone, do not take public transportation.  Do not drink alcohol.  Do not take medicine that has not been prescribed by your health care provider.  Do not sign important papers or make important decisions.  You may resume a normal diet and activities as directed by your health care provider.  Change bandages (dressings) as directed.  If you have questions or problems that seem related to general anesthesia, call the hospital and ask for the anesthetist or anesthesiologist on call. SEEK MEDICAL CARE IF:  You have nausea and vomiting that continue the day after anesthesia.  You develop a rash. SEEK IMMEDIATE MEDICAL CARE IF:   You have difficulty breathing.  You have chest pain.  You have any allergic problems. Document Released: 11/28/2000 Document Revised: 08/27/2013 Document Reviewed: 03/07/2013 [facility] Patient Information 2015 [place], [place]. This information is not intended to replace advice given to you by your health care provider. Make sure you discuss any questions you have with your health care provider.

## 2014-04-30 ENCOUNTER — Encounter (HOSPITAL_COMMUNITY): Payer: Self-pay | Admitting: General Surgery

## 2014-04-30 LAB — COMPREHENSIVE METABOLIC PANEL
ALT: 21 U/L (ref 0–35)
AST: 24 U/L (ref 0–37)
Albumin: 3.3 g/dL — ABNORMAL LOW (ref 3.5–5.2)
Alkaline Phosphatase: 65 U/L (ref 39–117)
Anion gap: 13 (ref 5–15)
BUN: 9 mg/dL (ref 6–23)
CHLORIDE: 104 meq/L (ref 96–112)
CO2: 23 mEq/L (ref 19–32)
CREATININE: 0.84 mg/dL (ref 0.50–1.10)
Calcium: 8.9 mg/dL (ref 8.4–10.5)
GFR, EST NON AFRICAN AMERICAN: 82 mL/min — AB (ref >90)
GLUCOSE: 112 mg/dL — AB (ref 70–99)
Potassium: 4 mEq/L (ref 3.7–5.3)
Sodium: 140 mEq/L (ref 137–147)
Total Bilirubin: 0.5 mg/dL (ref 0.3–1.2)
Total Protein: 6.6 g/dL (ref 6.0–8.3)

## 2014-04-30 LAB — CBC WITH DIFFERENTIAL/PLATELET
BASOS ABS: 0 10*3/uL (ref 0.0–0.1)
Basophils Relative: 0 % (ref 0–1)
Eosinophils Absolute: 0 10*3/uL (ref 0.0–0.7)
Eosinophils Relative: 0 % (ref 0–5)
HCT: 38.7 % (ref 36.0–46.0)
HEMOGLOBIN: 12.8 g/dL (ref 12.0–15.0)
LYMPHS PCT: 15 % (ref 12–46)
Lymphs Abs: 1.1 10*3/uL (ref 0.7–4.0)
MCH: 30 pg (ref 26.0–34.0)
MCHC: 33.1 g/dL (ref 30.0–36.0)
MCV: 90.6 fL (ref 78.0–100.0)
Monocytes Absolute: 0.7 10*3/uL (ref 0.1–1.0)
Monocytes Relative: 11 % (ref 3–12)
NEUTROS ABS: 5.2 10*3/uL (ref 1.7–7.7)
Neutrophils Relative %: 74 % (ref 43–77)
Platelets: 189 10*3/uL (ref 150–400)
RBC: 4.27 MIL/uL (ref 3.87–5.11)
RDW: 13.7 % (ref 11.5–15.5)
WBC: 7.1 10*3/uL (ref 4.0–10.5)

## 2014-04-30 MED ORDER — MORPHINE SULFATE 15 MG PO TABS: 15.0000 mg | ORAL_TABLET | ORAL | Status: DC | PRN

## 2014-04-30 MED ORDER — HEPARIN SODIUM (PORCINE) 5000 UNIT/ML IJ SOLN
5000.0000 [IU] | Freq: Three times a day (TID) | INTRAMUSCULAR | Status: DC
  Administered 2014-04-30 – 2014-05-01 (×4): 5000 [IU] via SUBCUTANEOUS
  Filled 2014-04-30 (×7): qty 1

## 2014-04-30 NOTE — Care Management Note (Signed)
    Page 1 of 1   04/30/2014     2:27:21 PM CARE MANAGEMENT NOTE 04/30/2014  Patient:  Gabrielle Hawkins, Gabrielle Hawkins   Account Number:  [id number]  Date Initiated:  04/30/2014  Documentation initiated by:  Sunday Spillers  Subjective/Objective Assessment:   48 yo female admitted with n/v s/p lap chole prior 24h. PTA lived at home with parent.     Action/Plan:   Home when stable   Anticipated DC Date:  05/01/2014   Anticipated DC Plan:  [place]  CM consult      Choice offered to / List presented to:             Status of service:  Completed, signed off Medicare Important Message given?   (If response is "NO", the following Medicare IM given date fields will be blank) Date Medicare IM given:   Medicare IM given by:   Date Additional Medicare IM given:   Additional Medicare IM given by:    Discharge Disposition:  HOME/SELF CARE  Per UR Regulation:  Reviewed for med. necessity/level of care/duration of stay  If discussed at [place] of Stay Meetings, dates discussed:    Comments:

## 2014-04-30 NOTE — Progress Notes (Signed)
Patient ID: Gabrielle Hawkins, female   DOB: 1966/09/04, 48 y.o.   MRN: [id number]    Subjective: Patient presented back to the emergency department early this morning with persistent nausea and vomiting following laparoscopic cholecystectomy yesterday She is feeling better this morning. Has been sipping on clear liquids with no vomiting. No severe pain, just sore around the umbilical incision.  Objective: Vital signs in last 24 hours: Temp:  [97.6 F (36.4 C)-99 F (37.2 C)] 99 F (37.2 C) (08/26 0620) Pulse Rate:  [66-107] 107 (08/26 0620) Resp:  [13-22] 18 (08/26 0620) BP: (112-152)/(68-89) 127/81 mmHg (08/26 0620) SpO2:  [92 %-100 %] 96 % (08/26 0620) Weight:  [197 lb (89.359 kg)] 197 lb (89.359 kg) (08/26 0031) Last BM Date: 04/28/14  Intake/Output from previous day: 08/25 0701 - 08/26 0700 In: 480 [P.O.:480] Out: 1100 [Urine:1100] Intake/Output this shift:    General appearance: alert, cooperative and no distress GI: mild appropriate mid abdominal tenderness mostly around the umbilical incision  Lab Results:   Recent Labs  04/29/14 2203 04/30/14 0503  WBC 5.4 7.1  HGB 13.7 12.8  HCT 40.7 38.7  PLT 206 189   BMET  Recent Labs  04/29/14 2203 04/30/14 0503  NA 139 140  K 4.1 4.0  CL 101 104  CO2 22 23  GLUCOSE 118* 112*  BUN 9 9  CREATININE 0.85 0.84  CALCIUM 9.7 8.9     Studies/Results: Dg Cholangiogram Operative  04/29/2014   CLINICAL DATA:  biliary dyskinesia  EXAM: INTRAOPERATIVE CHOLANGIOGRAM  TECHNIQUE: Cholangiographic images from the C-arm fluoroscopic device were submitted for interpretation post-operatively. Please see the procedural report for the amount of contrast and the fluoroscopy time utilized.  COMPARISON:  Ultrasound 03/24/2014  FINDINGS: No persistent filling defects in the common duct. Intrahepatic ducts are incompletely visualized, appearing decompressed centrally. Contrast passes into the duodenum.  : Negative for retained common  duct stone.   Electronically Signed   By: Arne Cleveland M.D.   On: 04/29/2014 08:43   Dg Abd Acute W/chest  04/29/2014   CLINICAL DATA:  Abdominal pain. Emesis. Cholecystectomy this morning. History of breast cancer.  EXAM: ACUTE ABDOMEN SERIES (ABDOMEN 2 VIEW & CHEST 1 VIEW)  COMPARISON:  CT abdomen and pelvis 02/01/2014.  Chest 05/08/2013.  FINDINGS: There is no evidence of dilated bowel loops or free intraperitoneal air. No radiopaque calculi or other significant radiographic abnormality is seen. Heart size and mediastinal contours are within normal limits. Both lungs are clear. Surgical clips in the right upper quadrant. Residual contrast material in the right colon. Mild lumbar scoliosis convex towards the left. Degenerative changes in the hips.  IMPRESSION: Negative abdominal radiographs.  No acute cardiopulmonary disease.   Electronically Signed   By: Lucienne Capers M.D.   On: 04/29/2014 22:54    Anti-infectives: Anti-infectives   None      Assessment/Plan: Postop nausea and vomiting. Probably multifactorial. I do not see evidence of any significant complication. She is improved this morning. Observe this morning. She could possibly go home later today if tolerating liquids and ambulatory.    LOS: 1 day    Rachana Malesky T 04/30/2014

## 2014-04-30 NOTE — H&P (Signed)
Hx Pt S/P lap chole yesterday.  Persistent N/V at home and presented to ED and admitted  Past Medical History  Diagnosis Date  . Neuropathy   . Fibroid   . Endometriosis   . Mucinous cystadenoma of ovary     left  . BRCA1 negative   . BRCA2 negative   . History of chemotherapy 01/2005  . Radiation 04/2005  . Hx of tamoxifen therapy   . Migraines     just at dx of breast CA  . Vitamin D deficiency   . Lymphedema of arm   . Breast cancer 2006    chem/radiation/2years tamoxifen  . Stroke     related to when taken compazine  . Asthma   . Anxiety   . Depression   . Urinary tract infection     x 3  . Kidney stones   . GERD (gastroesophageal reflux disease)   . Peripheral neuropathy   . Arthritis     big toe   . Anemia 1998   . Hx of transfusion of packed red blood cells   . Vertigo 2014  . IBS (irritable bowel syndrome)    Past Surgical History  Procedure Laterality Date  . Tonsillectomy    . Myomectomy    . Abdominal hysterectomy  2007    TAH/BSO  . Dilation and curettage of uterus    . Breast surgery  2006    LUMPECTOMY  . Cholecystectomy     Current Facility-Administered Medications  Medication Dose Route Frequency Provider Last Rate Last Dose  . 0.45 % sodium chloride infusion   Intravenous Continuous Edward Jolly, MD 75 mL/hr at 04/30/14 0745 75 mL/hr at 04/30/14 0745  . heparin injection 5,000 Units  5,000 Units Subcutaneous 3 times per day Edward Jolly, MD      . morphine (MSIR) tablet 15 mg  15 mg Oral Q4H PRN Edward Jolly, MD      . morphine 4 MG/ML injection 4 mg  4 mg Intravenous Q2H PRN Debby Freiberg, MD      . promethazine (PHENERGAN) injection 25 mg  25 mg Intravenous Q6H PRN Debby Freiberg, MD   25 mg at 04/29/14 2344   Exam BP 127/81  Pulse 107  Temp(Src) 99 F (37.2 C) (Oral)  Resp 18  Ht 5' 8"  (1.727 m)  Wt 197 lb (89.359 kg)  BMI 29.96 kg/m2  SpO2 96% Gen: NAD Lungs: Clear Abdomen: Soft with mild periumbilical  tenderness Neuro: Alert and oriented  A/P: Post op N/V.  Doubt and significant complication.  Admit for hydration and symptom control.  Edward Jolly MD, FACS  04/30/2014, 8:28 AM

## 2014-04-30 NOTE — Progress Notes (Signed)
Patient is alert and oriented X4, c/o nausea still but no vomiting.  Resting comfortably.

## 2014-05-01 NOTE — Progress Notes (Signed)
Pt states that the only time she feels dizzy and woozy is when she tries to go from a sitting to a standing position. Otherwise she feels fine walking. Notified MD on call, he instructed me to advise pt to move from sitting to a standing position very slowly and carefully, have someone with her for next 24 hrs, and to increase fluids, and if her symptoms get worse call the answering service for further instructions.

## 2014-05-01 NOTE — Progress Notes (Signed)
Came to visit patient at bedside on behalf of Link to [employer] program for [place] employees/dependents with [employer]. Patient denies having any Link to Wellness needs currently. However, she states she would appreciate hospital follow up call. Left brochure and contact information at bedside.  Marthenia Rolling, MSN- [facility] Liaison765 [phone]

## 2014-05-01 NOTE — Progress Notes (Signed)
NT reported that pt was on potty and stated that she "felt like she was going to pass out."  NT helped pt back to bed, and took VS. They were all WNL.  I spoke pt when she was back in bed, and she told me the same thing. I instructed pt to stay in bed until she felt better, then staff would help her up to see if she felt better. Will cotinue to monitor.

## 2014-05-01 NOTE — Discharge Instructions (Signed)
[facility] ______[facility] °LAPAROSCOPIC SURGERY: POST OP INSTRUCTIONS °Always review your discharge instruction sheet given to you by the facility where your surgery was performed. °IF YOU HAVE DISABILITY OR FAMILY LEAVE FORMS, YOU MUST BRING THEM TO THE OFFICE FOR PROCESSING.   °DO NOT GIVE THEM TO YOUR DOCTOR. ° °1. A prescription for pain medication may be given to you upon discharge.  Take your pain medication as prescribed, if needed.  If narcotic pain medicine is not needed, then you may take acetaminophen (Tylenol) or ibuprofen (Advil) as needed. °2. Take your usually prescribed medications unless otherwise directed. °3. If you need a refill on your pain medication, please contact your pharmacy.  They will contact our office to request authorization. Prescriptions will not be filled after 5pm or on week-ends. °4. You should follow a light diet the first few days after arrival home, such as soup and crackers, etc.  Be sure to include lots of fluids daily. °5. Most patients will experience some swelling and bruising in the area of the incisions.  Ice packs will help.  Swelling and bruising can take several days to resolve.  °6. It is common to experience some constipation if taking pain medication after surgery.  Increasing fluid intake and taking a stool softener (such as Colace) will usually help or prevent this problem from occurring.  A mild laxative (Milk of Magnesia or Miralax) should be taken according to package instructions if there are no bowel movements after 48 hours. °7. Unless discharge instructions indicate otherwise, you may remove your bandages 24-48 hours after surgery, and you may shower at that time.  You may have steri-strips (small skin tapes) in place directly over the incision.  These strips should be left on the skin for 7-10 days.  If your surgeon used skin glue on the incision, you may shower in 24 hours.  The glue will flake off over the next 2-3 weeks.  Any sutures or  staples will be removed at the office during your follow-up visit. °8. ACTIVITIES:  You may resume regular (light) daily activities beginning the next day--such as daily self-care, walking, climbing stairs--gradually increasing activities as tolerated.  You may have sexual intercourse when it is comfortable.  Refrain from any heavy lifting or straining until approved by your doctor. °a. You may drive when you are no longer taking prescription pain medication, you can comfortably wear a seatbelt, and you can safely maneuver your car and apply brakes. °b. RETURN TO WORK:  __________________________________________________________ °9. You should see your doctor in the office for a follow-up appointment approximately 2-3 weeks after your surgery.  Make sure that you call for this appointment within a day or two after you arrive home to insure a convenient appointment time. °10. OTHER INSTRUCTIONS: __________________________________________________________________________________________________________________________ __________________________________________________________________________________________________________________________ °WHEN TO CALL YOUR DOCTOR: °1. Fever over 101.0 °2. Inability to urinate °3. Continued bleeding from incision. °4. Increased pain, redness, or drainage from the incision. °5. Increasing abdominal pain ° °The clinic staff is available to answer your questions during regular business hours.  Please don’t hesitate to call and ask to speak to one of the nurses for clinical concerns.  If you have a medical emergency, go to the nearest emergency room or call 911.  A surgeon from [facility] is always on call at the hospital. °[address], [address], [place], [place]  [address] ? [address] [address], , [place]   [address] °[phone] ? [phone] ? FAX [phone] °Web site:   [email or web address] °

## 2014-05-01 NOTE — Discharge Summary (Signed)
Patient ID: Gabrielle Hawkins [id number] 48 y.o. [birth year]  04/29/2014  Discharge date and time: 05/01/2014   Admitting Physician: Excell Seltzer T  Discharge Physician: Excell Seltzer T  Admission Diagnoses: abd pain,emesis  Discharge Diagnoses: Postoperative nausea and vomiting  Operations: None  Admission Condition: fair  Discharged Condition: good  Indication for Admission: Patient is a 48 year old female who underwent laparoscopic cholecystectomy with cholangiogram for biliary dyskinesia the day prior to admission. After discharge she had persistent nausea and vomiting that she felt was intolerable and presented to the emergency department and was admitted for symptomatic management.  Hospital Course: The patient improved quickly in the hospital with IV fluids and antibiotics. She had no vomiting during her stay. On the morning of discharge she is tolerating clear liquids without nausea and states she feels significantly better. Abdomen is soft and nontender. Wounds are all clean.  Treatments: IV hydration and Anti-emetics  Disposition: Home  Patient Instructions:    Medication List         albuterol 108 (90 BASE) MCG/ACT inhaler  Commonly known as:  PROVENTIL HFA;VENTOLIN HFA  Inhale 1-2 puffs into the lungs every 6 (six) hours as needed for wheezing.     amitriptyline 10 MG tablet  Commonly known as:  ELAVIL  Take 10 mg by mouth at bedtime.     azaTHIOprine 50 MG tablet  Commonly known as:  IMURAN  Take 100 mg by mouth daily.     beclomethasone 80 MCG/ACT inhaler  Commonly known as:  QVAR  Inhale 1 puff into the lungs daily as needed (Shortness of Breath).     cyanocobalamin 1000 MCG/ML injection  Commonly known as:  (VITAMIN B-12)  Inject 1,000 mcg into the muscle every 30 (thirty) days.     cyclobenzaprine 10 MG tablet  Commonly known as:  FLEXERIL  Take 10 mg by mouth 2 (two) times daily as needed for muscle spasms.     diphenhydrAMINE  25 MG tablet  Commonly known as:  BENADRYL  Take 25 mg by mouth at bedtime as needed for sleep.     gabapentin 300 MG capsule  Commonly known as:  NEURONTIN  Take 300 mg by mouth daily.     hyoscyamine 0.125 MG tablet  Commonly known as:  LEVSIN, ANASPAZ  Take 0.125 mg by mouth every 4 (four) hours as needed for cramping.     megestrol 40 MG tablet  Commonly known as:  MEGACE  Take 0.5 tablets (20 mg total) by mouth at bedtime.     Melatonin 5 MG Caps  Take by mouth. As needed for sleep     meloxicam 7.5 MG tablet  Commonly known as:  MOBIC  Take 7.5 mg by mouth daily.     mesalamine 1.2 G EC tablet  Commonly known as:  LIALDA  Take 1.2 g by mouth 2 (two) times daily.     morphine 15 MG tablet  Commonly known as:  MSIR  Take 1 tablet (15 mg total) by mouth every 4 (four) hours as needed for severe pain.     pantoprazole 40 MG tablet  Commonly known as:  PROTONIX  Take 1 tablet (40 mg total) by mouth daily.     promethazine 25 MG tablet  Commonly known as:  PHENERGAN  Take 25 mg by mouth every 6 (six) hours as needed for nausea or vomiting.     traMADol 50 MG tablet  Commonly known as:  ULTRAM  Take 50 mg by mouth 3 (three)  times daily.        Activity: activity as tolerated Diet: regular diet Wound Care: none needed  Follow-up:  With Dr. Excell Seltzer in 3 weeks.  Signed: Edward Jolly MD, FACS  05/01/2014, 7:20 AM

## 2014-05-23 ENCOUNTER — Encounter (INDEPENDENT_AMBULATORY_CARE_PROVIDER_SITE_OTHER): Payer: Commercial Managed Care - PPO | Admitting: General Surgery

## 2014-07-07 ENCOUNTER — Encounter (HOSPITAL_COMMUNITY): Payer: Self-pay | Admitting: General Surgery

## 2014-07-16 ENCOUNTER — Other Ambulatory Visit: Payer: Self-pay | Admitting: Gastroenterology

## 2014-07-18 ENCOUNTER — Ambulatory Visit (HOSPITAL_COMMUNITY)
Admission: RE | Admit: 2014-07-18 | Discharge: 2014-07-18 | Disposition: A | Discharge status: Home / Self Care | Payer: 59 | Source: Ambulatory Visit | Attending: Gastroenterology | Admitting: Gastroenterology

## 2014-07-18 ENCOUNTER — Encounter (HOSPITAL_COMMUNITY): Payer: Self-pay

## 2014-07-18 ENCOUNTER — Encounter (HOSPITAL_COMMUNITY)
Admission: RE | Disposition: A | Discharge status: Home / Self Care | Payer: Self-pay | Source: Ambulatory Visit | Attending: Gastroenterology

## 2014-07-18 DIAGNOSIS — Z8673 Personal history of transient ischemic attack (TIA), and cerebral infarction without residual deficits: Secondary | ICD-10-CM | POA: Diagnosis not present

## 2014-07-18 DIAGNOSIS — D279 Benign neoplasm of unspecified ovary: Secondary | ICD-10-CM | POA: Diagnosis not present

## 2014-07-18 DIAGNOSIS — Z9221 Personal history of antineoplastic chemotherapy: Secondary | ICD-10-CM | POA: Diagnosis not present

## 2014-07-18 DIAGNOSIS — Z881 Allergy status to other antibiotic agents status: Secondary | ICD-10-CM | POA: Diagnosis not present

## 2014-07-18 DIAGNOSIS — Z923 Personal history of irradiation: Secondary | ICD-10-CM | POA: Diagnosis not present

## 2014-07-18 DIAGNOSIS — J45909 Unspecified asthma, uncomplicated: Secondary | ICD-10-CM | POA: Diagnosis not present

## 2014-07-18 DIAGNOSIS — Z888 Allergy status to other drugs, medicaments and biological substances status: Secondary | ICD-10-CM | POA: Insufficient documentation

## 2014-07-18 DIAGNOSIS — G629 Polyneuropathy, unspecified: Secondary | ICD-10-CM | POA: Insufficient documentation

## 2014-07-18 DIAGNOSIS — Z885 Allergy status to narcotic agent status: Secondary | ICD-10-CM | POA: Insufficient documentation

## 2014-07-18 DIAGNOSIS — K219 Gastro-esophageal reflux disease without esophagitis: Secondary | ICD-10-CM | POA: Diagnosis not present

## 2014-07-18 DIAGNOSIS — F329 Major depressive disorder, single episode, unspecified: Secondary | ICD-10-CM | POA: Diagnosis not present

## 2014-07-18 DIAGNOSIS — E559 Vitamin D deficiency, unspecified: Secondary | ICD-10-CM | POA: Diagnosis not present

## 2014-07-18 DIAGNOSIS — F419 Anxiety disorder, unspecified: Secondary | ICD-10-CM | POA: Insufficient documentation

## 2014-07-18 DIAGNOSIS — K5190 Ulcerative colitis, unspecified, without complications: Secondary | ICD-10-CM | POA: Diagnosis present

## 2014-07-18 DIAGNOSIS — Z853 Personal history of malignant neoplasm of breast: Secondary | ICD-10-CM | POA: Diagnosis not present

## 2014-07-18 DIAGNOSIS — M13879 Other specified arthritis, unspecified ankle and foot: Secondary | ICD-10-CM | POA: Insufficient documentation

## 2014-07-18 HISTORY — PX: FLEXIBLE SIGMOIDOSCOPY: SHX5431

## 2014-07-18 LAB — CLOSTRIDIUM DIFFICILE BY PCR: Toxigenic C. Difficile by PCR: NEGATIVE

## 2014-07-18 SURGERY — SIGMOIDOSCOPY, FLEXIBLE
Anesthesia: Moderate Sedation

## 2014-07-18 MED ORDER — MIDAZOLAM HCL 10 MG/2ML IJ SOLN
INTRAMUSCULAR | Status: AC
  Filled 2014-07-18: qty 4

## 2014-07-18 MED ORDER — MIDAZOLAM HCL 10 MG/2ML IJ SOLN
INTRAMUSCULAR | Status: DC | PRN
  Administered 2014-07-18 (×3): 2.5 mg via INTRAVENOUS

## 2014-07-18 MED ORDER — FENTANYL CITRATE 0.05 MG/ML IJ SOLN
INTRAMUSCULAR | Status: AC
  Filled 2014-07-18: qty 4

## 2014-07-18 MED ORDER — SODIUM CHLORIDE 0.9 % IV SOLN
INTRAVENOUS | Status: DC
  Administered 2014-07-18: 500 mL via INTRAVENOUS

## 2014-07-18 MED ORDER — ONDANSETRON HCL 4 MG/2ML IJ SOLN
INTRAMUSCULAR | Status: DC | PRN
  Administered 2014-07-18: 4 mg via INTRAVENOUS

## 2014-07-18 MED ORDER — DIPHENHYDRAMINE HCL 50 MG/ML IJ SOLN
INTRAMUSCULAR | Status: AC
  Filled 2014-07-18: qty 1

## 2014-07-18 MED ORDER — ONDANSETRON HCL 4 MG/2ML IJ SOLN
INTRAMUSCULAR | Status: AC
  Filled 2014-07-18: qty 2

## 2014-07-18 MED ORDER — FENTANYL CITRATE 0.05 MG/ML IJ SOLN
INTRAMUSCULAR | Status: DC | PRN
  Administered 2014-07-18 (×3): 25 ug via INTRAVENOUS

## 2014-07-18 NOTE — H&P (Signed)
  Gabrielle Hawkins HPI: This is a 48 year old female with findings of left sided UC that has been difficult to control.  She was able to go into remission on azathioprine, but then she had a flare of her disease with C. Diff.  Despite treatment of her C. Diff, she continues to have frequency with her bowel movements.  Past Medical History  Diagnosis Date  . Neuropathy   . Fibroid   . Endometriosis   . Mucinous cystadenoma of ovary     left  . BRCA1 negative   . BRCA2 negative   . History of chemotherapy 01/2005  . Radiation 04/2005  . Hx of tamoxifen therapy   . Migraines     just at dx of breast CA  . Vitamin D deficiency   . Lymphedema of arm   . Breast cancer 2006    chem/radiation/2years tamoxifen  . Stroke     related to when taken compazine  . Asthma   . Anxiety   . Depression   . Urinary tract infection     x 3  . Kidney stones   . GERD (gastroesophageal reflux disease)   . Peripheral neuropathy   . Arthritis     big toe   . Anemia 1998   . Hx of transfusion of packed red blood cells   . Vertigo 2014  . IBS (irritable bowel syndrome)     Past Surgical History  Procedure Laterality Date  . Tonsillectomy    . Myomectomy    . Abdominal hysterectomy  2007    TAH/BSO  . Dilation and curettage of uterus    . Breast surgery  2006    LUMPECTOMY  . Cholecystectomy    . Cholecystectomy N/A 04/29/2014    Procedure: LAPAROSCOPIC CHOLECYSTECTOMY WITH INTRAOPERATIVE CHOLANGIOGRAM;  Surgeon: Edward Jolly, MD;  Location: [facility];  Service: General;  Laterality: N/A;    Family History  Problem Relation Age of Onset  . Breast cancer Mother 46  . Heart failure Father   . Heart disease Father   . Cancer Maternal Grandmother     COLON  . Heart failure Paternal Grandmother   . Heart disease Paternal Grandmother     Social History:  reports that she has never smoked. She has never used smokeless tobacco. She reports that she does not drink alcohol or use illicit  drugs.  Allergies:  Allergies  Allergen Reactions  . Ampicillin Hives and Itching  . Compazine [Prochlorperazine Maleate] Other (See Comments)    "Stroke-like symptoms" Can tolerate Phenergan.  [name] Cymbalta [Duloxetine Hcl] Other (See Comments)    Fainting  . Imitrex [Sumatriptan Base] Hives and Nausea And Vomiting  . Lyrica [Pregabalin] Other (See Comments)    Fainting  . Metoclopramide Hcl Hives  . Percocet [Oxycodone-Acetaminophen] Itching  . Effexor [Venlafaxine Hydrochloride] Hives and Palpitations    Medications: Scheduled: Continuous:  No results found for this or any previous visit (from the past 24 hour(s)).   No results found.  ROS:  As stated above in the HPI otherwise negative.  There were no vitals taken for this visit.    PE: Gen: NAD, Alert and Oriented HEENT:  [place]/AT, EOMI Neck: Supple, no LAD Lungs: CTA Bilaterally CV: RRR without M/G/R ABM: Soft, NTND, +BS Ext: No C/C/E  Assessment/Plan: 1) Ulcerative colitis.  Plan: 1) FFS to evaluate the state of her disease.  Gabrielle Hawkins D 07/18/2014, 10:48 AM

## 2014-07-18 NOTE — Op Note (Signed)
[facility] [place], [address]   FLEXIBLE SIGMOIDOSCOPY PROCEDURE REPORT  PATIENT: Gabrielle, Hawkins  MR#: [id number] BIRTHDATE: [birth year] , 48  yrs. old GENDER: female ENDOSCOPIST: [name], MD REFERRED BY: PROCEDURE DATE:  07/18/2014 PROCEDURE:   Sigmoidoscopy with biopsy ASA CLASS:   Class III INDICATIONS:Ulcerative Colitis. MEDICATIONS: Fentanyl 75 mcg IV and Versed 6 mg IV  DESCRIPTION OF PROCEDURE:   After the risks benefits and alternatives of the procedure were thoroughly explained, informed consent was obtained.  Digital exam revealed no abnormalities of the rectum. The [id number]  endoscope was introduced through the anus and advanced to the splenic flexure , The exam was Without limitations.    The quality of the prep was The overall prep quality was excellent. .  The instrument was then slowly withdrawn as the mucosa was fully examined.         FINDINGS: A very mild colitis was identified in the rectum and the sigmoid colon.  some evidence of mild inflammation was noted in the descending colon, but the mucosa was normal proximal to the splenic flexure.  Cold biopsies were obatined as well as a sample for C. diff.    Retroflexed views revealed no abnormalities.    The scope was then withdrawn from the patient and the procedure terminated.  COMPLICATIONS: There were no immediate complications.  ENDOSCOPIC IMPRESSION: 1) Mild left sided colitis.  RECOMMENDATIONS: 1) Continue current medications. 2) Await the results of C. diff PCR and cold biopsies.  REPEAT EXAM:  eSigned:  [name], MD 07/18/2014 1:38 PM   CC:

## 2014-07-18 NOTE — Discharge Instructions (Signed)
Colonoscopy, Care After These instructions give you information on caring for yourself after your procedure. Your doctor may also give you more specific instructions. Call your doctor if you have any problems or questions after your procedure. HOME CARE  Do not drive for 24 hours.  Do not sign important papers or use machinery for 24 hours.  You may shower.  You may go back to your usual activities, but go slower for the first 24 hours.  Take rest breaks often during the first 24 hours.  Walk around or use warm packs on your belly (abdomen) if you have belly cramping or gas.  Drink enough fluids to keep your pee (urine) clear or pale yellow.  Resume your normal diet. Avoid heavy or fried foods.  Avoid drinking alcohol for 24 hours or as told by your doctor.  Only take medicines as told by your doctor. If a tissue sample (biopsy) was taken during the procedure:   Do not take aspirin or blood thinners for 7 days, or as told by your doctor.  Do not drink alcohol for 7 days, or as told by your doctor.  Eat soft foods for the first 24 hours. GET HELP IF: You still have a small amount of blood in your poop (stool) 2-3 days after the procedure. GET HELP RIGHT AWAY IF:  You have more than a small amount of blood in your poop.  You see clumps of tissue (blood clots) in your poop.  Your belly is puffy (swollen).  You feel sick to your stomach (nauseous) or throw up (vomit).  You have a fever.  You have belly pain that gets worse and medicine does not help. MAKE SURE YOU:  Understand these instructions.  Will watch your condition.  Will get help right away if you are not doing well or get worse. Document Released: 09/24/2010 Document Revised: 08/27/2013 Document Reviewed: 04/29/2013 [facility] Patient Information 2015 [place], [place]. This information is not intended to replace advice given to you by your health care provider. Make sure you discuss any questions you have with  your health care provider.  Conscious Sedation, Adult, Care After Refer to this sheet in the next few weeks. These instructions provide you with information on caring for yourself after your procedure. Your health care provider may also give you more specific instructions. Your treatment has been planned according to current medical practices, but problems sometimes occur. Call your health care provider if you have any problems or questions after your procedure. WHAT TO EXPECT AFTER THE PROCEDURE  After your procedure:  You may feel sleepy, clumsy, and have poor balance for several hours.  Vomiting may occur if you eat too soon after the procedure. HOME CARE INSTRUCTIONS  Do not participate in any activities where you could become injured for at least 24 hours. Do not:  Drive.  Swim.  Ride a bicycle.  Operate heavy machinery.  Cook.  Use power tools.  Climb ladders.  Work from a high place.  Do not make important decisions or sign legal documents until you are improved.  If you vomit, drink water, juice, or soup when you can drink without vomiting. Make sure you have little or no nausea before eating solid foods.  Only take over-the-counter or prescription medicines for pain, discomfort, or fever as directed by your health care provider.  Make sure you and your family fully understand everything about the medicines given to you, including what side effects may occur.  You should not drink alcohol,  take sleeping pills, or take medicines that cause drowsiness for at least 24 hours.  If you smoke, do not smoke without supervision.  If you are feeling better, you may resume normal activities 24 hours after you were sedated.  Keep all appointments with your health care provider. SEEK MEDICAL CARE IF:  Your skin is pale or bluish in color.  You continue to feel nauseous or vomit.  Your pain is getting worse and is not helped by medicine.  You have bleeding or  swelling.  You are still sleepy or feeling clumsy after 24 hours. SEEK IMMEDIATE MEDICAL CARE IF:  You develop a rash.  You have difficulty breathing.  You develop any type of allergic problem.  You have a fever. MAKE SURE YOU:  Understand these instructions.  Will watch your condition.  Will get help right away if you are not doing well or get worse. Document Released: 06/12/2013 Document Reviewed: 06/12/2013 [facility] Patient Information 2015 [place], [place]. This information is not intended to replace advice given to you by your health care provider. Make sure you discuss any questions you have with your health care provider.

## 2014-07-21 ENCOUNTER — Encounter (HOSPITAL_COMMUNITY): Payer: Self-pay | Admitting: Gastroenterology

## 2014-11-14 DIAGNOSIS — Z0289 Encounter for other administrative examinations: Secondary | ICD-10-CM

## 2015-01-05 ENCOUNTER — Inpatient Hospital Stay (HOSPITAL_COMMUNITY)
Admission: EM | Admit: 2015-01-05 | Discharge: 2015-01-07 | DRG: 387 | Disposition: A | Discharge status: Home / Self Care | Payer: BLUE CROSS/BLUE SHIELD | Attending: Internal Medicine | Admitting: Internal Medicine

## 2015-01-05 ENCOUNTER — Encounter (HOSPITAL_COMMUNITY): Payer: Self-pay | Admitting: Emergency Medicine

## 2015-01-05 ENCOUNTER — Emergency Department (HOSPITAL_COMMUNITY): Payer: BLUE CROSS/BLUE SHIELD

## 2015-01-05 DIAGNOSIS — Z923 Personal history of irradiation: Secondary | ICD-10-CM | POA: Diagnosis not present

## 2015-01-05 DIAGNOSIS — R1110 Vomiting, unspecified: Secondary | ICD-10-CM | POA: Diagnosis not present

## 2015-01-05 DIAGNOSIS — R1031 Right lower quadrant pain: Secondary | ICD-10-CM | POA: Diagnosis not present

## 2015-01-05 DIAGNOSIS — R109 Unspecified abdominal pain: Secondary | ICD-10-CM | POA: Diagnosis present

## 2015-01-05 DIAGNOSIS — J45909 Unspecified asthma, uncomplicated: Secondary | ICD-10-CM | POA: Diagnosis present

## 2015-01-05 DIAGNOSIS — Z853 Personal history of malignant neoplasm of breast: Secondary | ICD-10-CM | POA: Diagnosis not present

## 2015-01-05 DIAGNOSIS — R112 Nausea with vomiting, unspecified: Secondary | ICD-10-CM | POA: Diagnosis not present

## 2015-01-05 DIAGNOSIS — K529 Noninfective gastroenteritis and colitis, unspecified: Secondary | ICD-10-CM | POA: Diagnosis not present

## 2015-01-05 DIAGNOSIS — M1990 Unspecified osteoarthritis, unspecified site: Secondary | ICD-10-CM | POA: Diagnosis present

## 2015-01-05 DIAGNOSIS — Z803 Family history of malignant neoplasm of breast: Secondary | ICD-10-CM

## 2015-01-05 DIAGNOSIS — Z87442 Personal history of urinary calculi: Secondary | ICD-10-CM

## 2015-01-05 DIAGNOSIS — Z8249 Family history of ischemic heart disease and other diseases of the circulatory system: Secondary | ICD-10-CM

## 2015-01-05 DIAGNOSIS — K5190 Ulcerative colitis, unspecified, without complications: Principal | ICD-10-CM | POA: Diagnosis present

## 2015-01-05 DIAGNOSIS — G8929 Other chronic pain: Secondary | ICD-10-CM

## 2015-01-05 DIAGNOSIS — Z90710 Acquired absence of both cervix and uterus: Secondary | ICD-10-CM | POA: Diagnosis not present

## 2015-01-05 DIAGNOSIS — Z880 Allergy status to penicillin: Secondary | ICD-10-CM | POA: Diagnosis not present

## 2015-01-05 DIAGNOSIS — N390 Urinary tract infection, site not specified: Secondary | ICD-10-CM

## 2015-01-05 DIAGNOSIS — G629 Polyneuropathy, unspecified: Secondary | ICD-10-CM | POA: Diagnosis present

## 2015-01-05 DIAGNOSIS — K51919 Ulcerative colitis, unspecified with unspecified complications: Secondary | ICD-10-CM | POA: Diagnosis not present

## 2015-01-05 DIAGNOSIS — Z9221 Personal history of antineoplastic chemotherapy: Secondary | ICD-10-CM | POA: Diagnosis not present

## 2015-01-05 DIAGNOSIS — Z8673 Personal history of transient ischemic attack (TIA), and cerebral infarction without residual deficits: Secondary | ICD-10-CM

## 2015-01-05 HISTORY — DX: Cervicalgia: M54.9

## 2015-01-05 HISTORY — DX: Other chronic pain: G89.29

## 2015-01-05 HISTORY — DX: Paresthesia of skin: R20.2

## 2015-01-05 HISTORY — DX: Ulcerative colitis, unspecified, without complications: K51.90

## 2015-01-05 HISTORY — DX: Dorsalgia, unspecified: M54.2

## 2015-01-05 LAB — CBC WITH DIFFERENTIAL/PLATELET
BASOS PCT: 1 % (ref 0–1)
Basophils Absolute: 0 10*3/uL (ref 0.0–0.1)
EOS ABS: 0.2 10*3/uL (ref 0.0–0.7)
EOS PCT: 3 % (ref 0–5)
HCT: 42.6 % (ref 36.0–46.0)
HEMOGLOBIN: 13.9 g/dL (ref 12.0–15.0)
Lymphocytes Relative: 31 % (ref 12–46)
Lymphs Abs: 1.5 10*3/uL (ref 0.7–4.0)
MCH: 29 pg (ref 26.0–34.0)
MCHC: 32.6 g/dL (ref 30.0–36.0)
MCV: 88.9 fL (ref 78.0–100.0)
Monocytes Absolute: 0.5 10*3/uL (ref 0.1–1.0)
Monocytes Relative: 10 % (ref 3–12)
NEUTROS ABS: 2.7 10*3/uL (ref 1.7–7.7)
NEUTROS PCT: 55 % (ref 43–77)
PLATELETS: 178 10*3/uL (ref 150–400)
RBC: 4.79 MIL/uL (ref 3.87–5.11)
RDW: 13.1 % (ref 11.5–15.5)
WBC: 4.9 10*3/uL (ref 4.0–10.5)

## 2015-01-05 LAB — COMPREHENSIVE METABOLIC PANEL
ALBUMIN: 3.8 g/dL (ref 3.5–5.0)
ALT: 23 U/L (ref 14–54)
AST: 24 U/L (ref 15–41)
Alkaline Phosphatase: 92 U/L (ref 38–126)
Anion gap: 7 (ref 5–15)
BUN: 10 mg/dL (ref 6–20)
CO2: 28 mmol/L (ref 22–32)
CREATININE: 0.86 mg/dL (ref 0.44–1.00)
Calcium: 9.3 mg/dL (ref 8.9–10.3)
Chloride: 105 mmol/L (ref 101–111)
GFR calc Af Amer: >60 mL/min (ref >60)
GFR calc non Af Amer: >60 mL/min (ref >60)
Glucose, Bld: 109 mg/dL — ABNORMAL HIGH (ref 70–99)
Potassium: 3.9 mmol/L (ref 3.5–5.1)
Sodium: 140 mmol/L (ref 135–145)
Total Bilirubin: 0.6 mg/dL (ref 0.3–1.2)
Total Protein: 7.7 g/dL (ref 6.5–8.1)

## 2015-01-05 LAB — URINE MICROSCOPIC-ADD ON

## 2015-01-05 LAB — URINALYSIS, ROUTINE W REFLEX MICROSCOPIC
BILIRUBIN URINE: NEGATIVE
Glucose, UA: NEGATIVE mg/dL
Hgb urine dipstick: NEGATIVE
Ketones, ur: NEGATIVE mg/dL
Nitrite: POSITIVE — AB
Protein, ur: NEGATIVE mg/dL
Specific Gravity, Urine: 1.02 (ref 1.005–1.030)
Urobilinogen, UA: 0.2 mg/dL (ref 0.0–1.0)
pH: 5.5 (ref 5.0–8.0)

## 2015-01-05 LAB — LIPASE, BLOOD: Lipase: 23 U/L (ref 22–51)

## 2015-01-05 MED ORDER — SODIUM CHLORIDE 0.9 % IV SOLN: INTRAVENOUS | Status: DC

## 2015-01-05 MED ORDER — RISAQUAD PO CAPS
1.0000 | ORAL_CAPSULE | Freq: Every day | ORAL | Status: DC
  Administered 2015-01-05 – 2015-01-07 (×3): 1 via ORAL
  Filled 2015-01-05 (×3): qty 1

## 2015-01-05 MED ORDER — CIPROFLOXACIN IN D5W 400 MG/200ML IV SOLN
400.0000 mg | Freq: Once | INTRAVENOUS | Status: AC
  Administered 2015-01-05: 400 mg via INTRAVENOUS
  Filled 2015-01-05: qty 200

## 2015-01-05 MED ORDER — ONDANSETRON HCL 4 MG PO TABS: 4.0000 mg | ORAL_TABLET | Freq: Four times a day (QID) | ORAL | Status: DC | PRN

## 2015-01-05 MED ORDER — DIPHENHYDRAMINE HCL 25 MG PO TABS
25.0000 mg | ORAL_TABLET | Freq: Every evening | ORAL | Status: DC | PRN
  Filled 2015-01-05: qty 1

## 2015-01-05 MED ORDER — HEPARIN SODIUM (PORCINE) 5000 UNIT/ML IJ SOLN
5000.0000 [IU] | Freq: Three times a day (TID) | INTRAMUSCULAR | Status: DC
  Administered 2015-01-05 – 2015-01-07 (×5): 5000 [IU] via SUBCUTANEOUS
  Filled 2015-01-05 (×4): qty 1

## 2015-01-05 MED ORDER — GABAPENTIN 300 MG PO CAPS
300.0000 mg | ORAL_CAPSULE | Freq: Two times a day (BID) | ORAL | Status: DC
  Administered 2015-01-05 – 2015-01-07 (×4): 300 mg via ORAL
  Filled 2015-01-05 (×4): qty 1

## 2015-01-05 MED ORDER — HYOSCYAMINE SULFATE 0.125 MG PO TABS
0.1250 mg | ORAL_TABLET | ORAL | Status: DC | PRN
  Filled 2015-01-05: qty 1

## 2015-01-05 MED ORDER — METRONIDAZOLE IN NACL 5-0.79 MG/ML-% IV SOLN: 500.0000 mg | Freq: Three times a day (TID) | INTRAVENOUS | Status: DC

## 2015-01-05 MED ORDER — PROMETHAZINE HCL 25 MG/ML IJ SOLN
12.5000 mg | Freq: Once | INTRAMUSCULAR | Status: AC
  Administered 2015-01-05: 12.5 mg via INTRAVENOUS
  Filled 2015-01-05: qty 1

## 2015-01-05 MED ORDER — AZATHIOPRINE 50 MG PO TABS
225.0000 mg | ORAL_TABLET | Freq: Every day | ORAL | Status: DC
  Administered 2015-01-06 – 2015-01-07 (×2): 225 mg via ORAL
  Filled 2015-01-05 (×4): qty 5

## 2015-01-05 MED ORDER — ONDANSETRON HCL 4 MG/2ML IJ SOLN: 4.0000 mg | Freq: Three times a day (TID) | INTRAMUSCULAR | Status: DC | PRN

## 2015-01-05 MED ORDER — MESALAMINE 1.2 G PO TBEC
2.4000 g | DELAYED_RELEASE_TABLET | Freq: Two times a day (BID) | ORAL | Status: DC
  Administered 2015-01-05 – 2015-01-07 (×4): 2.4 g via ORAL
  Filled 2015-01-05 (×8): qty 2

## 2015-01-05 MED ORDER — SODIUM CHLORIDE 0.9 % IV SOLN
INTRAVENOUS | Status: DC
  Administered 2015-01-05 (×2): via INTRAVENOUS

## 2015-01-05 MED ORDER — ELUXADOLINE 75 MG PO TABS
75.0000 mg | ORAL_TABLET | Freq: Every day | ORAL | Status: DC
  Filled 2015-01-05 (×2): qty 1

## 2015-01-05 MED ORDER — IOHEXOL 300 MG/ML  SOLN
25.0000 mL | Freq: Once | INTRAMUSCULAR | Status: AC | PRN
  Administered 2015-01-05: 50 mL via ORAL

## 2015-01-05 MED ORDER — ONDANSETRON HCL 4 MG/2ML IJ SOLN
4.0000 mg | INTRAMUSCULAR | Status: AC | PRN
  Administered 2015-01-05 (×2): 4 mg via INTRAVENOUS
  Filled 2015-01-05 (×3): qty 2

## 2015-01-05 MED ORDER — FLUTICASONE PROPIONATE HFA 44 MCG/ACT IN AERO
INHALATION_SPRAY | RESPIRATORY_TRACT | Status: AC
  Filled 2015-01-05: qty 10.6

## 2015-01-05 MED ORDER — METRONIDAZOLE IN NACL 5-0.79 MG/ML-% IV SOLN
500.0000 mg | Freq: Three times a day (TID) | INTRAVENOUS | Status: DC
  Administered 2015-01-06 – 2015-01-07 (×5): 500 mg via INTRAVENOUS
  Filled 2015-01-05 (×4): qty 100

## 2015-01-05 MED ORDER — DICYCLOMINE HCL 10 MG PO CAPS
20.0000 mg | ORAL_CAPSULE | Freq: Four times a day (QID) | ORAL | Status: DC
  Administered 2015-01-05 – 2015-01-07 (×6): 20 mg via ORAL
  Filled 2015-01-05 (×6): qty 2

## 2015-01-05 MED ORDER — LORATADINE 10 MG PO TABS
10.0000 mg | ORAL_TABLET | Freq: Every day | ORAL | Status: DC
  Administered 2015-01-05 – 2015-01-07 (×3): 10 mg via ORAL
  Filled 2015-01-05 (×3): qty 1

## 2015-01-05 MED ORDER — MELATONIN 5 MG PO CAPS: 1.0000 | ORAL_CAPSULE | Freq: Every day | ORAL | Status: DC | PRN

## 2015-01-05 MED ORDER — CIPROFLOXACIN IN D5W 400 MG/200ML IV SOLN: 400.0000 mg | Freq: Two times a day (BID) | INTRAVENOUS | Status: DC

## 2015-01-05 MED ORDER — SODIUM CHLORIDE 0.9 % IV BOLUS (SEPSIS)
500.0000 mL | Freq: Once | INTRAVENOUS | Status: AC
  Administered 2015-01-05: 500 mL via INTRAVENOUS

## 2015-01-05 MED ORDER — CYANOCOBALAMIN 1000 MCG/ML IJ SOLN
1000.0000 ug | INTRAMUSCULAR | Status: DC
  Administered 2015-01-06: 1000 ug via INTRAMUSCULAR
  Filled 2015-01-05: qty 1

## 2015-01-05 MED ORDER — FLUTICASONE PROPIONATE HFA 44 MCG/ACT IN AERO
1.0000 | INHALATION_SPRAY | Freq: Two times a day (BID) | RESPIRATORY_TRACT | Status: DC
  Administered 2015-01-05 – 2015-01-07 (×4): 1 via RESPIRATORY_TRACT
  Filled 2015-01-05: qty 10.6

## 2015-01-05 MED ORDER — METRONIDAZOLE IN NACL 5-0.79 MG/ML-% IV SOLN
500.0000 mg | Freq: Once | INTRAVENOUS | Status: AC
  Administered 2015-01-05: 500 mg via INTRAVENOUS
  Filled 2015-01-05: qty 100

## 2015-01-05 MED ORDER — LORATADINE 10 MG PO TABS: 10.0000 mg | ORAL_TABLET | Freq: Every day | ORAL | Status: DC

## 2015-01-05 MED ORDER — ALBUTEROL SULFATE (2.5 MG/3ML) 0.083% IN NEBU: 3.0000 mL | INHALATION_SOLUTION | Freq: Four times a day (QID) | RESPIRATORY_TRACT | Status: DC | PRN

## 2015-01-05 MED ORDER — ONDANSETRON HCL 4 MG/2ML IJ SOLN
4.0000 mg | Freq: Four times a day (QID) | INTRAMUSCULAR | Status: DC | PRN
  Administered 2015-01-06: 4 mg via INTRAVENOUS
  Filled 2015-01-05: qty 2

## 2015-01-05 MED ORDER — IOHEXOL 300 MG/ML  SOLN
100.0000 mL | Freq: Once | INTRAMUSCULAR | Status: AC | PRN
  Administered 2015-01-05: 100 mL via INTRAVENOUS

## 2015-01-05 MED ORDER — CIPROFLOXACIN IN D5W 400 MG/200ML IV SOLN
400.0000 mg | Freq: Two times a day (BID) | INTRAVENOUS | Status: DC
  Administered 2015-01-06 – 2015-01-07 (×3): 400 mg via INTRAVENOUS
  Filled 2015-01-05 (×3): qty 200

## 2015-01-05 MED ORDER — ALBUTEROL SULFATE HFA 108 (90 BASE) MCG/ACT IN AERS
1.0000 | INHALATION_SPRAY | Freq: Four times a day (QID) | RESPIRATORY_TRACT | Status: DC | PRN
  Filled 2015-01-05: qty 6.7

## 2015-01-05 MED ORDER — NAPROXEN 250 MG PO TABS: 500.0000 mg | ORAL_TABLET | Freq: Three times a day (TID) | ORAL | Status: DC | PRN

## 2015-01-05 NOTE — ED Notes (Signed)
Pt reports abdominal pain and left flank pain since Wednesday. Pt reports n/v. nad noted.

## 2015-01-05 NOTE — ED Notes (Signed)
Attempted to relocate pt's IV from [facility] at pt's request. Attempts unsuccessful.

## 2015-01-05 NOTE — ED Notes (Signed)
Completed oral contrast.

## 2015-01-05 NOTE — ED Notes (Signed)
Pt still c/o nausea, EDP notified, verbal order to give PRN zofran now.

## 2015-01-05 NOTE — H&P (Signed)
[facility] History and Physical  Gabrielle Hawkins EHO:[id number] DOB: [birth year] DOA: 01/05/2015  Referring physician: ER, Dr. Thurnell Garbe PCP: No primary care provider on file.   Chief Complaint: Abdominal discomfort, nausea.  HPI: Gabrielle Hawkins is a 49 y.o. female  This is a very pleasant 49 year old lady who has a history of also colitis that was diagnosed approximately one year ago. She has been treated aggressively by her gastroenterologist for this disease and seems to have done well except that she does seem to have chronic diarrhea. 5 days ago, she started to have lower abdominal pain/discomfort associated with intractable nausea. She did not vomit but she was afraid to eat anything and has not had good by mouth intake for the last few days. She has not had any vomiting. She also describes left loin discomfort/pain. She denies any urinary symptoms such as dysuria, increased frequency of micturition. She denies any fever. There is not been any bloody diarrhea. She takes immunosuppressive therapy for her ulcerative colitis. She is now being admitted for further management.   Review of Systems:  Apart from symptoms above, all systems negative.  Past Medical History  Diagnosis Date  . Neuropathy   . Fibroid   . Endometriosis   . Mucinous cystadenoma of ovary     left  . BRCA1 negative   . BRCA2 negative   . History of chemotherapy 01/2005  . Radiation 04/2005  . Hx of tamoxifen therapy   . Migraines     just at dx of breast CA  . Vitamin D deficiency   . Lymphedema of arm   . Breast cancer 2006    chem/radiation/2years tamoxifen  . Stroke     related to when taken compazine  . Asthma   . Anxiety   . Depression   . Urinary tract infection     x 3  . Kidney stones   . GERD (gastroesophageal reflux disease)   . Peripheral neuropathy   . Arthritis     big toe   . Anemia 1998   . Hx of transfusion of packed red blood cells   . Vertigo 2014  . IBS (irritable  bowel syndrome)   . Paresthesias   . Ulcerative colitis     with chronic diarrhea  . Chronic neck and back pain    Past Surgical History  Procedure Laterality Date  . Tonsillectomy    . Myomectomy    . Abdominal hysterectomy  2007    TAH/BSO  . Dilation and curettage of uterus    . Breast surgery  2006    LUMPECTOMY  . Cholecystectomy    . Cholecystectomy N/A 04/29/2014    Procedure: LAPAROSCOPIC CHOLECYSTECTOMY WITH INTRAOPERATIVE CHOLANGIOGRAM;  Surgeon: Edward Jolly, MD;  Location: [facility] ORS;  Service: General;  Laterality: N/A;  . Flexible sigmoidoscopy N/A 07/18/2014    Procedure: FLEXIBLE SIGMOIDOSCOPY;  Surgeon: Beryle Beams, MD;  Location: [facility] ENDOSCOPY;  Service: Endoscopy;  Laterality: N/A;   Social History:  reports that she has never smoked. She has never used smokeless tobacco. She reports that she does not drink alcohol or use illicit drugs.  Allergies  Allergen Reactions  . Ampicillin Hives and Itching  . Compazine [Prochlorperazine Maleate] Other (See Comments)    "Stroke-like symptoms" Can tolerate Phenergan.  [name] Cymbalta [Duloxetine Hcl] Other (See Comments)    Fainting  . Imitrex [Sumatriptan Base] Hives and Nausea And Vomiting  . Lyrica [Pregabalin] Other (See Comments)    Fainting  .  Metoclopramide Hcl Hives  . Percocet [Oxycodone-Acetaminophen] Itching  . Effexor [Venlafaxine Hydrochloride] Hives and Palpitations    Family History  Problem Relation Age of Onset  . Breast cancer Mother 84  . Heart failure Father   . Heart disease Father   . Cancer Maternal Grandmother     COLON  . Heart failure Paternal Grandmother   . Heart disease Paternal Grandmother     Prior to Admission medications   Medication Sig Start Date End Date Taking? Authorizing Provider  acidophilus (RISAQUAD) CAPS capsule Take 1 capsule by mouth daily.   Yes Historical Provider, MD  albuterol (PROVENTIL HFA;VENTOLIN HFA) 108 (90 BASE) MCG/ACT inhaler Inhale 1-2 puffs into  the lungs every 6 (six) hours as needed for wheezing. 05/08/13  Yes Janne Napoleon, NP  azaTHIOprine (IMURAN) 50 MG tablet Take 225 mg by mouth daily.    Yes Historical Provider, MD  beclomethasone (QVAR) 80 MCG/ACT inhaler Inhale 1 puff into the lungs daily as needed (Shortness of Breath).   Yes Historical Provider, MD  cetirizine (ZYRTEC) 10 MG tablet Take 10 mg by mouth daily.   Yes Historical Provider, MD  cyanocobalamin (,VITAMIN B-12,) 1000 MCG/ML injection Inject 1,000 mcg into the muscle every 30 (thirty) days.   Yes Historical Provider, MD  dicyclomine (BENTYL) 20 MG tablet Take 20 mg by mouth 4 (four) times daily.   Yes Historical Provider, MD  diphenhydrAMINE (BENADRYL) 25 MG tablet Take 25 mg by mouth at bedtime as needed for sleep.   Yes Historical Provider, MD  Eluxadoline 75 MG TABS Take 1 tablet by mouth daily.   Yes Historical Provider, MD  gabapentin (NEURONTIN) 300 MG capsule Take 300 mg by mouth 2 (two) times daily.    Yes Historical Provider, MD  hyoscyamine (LEVSIN, ANASPAZ) 0.125 MG tablet Take 0.125 mg by mouth every 4 (four) hours as needed for cramping.   Yes Historical Provider, MD  Melatonin 5 MG CAPS Take 1 capsule by mouth daily as needed (sleep).    Yes Historical Provider, MD  mesalamine (LIALDA) 1.2 G EC tablet Take 2.4 g by mouth 2 (two) times daily.    Yes Historical Provider, MD  naproxen (NAPROSYN) 500 MG tablet Take 500 mg by mouth 3 (three) times daily as needed for moderate pain.   Yes Historical Provider, MD  ondansetron (ZOFRAN) 4 MG tablet Take 4 mg by mouth every 6 (six) hours as needed for nausea or vomiting.   Yes Historical Provider, MD  megestrol (MEGACE) 40 MG tablet Take 0.5 tablets (20 mg total) by mouth at bedtime. Patient not taking: Reported on 01/05/2015 03/24/13   Chauncey Cruel, MD  morphine (MSIR) 15 MG tablet Take 1 tablet (15 mg total) by mouth every 4 (four) hours as needed for severe pain. Patient not taking: Reported on 01/05/2015 04/29/14    Excell Seltzer, MD  pantoprazole (PROTONIX) 40 MG tablet Take 1 tablet (40 mg total) by mouth daily. Patient not taking: Reported on 01/05/2015 03/24/13   Chauncey Cruel, MD   Physical Exam: Filed Vitals:   01/05/15 1311 01/05/15 1330 01/05/15 1400 01/05/15 1550  BP:  116/91 119/77 124/81  Pulse: 83 74 71 88  Temp:      TempSrc:      Resp:    16  Height:      Weight:      SpO2: 98% 98% 99% 99%    Wt Readings from Last 3 Encounters:  01/05/15 87.998 kg (194 lb)  04/30/14 89.359  kg (197 lb)  04/29/14 88.451 kg (195 lb)    General:  Appears uncomfortable with abdominal discomfort. She does not look toxic or septic.  Eyes: PERRL, normal lids, irises & conjunctiva ENT: grossly normal hearing, lips & tongue Neck: no LAD, masses or thyromegaly Cardiovascular: RRR, no m/r/g. No LE edema. Telemetry: SR, no arrhythmias  Respiratory: CTA bilaterally, no w/r/r. Normal respiratory effort. Abdomen: soft, ntnd. Minimal tenderness in the right lower quadrant. Bowel sounds are heard, somewhat scanty.  Skin: no rash or induration seen on limited exam Musculoskeletal: grossly normal tone BUE/BLE Psychiatric: grossly normal mood and affect, speech fluent and appropriate Neurologic: grossly non-focal.          Labs on Admission:  Basic Metabolic Panel:  Recent Labs Lab 01/05/15 1316  NA 140  K 3.9  CL 105  CO2 28  GLUCOSE 109*  BUN 10  CREATININE 0.86  CALCIUM 9.3   Liver Function Tests:  Recent Labs Lab 01/05/15 1316  AST 24  ALT 23  ALKPHOS 92  BILITOT 0.6  PROT 7.7  ALBUMIN 3.8    Recent Labs Lab 01/05/15 1316  LIPASE 23   No results for input(s): AMMONIA in the last 168 hours. CBC:  Recent Labs Lab 01/05/15 1316  WBC 4.9  NEUTROABS 2.7  HGB 13.9  HCT 42.6  MCV 88.9  PLT 178   Cardiac Enzymes: No results for input(s): CKTOTAL, CKMB, CKMBINDEX, TROPONINI in the last 168 hours.  BNP (last 3 results) No results for input(s): BNP in the last 8760  hours.  ProBNP (last 3 results) No results for input(s): PROBNP in the last 8760 hours.  CBG: No results for input(s): GLUCAP in the last 168 hours.  Radiological Exams on Admission: Ct Abdomen Pelvis W Contrast  01/05/2015   CLINICAL DATA:  LEFT abdominal and flank pain since 01/01/2015, past history of kidney stones, ulcerative colitis, RIGHT breast cancer post lumpectomy, radiation therapy and chemotherapy, endometriosis, asthma  EXAM: CT ABDOMEN AND PELVIS WITH CONTRAST  TECHNIQUE: Multidetector CT imaging of the abdomen and pelvis was performed using the standard protocol following bolus administration of intravenous contrast. Sagittal and coronal MPR images reconstructed from axial data set. Due to vomiting after initiation of imaging, delayed imaging was performed throughout the entirety of the abdomen and pelvis.  CONTRAST:  72m OMNIPAQUE IOHEXOL 300 MG/ML SOLN orally, 1054mOMNIPAQUE IOHEXOL 300 MG/ML SOLN IV  COMPARISON:  02/01/2014  FINDINGS: Lung bases clear.  Small RIGHT renal cysts.  Liver, spleen, pancreas, kidneys and adrenal glands otherwise normal.  Gallbladder surgically absent.  Wall thickening of the cecum and terminal ileum consistent with an enteritis.  Question minimal rectal wall thickening versus artifact from underdistention.  Remainder of colon however is normal.  Stomach and remaining small bowel loops normal appearance.  Cluster of normal size lymph nodes in mesentery medial to cecum/ascending colon.  No mass, additional adenopathy, free fluid, or free air.  Uterus surgically absent with nonvisualization of ovaries and appendix.  Bulging versus herniated disc centrally at L4-L5, appearance unchanged.  No acute osseous findings.  IMPRESSION: Wall thickening of the cecum and terminal ileum compatible with enteritis, could represent inflammatory bowel disease or infection.  No evidence of perforation or abscess.  Clustered lymph nodes in mesentery medial to the cecum, normal to  upper normal in size.   Electronically Signed   By: MaLavonia Dana.D.   On: 01/05/2015 16:42      Assessment/Plan   1. Abdominal pain. Etiology appears  to be enteritis affecting the cecum and terminal ileum. This may be related to her inflammatory bowel disease. We will treat her empirically with intravenous antibiotics, ciprofloxacin and metronidazole. We will request gastroenterology consultation. She may need steroids intravenously. 2. Ulcerative colitis. Continue with immunosuppressive therapy. Further recommendations from gastroenterology consultation.    Further recommendations will depend on patient's hospital progress.  Code Status: full code.   DVT Prophylaxis: heparin   Family Communication: I discussed the plan with the patient at the bedside.    Disposition Plan: home when medically stable.  Time spent: 60 minutes.   Doree Albee [facility] Pager [phone].

## 2015-01-05 NOTE — ED Provider Notes (Signed)
CSN: [id number]     Arrival date & time 01/05/15  1227 History   First MD Initiated Contact with Patient 01/05/15 1322     Chief Complaint  Patient presents with  . Abdominal Pain     HPI Pt was seen at 1340.  Per pt, c/o gradual onset and persistence of constant left sided abd "pain" for the past 5 days.  Has been associated with nausea.  Describes the abd pain as "throbbing" with radiation into the left side of her lower back.  Denies any change in her chronic diarrhea. Denies vomiting, no fevers, no back pain, no rash, no CP/SOB, no black or blood in stools.      Past Medical History  Diagnosis Date  . Neuropathy   . Fibroid   . Endometriosis   . Mucinous cystadenoma of ovary     left  . BRCA1 negative   . BRCA2 negative   . History of chemotherapy 01/2005  . Radiation 04/2005  . Hx of tamoxifen therapy   . Migraines     just at dx of breast CA  . Vitamin D deficiency   . Lymphedema of arm   . Breast cancer 2006    chem/radiation/2years tamoxifen  . Stroke     related to when taken compazine  . Asthma   . Anxiety   . Depression   . Urinary tract infection     x 3  . Kidney stones   . GERD (gastroesophageal reflux disease)   . Peripheral neuropathy   . Arthritis     big toe   . Anemia 1998   . Hx of transfusion of packed red blood cells   . Vertigo 2014  . IBS (irritable bowel syndrome)   . Paresthesias   . Ulcerative colitis     with chronic diarrhea  . Chronic neck and back pain    Past Surgical History  Procedure Laterality Date  . Tonsillectomy    . Myomectomy    . Abdominal hysterectomy  2007    TAH/BSO  . Dilation and curettage of uterus    . Breast surgery  2006    LUMPECTOMY  . Cholecystectomy    . Cholecystectomy N/A 04/29/2014    Procedure: LAPAROSCOPIC CHOLECYSTECTOMY WITH INTRAOPERATIVE CHOLANGIOGRAM;  Surgeon: Edward Jolly, MD;  Location: [facility];  Service: General;  Laterality: N/A;  . Flexible sigmoidoscopy N/A 07/18/2014   Procedure: FLEXIBLE SIGMOIDOSCOPY;  Surgeon: Beryle Beams, MD;  Location: [facility];  Service: Endoscopy;  Laterality: N/A;   Family History  Problem Relation Age of Onset  . Breast cancer Mother 77  . Heart failure Father   . Heart disease Father   . Cancer Maternal Grandmother     COLON  . Heart failure Paternal Grandmother   . Heart disease Paternal Grandmother    History  Substance Use Topics  . Smoking status: Never Smoker   . Smokeless tobacco: Never Used  . Alcohol Use: No   OB History    Gravida Para Term Preterm AB TAB SAB Ectopic Multiple Living   1 0   1  1   0     Review of Systems ROS: Statement: All systems negative except as marked or noted in the HPI; Constitutional: Negative for fever and chills. ; ; Eyes: Negative for eye pain, redness and discharge. ; ; ENMT: Negative for ear pain, hoarseness, nasal congestion, sinus pressure and sore throat. ; ; Cardiovascular: Negative for chest pain, palpitations, diaphoresis, dyspnea  and peripheral edema. ; ; Respiratory: Negative for cough, wheezing and stridor. ; ; Gastrointestinal: +nausea, abd pain, chronic diarrhea. Negative for vomiting, blood in stool, hematemesis, jaundice and rectal bleeding. . ; ; Genitourinary: Negative for dysuria, flank pain and hematuria. ; ; Musculoskeletal: Negative for back pain and neck pain. Negative for swelling and trauma.; ; Skin: Negative for pruritus, rash, abrasions, blisters, bruising and skin lesion.; ; Neuro: Negative for headache, lightheadedness and neck stiffness. Negative for weakness, altered level of consciousness , altered mental status, extremity weakness, paresthesias, involuntary movement, seizure and syncope.      Allergies  Ampicillin; Compazine; Cymbalta; Imitrex; Lyrica; Metoclopramide hcl; Percocet; and Effexor  Home Medications   Prior to Admission medications   Medication Sig Start Date End Date Taking? Authorizing Provider  acidophilus (RISAQUAD) CAPS capsule  Take 1 capsule by mouth daily.   Yes Historical Provider, MD  albuterol (PROVENTIL HFA;VENTOLIN HFA) 108 (90 BASE) MCG/ACT inhaler Inhale 1-2 puffs into the lungs every 6 (six) hours as needed for wheezing. 05/08/13  Yes Janne Napoleon, NP  azaTHIOprine (IMURAN) 50 MG tablet Take 225 mg by mouth daily.    Yes Historical Provider, MD  beclomethasone (QVAR) 80 MCG/ACT inhaler Inhale 1 puff into the lungs daily as needed (Shortness of Breath).   Yes Historical Provider, MD  cetirizine (ZYRTEC) 10 MG tablet Take 10 mg by mouth daily.   Yes Historical Provider, MD  cyanocobalamin (,VITAMIN B-12,) 1000 MCG/ML injection Inject 1,000 mcg into the muscle every 30 (thirty) days.   Yes Historical Provider, MD  dicyclomine (BENTYL) 20 MG tablet Take 20 mg by mouth 4 (four) times daily.   Yes Historical Provider, MD  diphenhydrAMINE (BENADRYL) 25 MG tablet Take 25 mg by mouth at bedtime as needed for sleep.   Yes Historical Provider, MD  Eluxadoline 75 MG TABS Take 1 tablet by mouth daily.   Yes Historical Provider, MD  gabapentin (NEURONTIN) 300 MG capsule Take 300 mg by mouth 2 (two) times daily.    Yes Historical Provider, MD  hyoscyamine (LEVSIN, ANASPAZ) 0.125 MG tablet Take 0.125 mg by mouth every 4 (four) hours as needed for cramping.   Yes Historical Provider, MD  Melatonin 5 MG CAPS Take 1 capsule by mouth daily as needed (sleep).    Yes Historical Provider, MD  mesalamine (LIALDA) 1.2 G EC tablet Take 2.4 g by mouth 2 (two) times daily.    Yes Historical Provider, MD  naproxen (NAPROSYN) 500 MG tablet Take 500 mg by mouth 3 (three) times daily as needed for moderate pain.   Yes Historical Provider, MD  ondansetron (ZOFRAN) 4 MG tablet Take 4 mg by mouth every 6 (six) hours as needed for nausea or vomiting.   Yes Historical Provider, MD  megestrol (MEGACE) 40 MG tablet Take 0.5 tablets (20 mg total) by mouth at bedtime. Patient not taking: Reported on 01/05/2015 03/24/13   Chauncey Cruel, MD  morphine (MSIR)  15 MG tablet Take 1 tablet (15 mg total) by mouth every 4 (four) hours as needed for severe pain. Patient not taking: Reported on 01/05/2015 04/29/14   Excell Seltzer, MD  pantoprazole (PROTONIX) 40 MG tablet Take 1 tablet (40 mg total) by mouth daily. Patient not taking: Reported on 01/05/2015 03/24/13   Chauncey Cruel, MD   BP 119/77 mmHg  Pulse 71  Temp(Src) 98.9 F (37.2 C) (Oral)  Resp 18  Ht 5' 8.5" (1.74 m)  Wt 194 lb (87.998 kg)  BMI 29.07 kg/m2  SpO2 99% Physical Exam  1345: Physical examination:  Nursing notes reviewed; Vital signs and O2 SAT reviewed;  Constitutional: Well developed, Well nourished, Well hydrated, In no acute distress; Head:  Normocephalic, atraumatic; Eyes: EOMI, PERRL, No scleral icterus; ENMT: Mouth and pharynx normal, Mucous membranes moist; Neck: Supple, Full range of motion, No lymphadenopathy; Cardiovascular: Regular rate and rhythm, No gallop; Respiratory: Breath sounds clear & equal bilaterally, No wheezes.  Speaking full sentences with ease, Normal respiratory effort/excursion; Chest: Nontender, Movement normal; Abdomen: Soft, +LUQ and LLQ tenderness to palp. No rebound or guarding. Nondistended, Normal bowel sounds; Genitourinary: No CVA tenderness; Extremities: Pulses normal, No tenderness, No edema, No calf edema or asymmetry.; Neuro: AA&Ox3, Major CN grossly intact.  Speech clear. No gross focal motor or sensory deficits in extremities.; Skin: Color normal, Warm, Dry.   ED Course  Procedures   1350:  Pt states she "is scared to take any pain medicines because I get throwing up and throwing up." Agreeable to take IV zofran at this time.   1700:  Pt continues to c/o nausea and has vomited several times despite multiple doses of IV zofran. Will dose IV phenergan. +UTI, UC is pending. CT scan with enteritis. Will dose IV cipro and flagyl. Dx and testing d/w pt and family.  Questions answered.  Verb understanding, agreeable to admit.  T/C to [facility].  Anastasio Champion, case discussed, including:  HPI, pertinent PM/SHx, VS/PE, dx testing, ED course and treatment:  Agreeable to admit, requests to write temporary orders, obtain medical bed to team APAdmits.     MDM  MDM Reviewed: previous chart, nursing note and vitals Reviewed previous: labs Interpretation: labs and CT scan      Results for orders placed or performed during the hospital encounter of 01/05/15  CBC with Differential  Result Value Ref Range   WBC 4.9 4.0 - 10.5 K/uL   RBC 4.79 3.87 - 5.11 MIL/uL   Hemoglobin 13.9 12.0 - 15.0 g/dL   HCT 42.6 36.0 - 46.0 %   MCV 88.9 78.0 - 100.0 fL   MCH 29.0 26.0 - 34.0 pg   MCHC 32.6 30.0 - 36.0 g/dL   RDW 13.1 11.5 - 15.5 %   Platelets 178 150 - 400 K/uL   Neutrophils Relative % 55 43 - 77 %   Neutro Abs 2.7 1.7 - 7.7 K/uL   Lymphocytes Relative 31 12 - 46 %   Lymphs Abs 1.5 0.7 - 4.0 K/uL   Monocytes Relative 10 3 - 12 %   Monocytes Absolute 0.5 0.1 - 1.0 K/uL   Eosinophils Relative 3 0 - 5 %   Eosinophils Absolute 0.2 0.0 - 0.7 K/uL   Basophils Relative 1 0 - 1 %   Basophils Absolute 0.0 0.0 - 0.1 K/uL  Comprehensive metabolic panel  Result Value Ref Range   Sodium 140 135 - 145 mmol/L   Potassium 3.9 3.5 - 5.1 mmol/L   Chloride 105 101 - 111 mmol/L   CO2 28 22 - 32 mmol/L   Glucose, Bld 109 (H) 70 - 99 mg/dL   BUN 10 6 - 20 mg/dL   Creatinine, Ser 0.86 0.44 - 1.00 mg/dL   Calcium 9.3 8.9 - 10.3 mg/dL   Total Protein 7.7 6.5 - 8.1 g/dL   Albumin 3.8 3.5 - 5.0 g/dL   AST 24 15 - 41 U/L   ALT 23 14 - 54 U/L   Alkaline Phosphatase 92 38 - 126 U/L   Total Bilirubin 0.6  0.3 - 1.2 mg/dL   GFR calc non Af Amer >60 >60 mL/min   GFR calc Af Amer >60 >60 mL/min   Anion gap 7 5 - 15  Urinalysis, Routine w reflex microscopic  Result Value Ref Range   Color, Urine YELLOW YELLOW   APPearance HAZY (A) CLEAR   Specific Gravity, Urine 1.020 1.005 - 1.030   pH 5.5 5.0 - 8.0   Glucose, UA NEGATIVE NEGATIVE mg/dL   Hgb urine  dipstick NEGATIVE NEGATIVE   Bilirubin Urine NEGATIVE NEGATIVE   Ketones, ur NEGATIVE NEGATIVE mg/dL   Protein, ur NEGATIVE NEGATIVE mg/dL   Urobilinogen, UA 0.2 0.0 - 1.0 mg/dL   Nitrite POSITIVE (A) NEGATIVE   Leukocytes, UA TRACE (A) NEGATIVE  Lipase, blood  Result Value Ref Range   Lipase 23 22 - 51 U/L  Urine microscopic-add on  Result Value Ref Range   Squamous Epithelial / LPF FEW (A) RARE   WBC, UA 3-6 <3 WBC/hpf   RBC / HPF 0-2 <3 RBC/hpf   Bacteria, UA MANY (A) RARE   Ct Abdomen Pelvis W Contrast 01/05/2015   CLINICAL DATA:  LEFT abdominal and flank pain since 01/01/2015, past history of kidney stones, ulcerative colitis, RIGHT breast cancer post lumpectomy, radiation therapy and chemotherapy, endometriosis, asthma  EXAM: CT ABDOMEN AND PELVIS WITH CONTRAST  TECHNIQUE: Multidetector CT imaging of the abdomen and pelvis was performed using the standard protocol following bolus administration of intravenous contrast. Sagittal and coronal MPR images reconstructed from axial data set. Due to vomiting after initiation of imaging, delayed imaging was performed throughout the entirety of the abdomen and pelvis.  CONTRAST:  55m OMNIPAQUE IOHEXOL 300 MG/ML SOLN orally, 1025mOMNIPAQUE IOHEXOL 300 MG/ML SOLN IV  COMPARISON:  02/01/2014  FINDINGS: Lung bases clear.  Small RIGHT renal cysts.  Liver, spleen, pancreas, kidneys and adrenal glands otherwise normal.  Gallbladder surgically absent.  Wall thickening of the cecum and terminal ileum consistent with an enteritis.  Question minimal rectal wall thickening versus artifact from underdistention.  Remainder of colon however is normal.  Stomach and remaining small bowel loops normal appearance.  Cluster of normal size lymph nodes in mesentery medial to cecum/ascending colon.  No mass, additional adenopathy, free fluid, or free air.  Uterus surgically absent with nonvisualization of ovaries and appendix.  Bulging versus herniated disc centrally at  L4-L5, appearance unchanged.  No acute osseous findings.  IMPRESSION: Wall thickening of the cecum and terminal ileum compatible with enteritis, could represent inflammatory bowel disease or infection.  No evidence of perforation or abscess.  Clustered lymph nodes in mesentery medial to the cecum, normal to upper normal in size.   Electronically Signed   By: MaLavonia Dana.D.   On: 01/05/2015 16:42      KaFrancine GravenDO 01/08/15 [id number]

## 2015-01-06 ENCOUNTER — Encounter (HOSPITAL_COMMUNITY): Payer: Self-pay | Admitting: Gastroenterology

## 2015-01-06 DIAGNOSIS — G8929 Other chronic pain: Secondary | ICD-10-CM

## 2015-01-06 DIAGNOSIS — K51919 Ulcerative colitis, unspecified with unspecified complications: Secondary | ICD-10-CM

## 2015-01-06 DIAGNOSIS — R1031 Right lower quadrant pain: Secondary | ICD-10-CM

## 2015-01-06 DIAGNOSIS — K5190 Ulcerative colitis, unspecified, without complications: Principal | ICD-10-CM

## 2015-01-06 LAB — URINE CULTURE
CULTURE: NO GROWTH
Colony Count: NO GROWTH

## 2015-01-06 LAB — COMPREHENSIVE METABOLIC PANEL
ALK PHOS: 84 U/L (ref 38–126)
ALT: 19 U/L (ref 14–54)
AST: 19 U/L (ref 15–41)
Albumin: 3.1 g/dL — ABNORMAL LOW (ref 3.5–5.0)
Anion gap: 5 (ref 5–15)
BUN: 9 mg/dL (ref 6–20)
CO2: 26 mmol/L (ref 22–32)
Calcium: 8.5 mg/dL — ABNORMAL LOW (ref 8.9–10.3)
Chloride: 107 mmol/L (ref 101–111)
Creatinine, Ser: 0.86 mg/dL (ref 0.44–1.00)
GFR calc non Af Amer: >60 mL/min (ref >60)
GLUCOSE: 90 mg/dL (ref 70–99)
POTASSIUM: 3.6 mmol/L (ref 3.5–5.1)
Sodium: 138 mmol/L (ref 135–145)
TOTAL PROTEIN: 6.4 g/dL — AB (ref 6.5–8.1)
Total Bilirubin: 0.5 mg/dL (ref 0.3–1.2)

## 2015-01-06 LAB — CBC
HCT: 38.6 % (ref 36.0–46.0)
Hemoglobin: 12.5 g/dL (ref 12.0–15.0)
MCH: 28.4 pg (ref 26.0–34.0)
MCHC: 32.4 g/dL (ref 30.0–36.0)
MCV: 87.7 fL (ref 78.0–100.0)
Platelets: 166 10*3/uL (ref 150–400)
RBC: 4.4 MIL/uL (ref 3.87–5.11)
RDW: 12.8 % (ref 11.5–15.5)
WBC: 3.9 10*3/uL — ABNORMAL LOW (ref 4.0–10.5)

## 2015-01-06 MED ORDER — DIPHENHYDRAMINE HCL 25 MG PO CAPS: 25.0000 mg | ORAL_CAPSULE | Freq: Every evening | ORAL | Status: DC | PRN

## 2015-01-06 MED ORDER — ONDANSETRON 4 MG PO TBDP
4.0000 mg | ORAL_TABLET | Freq: Three times a day (TID) | ORAL | Status: DC
  Administered 2015-01-06 – 2015-01-07 (×4): 4 mg via ORAL
  Filled 2015-01-06 (×4): qty 1

## 2015-01-06 NOTE — Consult Note (Signed)
Referring Provider: Dr. Anastasio Champion Primary Gastroenterologist:  Dr. Benson Norway   Date of Admission: 01/05/15 Date of Consultation: 01/06/15  Reason for Consultation:  Abdominal pain, ulcerative colitis   HPI:  Gabrielle Hawkins is a 49 y.o. year old female with a history of ulcerative colitis, diagnosed in June 2015 by Dr. Benson Norway. Last lower GI evaluation via flex sig in Nov 2015 with mild left-sided ulcerative colitis, biopsies revealing quiescent ulcerative colitis. She takes Lialda and Imuran, with some discussion of Remicade in the future. Would like to continue care with Dr. Benson Norway as an outpatient.   Presented to the ED due to severe abdominal pains, unlike anything she had experienced before. Felt like something was seriously wrong. Associated nausea, unable to eat or drink. Was having 4-5 loose stools per day. Baseline for patient has been loose stool. Placed on Viberzi not long ago. Would have a BM then followed by diarrhea. Chronic abdominal pain. No rectal bleeding but did notice mucus. +chills, mild fever right at 100 per patient. Yesterday didn't eat or drink anything. Pain lower abdomen. Started feeling bad about 2 weeks ago. Felt fatigued, no energy, didn't want to get out of bed. Nausea was the worst at the beginning. Has a history of Cdiff in Oct 2015. No stool studies ordered but patient has not had and BM since admission. Last stool on Sunday evening. No sick contacts. Takes Aleve sparingly. Has a small amount of queasiness.   Wants to advance diet. Clinically improved since admission and back to baseline of chronic abdominal discomfort.    Past Medical History  Diagnosis Date  . Neuropathy   . Fibroid   . Endometriosis   . Mucinous cystadenoma of ovary     left  . BRCA1 negative   . BRCA2 negative   . History of chemotherapy 01/2005  . Radiation 04/2005  . Hx of tamoxifen therapy   . Migraines     just at dx of breast CA  . Vitamin D deficiency   . Lymphedema of arm   .  Breast cancer 2006    chem/radiation/2years tamoxifen  . Stroke     related to when taken compazine  . Asthma   . Anxiety   . Depression   . Urinary tract infection     x 3  . Kidney stones   . GERD (gastroesophageal reflux disease)   . Peripheral neuropathy   . Arthritis     big toe   . Anemia 1998   . Hx of transfusion of packed red blood cells   . Vertigo 2014  . IBS (irritable bowel syndrome)   . Paresthesias   . Ulcerative colitis     with chronic diarrhea  . Chronic neck and back pain     Past Surgical History  Procedure Laterality Date  . Tonsillectomy    . Myomectomy    . Abdominal hysterectomy  2007    TAH/BSO  . Dilation and curettage of uterus    . Breast surgery  2006    LUMPECTOMY  . Cholecystectomy    . Cholecystectomy N/A 04/29/2014    Procedure: LAPAROSCOPIC CHOLECYSTECTOMY WITH INTRAOPERATIVE CHOLANGIOGRAM;  Surgeon: Edward Jolly, MD;  Location: [facility];  Service: General;  Laterality: N/A;  . Flexible sigmoidoscopy N/A 07/18/2014    Dr. Benson Norway: mild left-sided colitis, biopsies with quiescent UC  . Colonoscopy  June 2013    Dr. Benson Norway: sessile serrated adenoma    Prior to Admission medications   Medication  Sig Start Date End Date Taking? Authorizing Provider  acidophilus (RISAQUAD) CAPS capsule Take 1 capsule by mouth daily.   Yes Historical Provider, MD  albuterol (PROVENTIL HFA;VENTOLIN HFA) 108 (90 BASE) MCG/ACT inhaler Inhale 1-2 puffs into the lungs every 6 (six) hours as needed for wheezing. 05/08/13  Yes Janne Napoleon, NP  azaTHIOprine (IMURAN) 50 MG tablet Take 225 mg by mouth daily.    Yes Historical Provider, MD  beclomethasone (QVAR) 80 MCG/ACT inhaler Inhale 1 puff into the lungs daily as needed (Shortness of Breath).   Yes Historical Provider, MD  cetirizine (ZYRTEC) 10 MG tablet Take 10 mg by mouth daily.   Yes Historical Provider, MD  cyanocobalamin (,VITAMIN B-12,) 1000 MCG/ML injection Inject 1,000 mcg into the muscle every 30 (thirty)  days.   Yes Historical Provider, MD  dicyclomine (BENTYL) 20 MG tablet Take 20 mg by mouth 4 (four) times daily.   Yes Historical Provider, MD  diphenhydrAMINE (BENADRYL) 25 MG tablet Take 25 mg by mouth at bedtime as needed for sleep.   Yes Historical Provider, MD  Eluxadoline 75 MG TABS Take 1 tablet by mouth daily.   Yes Historical Provider, MD  gabapentin (NEURONTIN) 300 MG capsule Take 300 mg by mouth 2 (two) times daily.    Yes Historical Provider, MD  hyoscyamine (LEVSIN, ANASPAZ) 0.125 MG tablet Take 0.125 mg by mouth every 4 (four) hours as needed for cramping.   Yes Historical Provider, MD  Melatonin 5 MG CAPS Take 1 capsule by mouth daily as needed (sleep).    Yes Historical Provider, MD  mesalamine (LIALDA) 1.2 G EC tablet Take 2.4 g by mouth 2 (two) times daily.    Yes Historical Provider, MD  naproxen (NAPROSYN) 500 MG tablet Take 500 mg by mouth 3 (three) times daily as needed for moderate pain.   Yes Historical Provider, MD  ondansetron (ZOFRAN) 4 MG tablet Take 4 mg by mouth every 6 (six) hours as needed for nausea or vomiting.   Yes Historical Provider, MD  megestrol (MEGACE) 40 MG tablet Take 0.5 tablets (20 mg total) by mouth at bedtime. Patient not taking: Reported on 01/05/2015 03/24/13   Chauncey Cruel, MD  morphine (MSIR) 15 MG tablet Take 1 tablet (15 mg total) by mouth every 4 (four) hours as needed for severe pain. Patient not taking: Reported on 01/05/2015 04/29/14   Excell Seltzer, MD  pantoprazole (PROTONIX) 40 MG tablet Take 1 tablet (40 mg total) by mouth daily. Patient not taking: Reported on 01/05/2015 03/24/13   Chauncey Cruel, MD    Current Facility-Administered Medications  Medication Dose Route Frequency Provider Last Rate Last Dose  . 0.9 %  sodium chloride infusion   Intravenous Continuous Francine Graven, DO 100 mL/hr at 01/06/15 0546    . acidophilus (RISAQUAD) capsule 1 capsule  1 capsule Oral Daily Nimish Luther Parody, MD   1 capsule at 01/06/15 1013  .  albuterol (PROVENTIL) (2.5 MG/3ML) 0.083% nebulizer solution 3 mL  3 mL Inhalation Q6H PRN Nimish C Gosrani, MD      . azaTHIOprine (IMURAN) tablet 225 mg  225 mg Oral Daily Nimish C Gosrani, MD   225 mg at 01/06/15 1018  . ciprofloxacin (CIPRO) IVPB 400 mg  400 mg Intravenous Q12H Nimish C Anastasio Champion, MD   400 mg at 01/06/15 0546  . cyanocobalamin ((VITAMIN B-12)) injection 1,000 mcg  1,000 mcg Intramuscular Q30 days Doree Albee, MD   1,000 mcg at 01/06/15 1014  . dicyclomine (BENTYL)  capsule 20 mg  20 mg Oral QID Nimish C Anastasio Champion, MD   20 mg at 01/06/15 1013  . diphenhydrAMINE (BENADRYL) capsule 25 mg  25 mg Oral QHS PRN Orson Eva, MD      . Eluxadoline TABS 75 mg  75 mg Oral Daily Nimish C Anastasio Champion, MD   75 mg at 01/06/15 1000  . fluticasone (FLOVENT HFA) 44 MCG/ACT inhaler 1 puff  1 puff Inhalation BID Doree Albee, MD   1 puff at 01/06/15 0844  . gabapentin (NEURONTIN) capsule 300 mg  300 mg Oral BID Nimish C Gosrani, MD   300 mg at 01/06/15 1018  . heparin injection 5,000 Units  5,000 Units Subcutaneous 3 times per day Doree Albee, MD   5,000 Units at 01/06/15 0600  . hyoscyamine (LEVSIN, ANASPAZ) tablet 0.125 mg  0.125 mg Oral Q4H PRN Nimish C Gosrani, MD      . loratadine (CLARITIN) tablet 10 mg  10 mg Oral Daily Nimish C Gosrani, MD   10 mg at 01/06/15 1013  . mesalamine (LIALDA) EC tablet 2.4 g  2.4 g Oral BID Nimish C Gosrani, MD   2.4 g at 01/06/15 1014  . metroNIDAZOLE (FLAGYL) IVPB 500 mg  500 mg Intravenous Q8H Nimish C Gosrani, MD   500 mg at 01/06/15 1013  . naproxen (NAPROSYN) tablet 500 mg  500 mg Oral TID PRN Nimish Luther Parody, MD      . ondansetron (ZOFRAN) tablet 4 mg  4 mg Oral Q6H PRN Nimish C Gosrani, MD       Or  . ondansetron (ZOFRAN) injection 4 mg  4 mg Intravenous Q6H PRN Doree Albee, MD   4 mg at 01/06/15 0546    Allergies as of 01/05/2015 - Review Complete 01/05/2015  Allergen Reaction Noted  . Ampicillin Hives and Itching 10/05/2011  . Compazine  [prochlorperazine maleate] Other (See Comments) 10/05/2011  . Cymbalta [duloxetine hcl] Other (See Comments) 02/14/2013  . Imitrex [sumatriptan base] Hives and Nausea And Vomiting 10/05/2011  . Lyrica [pregabalin] Other (See Comments) 02/14/2013  . Metoclopramide hcl Hives 10/05/2011  . Percocet [oxycodone-acetaminophen] Itching 04/23/2014  . Effexor [venlafaxine hydrochloride] Hives and Palpitations 10/05/2011    Family History  Problem Relation Age of Onset  . Breast cancer Mother 37  . Heart failure Father   . Heart disease Father   . Cancer Maternal Grandmother     COLON  . Heart failure Paternal Grandmother   . Heart disease Paternal Grandmother     History   Social History  . Marital Status: Single    Spouse Name: N/A  . Number of Children: N/A  . Years of Education: N/A   Occupational History  .  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 Smoking status: Never Smoker   . Smokeless tobacco: Never Used  . Alcohol Use: No  . Drug Use: No  . Sexual Activity: No     Comment: HYST   Other Topics Concern  . Not on file   Social History Narrative   Patient lives at home with her nephew.    Patient has 2 years of college education.    Patient has 0 children.    Patient has a boyfriend.           Review of Systems: As mentioned in HPI  Physical Exam: Vital signs in last 24 hours: Temp:  [98.3 F (36.8 C)-98.9 F (37.2 C)] 98.8 F (  37.1 C) (05/03 0640) Pulse Rate:  [71-88] 77 (05/03 0640) Resp:  [16-20] 20 (05/03 0640) BP: (114-152)/(77-99) 147/79 mmHg (05/03 0640) SpO2:  [97 %-100 %] 97 % (05/03 0846) Weight:  [192 lb 3.9 oz (87.2 kg)-194 lb (87.998 kg)] 192 lb 3.9 oz (87.2 kg) (05/02 1900) Last BM Date: 01/04/15 General:   Alert,  Well-developed, well-nourished, pleasant and cooperative in NAD Head:  Normocephalic and atraumatic. Eyes:  Sclera clear, no icterus.   Conjunctiva pink. Ears:  Normal auditory acuity. Nose:  No  deformity, discharge,  or lesions. Mouth:  No deformity or lesions, dentition normal. Lungs:  Clear throughout to auscultation.   No wheezes, crackles, or rhonchi. No acute distress. Heart:  Regular rate and rhythm; no murmurs, clicks, rubs,  or gallops. Abdomen:  Soft, mild TTP lower abdomen and nondistended. No masses, hepatosplenomegaly or hernias noted. Normal bowel sounds, without guarding, and without rebound.   Rectal:  Deferred  Msk:  Symmetrical without gross deformities. Normal posture. Extremities:  Without clubbing or edema. Neurologic:  Alert and  oriented x4;  grossly normal neurologically. Skin:  Intact without significant lesions or rashes. Psych:  Alert and cooperative. Normal mood and affect.  Intake/Output from previous day: 05/02 0701 - 05/03 0700 In: 1766.7 [I.V.:1466.7; IV Piggyback:300] Out: -  Intake/Output this shift: Total I/O In: 360 [P.O.:360] Out: -   Lab Results:  Recent Labs  01/05/15 1316 01/06/15 0430  WBC 4.9 3.9*  HGB 13.9 12.5  HCT 42.6 38.6  PLT 178 166   BMET  Recent Labs  01/05/15 1316 01/06/15 0430  NA 140 138  K 3.9 3.6  CL 105 107  CO2 28 26  GLUCOSE 109* 90  BUN 10 9  CREATININE 0.86 0.86  CALCIUM 9.3 8.5*   LFT  Recent Labs  01/05/15 1316 01/06/15 0430  PROT 7.7 6.4*  ALBUMIN 3.8 3.1*  AST 24 19  ALT 23 19  ALKPHOS 92 84  BILITOT 0.6 0.5    Studies/Results: Ct Abdomen Pelvis W Contrast  01/05/2015   CLINICAL DATA:  LEFT abdominal and flank pain since 01/01/2015, past history of kidney stones, ulcerative colitis, RIGHT breast cancer post lumpectomy, radiation therapy and chemotherapy, endometriosis, asthma  EXAM: CT ABDOMEN AND PELVIS WITH CONTRAST  TECHNIQUE: Multidetector CT imaging of the abdomen and pelvis was performed using the standard protocol following bolus administration of intravenous contrast. Sagittal and coronal MPR images reconstructed from axial data set. Due to vomiting after initiation of  imaging, delayed imaging was performed throughout the entirety of the abdomen and pelvis.  CONTRAST:  65m OMNIPAQUE IOHEXOL 300 MG/ML SOLN orally, 1068mOMNIPAQUE IOHEXOL 300 MG/ML SOLN IV  COMPARISON:  02/01/2014  FINDINGS: Lung bases clear.  Small RIGHT renal cysts.  Liver, spleen, pancreas, kidneys and adrenal glands otherwise normal.  Gallbladder surgically absent.  Wall thickening of the cecum and terminal ileum consistent with an enteritis.  Question minimal rectal wall thickening versus artifact from underdistention.  Remainder of colon however is normal.  Stomach and remaining small bowel loops normal appearance.  Cluster of normal size lymph nodes in mesentery medial to cecum/ascending colon.  No mass, additional adenopathy, free fluid, or free air.  Uterus surgically absent with nonvisualization of ovaries and appendix.  Bulging versus herniated disc centrally at L4-L5, appearance unchanged.  No acute osseous findings.  IMPRESSION: Wall thickening of the cecum and terminal ileum compatible with enteritis, could represent inflammatory bowel disease or infection.  No evidence of perforation or abscess.  Clustered  lymph nodes in mesentery medial to the cecum, normal to upper normal in size.   Electronically Signed   By: Lavonia Dana M.D.   On: 01/05/2015 16:42    Impression: 49 year old female with history of left-sided ulcerative colitis, diagnosed in June of last year, presenting with several week history of acute on chronic abdominal pain, worsening frequency of stools but without rectal bleeding, and CT findings of enteritis. Clinically improved since admission with Cipro and Flagyl empirically. Stool studies unable to be obtained as diarrhea has ceased since admission. As of note, history of Cdiff in the past. Flex sig Nov 2015 by Dr. Benson Norway with quiescent left-sided colitis. As she has improved with supportive measures, would continue course of Cipro and Flagyl and obtain stool studies if recurrent  diarrhea. Continue Lialda and Imuran, add Zofran for supportive measures. Will advance to low residue diet.   Plan: Continue Cipro and Flagyl for 10 day course. Transition to po once tolerating diet Advance to low residue diet Zofran with meals Obtain stool studies if recurrent diarrhea Outpatient follow-up with Dr. Benson Norway for further management of chronic abdominal pain   Orvil Feil, ANP-BC [facility]      LOS: 1 day    01/06/2015, 10:53 AM

## 2015-01-06 NOTE — Care Management Note (Addendum)
Case Management Note  Patient Details  Name: Gabrielle Hawkins MRN: [id number] Date of Birth: 1965-11-06  Subjective/Objective:                  Pt admitted from home with enteritis. Pt lives with her mother and will return home at discharge. Pt is independent with ADL's.  Action/Plan: No CM needs noted.  Expected Discharge Date:  01/07/15               Expected Discharge Plan:  Home/Self Care  In-House Referral:  NA  Discharge planning Services  CM Consult  Post Acute Care Choice:  NA Choice offered to:  NA  DME Arranged:    DME Agency:     HH Arranged:    HH Agency:     Status of Service:  Completed, signed off  Medicare Important Message Given:    Date Medicare IM Given:    Medicare IM give by:    Date Additional Medicare IM Given:    Additional Medicare Important Message give by:     If discussed at [place] of Stay Meetings, dates discussed:    Additional Comments: Pt discharged home today. No CM needs noted. Christinia Gully [place], RN 01/06/2015, 3:00 PM

## 2015-01-06 NOTE — Progress Notes (Signed)
PROGRESS NOTE  Gabrielle Hawkins ZSW:[id number] DOB: 13-Sep-1965 DOA: 01/05/2015 PCP: No primary care provider on file.  Brief history 49 year old female with a history of ulcerative colitis (June 2015), breast cancer, and anxiety presented with 5-6 day history of increasing lower abdominal pain. The patient states that she normally has 3-4 loose bowel movements daily, and that has not changed.  She normally has abdominal pain, but she states this episode was worse than usual.   She has temperatures up to 100.6F at home. She denies any hematemesis, hematochezia, melena, chest pain, shortness of breath, dysuria, hematuria. The patient endorses compliance with all her medications. She states that she has been on Viberzi for about 2 months. CT of abdomen and pelvis at the time of admission revealed wall thickening of the cecum and terminal ileum. There was a questionable rectal wall thickening versus poor oral contrast. The patient had sigmoidoscopy on 07/18/2014 performed by Dr. Benson Norway which revealed colitis and ulcerative colitis Assessment/Plan: Ulcerative Colitis with abnormal CT abdomen  -?exacerbation vs medication effect (Viberzi) -01/05/2015 CT abdomen and pelvis shows wall thickening of the cecum and terminal ileum -some clinical improvement with IV cipro and flagyl -continue IVF -to po abx once tolerating po -diet advancement per GI -continue Lialda and Imuran Diarrhea -unable to obtain stool studies as pt has not had BM since admission -last BM 01/04/15 History of breast CA -finished tamoxifen Asthma -stable -continue inhaled corticosteroid Bacteriuria -The patient does not have any significant pyuria nor any symptoms to suggest UTI   Family Communication:   Pt at beside Disposition Plan:   Home 5/4 if stable       Procedures/Studies: Ct Abdomen Pelvis W Contrast  01/05/2015   CLINICAL DATA:  LEFT abdominal and flank pain since 01/01/2015, past history of kidney  stones, ulcerative colitis, RIGHT breast cancer post lumpectomy, radiation therapy and chemotherapy, endometriosis, asthma  EXAM: CT ABDOMEN AND PELVIS WITH CONTRAST  TECHNIQUE: Multidetector CT imaging of the abdomen and pelvis was performed using the standard protocol following bolus administration of intravenous contrast. Sagittal and coronal MPR images reconstructed from axial data set. Due to vomiting after initiation of imaging, delayed imaging was performed throughout the entirety of the abdomen and pelvis.  CONTRAST:  82m OMNIPAQUE IOHEXOL 300 MG/ML SOLN orally, 1068mOMNIPAQUE IOHEXOL 300 MG/ML SOLN IV  COMPARISON:  02/01/2014  FINDINGS: Lung bases clear.  Small RIGHT renal cysts.  Liver, spleen, pancreas, kidneys and adrenal glands otherwise normal.  Gallbladder surgically absent.  Wall thickening of the cecum and terminal ileum consistent with an enteritis.  Question minimal rectal wall thickening versus artifact from underdistention.  Remainder of colon however is normal.  Stomach and remaining small bowel loops normal appearance.  Cluster of normal size lymph nodes in mesentery medial to cecum/ascending colon.  No mass, additional adenopathy, free fluid, or free air.  Uterus surgically absent with nonvisualization of ovaries and appendix.  Bulging versus herniated disc centrally at L4-L5, appearance unchanged.  No acute osseous findings.  IMPRESSION: Wall thickening of the cecum and terminal ileum compatible with enteritis, could represent inflammatory bowel disease or infection.  No evidence of perforation or abscess.  Clustered lymph nodes in mesentery medial to the cecum, normal to upper normal in size.   Electronically Signed   By: MaLavonia Dana.D.   On: 01/05/2015 16:42         Subjective: Patient says that her abdominal pain is a little better  than yesterday. She has not had a bowel movement since 01/05/2015. Denies fevers, chills, chest pain, shortness of breath, vomiting, diarrhea,  dysuria, hematuria. No fevers or chills  Objective: Filed Vitals:   01/05/15 2252 01/06/15 0640 01/06/15 0846 01/06/15 1330  BP:  147/79  135/91  Pulse:  77  80  Temp:  98.8 F (37.1 C)  98.7 F (37.1 C)  TempSrc:  Oral  Oral  Resp:  20  20  Height:      Weight:      SpO2: 97% 97% 97% 100%    Intake/Output Summary (Last 24 hours) at 01/06/15 1401 Last data filed at 01/06/15 1248  Gross per 24 hour  Intake 2546.66 ml  Output      0 ml  Net 2546.66 ml   Weight change:  Exam:   General:  Pt is alert, follows commands appropriately, not in acute distress  HEENT: No icterus, No thrush, [place]/AT  Cardiovascular: RRR, S1/S2, no rubs, no gallops  Respiratory: CTA bilaterally, no wheezing, no crackles, no rhonchi  Abdomen: Soft/+BS, LLQ and RLQ tender without rebound, non distended, no guarding  Extremities: No edema, No lymphangitis, No petechiae, No rashes, no synovitis  Data Reviewed: Basic Metabolic Panel:  Recent Labs Lab 01/05/15 1316 01/06/15 0430  NA 140 138  K 3.9 3.6  CL 105 107  CO2 28 26  GLUCOSE 109* 90  BUN 10 9  CREATININE 0.86 0.86  CALCIUM 9.3 8.5*   Liver Function Tests:  Recent Labs Lab 01/05/15 1316 01/06/15 0430  AST 24 19  ALT 23 19  ALKPHOS 92 84  BILITOT 0.6 0.5  PROT 7.7 6.4*  ALBUMIN 3.8 3.1*    Recent Labs Lab 01/05/15 1316  LIPASE 23   No results for input(s): AMMONIA in the last 168 hours. CBC:  Recent Labs Lab 01/05/15 1316 01/06/15 0430  WBC 4.9 3.9*  NEUTROABS 2.7  --   HGB 13.9 12.5  HCT 42.6 38.6  MCV 88.9 87.7  PLT 178 166   Cardiac Enzymes: No results for input(s): CKTOTAL, CKMB, CKMBINDEX, TROPONINI in the last 168 hours. BNP: Invalid input(s): POCBNP CBG: No results for input(s): GLUCAP in the last 168 hours.  No results found for this or any previous visit (from the past 240 hour(s)).   Scheduled Meds: . acidophilus  1 capsule Oral Daily  . azaTHIOprine  225 mg Oral Daily  . ciprofloxacin   400 mg Intravenous Q12H  . cyanocobalamin  1,000 mcg Intramuscular Q30 days  . dicyclomine  20 mg Oral QID  . Eluxadoline  75 mg Oral Daily  . fluticasone  1 puff Inhalation BID  . gabapentin  300 mg Oral BID  . heparin  5,000 Units Subcutaneous 3 times per day  . loratadine  10 mg Oral Daily  . mesalamine  2.4 g Oral BID  . metronidazole  500 mg Intravenous Q8H  . ondansetron  4 mg Oral TID WC & HS   Continuous Infusions: . sodium chloride 100 mL/hr at 01/06/15 0546     Auri Jahnke, DO  [facility] Pager [phone]  If 7PM-7AM, please contact night-coverage [email or web address] Password [email or web address] 01/06/2015, 2:01 PM   LOS: 1 day

## 2015-01-06 NOTE — Progress Notes (Signed)
UR chart review completed.  

## 2015-01-07 DIAGNOSIS — R112 Nausea with vomiting, unspecified: Secondary | ICD-10-CM

## 2015-01-07 DIAGNOSIS — G8929 Other chronic pain: Secondary | ICD-10-CM

## 2015-01-07 DIAGNOSIS — R1031 Right lower quadrant pain: Secondary | ICD-10-CM

## 2015-01-07 MED ORDER — CIPROFLOXACIN HCL 500 MG PO TABS: 500.0000 mg | ORAL_TABLET | Freq: Two times a day (BID) | ORAL | Status: DC

## 2015-01-07 MED ORDER — METRONIDAZOLE 500 MG PO TABS: 500.0000 mg | ORAL_TABLET | Freq: Three times a day (TID) | ORAL | Status: DC

## 2015-01-07 NOTE — Discharge Summary (Signed)
Physician Discharge Summary  Gabrielle Hawkins ZSW:[id number] DOB: [birth year] DOA: 01/05/2015  PCP: No primary care provider on file.  Admit date: 01/05/2015 Discharge date: 01/07/2015  Recommendations for Outpatient Follow-up:  1. Complete a ten-day course of oral antibiotics 2. Follow-up with gastroenterology in approximately 2-3 weeks  Discharge Diagnoses:  Active Problems:   Nausea and vomiting   Ulcerative colitis   Abdominal pain, chronic, right lower quadrant   Discharge Condition: Stable, improved  Diet recommendation: Low residue  Wt Readings from Last 3 Encounters:  01/05/15 87.2 kg (192 lb 3.9 oz)  04/30/14 89.359 kg (197 lb)  04/29/14 88.451 kg (195 lb)    History of present illness/brief summary:   49 year old female with a history of ulcerative colitis (June 2015), breast cancer, and anxiety presented with 5-6 day history of increasing lower abdominal pain. The patient stated that she has continued to have her normal 3-4 loose bowel movements daily however, she had had worsening abdominal pain and low grade fevers to 100.31F at home. She denied hematemesis, hematochezia, melena, dysuria, hematuria. The patient endorsed compliance with all her medications. She stated that she has been on Viberzi for about 2 months. CT of abdomen and pelvis at the time of admission revealed wall thickening of the cecum and terminal ileum. There was a questionable rectal wall thickening versus poor oral contrast. The patient had sigmoidoscopy on 07/18/2014 performed by Dr. Benson Norway which revealed colitis and ulcerative colitis.  She was started on IV ciprofloxacin and Flagyl and IV fluids. She was given bowel rest and seen by gastroenterology. She had quick clinical improvement and has not had recent diarrhea. Her abdominal pain has improved. She will follow-up with Dr. Benson Norway in approximately 2-3 weeks.  Hospital Course:   Ulcerative Colitis with abnormal CT abdomen  -?exacerbation vs medication  effect (Viberzi) -01/05/2015 CT abdomen and pelvis shows wall thickening of the cecum and terminal ileum -Clinical improvement with IV cipro and flagyl - Transition to oral ciprofloxacin and Flagyl at discharge (she has penicillin allergy) - Continue low residue diet until she follows up with Dr. Benson Norway -continued Lialda and Imuran  Diarrhea -unable to obtain stool studies as pt did not have BM since admission -last BM 01/04/15  History of breast CA -finished tamoxifen  Asthma -stable -continued inhaled corticosteroid  Bacteriuria -The patient did not have any significant pyuria nor any symptoms to suggest UTI -  Urine culture no growth final  Procedures:  CT scan abdomen and pelvis on 01/05/2015  Consultations:  Gastroenterology, NP Laban Emperor supervised by Barney Drain, MD  Discharge Exam: Filed Vitals:   01/07/15 0554  BP: 148/71  Pulse: 73  Temp: 98.3 F (36.8 C)  Resp: 20   Filed Vitals:   01/06/15 1940 01/06/15 2052 01/07/15 0554 01/07/15 0741  BP:  111/61 148/71   Pulse:  95 73   Temp:  98.8 F (37.1 C) 98.3 F (36.8 C)   TempSrc:  Oral Oral   Resp:  20 20   Height:      Weight:      SpO2: 97% 98% 98% 98%  Body mass index is 29.24 kg/(m^2).   General: Overweight female, no acute distress Cardiovascular: Regular rate and rhythm, no murmurs, rubs, or gallops, warm extremities, 2+ pulses Respiratory: Clear to auscultation bilaterally, no increased work of breathing Abdomen: NABS, soft, nondistended, minimally tender to palpation in the right lower quadrant where she has had her heparin injections, no rebound or guarding MSK: Normal tone and bulk, no  lower extremity edema  Discharge Instructions      Discharge Instructions    Call MD for:  difficulty breathing, headache or visual disturbances    Complete by:  As directed      Call MD for:  extreme fatigue    Complete by:  As directed      Call MD for:  hives    Complete by:  As directed      Call MD  for:  persistant dizziness or light-headedness    Complete by:  As directed      Call MD for:  persistant nausea and vomiting    Complete by:  As directed      Call MD for:  severe uncontrolled pain    Complete by:  As directed      Call MD for:  temperature >100.4    Complete by:  As directed      Discharge instructions    Complete by:  As directed   Please eat a low residue diet and continue your antibiotics until 5/11, then you should be complete.  Please make sure you follow up with Dr. Benson Norway in about 2-3 weeks.     Increase activity slowly    Complete by:  As directed             Medication List    STOP taking these medications        diphenhydrAMINE 25 MG tablet  Commonly known as:  BENADRYL     megestrol 40 MG tablet  Commonly known as:  MEGACE     morphine 15 MG tablet  Commonly known as:  MSIR     pantoprazole 40 MG tablet  Commonly known as:  PROTONIX      TAKE these medications        acidophilus Caps capsule  Take 1 capsule by mouth daily.     albuterol 108 (90 BASE) MCG/ACT inhaler  Commonly known as:  PROVENTIL HFA;VENTOLIN HFA  Inhale 1-2 puffs into the lungs every 6 (six) hours as needed for wheezing.     azaTHIOprine 50 MG tablet  Commonly known as:  IMURAN  Take 225 mg by mouth daily.     beclomethasone 80 MCG/ACT inhaler  Commonly known as:  QVAR  Inhale 1 puff into the lungs daily as needed (Shortness of Breath).     cetirizine 10 MG tablet  Commonly known as:  ZYRTEC  Take 10 mg by mouth daily.     ciprofloxacin 500 MG tablet  Commonly known as:  CIPRO  Take 1 tablet (500 mg total) by mouth 2 (two) times daily.     cyanocobalamin 1000 MCG/ML injection  Commonly known as:  (VITAMIN B-12)  Inject 1,000 mcg into the muscle every 30 (thirty) days.     dicyclomine 20 MG tablet  Commonly known as:  BENTYL  Take 20 mg by mouth 4 (four) times daily.     Eluxadoline 75 MG Tabs  Take 1 tablet by mouth daily.     gabapentin 300 MG capsule   Commonly known as:  NEURONTIN  Take 300 mg by mouth 2 (two) times daily.     hyoscyamine 0.125 MG tablet  Commonly known as:  LEVSIN, ANASPAZ  Take 0.125 mg by mouth every 4 (four) hours as needed for cramping.     Melatonin 5 MG Caps  Take 1 capsule by mouth daily as needed (sleep).     mesalamine 1.2 G EC tablet  Commonly known as:  LIALDA  Take 2.4 g by mouth 2 (two) times daily.     metroNIDAZOLE 500 MG tablet  Commonly known as:  FLAGYL  Take 1 tablet (500 mg total) by mouth 3 (three) times daily.     naproxen 500 MG tablet  Commonly known as:  NAPROSYN  Take 500 mg by mouth 3 (three) times daily as needed for moderate pain.     ondansetron 4 MG tablet  Commonly known as:  ZOFRAN  Take 4 mg by mouth every 6 (six) hours as needed for nausea or vomiting.       Follow-up Information    Follow up with HUNG,PATRICK D, MD. Schedule an appointment as soon as possible for a visit in 2 weeks.   Specialty:  Gastroenterology   Contact information:   [address] [place] [address] [phone]        The results of significant diagnostics from this hospitalization (including imaging, microbiology, ancillary and laboratory) are listed below for reference.    Significant Diagnostic Studies: Ct Abdomen Pelvis W Contrast  01/05/2015   CLINICAL DATA:  LEFT abdominal and flank pain since 01/01/2015, past history of kidney stones, ulcerative colitis, RIGHT breast cancer post lumpectomy, radiation therapy and chemotherapy, endometriosis, asthma  EXAM: CT ABDOMEN AND PELVIS WITH CONTRAST  TECHNIQUE: Multidetector CT imaging of the abdomen and pelvis was performed using the standard protocol following bolus administration of intravenous contrast. Sagittal and coronal MPR images reconstructed from axial data set. Due to vomiting after initiation of imaging, delayed imaging was performed throughout the entirety of the abdomen and pelvis.  CONTRAST:  83m OMNIPAQUE  IOHEXOL 300 MG/ML SOLN orally, 1061mOMNIPAQUE IOHEXOL 300 MG/ML SOLN IV  COMPARISON:  02/01/2014  FINDINGS: Lung bases clear.  Small RIGHT renal cysts.  Liver, spleen, pancreas, kidneys and adrenal glands otherwise normal.  Gallbladder surgically absent.  Wall thickening of the cecum and terminal ileum consistent with an enteritis.  Question minimal rectal wall thickening versus artifact from underdistention.  Remainder of colon however is normal.  Stomach and remaining small bowel loops normal appearance.  Cluster of normal size lymph nodes in mesentery medial to cecum/ascending colon.  No mass, additional adenopathy, free fluid, or free air.  Uterus surgically absent with nonvisualization of ovaries and appendix.  Bulging versus herniated disc centrally at L4-L5, appearance unchanged.  No acute osseous findings.  IMPRESSION: Wall thickening of the cecum and terminal ileum compatible with enteritis, could represent inflammatory bowel disease or infection.  No evidence of perforation or abscess.  Clustered lymph nodes in mesentery medial to the cecum, normal to upper normal in size.   Electronically Signed   By: MaLavonia Dana.D.   On: 01/05/2015 16:42    Microbiology: Recent Results (from the past 240 hour(s))  Urine culture     Status: None   Collection Time: 01/05/15  1:00 PM  Result Value Ref Range Status   Specimen Description URINE, CLEAN CATCH  Final   Special Requests NONE  Final   Colony Count NO GROWTH Performed at [facility] Final   Culture NO GROWTH Performed at [facility] Final   Report Status 01/06/2015 FINAL  Final     Labs: Basic Metabolic Panel:  Recent Labs Lab 01/05/15 1316 01/06/15 0430  NA 140 138  K 3.9 3.6  CL 105 107  CO2 28 26  GLUCOSE 109* 90  BUN 10 9  CREATININE 0.86 0.86  CALCIUM 9.3 8.5*  Liver Function Tests:  Recent Labs Lab 01/05/15 1316 01/06/15 0430  AST 24 19  ALT 23 19  ALKPHOS 92 84  BILITOT 0.6 0.5  PROT 7.7  6.4*  ALBUMIN 3.8 3.1*    Recent Labs Lab 01/05/15 1316  LIPASE 23   No results for input(s): AMMONIA in the last 168 hours. CBC:  Recent Labs Lab 01/05/15 1316 01/06/15 0430  WBC 4.9 3.9*  NEUTROABS 2.7  --   HGB 13.9 12.5  HCT 42.6 38.6  MCV 88.9 87.7  PLT 178 166   Cardiac Enzymes: No results for input(s): CKTOTAL, CKMB, CKMBINDEX, TROPONINI in the last 168 hours. BNP: BNP (last 3 results) No results for input(s): BNP in the last 8760 hours.  ProBNP (last 3 results) No results for input(s): PROBNP in the last 8760 hours.  CBG: No results for input(s): GLUCAP in the last 168 hours.  Time coordinating discharge: 35 minutes  Signed:  Tennessee Hanlon  [facility] 01/07/2015, 8:25 AM

## 2015-01-07 NOTE — Progress Notes (Signed)
Patient with orders to be discharge home. Discharge instructions given, patient verbalized understanding. Prescriptions given. Patient stable. Patient left in private vehicle with family.

## 2015-01-07 NOTE — Progress Notes (Signed)
Briefly spoke with patient this morning, as she is being discharged. She will contact Dr. Ulyses Amor office to arrange a routine follow-up appointment.  Orvil Feil, ANP-BC [facility]

## 2015-01-23 ENCOUNTER — Telehealth: Payer: Self-pay | Admitting: Neurology

## 2015-01-23 NOTE — Telephone Encounter (Signed)
Pt called earlier in the week to schedule a one year follow up appointment. Per Dr. Carles Collet, she has not seen this patient in nearly 2 years. As documented in EPIC, the patient transferred care to [facility]. Dr. Carles Collet no longer treats patients for peripheral neuropathy as she is strictly movement disorders at this point. Per Dr. Carles Collet, Gabrielle Hawkins will need to contact [facility] to schedule her follow up appointment. i tried to call the patient at the number we have in EPIC, it is a non-working number. I have sent a MyChart message to the patient requesting she call me to discuss further. i am awaiting a call. If she hasn't called me back by Monday, I will try to reach her at her emergency contact numbers / [name]

## 2015-01-26 NOTE — Telephone Encounter (Signed)
Pt called this morning and cancelled appt. / [name]

## 2015-01-27 ENCOUNTER — Ambulatory Visit: Payer: 59 | Admitting: Neurology

## 2015-02-03 ENCOUNTER — Encounter (HOSPITAL_COMMUNITY): Payer: Self-pay | Admitting: Emergency Medicine

## 2015-02-03 ENCOUNTER — Emergency Department (HOSPITAL_COMMUNITY)
Admission: EM | Admit: 2015-02-03 | Discharge: 2015-02-03 | Disposition: A | Discharge status: Home / Self Care | Payer: BLUE CROSS/BLUE SHIELD | Attending: Emergency Medicine | Admitting: Emergency Medicine

## 2015-02-03 DIAGNOSIS — Z87442 Personal history of urinary calculi: Secondary | ICD-10-CM | POA: Diagnosis not present

## 2015-02-03 DIAGNOSIS — Z8639 Personal history of other endocrine, nutritional and metabolic disease: Secondary | ICD-10-CM | POA: Diagnosis not present

## 2015-02-03 DIAGNOSIS — Z8659 Personal history of other mental and behavioral disorders: Secondary | ICD-10-CM | POA: Insufficient documentation

## 2015-02-03 DIAGNOSIS — R109 Unspecified abdominal pain: Secondary | ICD-10-CM

## 2015-02-03 DIAGNOSIS — G629 Polyneuropathy, unspecified: Secondary | ICD-10-CM | POA: Diagnosis not present

## 2015-02-03 DIAGNOSIS — J45909 Unspecified asthma, uncomplicated: Secondary | ICD-10-CM | POA: Diagnosis not present

## 2015-02-03 DIAGNOSIS — Z79899 Other long term (current) drug therapy: Secondary | ICD-10-CM | POA: Diagnosis not present

## 2015-02-03 DIAGNOSIS — R1030 Lower abdominal pain, unspecified: Secondary | ICD-10-CM | POA: Diagnosis present

## 2015-02-03 DIAGNOSIS — Z8673 Personal history of transient ischemic attack (TIA), and cerebral infarction without residual deficits: Secondary | ICD-10-CM | POA: Diagnosis not present

## 2015-02-03 DIAGNOSIS — K5190 Ulcerative colitis, unspecified, without complications: Secondary | ICD-10-CM | POA: Insufficient documentation

## 2015-02-03 DIAGNOSIS — Z862 Personal history of diseases of the blood and blood-forming organs and certain disorders involving the immune mechanism: Secondary | ICD-10-CM | POA: Diagnosis not present

## 2015-02-03 DIAGNOSIS — G43909 Migraine, unspecified, not intractable, without status migrainosus: Secondary | ICD-10-CM | POA: Insufficient documentation

## 2015-02-03 DIAGNOSIS — Z86018 Personal history of other benign neoplasm: Secondary | ICD-10-CM | POA: Diagnosis not present

## 2015-02-03 DIAGNOSIS — Z3202 Encounter for pregnancy test, result negative: Secondary | ICD-10-CM | POA: Insufficient documentation

## 2015-02-03 DIAGNOSIS — Z853 Personal history of malignant neoplasm of breast: Secondary | ICD-10-CM | POA: Insufficient documentation

## 2015-02-03 DIAGNOSIS — Z87440 Personal history of urinary (tract) infections: Secondary | ICD-10-CM | POA: Insufficient documentation

## 2015-02-03 DIAGNOSIS — M1990 Unspecified osteoarthritis, unspecified site: Secondary | ICD-10-CM | POA: Diagnosis not present

## 2015-02-03 DIAGNOSIS — K51919 Ulcerative colitis, unspecified with unspecified complications: Secondary | ICD-10-CM

## 2015-02-03 LAB — CBC
HCT: 43 % (ref 36.0–46.0)
Hemoglobin: 14 g/dL (ref 12.0–15.0)
MCH: 29.4 pg (ref 26.0–34.0)
MCHC: 32.6 g/dL (ref 30.0–36.0)
MCV: 90.1 fL (ref 78.0–100.0)
Platelets: 197 10*3/uL (ref 150–400)
RBC: 4.77 MIL/uL (ref 3.87–5.11)
RDW: 13.7 % (ref 11.5–15.5)
WBC: 5 10*3/uL (ref 4.0–10.5)

## 2015-02-03 LAB — COMPREHENSIVE METABOLIC PANEL
ALT: 29 U/L (ref 14–54)
AST: 29 U/L (ref 15–41)
Albumin: 3.9 g/dL (ref 3.5–5.0)
Alkaline Phosphatase: 90 U/L (ref 38–126)
Anion gap: 8 (ref 5–15)
BILIRUBIN TOTAL: 0.6 mg/dL (ref 0.3–1.2)
BUN: 16 mg/dL (ref 6–20)
CO2: 29 mmol/L (ref 22–32)
Calcium: 9.5 mg/dL (ref 8.9–10.3)
Chloride: 104 mmol/L (ref 101–111)
Creatinine, Ser: 0.93 mg/dL (ref 0.44–1.00)
GFR calc Af Amer: >60 mL/min (ref >60)
GFR calc non Af Amer: >60 mL/min (ref >60)
Glucose, Bld: 101 mg/dL — ABNORMAL HIGH (ref 65–99)
POTASSIUM: 4 mmol/L (ref 3.5–5.1)
Sodium: 141 mmol/L (ref 135–145)
Total Protein: 7.5 g/dL (ref 6.5–8.1)

## 2015-02-03 LAB — URINALYSIS, ROUTINE W REFLEX MICROSCOPIC
BILIRUBIN URINE: NEGATIVE
Glucose, UA: NEGATIVE mg/dL
HGB URINE DIPSTICK: NEGATIVE
KETONES UR: NEGATIVE mg/dL
Leukocytes, UA: NEGATIVE
Nitrite: NEGATIVE
Protein, ur: NEGATIVE mg/dL
Specific Gravity, Urine: >1.03 — ABNORMAL HIGH (ref 1.005–1.030)
UROBILINOGEN UA: 0.2 mg/dL (ref 0.0–1.0)
pH: 5.5 (ref 5.0–8.0)

## 2015-02-03 LAB — PREGNANCY, URINE: Preg Test, Ur: NEGATIVE

## 2015-02-03 MED ORDER — HYDROMORPHONE HCL 1 MG/ML IJ SOLN
1.0000 mg | Freq: Once | INTRAMUSCULAR | Status: AC
  Administered 2015-02-03: 1 mg via INTRAVENOUS
  Filled 2015-02-03: qty 1

## 2015-02-03 MED ORDER — ONDANSETRON HCL 4 MG/2ML IJ SOLN
4.0000 mg | Freq: Once | INTRAMUSCULAR | Status: AC
  Administered 2015-02-03: 4 mg via INTRAVENOUS
  Filled 2015-02-03: qty 2

## 2015-02-03 MED ORDER — CIPROFLOXACIN HCL 500 MG PO TABS: 500.0000 mg | ORAL_TABLET | Freq: Two times a day (BID) | ORAL | Status: DC

## 2015-02-03 MED ORDER — SODIUM CHLORIDE 0.9 % IV BOLUS (SEPSIS)
1000.0000 mL | Freq: Once | INTRAVENOUS | Status: AC
  Administered 2015-02-03: 1000 mL via INTRAVENOUS

## 2015-02-03 MED ORDER — DIPHENHYDRAMINE HCL 50 MG/ML IJ SOLN
25.0000 mg | Freq: Once | INTRAMUSCULAR | Status: AC
  Administered 2015-02-03: 25 mg via INTRAVENOUS
  Filled 2015-02-03: qty 1

## 2015-02-03 NOTE — ED Notes (Signed)
Hx ulcerative colitis, on set Saturday, diarrhea,  And worsen pain,denies bloody stools,

## 2015-02-03 NOTE — ED Notes (Signed)
EDP at bedside  

## 2015-02-03 NOTE — ED Notes (Signed)
Called to pt's room for possible allergic reaction, pt states she is itching all over. EDP made aware.

## 2015-02-03 NOTE — ED Provider Notes (Addendum)
CSN: [id number]     Arrival date & time 02/03/15  1336 History   First MD Initiated Contact with Patient 02/03/15 1429     Chief Complaint  Patient presents with  . Abdominal Pain     (Consider location/radiation/quality/duration/timing/severity/associated sxs/prior Treatment) Patient is a 49 y.o. female presenting with abdominal pain. The history is provided by the patient.  Abdominal Pain Associated symptoms: diarrhea   Associated symptoms: no chest pain, no chills, no cough, no dysuria, no fever, no shortness of breath, no sore throat, no vaginal bleeding, no vaginal discharge and no vomiting   Patient w hx UC, IBS, c/o lower abdominal pain for the past few days. Cramping pain, dull, moderate, comes and goes. No specific exacerbating or alleviating factors. Feels similar to symptoms/pain w UC in past. States at baseline has 2 stools per day, loose, in past couple days mildly more frequent at 4-5 stools per day. No severe diarrhea. No bloody bms. Denies abd distension. No nv. No fever or chills. States compliant w normal meds, including Lialda and Imuran.  States last year was on prednisone for 8 months out of the year, but denies current/recent pred use. Last on abx, cipro and flagyl earlier this month, no current abx. No dysuria or gu c/o. No vaginal discharge or bleeding, having regular periods.       Past Medical History  Diagnosis Date  . Neuropathy   . Fibroid   . Endometriosis   . Mucinous cystadenoma of ovary     left  . BRCA1 negative   . BRCA2 negative   . History of chemotherapy 01/2005  . Radiation 04/2005  . Hx of tamoxifen therapy   . Migraines     just at dx of breast CA  . Vitamin D deficiency   . Lymphedema of arm   . Breast cancer 2006    chem/radiation/2years tamoxifen  . Stroke     related to when taken compazine  . Asthma   . Anxiety   . Depression   . Urinary tract infection     x 3  . Kidney stones   . GERD (gastroesophageal reflux disease)   .  Peripheral neuropathy   . Arthritis     big toe   . Anemia 1998   . Hx of transfusion of packed red blood cells   . Vertigo 2014  . IBS (irritable bowel syndrome)   . Paresthesias   . Ulcerative colitis     with chronic diarrhea  . Chronic neck and back pain    Past Surgical History  Procedure Laterality Date  . Tonsillectomy    . Myomectomy    . Abdominal hysterectomy  2007    TAH/BSO  . Dilation and curettage of uterus    . Breast surgery  2006    LUMPECTOMY  . Cholecystectomy    . Cholecystectomy N/A 04/29/2014    Procedure: LAPAROSCOPIC CHOLECYSTECTOMY WITH INTRAOPERATIVE CHOLANGIOGRAM;  Surgeon: Edward Jolly, MD;  Location: [facility];  Service: General;  Laterality: N/A;  . Flexible sigmoidoscopy N/A 07/18/2014    Dr. Benson Norway: mild left-sided colitis, biopsies with quiescent UC  . Colonoscopy  June 2013    Dr. Benson Norway: sessile serrated adenoma   Family History  Problem Relation Age of Onset  . Breast cancer Mother 16  . Heart failure Father   . Heart disease Father   . Cancer Maternal Grandmother     COLON  . Heart failure Paternal Grandmother   . Heart disease  Paternal Grandmother    History  Substance Use Topics  . Smoking status: Never Smoker   . Smokeless tobacco: Never Used  . Alcohol Use: No   OB History    Gravida Para Term Preterm AB TAB SAB Ectopic Multiple Living   1 0   1  1   0     Review of Systems  Constitutional: Negative for fever and chills.  HENT: Negative for sore throat.   Eyes: Negative for redness.  Respiratory: Negative for cough and shortness of breath.   Cardiovascular: Negative for chest pain.  Gastrointestinal: Positive for abdominal pain and diarrhea. Negative for vomiting.  Endocrine: Negative for polyuria.  Genitourinary: Negative for dysuria, flank pain, vaginal bleeding and vaginal discharge.  Musculoskeletal: Negative for back pain and neck pain.  Skin: Negative for rash.  Neurological: Negative for headaches.   Hematological: Does not bruise/bleed easily.  Psychiatric/Behavioral: Negative for confusion.      Allergies  Ampicillin; Compazine; Cymbalta; Imitrex; Lyrica; Metoclopramide hcl; Percocet; and Effexor  Home Medications   Prior to Admission medications   Medication Sig Start Date End Date Taking? Authorizing Provider  acetaminophen (TYLENOL) 500 MG tablet Take 1,000 mg by mouth every 6 (six) hours as needed.   Yes Historical Provider, MD  acidophilus (RISAQUAD) CAPS capsule Take 1 capsule by mouth daily.   Yes Historical Provider, MD  albuterol (PROVENTIL HFA;VENTOLIN HFA) 108 (90 BASE) MCG/ACT inhaler Inhale 1-2 puffs into the lungs every 6 (six) hours as needed for wheezing. 05/08/13  Yes Janne Napoleon, NP  azaTHIOprine (IMURAN) 50 MG tablet Take 225 mg by mouth daily.    Yes Historical Provider, MD  beclomethasone (QVAR) 80 MCG/ACT inhaler Inhale 1 puff into the lungs daily as needed (Shortness of Breath).   Yes Historical Provider, MD  cetirizine (ZYRTEC) 10 MG tablet Take 10 mg by mouth daily.   Yes Historical Provider, MD  cyanocobalamin (,VITAMIN B-12,) 1000 MCG/ML injection Inject 1,000 mcg into the muscle every 30 (thirty) days.   Yes Historical Provider, MD  dicyclomine (BENTYL) 20 MG tablet Take 20 mg by mouth 4 (four) times daily.   Yes Historical Provider, MD  Eluxadoline 75 MG TABS Take 1 tablet by mouth daily.   Yes Historical Provider, MD  gabapentin (NEURONTIN) 300 MG capsule Take 300 mg by mouth 2 (two) times daily.    Yes Historical Provider, MD  hyoscyamine (LEVSIN, ANASPAZ) 0.125 MG tablet Take 0.125 mg by mouth every 4 (four) hours as needed for cramping.   Yes Historical Provider, MD  Melatonin 5 MG CAPS Take 1 capsule by mouth daily as needed (sleep).    Yes Historical Provider, MD  mesalamine (LIALDA) 1.2 G EC tablet Take 2.4 g by mouth 2 (two) times daily.    Yes Historical Provider, MD  naproxen (NAPROSYN) 500 MG tablet Take 500 mg by mouth 3 (three) times daily as  needed for moderate pain.   Yes Historical Provider, MD  ondansetron (ZOFRAN) 4 MG tablet Take 4 mg by mouth every 6 (six) hours as needed for nausea or vomiting.   Yes Historical Provider, MD  ciprofloxacin (CIPRO) 500 MG tablet Take 1 tablet (500 mg total) by mouth 2 (two) times daily. Patient not taking: Reported on 02/03/2015 01/07/15   Janece Canterbury, MD  metroNIDAZOLE (FLAGYL) 500 MG tablet Take 1 tablet (500 mg total) by mouth 3 (three) times daily. Patient not taking: Reported on 02/03/2015 01/07/15   Janece Canterbury, MD   BP 164/85 mmHg  Pulse  87  Temp(Src) 98.5 F (36.9 C) (Oral)  Resp 16  SpO2 100% Physical Exam  Constitutional: She appears well-developed and well-nourished. No distress.  HENT:  Mouth/Throat: Oropharynx is clear and moist.  Eyes: Conjunctivae are normal. No scleral icterus.  Neck: Neck supple. No tracheal deviation present.  Cardiovascular: Normal rate, regular rhythm, normal heart sounds and intact distal pulses.   Pulmonary/Chest: Effort normal and breath sounds normal. No respiratory distress.  Abdominal: Soft. Normal appearance and bowel sounds are normal. She exhibits no distension and no mass. There is tenderness. There is no rebound and no guarding.  Mild bilateral lower quadrant tenderness, L > R. No rebound or guarding. No incarc hernia.   Genitourinary:  No cva tenderness  Musculoskeletal: She exhibits no edema.  Neurological: She is alert.  Skin: Skin is warm and dry. No rash noted. She is not diaphoretic.  Psychiatric: She has a normal mood and affect.  Nursing note and vitals reviewed.   ED Course  Procedures (including critical care time) Labs Review   Results for orders placed or performed during the hospital encounter of 02/03/15  CBC  Result Value Ref Range   WBC 5.0 4.0 - 10.5 K/uL   RBC 4.77 3.87 - 5.11 MIL/uL   Hemoglobin 14.0 12.0 - 15.0 g/dL   HCT 43.0 36.0 - 46.0 %   MCV 90.1 78.0 - 100.0 fL   MCH 29.4 26.0 - 34.0 pg   MCHC  32.6 30.0 - 36.0 g/dL   RDW 13.7 11.5 - 15.5 %   Platelets 197 150 - 400 K/uL  Comprehensive metabolic panel  Result Value Ref Range   Sodium 141 135 - 145 mmol/L   Potassium 4.0 3.5 - 5.1 mmol/L   Chloride 104 101 - 111 mmol/L   CO2 29 22 - 32 mmol/L   Glucose, Bld 101 (H) 65 - 99 mg/dL   BUN 16 6 - 20 mg/dL   Creatinine, Ser 0.93 0.44 - 1.00 mg/dL   Calcium 9.5 8.9 - 10.3 mg/dL   Total Protein 7.5 6.5 - 8.1 g/dL   Albumin 3.9 3.5 - 5.0 g/dL   AST 29 15 - 41 U/L   ALT 29 14 - 54 U/L   Alkaline Phosphatase 90 38 - 126 U/L   Total Bilirubin 0.6 0.3 - 1.2 mg/dL   GFR calc non Af Amer >60 >60 mL/min   GFR calc Af Amer >60 >60 mL/min   Anion gap 8 5 - 15  Urinalysis, Routine w reflex microscopic (not at [facility])  Result Value Ref Range   Color, Urine YELLOW YELLOW   APPearance CLEAR CLEAR   Specific Gravity, Urine >1.030 (H) 1.005 - 1.030   pH 5.5 5.0 - 8.0   Glucose, UA NEGATIVE NEGATIVE mg/dL   Hgb urine dipstick NEGATIVE NEGATIVE   Bilirubin Urine NEGATIVE NEGATIVE   Ketones, ur NEGATIVE NEGATIVE mg/dL   Protein, ur NEGATIVE NEGATIVE mg/dL   Urobilinogen, UA 0.2 0.0 - 1.0 mg/dL   Nitrite NEGATIVE NEGATIVE   Leukocytes, UA NEGATIVE NEGATIVE  Pregnancy, urine  Result Value Ref Range   Preg Test, Ur NEGATIVE NEGATIVE   Ct Abdomen Pelvis W Contrast  01/05/2015   CLINICAL DATA:  LEFT abdominal and flank pain since 01/01/2015, past history of kidney stones, ulcerative colitis, RIGHT breast cancer post lumpectomy, radiation therapy and chemotherapy, endometriosis, asthma  EXAM: CT ABDOMEN AND PELVIS WITH CONTRAST  TECHNIQUE: Multidetector CT imaging of the abdomen and pelvis was performed using the standard protocol following  bolus administration of intravenous contrast. Sagittal and coronal MPR images reconstructed from axial data set. Due to vomiting after initiation of imaging, delayed imaging was performed throughout the entirety of the abdomen and pelvis.  CONTRAST:  57m OMNIPAQUE  IOHEXOL 300 MG/ML SOLN orally, 1037mOMNIPAQUE IOHEXOL 300 MG/ML SOLN IV  COMPARISON:  02/01/2014  FINDINGS: Lung bases clear.  Small RIGHT renal cysts.  Liver, spleen, pancreas, kidneys and adrenal glands otherwise normal.  Gallbladder surgically absent.  Wall thickening of the cecum and terminal ileum consistent with an enteritis.  Question minimal rectal wall thickening versus artifact from underdistention.  Remainder of colon however is normal.  Stomach and remaining small bowel loops normal appearance.  Cluster of normal size lymph nodes in mesentery medial to cecum/ascending colon.  No mass, additional adenopathy, free fluid, or free air.  Uterus surgically absent with nonvisualization of ovaries and appendix.  Bulging versus herniated disc centrally at L4-L5, appearance unchanged.  No acute osseous findings.  IMPRESSION: Wall thickening of the cecum and terminal ileum compatible with enteritis, could represent inflammatory bowel disease or infection.  No evidence of perforation or abscess.  Clustered lymph nodes in mesentery medial to the cecum, normal to upper normal in size.   Electronically Signed   By: MaLavonia Dana.D.   On: 01/05/2015 16:42      MDM   Iv ns bolus. Labs.  Dilaudid 1 mg iv. zofran iv.  Reviewed nursing notes and prior charts for additional history.   Pt w recent ct for same.  Pt also [facility] Pt notes feeling mildly itchy after dose dilaudid.  No rash or hives noted. No throat swelling. Chest cta. No increased wob or wheezing. Vitals normal. Benadryl iv.   Discussed pt with her GI doctor, Dr HuBenson Norwayincluding lab results, vitals, etc.  He indicates pt well known to him.  States given recent improvement post tx same/similar symptoms w course of cipro and flagyl, he recommends rx cipro and flagyl.  He does not recommend pred or change in UC meds currently.  [facility] to keep that [facility] as  scheduled.  Pt improved. No nvd while in ED. abd soft. V mild LLQ tenderness. Afeb.   Discussed Dr HuMinerva Areolalan w pt, pt agreeable. Pt states has adequate flagyl at home, just needs rx for cipro.   No sob. No itching. Tolerating po.   Pt currently appears stable for d/c. Return precautions provided.      KeLajean SaverMD 02/03/15 [phone]

## 2015-02-03 NOTE — Discharge Instructions (Signed)
It was our pleasure to provide your ER care today - we hope that you feel better.  We discussed your case with Dr Benson Norway - he indicates for you to take cipro and flagyl as prescribed for the next week.  Drink plenty of fluids. Take tylenol and/or motrin as need.   Follow up with your [facility] appointment this coming Monday as planned.  Return to ER if worse, new symptoms, fevers, persistent vomiting, worsening or severe abdominal pain, severe diarrhea, other concern.  You were given pain medication in the ER - no driving for the next 4 hours.  Your earlier itching and shortness of breath may have represented an adverse, or allergic,  reaction to the pain medication (dilaudid) that you were given - inform your care providers in future as a potential allergic reaction to this medication.     Colitis Colitis is inflammation of the colon. Colitis can be a short-term or long-standing (chronic) illness. Crohn's disease and ulcerative colitis are 2 types of colitis which are chronic. They usually require lifelong treatment. CAUSES  There are many different causes of colitis, including:  Viruses.  Germs (bacteria).  Medicine reactions. SYMPTOMS   Diarrhea.  Intestinal bleeding.  Pain.  Fever.  Throwing up (vomiting).  Tiredness (fatigue).  Weight loss.  Bowel blockage. DIAGNOSIS  The diagnosis of colitis is based on examination and stool or blood tests. X-rays, CT scan, and colonoscopy may also be needed. TREATMENT  Treatment may include:  Fluids given through the vein (intravenously).  Bowel rest (nothing to eat or drink for a period of time).  Medicine for pain and diarrhea.  Medicines (antibiotics) that kill germs.  Cortisone medicines.  Surgery. HOME CARE INSTRUCTIONS   Get plenty of rest.  Drink enough water and fluids to keep your urine clear or pale yellow.  Eat a well-balanced diet.  Call your caregiver for follow-up as recommended. SEEK IMMEDIATE  MEDICAL CARE IF:   You develop chills.  You have an oral temperature above 102 F (38.9 C), not controlled by medicine.  You have extreme weakness, fainting, or dehydration.  You have repeated vomiting.  You develop severe belly (abdominal) pain or are passing bloody or tarry stools. MAKE SURE YOU:   Understand these instructions.  Will watch your condition.  Will get help right away if you are not doing well or get worse. Document Released: 09/29/2004 Document Revised: 11/14/2011 Document Reviewed: 12/25/2009 [facility] Patient Information 2015 [place], [place]. This information is not intended to replace advice given to you by your health care provider. Make sure you discuss any questions you have with your health care provider.

## 2015-03-02 ENCOUNTER — Other Ambulatory Visit: Payer: Self-pay | Admitting: Gastroenterology

## 2015-03-13 ENCOUNTER — Encounter (HOSPITAL_COMMUNITY)
Admission: RE | Disposition: A | Discharge status: Home / Self Care | Payer: Self-pay | Source: Ambulatory Visit | Attending: Gastroenterology

## 2015-03-13 ENCOUNTER — Ambulatory Visit (HOSPITAL_COMMUNITY)
Admission: RE | Admit: 2015-03-13 | Discharge: 2015-03-13 | Disposition: A | Discharge status: Home / Self Care | Payer: BLUE CROSS/BLUE SHIELD | Source: Ambulatory Visit | Attending: Gastroenterology | Admitting: Gastroenterology

## 2015-03-13 ENCOUNTER — Encounter (HOSPITAL_COMMUNITY): Payer: Self-pay

## 2015-03-13 DIAGNOSIS — K5190 Ulcerative colitis, unspecified, without complications: Secondary | ICD-10-CM | POA: Insufficient documentation

## 2015-03-13 DIAGNOSIS — G629 Polyneuropathy, unspecified: Secondary | ICD-10-CM | POA: Diagnosis not present

## 2015-03-13 DIAGNOSIS — M549 Dorsalgia, unspecified: Secondary | ICD-10-CM | POA: Insufficient documentation

## 2015-03-13 DIAGNOSIS — J45909 Unspecified asthma, uncomplicated: Secondary | ICD-10-CM | POA: Diagnosis not present

## 2015-03-13 DIAGNOSIS — M13879 Other specified arthritis, unspecified ankle and foot: Secondary | ICD-10-CM | POA: Diagnosis not present

## 2015-03-13 DIAGNOSIS — Z8673 Personal history of transient ischemic attack (TIA), and cerebral infarction without residual deficits: Secondary | ICD-10-CM | POA: Insufficient documentation

## 2015-03-13 DIAGNOSIS — Z853 Personal history of malignant neoplasm of breast: Secondary | ICD-10-CM | POA: Diagnosis not present

## 2015-03-13 DIAGNOSIS — Z9049 Acquired absence of other specified parts of digestive tract: Secondary | ICD-10-CM | POA: Insufficient documentation

## 2015-03-13 DIAGNOSIS — G8929 Other chronic pain: Secondary | ICD-10-CM | POA: Diagnosis not present

## 2015-03-13 DIAGNOSIS — E559 Vitamin D deficiency, unspecified: Secondary | ICD-10-CM | POA: Diagnosis not present

## 2015-03-13 DIAGNOSIS — Z923 Personal history of irradiation: Secondary | ICD-10-CM | POA: Diagnosis not present

## 2015-03-13 DIAGNOSIS — Z9221 Personal history of antineoplastic chemotherapy: Secondary | ICD-10-CM | POA: Insufficient documentation

## 2015-03-13 DIAGNOSIS — M542 Cervicalgia: Secondary | ICD-10-CM | POA: Diagnosis not present

## 2015-03-13 DIAGNOSIS — K219 Gastro-esophageal reflux disease without esophagitis: Secondary | ICD-10-CM | POA: Diagnosis not present

## 2015-03-13 HISTORY — PX: COLONOSCOPY WITH PROPOFOL: SHX5780

## 2015-03-13 SURGERY — COLONOSCOPY WITH PROPOFOL
Anesthesia: Moderate Sedation

## 2015-03-13 MED ORDER — DIPHENHYDRAMINE HCL 50 MG/ML IJ SOLN
INTRAMUSCULAR | Status: AC
  Filled 2015-03-13: qty 1

## 2015-03-13 MED ORDER — FENTANYL CITRATE (PF) 100 MCG/2ML IJ SOLN
INTRAMUSCULAR | Status: AC
  Filled 2015-03-13: qty 2

## 2015-03-13 MED ORDER — FENTANYL CITRATE (PF) 100 MCG/2ML IJ SOLN
INTRAMUSCULAR | Status: DC | PRN
  Administered 2015-03-13 (×4): 25 ug via INTRAVENOUS

## 2015-03-13 MED ORDER — ONDANSETRON HCL 4 MG/2ML IJ SOLN
INTRAMUSCULAR | Status: AC
  Filled 2015-03-13: qty 2

## 2015-03-13 MED ORDER — MIDAZOLAM HCL 10 MG/2ML IJ SOLN
INTRAMUSCULAR | Status: DC | PRN
  Administered 2015-03-13 (×5): 2 mg via INTRAVENOUS

## 2015-03-13 MED ORDER — MIDAZOLAM HCL 5 MG/ML IJ SOLN
INTRAMUSCULAR | Status: AC
  Filled 2015-03-13: qty 2

## 2015-03-13 MED ORDER — SODIUM CHLORIDE 0.9 % IV SOLN
INTRAVENOUS | Status: DC
  Administered 2015-03-13: 500 mL via INTRAVENOUS

## 2015-03-13 MED ORDER — ONDANSETRON HCL 4 MG/2ML IJ SOLN
4.0000 mg | Freq: Once | INTRAMUSCULAR | Status: AC
  Administered 2015-03-13: 4 mg via INTRAVENOUS

## 2015-03-13 MED ORDER — DIPHENHYDRAMINE HCL 50 MG/ML IJ SOLN
INTRAMUSCULAR | Status: DC | PRN
  Administered 2015-03-13: 25 mg via INTRAVENOUS

## 2015-03-13 SURGICAL SUPPLY — 21 items

## 2015-03-13 NOTE — H&P (Signed)
Gabrielle Hawkins HPI: This is a 49 year old female who is well-known to me.  She has UC, but she has not responded to therapy.  There is concern that she may have refractory disease and/or post-infectious IBS.  She had a C. Diff infection in the recent past.  Past Medical History  Diagnosis Date  . Neuropathy   . Fibroid   . Endometriosis   . Mucinous cystadenoma of ovary     left  . BRCA1 negative   . BRCA2 negative   . History of chemotherapy 01/2005  . Radiation 04/2005  . Hx of tamoxifen therapy   . Migraines     just at dx of breast CA  . Vitamin D deficiency   . Lymphedema of arm   . Breast cancer 2006    chem/radiation/2years tamoxifen  . Stroke     related to when taken compazine  . Asthma   . Anxiety   . Depression   . Urinary tract infection     x 3  . Kidney stones   . GERD (gastroesophageal reflux disease)   . Peripheral neuropathy   . Arthritis     big toe   . Anemia 1998   . Hx of transfusion of packed red blood cells   . Vertigo 2014  . IBS (irritable bowel syndrome)   . Paresthesias   . Ulcerative colitis     with chronic diarrhea  . Chronic neck and back pain     Past Surgical History  Procedure Laterality Date  . Tonsillectomy    . Myomectomy    . Abdominal hysterectomy  2007    TAH/BSO  . Dilation and curettage of uterus    . Breast surgery  2006    LUMPECTOMY  . Cholecystectomy    . Cholecystectomy N/A 04/29/2014    Procedure: LAPAROSCOPIC CHOLECYSTECTOMY WITH INTRAOPERATIVE CHOLANGIOGRAM;  Surgeon: Edward Jolly, MD;  Location: [facility];  Service: General;  Laterality: N/A;  . Flexible sigmoidoscopy N/A 07/18/2014    Dr. Benson Norway: mild left-sided colitis, biopsies with quiescent UC  . Colonoscopy  June 2013    Dr. Benson Norway: sessile serrated adenoma    Family History  Problem Relation Age of Onset  . Breast cancer Mother 53  . Heart failure Father   . Heart disease Father   . Cancer Maternal Grandmother     COLON  . Heart failure  Paternal Grandmother   . Heart disease Paternal Grandmother     Social History:  reports that she has never smoked. She has never used smokeless tobacco. She reports that she does not drink alcohol or use illicit drugs.  Allergies:  Allergies  Allergen Reactions  . Ampicillin Hives and Itching  . Compazine [Prochlorperazine Maleate] Other (See Comments)    "Stroke-like symptoms" Can tolerate Phenergan.  [name] Cymbalta [Duloxetine Hcl] Other (See Comments)    Fainting  . Imitrex [Sumatriptan Base] Hives and Nausea And Vomiting  . Lyrica [Pregabalin] Other (See Comments)    Fainting  . Metoclopramide Hcl Hives  . Percocet [Oxycodone-Acetaminophen] Itching  . Effexor [Venlafaxine Hydrochloride] Hives and Palpitations    Medications: Scheduled: Continuous:  No results found for this or any previous visit (from the past 24 hour(s)).   No results found.  ROS:  As stated above in the HPI otherwise negative.  There were no vitals taken for this visit.    PE: Gen: NAD, Alert and Oriented HEENT:  [place]/AT, EOMI Neck: Supple, no LAD Lungs: CTA  Bilaterally CV: RRR without M/G/R ABM: Soft, NTND, +BS Ext: No C/C/E  Assessment/Plan: 1) Ulcerative colitis - Colonoscopy.  Arno Cullers D 03/13/2015, 9:02 AM

## 2015-03-13 NOTE — Discharge Instructions (Signed)
Colonoscopy, Care After Refer to this sheet in the next few weeks. These instructions provide you with information on caring for yourself after your procedure. Your health care provider may also give you more specific instructions. Your treatment has been planned according to current medical practices, but problems sometimes occur. Call your health care provider if you have any problems or questions after your procedure. WHAT TO EXPECT AFTER THE PROCEDURE  After your procedure, it is typical to have the following:  A small amount of blood in your stool.  Moderate amounts of gas and mild abdominal cramping or bloating. HOME CARE INSTRUCTIONS  Do not drive, operate machinery, or sign important documents for 24 hours.  You may shower and resume your regular physical activities, but move at a slower pace for the first 24 hours.  Take frequent rest periods for the first 24 hours.  Walk around or put a warm pack on your abdomen to help reduce abdominal cramping and bloating.  Drink enough fluids to keep your urine clear or pale yellow.  You may resume your normal diet as instructed by your health care provider. Avoid heavy or fried foods that are hard to digest.  Avoid drinking alcohol for 24 hours or as instructed by your health care provider.  Only take over-the-counter or prescription medicines as directed by your health care provider.  If a tissue sample (biopsy) was taken during your procedure:  Do not take aspirin or blood thinners for 7 days, or as instructed by your health care provider.  Do not drink alcohol for 7 days, or as instructed by your health care provider.  Eat soft foods for the first 24 hours. SEEK MEDICAL CARE IF: You have persistent spotting of blood in your stool 2-3 days after the procedure. SEEK IMMEDIATE MEDICAL CARE IF:  You have more than a small spotting of blood in your stool.  You pass large blood clots in your stool.  Your abdomen is swollen  (distended).  You have nausea or vomiting.  You have a fever.  You have increasing abdominal pain that is not relieved with medicine. Document Released: 04/05/2004 Document Revised: 06/12/2013 Document Reviewed: 04/29/2013 [facility] Patient Information 2015 [place], [place]. This information is not intended to replace advice given to you by your health care provider. Make sure you discuss any questions you have with your health care provider.

## 2015-03-13 NOTE — Op Note (Signed)
[facility] [place], [address]   COLONOSCOPY PROCEDURE REPORT  [name], Gabrielle Hawkins  MR#: [id number] BIRTHDATE: [birth year] , 19  yrs. old GENDER: female ENDOSCOPIST: [name], MD REFERRED BY: PROCEDURE DATE:  2015-04-09 PROCEDURE:   Colonoscopy with biopsy ASA CLASS:   Class II INDICATIONS: Ulcerative colitis MEDICATIONS: Versed 10 mg IV, Fentanyl 100 mcg IV, and Benadryl 25 mg IV  DESCRIPTION OF PROCEDURE:   After the risks and benefits and of the procedure were explained, informed consent was obtained.  revealed no abnormalities of the rectum.    The Pentax Adult Colonscope [id number]  endoscope was introduced through the anus and advanced to the terminal ileum which was intubated for a short distance .  The quality of the prep was excellent. .  The instrument was then slowly withdrawn as the colon was fully examined. Estimated blood loss is zero unless otherwise noted in this procedure report.    COLON FINDINGS: A normal appearing cecum, ileocecal valve, and appendiceal orifice were identified.  The ascending, transverse, descending, sigmoid colon, and rectum appeared unremarkable. Retroflexed views revealed no abnormalities.     The scope was then withdrawn from the patient and the procedure completed.  WITHDRAWAL TIME: 15 minutes 0 seconds  COMPLICATIONS: There were no immediate complications.  ENDOSCOPIC IMPRESSION: 1) Normal colonoscopy  - No evidence of any inflammation, ulcerations, or erosions.  RECOMMENDATIONS: 1) Awiat biopsy results.  REPEAT EXAM:  cc:  _______________________________ eSignedCarol Ada, MD 2015-04-09 10:48 AM   CPT CODES: ICD CODES:  The ICD and CPT codes recommended by this software are interpretations from the data that the clinical staff has captured with the software.  The verification of the translation of this report to the ICD and CPT codes and modifiers is the  sole responsibility of the health care institution and practicing physician where this report was generated.  [employer] will not be held responsible for the validity of the ICD and CPT codes included on this report.  AMA assumes no liability for data contained or not contained herein. CPT is a Designer, television/film set of the [employer].

## 2015-03-16 ENCOUNTER — Encounter (HOSPITAL_COMMUNITY): Payer: Self-pay | Admitting: Gastroenterology

## 2015-05-18 ENCOUNTER — Encounter (HOSPITAL_COMMUNITY): Payer: Self-pay | Admitting: Emergency Medicine

## 2015-05-18 ENCOUNTER — Emergency Department (HOSPITAL_COMMUNITY)
Admission: EM | Admit: 2015-05-18 | Discharge: 2015-05-18 | Discharge status: Against Medical Advice | Payer: BLUE CROSS/BLUE SHIELD | Attending: Emergency Medicine | Admitting: Emergency Medicine

## 2015-05-18 DIAGNOSIS — R197 Diarrhea, unspecified: Secondary | ICD-10-CM | POA: Insufficient documentation

## 2015-05-18 DIAGNOSIS — G8929 Other chronic pain: Secondary | ICD-10-CM | POA: Insufficient documentation

## 2015-05-18 DIAGNOSIS — K625 Hemorrhage of anus and rectum: Secondary | ICD-10-CM | POA: Diagnosis not present

## 2015-05-18 DIAGNOSIS — R110 Nausea: Secondary | ICD-10-CM | POA: Insufficient documentation

## 2015-05-18 DIAGNOSIS — J45909 Unspecified asthma, uncomplicated: Secondary | ICD-10-CM | POA: Diagnosis not present

## 2015-05-18 DIAGNOSIS — R109 Unspecified abdominal pain: Secondary | ICD-10-CM | POA: Diagnosis present

## 2015-05-18 LAB — COMPREHENSIVE METABOLIC PANEL
ALBUMIN: 4.1 g/dL (ref 3.5–5.0)
ALT: 15 U/L (ref 14–54)
AST: 16 U/L (ref 15–41)
Alkaline Phosphatase: 101 U/L (ref 38–126)
Anion gap: 5 (ref 5–15)
BUN: 10 mg/dL (ref 6–20)
CHLORIDE: 106 mmol/L (ref 101–111)
CO2: 29 mmol/L (ref 22–32)
CREATININE: 0.68 mg/dL (ref 0.44–1.00)
Calcium: 9 mg/dL (ref 8.9–10.3)
GFR calc Af Amer: >60 mL/min (ref >60)
GFR calc non Af Amer: >60 mL/min (ref >60)
GLUCOSE: 97 mg/dL (ref 65–99)
POTASSIUM: 3.7 mmol/L (ref 3.5–5.1)
Sodium: 140 mmol/L (ref 135–145)
Total Bilirubin: 0.5 mg/dL (ref 0.3–1.2)
Total Protein: 8 g/dL (ref 6.5–8.1)

## 2015-05-18 LAB — CBC
HEMATOCRIT: 42.5 % (ref 36.0–46.0)
Hemoglobin: 14 g/dL (ref 12.0–15.0)
MCH: 29.5 pg (ref 26.0–34.0)
MCHC: 32.9 g/dL (ref 30.0–36.0)
MCV: 89.5 fL (ref 78.0–100.0)
PLATELETS: 219 10*3/uL (ref 150–400)
RBC: 4.75 MIL/uL (ref 3.87–5.11)
RDW: 13.6 % (ref 11.5–15.5)
WBC: 7 10*3/uL (ref 4.0–10.5)

## 2015-05-18 LAB — LIPASE, BLOOD: LIPASE: 18 U/L — AB (ref 22–51)

## 2015-05-18 MED ORDER — ONDANSETRON 4 MG PO TBDP
4.0000 mg | ORAL_TABLET | Freq: Once | ORAL | Status: AC | PRN
  Administered 2015-05-18: 4 mg via ORAL

## 2015-05-18 MED ORDER — ONDANSETRON 4 MG PO TBDP
ORAL_TABLET | ORAL | Status: AC
  Filled 2015-05-18: qty 1

## 2015-05-18 NOTE — ED Notes (Signed)
Pt has hx of ulerative colitis , believes she is having a flare up of this - bloody , mucus diarrhea since Saturday night with nausea

## 2015-05-21 ENCOUNTER — Other Ambulatory Visit: Payer: Self-pay | Admitting: Gastroenterology

## 2015-05-21 DIAGNOSIS — R1084 Generalized abdominal pain: Secondary | ICD-10-CM

## 2015-05-24 ENCOUNTER — Encounter (HOSPITAL_COMMUNITY): Payer: Self-pay

## 2015-05-24 ENCOUNTER — Emergency Department (HOSPITAL_COMMUNITY)
Admission: EM | Admit: 2015-05-24 | Discharge: 2015-05-24 | Disposition: A | Discharge status: Home / Self Care | Payer: BLUE CROSS/BLUE SHIELD | Attending: Emergency Medicine | Admitting: Emergency Medicine

## 2015-05-24 DIAGNOSIS — G629 Polyneuropathy, unspecified: Secondary | ICD-10-CM | POA: Diagnosis not present

## 2015-05-24 DIAGNOSIS — Z86018 Personal history of other benign neoplasm: Secondary | ICD-10-CM | POA: Insufficient documentation

## 2015-05-24 DIAGNOSIS — Z7952 Long term (current) use of systemic steroids: Secondary | ICD-10-CM | POA: Insufficient documentation

## 2015-05-24 DIAGNOSIS — Z7951 Long term (current) use of inhaled steroids: Secondary | ICD-10-CM | POA: Diagnosis not present

## 2015-05-24 DIAGNOSIS — F329 Major depressive disorder, single episode, unspecified: Secondary | ICD-10-CM | POA: Diagnosis not present

## 2015-05-24 DIAGNOSIS — Z87440 Personal history of urinary (tract) infections: Secondary | ICD-10-CM | POA: Insufficient documentation

## 2015-05-24 DIAGNOSIS — Z79899 Other long term (current) drug therapy: Secondary | ICD-10-CM | POA: Insufficient documentation

## 2015-05-24 DIAGNOSIS — G8929 Other chronic pain: Secondary | ICD-10-CM | POA: Insufficient documentation

## 2015-05-24 DIAGNOSIS — Z3202 Encounter for pregnancy test, result negative: Secondary | ICD-10-CM | POA: Diagnosis not present

## 2015-05-24 DIAGNOSIS — Z8639 Personal history of other endocrine, nutritional and metabolic disease: Secondary | ICD-10-CM | POA: Diagnosis not present

## 2015-05-24 DIAGNOSIS — Z8673 Personal history of transient ischemic attack (TIA), and cerebral infarction without residual deficits: Secondary | ICD-10-CM | POA: Insufficient documentation

## 2015-05-24 DIAGNOSIS — Z853 Personal history of malignant neoplasm of breast: Secondary | ICD-10-CM | POA: Diagnosis not present

## 2015-05-24 DIAGNOSIS — K219 Gastro-esophageal reflux disease without esophagitis: Secondary | ICD-10-CM | POA: Diagnosis not present

## 2015-05-24 DIAGNOSIS — K51911 Ulcerative colitis, unspecified with rectal bleeding: Secondary | ICD-10-CM | POA: Insufficient documentation

## 2015-05-24 DIAGNOSIS — F419 Anxiety disorder, unspecified: Secondary | ICD-10-CM | POA: Insufficient documentation

## 2015-05-24 DIAGNOSIS — J45909 Unspecified asthma, uncomplicated: Secondary | ICD-10-CM | POA: Insufficient documentation

## 2015-05-24 DIAGNOSIS — M1990 Unspecified osteoarthritis, unspecified site: Secondary | ICD-10-CM | POA: Insufficient documentation

## 2015-05-24 DIAGNOSIS — Z8742 Personal history of other diseases of the female genital tract: Secondary | ICD-10-CM | POA: Insufficient documentation

## 2015-05-24 DIAGNOSIS — R109 Unspecified abdominal pain: Secondary | ICD-10-CM | POA: Diagnosis present

## 2015-05-24 LAB — URINALYSIS, ROUTINE W REFLEX MICROSCOPIC
Bilirubin Urine: NEGATIVE
Glucose, UA: NEGATIVE mg/dL
Hgb urine dipstick: NEGATIVE
KETONES UR: NEGATIVE mg/dL
Leukocytes, UA: NEGATIVE
Nitrite: NEGATIVE
PH: 6 (ref 5.0–8.0)
PROTEIN: NEGATIVE mg/dL
Specific Gravity, Urine: 1.035 — ABNORMAL HIGH (ref 1.005–1.030)
Urobilinogen, UA: 1 mg/dL (ref 0.0–1.0)

## 2015-05-24 LAB — COMPREHENSIVE METABOLIC PANEL
ALT: 15 U/L (ref 14–54)
AST: 18 U/L (ref 15–41)
Albumin: 3.8 g/dL (ref 3.5–5.0)
Alkaline Phosphatase: 121 U/L (ref 38–126)
Anion gap: 8 (ref 5–15)
BUN: 19 mg/dL (ref 6–20)
CALCIUM: 9.1 mg/dL (ref 8.9–10.3)
CHLORIDE: 101 mmol/L (ref 101–111)
CO2: 27 mmol/L (ref 22–32)
Creatinine, Ser: 0.88 mg/dL (ref 0.44–1.00)
GFR calc Af Amer: >60 mL/min (ref >60)
Glucose, Bld: 100 mg/dL — ABNORMAL HIGH (ref 65–99)
Potassium: 3.8 mmol/L (ref 3.5–5.1)
Sodium: 136 mmol/L (ref 135–145)
Total Bilirubin: 0.4 mg/dL (ref 0.3–1.2)
Total Protein: 7.9 g/dL (ref 6.5–8.1)

## 2015-05-24 LAB — CBC
HCT: 43.8 % (ref 36.0–46.0)
Hemoglobin: 14.5 g/dL (ref 12.0–15.0)
MCH: 29.5 pg (ref 26.0–34.0)
MCHC: 33.1 g/dL (ref 30.0–36.0)
MCV: 89.2 fL (ref 78.0–100.0)
PLATELETS: 233 10*3/uL (ref 150–400)
RBC: 4.91 MIL/uL (ref 3.87–5.11)
RDW: 13.6 % (ref 11.5–15.5)
WBC: 7 10*3/uL (ref 4.0–10.5)

## 2015-05-24 LAB — I-STAT BETA HCG BLOOD, ED (MC, WL, AP ONLY): I-stat hCG, quantitative: <5 m[IU]/mL (ref <5)

## 2015-05-24 LAB — LIPASE, BLOOD: Lipase: 26 U/L (ref 22–51)

## 2015-05-24 MED ORDER — PREDNISONE 20 MG PO TABS: 40.0000 mg | ORAL_TABLET | Freq: Every day | ORAL | Status: DC

## 2015-05-24 MED ORDER — ONDANSETRON HCL 4 MG/2ML IJ SOLN
4.0000 mg | Freq: Once | INTRAMUSCULAR | Status: AC
  Administered 2015-05-24: 4 mg via INTRAVENOUS
  Filled 2015-05-24: qty 2

## 2015-05-24 MED ORDER — SODIUM CHLORIDE 0.9 % IV BOLUS (SEPSIS)
1000.0000 mL | Freq: Once | INTRAVENOUS | Status: AC
  Administered 2015-05-24: 1000 mL via INTRAVENOUS

## 2015-05-24 MED ORDER — METHYLPREDNISOLONE SODIUM SUCC 125 MG IJ SOLR
125.0000 mg | Freq: Once | INTRAMUSCULAR | Status: AC
  Administered 2015-05-24: 125 mg via INTRAVENOUS
  Filled 2015-05-24: qty 2

## 2015-05-24 MED ORDER — MORPHINE SULFATE (PF) 4 MG/ML IV SOLN
4.0000 mg | Freq: Once | INTRAVENOUS | Status: AC
  Administered 2015-05-24: 4 mg via INTRAVENOUS
  Filled 2015-05-24: qty 1

## 2015-05-24 MED ORDER — HYDROCODONE-ACETAMINOPHEN 5-325 MG PO TABS: 1.0000 | ORAL_TABLET | ORAL | Status: DC | PRN

## 2015-05-24 NOTE — ED Provider Notes (Signed)
CSN: [id number]     Arrival date & time 05/24/15  1803 History   First MD Initiated Contact with Patient 05/24/15 1824     Chief Complaint  Patient presents with  . Abdominal Pain  . Nausea  . Diarrhea     (Consider location/radiation/quality/duration/timing/severity/associated sxs/prior Treatment) HPI Comments: Patient presents to the ER for evaluation of abdominal pain. Patient reports that she has been experiencing nausea, left-sided abdominal pain and cramping for one week. She has been expressing diarrhea as well as bloody, mucous diarrhea. She has not noticed any fever. Patient does have a history of ulcerative colitis, feels that the symptoms are consistent with a flare.  Patient is a 49 y.o. female presenting with abdominal pain and diarrhea.  Abdominal Pain Associated symptoms: diarrhea   Diarrhea Associated symptoms: abdominal pain     Past Medical History  Diagnosis Date  . Neuropathy   . Fibroid   . Endometriosis   . Mucinous cystadenoma of ovary     left  . BRCA1 negative   . BRCA2 negative   . History of chemotherapy 01/2005  . Radiation 04/2005  . Hx of tamoxifen therapy   . Migraines     just at dx of breast CA  . Vitamin D deficiency   . Lymphedema of arm   . Breast cancer 2006    chem/radiation/2years tamoxifen  . Stroke     related to when taken compazine  . Asthma   . Anxiety   . Depression   . Urinary tract infection     x 3  . Kidney stones   . GERD (gastroesophageal reflux disease)   . Peripheral neuropathy   . Arthritis     big toe   . Anemia 1998   . Hx of transfusion of packed red blood cells   . Vertigo 2014  . IBS (irritable bowel syndrome)   . Paresthesias   . Ulcerative colitis     with chronic diarrhea  . Chronic neck and back pain    Past Surgical History  Procedure Laterality Date  . Tonsillectomy    . Myomectomy    . Abdominal hysterectomy  2007    TAH/BSO  . Dilation and curettage of uterus    . Breast surgery  2006     LUMPECTOMY  . Cholecystectomy    . Cholecystectomy N/A 04/29/2014    Procedure: LAPAROSCOPIC CHOLECYSTECTOMY WITH INTRAOPERATIVE CHOLANGIOGRAM;  Surgeon: Edward Jolly, MD;  Location: [facility];  Service: General;  Laterality: N/A;  . Flexible sigmoidoscopy N/A 07/18/2014    Dr. Benson Norway: mild left-sided colitis, biopsies with quiescent UC  . Colonoscopy  June 2013    Dr. Benson Norway: sessile serrated adenoma  . Colonoscopy with propofol N/A 03/13/2015    Procedure: COLONOSCOPY WITH PROPOFOL;  Surgeon: [name], MD;  Location: [facility];  Service: Endoscopy;  Laterality: N/A;   Family History  Problem Relation Age of Onset  . Breast cancer Mother 27  . Heart failure Father   . Heart disease Father   . Cancer Maternal Grandmother     COLON  . Heart failure Paternal Grandmother   . Heart disease Paternal Grandmother    Social History  Substance Use Topics  . Smoking status: Never Smoker   . Smokeless tobacco: Never Used  . Alcohol Use: No   OB History    Gravida Para Term Preterm AB TAB SAB Ectopic Multiple Living   1 0   1  1   0  Review of Systems  Gastrointestinal: Positive for abdominal pain, diarrhea and blood in stool.  All other systems reviewed and are negative.     Allergies  Ampicillin; Compazine; Cymbalta; Imitrex; Lyrica; Metoclopramide hcl; Percocet; and Effexor  Home Medications   Prior to Admission medications   Medication Sig Start Date End Date Taking? Authorizing Provider  acidophilus (RISAQUAD) CAPS capsule Take 1 capsule by mouth daily.   Yes Historical Provider, MD  amitriptyline (ELAVIL) 25 MG tablet Take 25 mg by mouth at bedtime.  05/20/15  Yes Historical Provider, MD  azaTHIOprine (IMURAN) 50 MG tablet Take 225 mg by mouth daily.    Yes Historical Provider, MD  cetirizine (ZYRTEC) 10 MG tablet Take 10 mg by mouth daily.   Yes Historical Provider, MD  citalopram (CELEXA) 20 MG tablet Take 20 mg by mouth daily.  05/20/15  Yes Historical  Provider, MD  cyanocobalamin (,VITAMIN B-12,) 1000 MCG/ML injection Inject 1,000 mcg into the muscle every 30 (thirty) days.   Yes Historical Provider, MD  dicyclomine (BENTYL) 20 MG tablet Take 20 mg by mouth at bedtime.    Yes Historical Provider, MD  Eluxadoline 75 MG TABS Take 1 tablet by mouth daily.   Yes Historical Provider, MD  gabapentin (NEURONTIN) 300 MG capsule Take 300 mg by mouth at bedtime.    Yes Historical Provider, MD  hyoscyamine (LEVSIN, ANASPAZ) 0.125 MG tablet Take 0.125 mg by mouth 2 (two) times daily.    Yes Historical Provider, MD  mesalamine (LIALDA) 1.2 G EC tablet Take 2.4 g by mouth at bedtime.    Yes Historical Provider, MD  ondansetron (ZOFRAN) 4 MG tablet Take 4 mg by mouth every 6 (six) hours as needed for nausea or vomiting.   Yes Historical Provider, MD  acetaminophen (TYLENOL) 500 MG tablet Take 1,000 mg by mouth every 6 (six) hours as needed.    Historical Provider, MD  albuterol (PROVENTIL HFA;VENTOLIN HFA) 108 (90 BASE) MCG/ACT inhaler Inhale 1-2 puffs into the lungs every 6 (six) hours as needed for wheezing. 05/08/13   Janne Napoleon, NP  beclomethasone (QVAR) 80 MCG/ACT inhaler Inhale 1 puff into the lungs daily as needed (Shortness of Breath).    Historical Provider, MD  HYDROcodone-acetaminophen (NORCO/VICODIN) 5-325 MG per tablet Take 1-2 tablets by mouth every 4 (four) hours as needed for moderate pain. 05/24/15   Orpah Greek, MD  predniSONE (DELTASONE) 20 MG tablet Take 2 tablets (40 mg total) by mouth daily with breakfast. 05/24/15   Orpah Greek, MD   BP 124/70 mmHg  Pulse 86  Temp(Src) 97.6 F (36.4 C) (Oral)  Resp 18  Ht 5' 8.5" (1.74 m)  Wt 194 lb (87.998 kg)  BMI 29.07 kg/m2  SpO2 100% Physical Exam  Constitutional: She is oriented to person, place, and time. She appears well-developed and well-nourished. No distress.  HENT:  Head: Normocephalic and atraumatic.  Right Ear: Hearing normal.  Left Ear: Hearing normal.  Nose:  Nose normal.  Mouth/Throat: Oropharynx is clear and moist and mucous membranes are normal.  Eyes: Conjunctivae and EOM are normal. Pupils are equal, round, and reactive to light.  Neck: Normal range of motion. Neck supple.  Cardiovascular: Regular rhythm, S1 normal and S2 normal.  Exam reveals no gallop and no friction rub.   No murmur heard. Pulmonary/Chest: Effort normal and breath sounds normal. No respiratory distress. She exhibits no tenderness.  Abdominal: Soft. Normal appearance and bowel sounds are normal. There is no hepatosplenomegaly. There is tenderness in  the left lower quadrant. There is no rebound, no guarding, no tenderness at McBurney's point and negative Murphy's sign. No hernia.  Musculoskeletal: Normal range of motion.  Neurological: She is alert and oriented to person, place, and time. She has normal strength. No cranial nerve deficit or sensory deficit. Coordination normal. GCS eye subscore is 4. GCS verbal subscore is 5. GCS motor subscore is 6.  Skin: Skin is warm, dry and intact. No rash noted. No cyanosis.  Psychiatric: She has a normal mood and affect. Her speech is normal and behavior is normal. Thought content normal.  Nursing note and vitals reviewed.   ED Course  Procedures (including critical care time) Labs Review Labs Reviewed  COMPREHENSIVE METABOLIC PANEL - Abnormal; Notable for the following:    Glucose, Bld 100 (*)    All other components within normal limits  URINALYSIS, ROUTINE W REFLEX MICROSCOPIC (NOT AT [facility]) - Abnormal; Notable for the following:    Color, Urine AMBER (*)    Specific Gravity, Urine 1.035 (*)    All other components within normal limits  LIPASE, BLOOD  CBC  I-STAT BETA HCG BLOOD, ED ([facility], [facility], [facility] ONLY)    Imaging Review No results found. I have personally reviewed and evaluated these images and lab results as part of my medical decision-making.   EKG Interpretation None      MDM   Final diagnoses:  Ulcerative  colitis, with rectal bleeding    Presented to the ER for evaluation of abdominal pain, diarrhea and rectal bleeding. Patient has a history of ulcerative colitis. She felt that the flare was similar to previous flares. She had a fairly benign, will exam. There was no signs of peritonitis. She did not require imaging. Lab work was unremarkable. No sign of anemia. Discussed briefly with Dr. Deatra Ina, on call for GI. He recommended prednisone as an outpatient, call her primary GI physician, Dr. Benson Norway tomorrow for follow-up.    Orpah Greek, MD 05/25/15 [phone]

## 2015-05-24 NOTE — Discharge Instructions (Signed)
Ulcerative Colitis Ulcerative colitis is a long lasting swelling and soreness (inflammation) of the colon (large intestine). In patients with ulcerative colitis, sores (ulcers) and inflammation of the inner lining of the colon lead to illness. Ulcerative colitis can also cause problems outside the digestive tract.  Ulcerative colitis is closely related to another condition of inflammation of the intestines called Crohn's disease. Together, they are frequently referred to as inflammatory bowel disease (IBD). Ulcerative colitis and Crohn's diseases are conditions that can last years to decades. Men and women are affected equally. They most commonly begin during adolescence and early adulthood. SYMPTOMS  Common symptoms of ulcerative colitis include rectal bleeding and diarrhea. There is a wide range of symptoms among patients with this disease depending on how severe the disease is. Some of these symptoms are:  Abdominal pain or cramping.  Diarrhea.  Fever.  Tiredness (fatigue).  Weight loss.  Night sweats.  Rectal pain.  Feeling the immediate need to have a bowel movement (rectal urgency). CAUSES  Ulcerative colitis is caused by increased activity of the immune system in the intestines. The immune system is the system that protects the body against disease such as harmful bacteria, viruses, fungi, and other foreign invaders. When the immune system overacts, it causes inflammation. The cause of the increased immune system activity is not known. This over activity causes long-lasting inflammation and ulceration. This condition may be passed down from your parents (inherited). Brothers, sisters, children, and parents of patients with IBD are more likely to develop these diseases. It is not contagious. This means you cannot catch it from someone else. DIAGNOSIS  Your caregiver may suspect ulcerative colitis based on your symptoms and exam. Blood tests may confirm that there is a problem. You may  be asked to submit a stool specimen for examination. X-rays and CT scans may be necessary. Ultimately, the diagnosis is usually made after a flexible tube is inserted via your anus and your colon is examined under sedation (colonoscopy). With this test, the specialist can take a tiny tissue sample from inside the bowel (biopsy). Examination of this biopsy tissue under a microscopy can reveal ulcerative colitis as the cause of your symptoms. TREATMENT   There is no cure for ulcerative colitis.  Complications such as massive bleeding from the colon (hemorrhage), development of a hole in the colon (perforation), or the development of precancerous or cancerous changes of the colon may require surgery.  Medications are often used to decrease inflammation and control the immune system. These include medicines related to aspirin, steroid medications, and newer and stronger medications to slow down the immune system. Some medications may be used as suppositories or enemas. A number of other medications are used or have been studied. Your caregiver will make specific recommendations. HOME CARE INSTRUCTIONS   There is no cure for ulcerative colitis disease. The best treatment is frequent checkups with your caregiver. Periodic reevaluation is important.  Symptoms such as diarrhea can be controlled with medications. Avoid foods that have a laxative effect such fresh fruit and vegetables and dairy products. During flare ups, you can rest your bowel by staying away from solid foods. Drink clear liquids frequently during the day. Electrolyte or rehydrating fluids are best. Your caregiver can help you with suggestions. Drink often to prevent dehydration. When diarrhea has cleared, eat smaller meals and more often. Avoid food additives and stimulants such as caffeine (coffee, tea, many sodas, or chocolate). Avoid dairy products. Enzyme supplements may help if you develop intolerance to  a sugar in dairy products  (lactose). Ask your caregiver or dietitian about specific dietary instructions.  If you had surgery, be sure you understand your care instructions thoroughly, including proper care of any surgical wounds.  Take any medications exactly as prescribed.  Try to maintain a positive attitude. Learn relaxation techniques such as self hypnosis, mental imaging, and muscle relaxation. If possible, avoid stresses that aggravate your condition. Exercise regularly. Follow your diet. Always get plenty of rest. SEEK MEDICAL CARE IF:   Your symptoms fail to improve after a week or two of new treatment.  You experience continued weight loss.  You have ongoing crampy digestion or loose bowels.  You develop a new skin rash, skin sores, or eye problems. SEEK IMMEDIATE MEDICAL CARE IF:   You have worsening of your symptoms or develop new symptoms.  You have an oral temperature above 102 F (38.9 C), not controlled by medicine.  You develop bloody diarrhea.  You have severe abdominal pain. Document Released: 06/01/2005 Document Revised: 11/14/2011 Document Reviewed: 05/01/2007 [facility] Patient Information 2015 [place], [place]. This information is not intended to replace advice given to you by your health care provider. Make sure you discuss any questions you have with your health care provider.

## 2015-05-24 NOTE — ED Notes (Signed)
Bed: WV14 Expected date:  Expected time:  Means of arrival:  Comments: Save for triage 2

## 2015-05-24 NOTE — ED Notes (Signed)
Patient c/o mid abdominal pain x 2 days. Patient also c/o nausea and having mucous and blood in her stools. Patient has a history of ulcerative colitis.

## 2015-05-28 ENCOUNTER — Ambulatory Visit
Admission: RE | Admit: 2015-05-28 | Discharge: 2015-05-28 | Disposition: A | Discharge status: Home / Self Care | Payer: BLUE CROSS/BLUE SHIELD | Source: Ambulatory Visit | Attending: Gastroenterology | Admitting: Gastroenterology

## 2015-05-28 DIAGNOSIS — R1084 Generalized abdominal pain: Secondary | ICD-10-CM

## 2015-05-28 MED ORDER — IOPAMIDOL (ISOVUE-300) INJECTION 61%
100.0000 mL | Freq: Once | INTRAVENOUS | Status: AC | PRN
  Administered 2015-05-28: 100 mL via INTRAVENOUS

## 2015-08-08 ENCOUNTER — Emergency Department (HOSPITAL_COMMUNITY)
Admission: EM | Admit: 2015-08-08 | Discharge: 2015-08-08 | Disposition: A | Discharge status: Home / Self Care | Payer: BLUE CROSS/BLUE SHIELD | Attending: Emergency Medicine | Admitting: Emergency Medicine

## 2015-08-08 ENCOUNTER — Encounter (HOSPITAL_COMMUNITY): Payer: Self-pay

## 2015-08-08 ENCOUNTER — Emergency Department (HOSPITAL_COMMUNITY): Payer: BLUE CROSS/BLUE SHIELD

## 2015-08-08 DIAGNOSIS — Z86018 Personal history of other benign neoplasm: Secondary | ICD-10-CM | POA: Diagnosis not present

## 2015-08-08 DIAGNOSIS — K589 Irritable bowel syndrome without diarrhea: Secondary | ICD-10-CM | POA: Insufficient documentation

## 2015-08-08 DIAGNOSIS — Z8673 Personal history of transient ischemic attack (TIA), and cerebral infarction without residual deficits: Secondary | ICD-10-CM | POA: Diagnosis not present

## 2015-08-08 DIAGNOSIS — Z87440 Personal history of urinary (tract) infections: Secondary | ICD-10-CM | POA: Insufficient documentation

## 2015-08-08 DIAGNOSIS — Z9049 Acquired absence of other specified parts of digestive tract: Secondary | ICD-10-CM | POA: Insufficient documentation

## 2015-08-08 DIAGNOSIS — R51 Headache: Secondary | ICD-10-CM | POA: Insufficient documentation

## 2015-08-08 DIAGNOSIS — F329 Major depressive disorder, single episode, unspecified: Secondary | ICD-10-CM | POA: Diagnosis not present

## 2015-08-08 DIAGNOSIS — G629 Polyneuropathy, unspecified: Secondary | ICD-10-CM | POA: Diagnosis not present

## 2015-08-08 DIAGNOSIS — Z7952 Long term (current) use of systemic steroids: Secondary | ICD-10-CM | POA: Insufficient documentation

## 2015-08-08 DIAGNOSIS — R109 Unspecified abdominal pain: Secondary | ICD-10-CM

## 2015-08-08 DIAGNOSIS — Z8742 Personal history of other diseases of the female genital tract: Secondary | ICD-10-CM | POA: Diagnosis not present

## 2015-08-08 DIAGNOSIS — Z90710 Acquired absence of both cervix and uterus: Secondary | ICD-10-CM | POA: Insufficient documentation

## 2015-08-08 DIAGNOSIS — Z79899 Other long term (current) drug therapy: Secondary | ICD-10-CM | POA: Diagnosis not present

## 2015-08-08 DIAGNOSIS — M1990 Unspecified osteoarthritis, unspecified site: Secondary | ICD-10-CM | POA: Diagnosis not present

## 2015-08-08 DIAGNOSIS — R1084 Generalized abdominal pain: Secondary | ICD-10-CM | POA: Diagnosis present

## 2015-08-08 DIAGNOSIS — K219 Gastro-esophageal reflux disease without esophagitis: Secondary | ICD-10-CM | POA: Diagnosis not present

## 2015-08-08 DIAGNOSIS — Z853 Personal history of malignant neoplasm of breast: Secondary | ICD-10-CM | POA: Diagnosis not present

## 2015-08-08 DIAGNOSIS — J45909 Unspecified asthma, uncomplicated: Secondary | ICD-10-CM | POA: Insufficient documentation

## 2015-08-08 DIAGNOSIS — R197 Diarrhea, unspecified: Secondary | ICD-10-CM

## 2015-08-08 DIAGNOSIS — Z862 Personal history of diseases of the blood and blood-forming organs and certain disorders involving the immune mechanism: Secondary | ICD-10-CM | POA: Insufficient documentation

## 2015-08-08 DIAGNOSIS — F419 Anxiety disorder, unspecified: Secondary | ICD-10-CM | POA: Diagnosis not present

## 2015-08-08 DIAGNOSIS — G8929 Other chronic pain: Secondary | ICD-10-CM | POA: Diagnosis not present

## 2015-08-08 LAB — COMPREHENSIVE METABOLIC PANEL
ALK PHOS: 97 U/L (ref 38–126)
ALT: 15 U/L (ref 14–54)
ANION GAP: 5 (ref 5–15)
AST: 18 U/L (ref 15–41)
Albumin: 4 g/dL (ref 3.5–5.0)
BILIRUBIN TOTAL: 0.3 mg/dL (ref 0.3–1.2)
BUN: 12 mg/dL (ref 6–20)
CALCIUM: 9.7 mg/dL (ref 8.9–10.3)
CO2: 24 mmol/L (ref 22–32)
Chloride: 109 mmol/L (ref 101–111)
Creatinine, Ser: 0.74 mg/dL (ref 0.44–1.00)
GFR calc non Af Amer: >60 mL/min (ref >60)
Glucose, Bld: 125 mg/dL — ABNORMAL HIGH (ref 65–99)
Potassium: 3.7 mmol/L (ref 3.5–5.1)
Sodium: 138 mmol/L (ref 135–145)
TOTAL PROTEIN: 7.6 g/dL (ref 6.5–8.1)

## 2015-08-08 LAB — CBC WITH DIFFERENTIAL/PLATELET
Basophils Absolute: 0 10*3/uL (ref 0.0–0.1)
Basophils Relative: 0 %
EOS ABS: 0.1 10*3/uL (ref 0.0–0.7)
Eosinophils Relative: 1 %
HEMATOCRIT: 41.8 % (ref 36.0–46.0)
HEMOGLOBIN: 14 g/dL (ref 12.0–15.0)
LYMPHS ABS: 1.5 10*3/uL (ref 0.7–4.0)
Lymphocytes Relative: 31 %
MCH: 29.9 pg (ref 26.0–34.0)
MCHC: 33.5 g/dL (ref 30.0–36.0)
MCV: 89.1 fL (ref 78.0–100.0)
MONO ABS: 0.4 10*3/uL (ref 0.1–1.0)
MONOS PCT: 7 %
NEUTROS ABS: 2.9 10*3/uL (ref 1.7–7.7)
NEUTROS PCT: 61 %
Platelets: 183 10*3/uL (ref 150–400)
RBC: 4.69 MIL/uL (ref 3.87–5.11)
RDW: 13.8 % (ref 11.5–15.5)
WBC: 4.8 10*3/uL (ref 4.0–10.5)

## 2015-08-08 LAB — URINALYSIS, ROUTINE W REFLEX MICROSCOPIC
BILIRUBIN URINE: NEGATIVE
Glucose, UA: NEGATIVE mg/dL
Hgb urine dipstick: NEGATIVE
KETONES UR: NEGATIVE mg/dL
Leukocytes, UA: NEGATIVE
NITRITE: NEGATIVE
Protein, ur: NEGATIVE mg/dL
Specific Gravity, Urine: 1.02 (ref 1.005–1.030)
pH: 7 (ref 5.0–8.0)

## 2015-08-08 LAB — LIPASE, BLOOD: LIPASE: 23 U/L (ref 11–51)

## 2015-08-08 MED ORDER — BARIUM SULFATE 2 % PO SUSP
450.0000 mL | Freq: Once | ORAL | Status: DC
  Filled 2015-08-08: qty 450

## 2015-08-08 MED ORDER — MORPHINE SULFATE (PF) 4 MG/ML IV SOLN
4.0000 mg | Freq: Once | INTRAVENOUS | Status: AC
  Administered 2015-08-08: 4 mg via INTRAVENOUS
  Filled 2015-08-08: qty 1

## 2015-08-08 MED ORDER — BARIUM SULFATE 2.1 % PO SUSP
ORAL | Status: AC
  Filled 2015-08-08: qty 1

## 2015-08-08 MED ORDER — DIPHENHYDRAMINE HCL 50 MG/ML IJ SOLN
25.0000 mg | Freq: Once | INTRAMUSCULAR | Status: AC
  Administered 2015-08-08: 25 mg via INTRAVENOUS
  Filled 2015-08-08: qty 1

## 2015-08-08 MED ORDER — ONDANSETRON HCL 4 MG/2ML IJ SOLN
4.0000 mg | Freq: Once | INTRAMUSCULAR | Status: AC
  Administered 2015-08-08: 4 mg via INTRAVENOUS
  Filled 2015-08-08: qty 2

## 2015-08-08 MED ORDER — SODIUM CHLORIDE 0.9 % IV BOLUS (SEPSIS)
1000.0000 mL | Freq: Once | INTRAVENOUS | Status: AC
  Administered 2015-08-08: 1000 mL via INTRAVENOUS

## 2015-08-08 MED ORDER — IOHEXOL 300 MG/ML  SOLN
100.0000 mL | Freq: Once | INTRAMUSCULAR | Status: AC | PRN
  Administered 2015-08-08: 100 mL via INTRAVENOUS

## 2015-08-08 MED ORDER — KETOROLAC TROMETHAMINE 30 MG/ML IJ SOLN
30.0000 mg | Freq: Once | INTRAMUSCULAR | Status: AC
  Administered 2015-08-08: 30 mg via INTRAVENOUS
  Filled 2015-08-08: qty 1

## 2015-08-08 NOTE — ED Provider Notes (Signed)
CSN: [id number]     Arrival date & time 08/08/15  0500 History   First MD Initiated Contact with Patient 08/08/15 0522     Chief Complaint  Patient presents with  . Abdominal Pain     (Consider location/radiation/quality/duration/timing/severity/associated sxs/prior Treatment) HPI Comments: Patient with history of ulcerative colitis presenting with diffuse abdominal pain progressively worsening over the week with diarrhea that started 2 days ago. Reports 5 episodes per day of nonbloody diarrhea for the past 2 days. Nausea but no vomiting. No fever. No dysuria hematuria. Also endorses gradual onset headache that is diffuse for the past week. Saw her GI Dr. Benson Norway 4 days ago and was told to start tapering down her steroids. She is currently on 30 mg of prednisone daily. Her last Humira dose was on November 24. She feels that she is having an exacerbation because his been a lot of stress in her life lately with death of some family members. Her blood pressure is also elevated with no history of the same.  The history is provided by the patient and a relative.    Past Medical History  Diagnosis Date  . Neuropathy ([place])   . Fibroid   . Endometriosis   . Mucinous cystadenoma of ovary     left  . BRCA1 negative   . BRCA2 negative   . History of chemotherapy 01/2005  . Radiation 04/2005  . Hx of tamoxifen therapy   . Migraines     just at dx of breast CA  . Vitamin D deficiency   . Lymphedema of arm   . Breast cancer ([place]) 2006    chem/radiation/2years tamoxifen  . Stroke [facility])     related to when taken compazine  . Asthma   . Anxiety   . Depression   . Urinary tract infection     x 3  . Kidney stones   . GERD (gastroesophageal reflux disease)   . Peripheral neuropathy ([place])   . Arthritis     big toe   . Anemia 1998   . Hx of transfusion of packed red blood cells   . Vertigo 2014  . IBS (irritable bowel syndrome)   . Paresthesias   . Ulcerative colitis ([place])     with chronic  diarrhea  . Chronic neck and back pain    Past Surgical History  Procedure Laterality Date  . Tonsillectomy    . Myomectomy    . Abdominal hysterectomy  2007    TAH/BSO  . Dilation and curettage of uterus    . Breast surgery  2006    LUMPECTOMY  . Cholecystectomy    . Cholecystectomy N/A 04/29/2014    Procedure: LAPAROSCOPIC CHOLECYSTECTOMY WITH INTRAOPERATIVE CHOLANGIOGRAM;  Surgeon: Edward Jolly, MD;  Location: [facility] ORS;  Service: General;  Laterality: N/A;  . Flexible sigmoidoscopy N/A 07/18/2014    Dr. Benson Norway: mild left-sided colitis, biopsies with quiescent UC  . Colonoscopy  June 2013    Dr. Benson Norway: sessile serrated adenoma  . Colonoscopy with propofol N/A 03/13/2015    Procedure: COLONOSCOPY WITH PROPOFOL;  Surgeon: [name], MD;  Location: [facility] ENDOSCOPY;  Service: Endoscopy;  Laterality: N/A;   Family History  Problem Relation Age of Onset  . Breast cancer Mother 39  . Heart failure Father   . Heart disease Father   . Cancer Maternal Grandmother     COLON  . Heart failure Paternal Grandmother   . Heart disease Paternal Grandmother    Social History  Substance Use Topics  . Smoking status: Never Smoker   . Smokeless tobacco: Never Used  . Alcohol Use: No   OB History    Gravida Para Term Preterm AB TAB SAB Ectopic Multiple Living   1 0   1  1   0     Review of Systems  Constitutional: Negative for fever, activity change and appetite change.  HENT: Negative for congestion and rhinorrhea.   Eyes: Negative for visual disturbance.  Respiratory: Negative for cough, chest tightness and shortness of breath.   Cardiovascular: Negative for chest pain.  Gastrointestinal: Positive for nausea, abdominal pain and diarrhea. Negative for vomiting, constipation and blood in stool.  Genitourinary: Negative for dysuria, hematuria, vaginal bleeding and vaginal discharge.  Musculoskeletal: Negative for back pain, joint swelling, arthralgias and gait problem.  Skin: Negative  for wound.  Neurological: Positive for headaches. Negative for dizziness, weakness and light-headedness.  A complete 10 system review of systems was obtained and all systems are negative except as noted in the HPI and PMH.      Allergies  Ampicillin; Compazine; Cymbalta; Imitrex; Lyrica; Metoclopramide hcl; Percocet; and Effexor  Home Medications   Prior to Admission medications   Medication Sig Start Date End Date Taking? Authorizing Provider  acetaminophen (TYLENOL) 500 MG tablet Take 1,000 mg by mouth every 6 (six) hours as needed.   Yes Historical Provider, MD  acidophilus (RISAQUAD) CAPS capsule Take 1 capsule by mouth daily.   Yes Historical Provider, MD  amitriptyline (ELAVIL) 25 MG tablet Take 25 mg by mouth at bedtime.  05/20/15  Yes Historical Provider, MD  azaTHIOprine (IMURAN) 50 MG tablet Take 225 mg by mouth daily.    Yes Historical Provider, MD  beclomethasone (QVAR) 80 MCG/ACT inhaler Inhale 1 puff into the lungs daily as needed (Shortness of Breath).   Yes Historical Provider, MD  citalopram (CELEXA) 20 MG tablet Take 20 mg by mouth daily.  05/20/15  Yes Historical Provider, MD  cyanocobalamin (,VITAMIN B-12,) 1000 MCG/ML injection Inject 1,000 mcg into the muscle every 30 (thirty) days.   Yes Historical Provider, MD  dicyclomine (BENTYL) 20 MG tablet Take 20 mg by mouth at bedtime.    Yes Historical Provider, MD  Eluxadoline 75 MG TABS Take 1 tablet by mouth daily.   Yes Historical Provider, MD  gabapentin (NEURONTIN) 300 MG capsule Take 300 mg by mouth at bedtime.    Yes Historical Provider, MD  hyoscyamine (LEVSIN, ANASPAZ) 0.125 MG tablet Take 0.125 mg by mouth 2 (two) times daily.    Yes Historical Provider, MD  mesalamine (LIALDA) 1.2 G EC tablet Take 2.4 g by mouth at bedtime.    Yes Historical Provider, MD  ondansetron (ZOFRAN) 4 MG tablet Take 4 mg by mouth every 6 (six) hours as needed for nausea or vomiting.   Yes Historical Provider, MD  predniSONE (DELTASONE) 20  MG tablet Take 2 tablets (40 mg total) by mouth daily with breakfast. 05/24/15  Yes Orpah Greek, MD  albuterol (PROVENTIL HFA;VENTOLIN HFA) 108 (90 BASE) MCG/ACT inhaler Inhale 1-2 puffs into the lungs every 6 (six) hours as needed for wheezing. 05/08/13   Janne Napoleon, NP  cetirizine (ZYRTEC) 10 MG tablet Take 10 mg by mouth daily.    Historical Provider, MD  HYDROcodone-acetaminophen (NORCO/VICODIN) 5-325 MG per tablet Take 1-2 tablets by mouth every 4 (four) hours as needed for moderate pain. 05/24/15   Orpah Greek, MD   BP 143/89 mmHg  Pulse 83  Temp(Src)  98 F (36.7 C) (Oral)  Resp 16  Ht 5' 8.5" (1.74 m)  Wt 200 lb (90.719 kg)  BMI 29.96 kg/m2  SpO2 98% Physical Exam  Constitutional: She is oriented to person, place, and time. She appears well-developed and well-nourished. No distress.  tearful  HENT:  Head: Normocephalic and atraumatic.  Mouth/Throat: Oropharynx is clear and moist. No oropharyngeal exudate.  Eyes: Conjunctivae and EOM are normal. Pupils are equal, round, and reactive to light.  Neck: Normal range of motion. Neck supple.  No meningismus.  Cardiovascular: Normal rate, regular rhythm, normal heart sounds and intact distal pulses.   No murmur heard. Pulmonary/Chest: Effort normal and breath sounds normal. No respiratory distress.  Abdominal: Soft. There is tenderness. There is no rebound and no guarding.  Diffusely tender, no guarding or rebound  Musculoskeletal: Normal range of motion. She exhibits no edema or tenderness.  No CVAT  Neurological: She is alert and oriented to person, place, and time. No cranial nerve deficit. She exhibits normal muscle tone. Coordination normal.  No ataxia on finger to nose bilaterally. No pronator drift. 5/5 strength throughout. CN 2-12 intact.Equal grip strength. Sensation intact.   Skin: Skin is warm.  Psychiatric: She has a normal mood and affect. Her behavior is normal.  Nursing note and vitals reviewed.   ED  Course  Procedures (including critical care time) Labs Review Labs Reviewed  COMPREHENSIVE METABOLIC PANEL - Abnormal; Notable for the following:    Glucose, Bld 125 (*)    All other components within normal limits  URINALYSIS, ROUTINE W REFLEX MICROSCOPIC (NOT AT [facility]) - Abnormal; Notable for the following:    APPearance HAZY (*)    All other components within normal limits  CBC WITH DIFFERENTIAL/PLATELET  LIPASE, BLOOD    Imaging Review Ct Abdomen Pelvis W Contrast  08/08/2015  CLINICAL DATA:  Sharp intermittent abdominal pain and headache for 1 week. Diarrhea. Recently diagnosed with ulcerative colitis. EXAM: CT ABDOMEN AND PELVIS WITH CONTRAST TECHNIQUE: Multidetector CT imaging of the abdomen and pelvis was performed using the standard protocol following bolus administration of intravenous contrast. CONTRAST:  160m OMNIPAQUE IOHEXOL 300 MG/ML  SOLN COMPARISON:  CT abdomen pelvis dated 05/28/2015. FINDINGS: Lower chest:  No acute findings. Hepatobiliary: 10 mm hypoenhancing lesion again identified within the medial segment of the left liver lobe, unchanged compared to earlier CT of 11/18/2013, most likely a benign cyst or hemangioma. Liver otherwise unremarkable. Status post cholecystectomy with associated mild bile duct ectasia. Pancreas: No mass, inflammatory changes, or other significant abnormality. Spleen: Within normal limits in size and appearance. Adrenals/Urinary Tract: Adrenal glands appear normal. Small right renal cysts are unchanged in the short-term interval. Left kidney appears normal. No renal stone or hydronephrosis bilaterally. No ureteral or bladder calculi identified. Bladder is unremarkable. Stomach/Bowel: Stomach appears normal. Small bowel is normal in caliber and configuration throughout. Large bowel is also normal in caliber and configuration throughout. No evidence of bowel wall thickening or bowel wall inflammation on today's exam. The thickening/ inflammation of the  rectosigmoid colon, identified on the CT of 05/28/2015, appears to have resolved. Appendix is normal. Vascular/Lymphatic: No pathologically enlarged lymph nodes. No evidence of abdominal aortic aneurysm. Reproductive: Status post hysterectomy. Adnexal regions are unremarkable. Other: No free fluid or abscess collection seen. No free intraperitoneal air. Musculoskeletal: Mild degenerative change within the slightly scoliotic thoracolumbar spine. No acute osseous abnormality. Superficial soft tissues are unremarkable. IMPRESSION: 1. No evidence of acute intra-abdominal or intrapelvic abnormality. No bowel obstruction or evidence  of bowel wall inflammation. The appearance of rectal wall thickening seen on CT of 05/27/2005 has resolved. There is now no evidence of a bowel wall thickening or bowel wall inflammation. No evidence of acute solid organ abnormality. Appendix is normal. 2. Stable chronic/incidental findings detailed above. Electronically Signed   By: Franki Cabot M.D.   On: 08/08/2015 07:47   I have personally reviewed and evaluated these images and lab results as part of my medical decision-making.   EKG Interpretation None      MDM   Final diagnoses:  Abdominal pain, unspecified abdominal location  Diarrhea, unspecified type   History of ulcerative colitis with worsening pain as well as frequent diarrhea over the past 2 days. Diarrhea is nonbloody. No fever.  Abdomen soft without peritoneal signs. Labs unremarkable, UA negative.  CT without active colitis or other acute finding.  Previous areas of colitis have resolved.  Tolerating po in the ED.  BP has improved.   Patient admits to being under a lot of stress lately. Continue steroid taper and other UC meds.  Follow up with Dr. Benson Norway. Return precautions discussed.    Ezequiel Essex, MD 08/09/15 [phone]

## 2015-08-08 NOTE — ED Notes (Addendum)
Patient given ginger ale to drink. EDP at bedside.

## 2015-08-08 NOTE — ED Notes (Signed)
C/o abdominal pain and headache X1 week. Patient recently diagnosed with ulcerative colitis and is being treated.Diarrhea X5 in 24 hours. Nausea, but no emesis.

## 2015-08-08 NOTE — ED Notes (Signed)
Pt. Reports vomiting after drinking ginger ale.

## 2015-08-08 NOTE — Discharge Instructions (Signed)
Abdominal Pain, Adult Continue your prednisone taper and other UC medications. Follow up with Dr. Benson Norway. Return to the ED if you develop new or worsening symptoms. Many things can cause abdominal pain. Usually, abdominal pain is not caused by a disease and will improve without treatment. It can often be observed and treated at home. Your health care provider will do a physical exam and possibly order blood tests and X-rays to help determine the seriousness of your pain. However, in many cases, more time must pass before a clear cause of the pain can be found. Before that point, your health care provider may not know if you need more testing or further treatment. HOME CARE INSTRUCTIONS Monitor your abdominal pain for any changes. The following actions may help to alleviate any discomfort you are experiencing:  Only take over-the-counter or prescription medicines as directed by your health care provider.  Do not take laxatives unless directed to do so by your health care provider.  Try a clear liquid diet (broth, tea, or water) as directed by your health care provider. Slowly move to a bland diet as tolerated. SEEK MEDICAL CARE IF:  You have unexplained abdominal pain.  You have abdominal pain associated with nausea or diarrhea.  You have pain when you urinate or have a bowel movement.  You experience abdominal pain that wakes you in the night.  You have abdominal pain that is worsened or improved by eating food.  You have abdominal pain that is worsened with eating fatty foods.  You have a fever. SEEK IMMEDIATE MEDICAL CARE IF:  Your pain does not go away within 2 hours.  You keep throwing up (vomiting).  Your pain is felt only in portions of the abdomen, such as the right side or the left lower portion of the abdomen.  You pass bloody or black tarry stools. MAKE SURE YOU:  Understand these instructions.  Will watch your condition.  Will get help right away if you are not doing  well or get worse.   This information is not intended to replace advice given to you by your health care provider. Make sure you discuss any questions you have with your health care provider.   Document Released: 06/01/2005 Document Revised: 05/13/2015 Document Reviewed: 05/01/2013 Elsevier Interactive Patient Education [employer].

## 2015-09-30 MED FILL — AMITRIPTYLINE HCL 25 MG TAB: 25 | 30 days supply | Qty: 30 | Fill #0

## 2015-11-16 MED FILL — GABAPENTIN 300 MG CAPSULE: 300 | 30 days supply | Qty: 90 | Fill #2

## 2015-11-16 MED FILL — OSCIMIN 0.125 MG TABLET: 0.125 | 30 days supply | Qty: 120 | Fill #5

## 2015-11-16 MED FILL — AMITRIPTYLINE HCL 25 MG TAB: 25 | 30 days supply | Qty: 30 | Fill #1

## 2015-11-16 MED FILL — LOSARTAN POTASSIUM 25 MG TA: 25 | 30 days supply | Qty: 30 | Fill #0

## 2015-12-21 MED FILL — AMITRIPTYLINE HCL 25 MG TAB: 25 | 30 days supply | Qty: 30 | Fill #2

## 2015-12-21 MED FILL — LOSARTAN POTASSIUM 25 MG TA: 25 | 30 days supply | Qty: 30 | Fill #1

## 2015-12-21 MED FILL — CITALOPRAM HBR 20 MG TABLET: 20 | 30 days supply | Qty: 30 | Fill #5

## 2015-12-21 MED FILL — GABAPENTIN 300 MG CAPSULE: 300 | 30 days supply | Qty: 90 | Fill #3

## 2015-12-30 MED FILL — QVAR 80 MCG ORAL INHALER: 80 | 30 days supply | Qty: 9 | Fill #0

## 2015-12-30 MED FILL — PROAIR HFA 90 MCG INHALER: 108 (90 BAS | 17 days supply | Qty: 9 | Fill #0

## 2016-01-14 MED FILL — LOSARTAN POTASSIUM 25 MG TA: 25 | 30 days supply | Qty: 30 | Fill #0

## 2016-01-14 MED FILL — AMITRIPTYLINE HCL 25 MG TAB: 25 | 30 days supply | Qty: 30 | Fill #3

## 2016-01-14 MED FILL — GABAPENTIN 300 MG CAPSULE: 300 | 30 days supply | Qty: 90 | Fill #4

## 2016-01-14 MED FILL — PROAIR HFA 90 MCG INHALER: 108 (90 BAS | 17 days supply | Qty: 9 | Fill #1

## 2016-02-08 MED FILL — LOSARTAN POTASSIUM 25 MG TA: 25 | 30 days supply | Qty: 30 | Fill #1

## 2016-02-08 MED FILL — ONDANSETRON HCL 4 MG TABLET: 4 | 30 days supply | Qty: 90 | Fill #4

## 2016-02-08 MED FILL — QVAR 80 MCG ORAL INHALER: 80 | 30 days supply | Qty: 9 | Fill #1

## 2016-02-08 MED FILL — PROAIR HFA 90 MCG INHALER: 108 (90 BAS | 20 days supply | Qty: 9 | Fill #2

## 2016-02-08 MED FILL — GABAPENTIN 300 MG CAPSULE: 300 | 30 days supply | Qty: 90 | Fill #5

## 2016-02-08 MED FILL — AMITRIPTYLINE HCL 25 MG TAB: 25 | 30 days supply | Qty: 30 | Fill #4

## 2016-03-03 ENCOUNTER — Other Ambulatory Visit (HOSPITAL_COMMUNITY): Payer: Self-pay | Admitting: *Deleted

## 2016-03-03 MED FILL — AMITRIPTYLINE HCL 25 MG TAB: 25 | 30 days supply | Qty: 30 | Fill #5

## 2016-03-03 MED FILL — LOSARTAN POTASSIUM 25 MG TA: 25 | 30 days supply | Qty: 30 | Fill #2

## 2016-03-03 MED FILL — QVAR 80 MCG ORAL INHALER: 80 | 30 days supply | Qty: 9 | Fill #2

## 2016-03-03 MED FILL — ONDANSETRON HCL 4 MG TABLET: 4 | 30 days supply | Qty: 90 | Fill #5

## 2016-03-04 ENCOUNTER — Encounter (HOSPITAL_COMMUNITY)
Admission: RE | Admit: 2016-03-04 | Discharge: 2016-03-04 | Disposition: A | Discharge status: Home / Self Care | Payer: BLUE CROSS/BLUE SHIELD | Source: Ambulatory Visit | Attending: Gastroenterology | Admitting: Gastroenterology

## 2016-03-04 DIAGNOSIS — K5190 Ulcerative colitis, unspecified, without complications: Secondary | ICD-10-CM | POA: Insufficient documentation

## 2016-03-04 MED ORDER — SODIUM CHLORIDE 0.9 % IV SOLN
INTRAVENOUS | Status: DC
  Administered 2016-03-04: 09:00:00 via INTRAVENOUS

## 2016-03-04 MED ORDER — SODIUM CHLORIDE 0.9 % IV SOLN
8.0000 mg | Freq: Once | INTRAVENOUS | Status: DC
  Administered 2016-03-04: 4 mg via INTRAVENOUS

## 2016-03-04 MED ORDER — INFLIXIMAB 100 MG IV SOLR
5.0000 mg/kg | INTRAVENOUS | Status: DC
  Administered 2016-03-04: 400 mg via INTRAVENOUS
  Filled 2016-03-04: qty 40

## 2016-03-04 MED ORDER — ONDANSETRON HCL 4 MG/2ML IJ SOLN: 4.0000 mg | Freq: Once | INTRAMUSCULAR | Status: DC | PRN

## 2016-03-04 MED ORDER — ONDANSETRON HCL 4 MG/2ML IJ SOLN
INTRAMUSCULAR | Status: AC
  Filled 2016-03-04: qty 2

## 2016-03-04 NOTE — Discharge Instructions (Signed)
Infliximab injection What is this medicine? INFLIXIMAB (in [place] mab) is used to treat Crohn's disease and ulcerative colitis. It is also used to treat ankylosing spondylitis, psoriasis, and some forms of arthritis. This medicine may be used for other purposes; ask your health care provider or pharmacist if you have questions. What should I tell my health care provider before I take this medicine? They need to know if you have any of these conditions: -diabetes -exposure to tuberculosis -heart failure -hepatitis or liver disease -immune system problems -infection -lung or breathing disease, like COPD -multiple sclerosis -current or past resident of [place] or [place] -seizure disorder -an unusual or allergic reaction to infliximab, mouse proteins, other medicines, foods, dyes, or preservatives -pregnant or trying to get pregnant -breast-feeding How should I use this medicine? This medicine is for injection into a vein. It is usually given by a health care professional in a hospital or clinic setting. A special MedGuide will be given to you by the pharmacist with each prescription and refill. Be sure to read this information carefully each time. Talk to your pediatrician regarding the use of this medicine in children. Special care may be needed. Overdosage: If you think you have taken too much of this medicine contact a poison control center or emergency room at once. NOTE: This medicine is only for you. Do not share this medicine with others. What if I miss a dose? It is important not to miss your dose. Call your doctor or health care professional if you are unable to keep an appointment. What may interact with this medicine? Do not take this medicine with any of the following medications: -anakinra -rilonacept This medicine may also interact with the following medications: -vaccines This list may not describe all possible interactions. Give your health care provider  a list of all the medicines, herbs, non-prescription drugs, or dietary supplements you use. Also tell them if you smoke, drink alcohol, or use illegal drugs. Some items may interact with your medicine. What should I watch for while using this medicine? Visit your doctor or health care professional for regular checks on your progress. If you get a cold or other infection while receiving this medicine, call your doctor or health care professional. Do not treat yourself. This medicine may decrease your body's ability to fight infections. Before beginning therapy, your doctor may do a test to see if you have been exposed to tuberculosis. This medicine may make the symptoms of heart failure worse in some patients. If you notice symptoms such as increased shortness of breath or swelling of the ankles or legs, contact your health care provider right away. If you are going to have surgery or dental work, tell your health care professional or dentist that you have received this medicine. If you take this medicine for plaque psoriasis, stay out of the sun. If you cannot avoid being in the sun, wear protective clothing and use sunscreen. Do not use sun lamps or tanning beds/booths. What side effects may I notice from receiving this medicine? Side effects that you should report to your doctor or health care professional as soon as possible: -allergic reactions like skin rash, itching or hives, swelling of the face, lips, or tongue -chest pain -fever or chills, usually related to the infusion -muscle or joint pain -red, scaly patches or raised bumps on the skin -signs of infection - fever or chills, cough, sore throat, pain or difficulty passing urine -swollen lymph nodes in the neck, underarm,  or groin areas -unexplained weight loss -unusual bleeding or bruising -unusually weak or tired -yellowing of the eyes or skin Side effects that usually do not require medical attention (report to your doctor or health  care professional if they continue or are bothersome): -headache -heartburn or stomach pain -nausea, vomiting This list may not describe all possible side effects. Call your doctor for medical advice about side effects. You may report side effects to FDA at 1-800-FDA-1088. Where should I keep my medicine? This drug is given in a hospital or clinic and will not be stored at home. NOTE: This sheet is a summary. It may not cover all possible information. If you have questions about this medicine, talk to your doctor, pharmacist, or health care provider.    2016, Elsevier/Gold Standard. (2008-04-09 10:26:02)

## 2016-03-11 ENCOUNTER — Encounter (HOSPITAL_COMMUNITY)
Admission: RE | Admit: 2016-03-11 | Discharge: 2016-03-11 | Disposition: A | Discharge status: Home / Self Care | Payer: BLUE CROSS/BLUE SHIELD | Source: Ambulatory Visit | Attending: Gastroenterology | Admitting: Gastroenterology

## 2016-03-11 DIAGNOSIS — K5190 Ulcerative colitis, unspecified, without complications: Secondary | ICD-10-CM | POA: Insufficient documentation

## 2016-03-11 MED ORDER — SODIUM CHLORIDE 0.9 % IV SOLN
5.0000 mg/kg | INTRAVENOUS | Status: AC
  Administered 2016-03-11: 400 mg via INTRAVENOUS
  Filled 2016-03-11: qty 40

## 2016-03-11 MED ORDER — SODIUM CHLORIDE 0.9 % IV SOLN
INTRAVENOUS | Status: AC
  Administered 2016-03-11: 11:00:00 via INTRAVENOUS

## 2016-03-11 MED ORDER — ONDANSETRON HCL 4 MG/2ML IJ SOLN: 4.0000 mg | Freq: Once | INTRAMUSCULAR | Status: DC | PRN

## 2016-03-18 DIAGNOSIS — K5190 Ulcerative colitis, unspecified, without complications: Secondary | ICD-10-CM | POA: Diagnosis not present

## 2016-04-22 ENCOUNTER — Ambulatory Visit (HOSPITAL_COMMUNITY)
Admission: RE | Admit: 2016-04-22 | Discharge: 2016-04-22 | Disposition: A | Discharge status: Home / Self Care | Payer: BLUE CROSS/BLUE SHIELD | Source: Ambulatory Visit | Attending: Gastroenterology | Admitting: Gastroenterology

## 2016-04-22 ENCOUNTER — Encounter (HOSPITAL_COMMUNITY): Payer: Self-pay

## 2016-04-22 DIAGNOSIS — K5190 Ulcerative colitis, unspecified, without complications: Secondary | ICD-10-CM | POA: Insufficient documentation

## 2016-04-22 MED ORDER — SODIUM CHLORIDE 0.9 % IV SOLN
INTRAVENOUS | Status: AC
  Administered 2016-04-22: 10:00:00 via INTRAVENOUS

## 2016-04-22 MED ORDER — SODIUM CHLORIDE 0.9 % IV SOLN
5.0000 mg/kg | INTRAVENOUS | Status: AC
  Administered 2016-04-22: 400 mg via INTRAVENOUS
  Filled 2016-04-22: qty 40

## 2016-04-22 MED ORDER — ONDANSETRON HCL 4 MG/2ML IJ SOLN: 4.0000 mg | Freq: Once | INTRAMUSCULAR | Status: DC | PRN

## 2016-04-22 MED FILL — LOSARTAN POTASSIUM 25 MG TA: 25 | 30 days supply | Qty: 30 | Fill #3

## 2016-04-22 MED FILL — AMITRIPTYLINE HCL 25 MG TAB: 25 | 30 days supply | Qty: 30 | Fill #6

## 2016-05-18 NOTE — Addendum Note (Signed)
Encounter addended by: Landis Martins, RN on: 05/18/2016  9:07 PM<BR>    Actions taken: Charge Capture section accepted

## 2016-06-06 MED FILL — AMITRIPTYLINE HCL 25 MG TAB: 25 | 30 days supply | Qty: 30 | Fill #7

## 2016-06-06 MED FILL — traMADol HCL 50 MG TABS: 50 | 30 days supply | Qty: 90 | Fill #0

## 2016-06-16 ENCOUNTER — Other Ambulatory Visit (HOSPITAL_COMMUNITY): Payer: Self-pay | Admitting: *Deleted

## 2016-06-17 ENCOUNTER — Ambulatory Visit (HOSPITAL_COMMUNITY): Admission: RE | Admit: 2016-06-17 | Payer: BLUE CROSS/BLUE SHIELD | Source: Ambulatory Visit

## 2016-06-30 ENCOUNTER — Other Ambulatory Visit (HOSPITAL_COMMUNITY): Payer: Self-pay | Admitting: *Deleted

## 2016-07-01 ENCOUNTER — Ambulatory Visit (HOSPITAL_COMMUNITY)
Admission: RE | Admit: 2016-07-01 | Discharge: 2016-07-01 | Disposition: A | Discharge status: Home / Self Care | Payer: BLUE CROSS/BLUE SHIELD | Source: Ambulatory Visit | Attending: Gastroenterology | Admitting: Gastroenterology

## 2016-07-01 DIAGNOSIS — K5190 Ulcerative colitis, unspecified, without complications: Secondary | ICD-10-CM | POA: Insufficient documentation

## 2016-07-01 MED ORDER — SODIUM CHLORIDE 0.9 % IV SOLN
INTRAVENOUS | Status: DC
Start: 2016-07-01 — End: 2016-07-02
  Administered 2016-07-01: 10:00:00 via INTRAVENOUS

## 2016-07-01 MED ORDER — SODIUM CHLORIDE 0.9 % IV SOLN
5.0000 mg/kg | INTRAVENOUS | Status: DC
Start: 2016-07-01 — End: 2016-07-02
  Administered 2016-07-01: 400 mg via INTRAVENOUS
  Filled 2016-07-01: qty 40

## 2016-08-03 MED FILL — GABAPENTIN 300 MG CAPSULE: 300 | 90 days supply | Qty: 180 | Fill #0

## 2016-08-03 MED FILL — LOSARTAN POTASSIUM 25 MG TA: 25 | 30 days supply | Qty: 30 | Fill #0

## 2016-08-03 MED FILL — AMITRIPTYLINE HCL 25 MG TAB: 25 | 30 days supply | Qty: 30 | Fill #8

## 2016-08-03 MED FILL — OSCIMIN 0.125 MG TABLET: 0.125 | 30 days supply | Qty: 120 | Fill #0

## 2016-08-25 ENCOUNTER — Other Ambulatory Visit (HOSPITAL_COMMUNITY): Payer: Self-pay | Admitting: *Deleted

## 2016-08-26 ENCOUNTER — Ambulatory Visit (HOSPITAL_COMMUNITY)
Admission: RE | Admit: 2016-08-26 | Discharge: 2016-08-26 | Disposition: A | Discharge status: Home / Self Care | Payer: BLUE CROSS/BLUE SHIELD | Source: Ambulatory Visit | Attending: Gastroenterology | Admitting: Gastroenterology

## 2016-08-26 DIAGNOSIS — K5190 Ulcerative colitis, unspecified, without complications: Secondary | ICD-10-CM | POA: Diagnosis present

## 2016-08-26 MED ORDER — SODIUM CHLORIDE 0.9 % IV SOLN: INTRAVENOUS | Status: DC

## 2016-08-26 MED ORDER — SODIUM CHLORIDE 0.9 % IV SOLN
5.0000 mg/kg | INTRAVENOUS | Status: AC
Start: 2016-08-26 — End: 2016-08-26
  Administered 2016-08-26: 400 mg via INTRAVENOUS
  Filled 2016-08-26: qty 40

## 2016-09-20 ENCOUNTER — Other Ambulatory Visit: Payer: Self-pay | Admitting: Internal Medicine

## 2016-09-20 DIAGNOSIS — Z1231 Encounter for screening mammogram for malignant neoplasm of breast: Secondary | ICD-10-CM

## 2016-09-20 DIAGNOSIS — Z853 Personal history of malignant neoplasm of breast: Secondary | ICD-10-CM

## 2016-09-26 MED FILL — LOSARTAN POTASSIUM 25 MG TA: 25 | 30 days supply | Qty: 30 | Fill #1

## 2016-09-26 MED FILL — traMADol HCL 50 MG TABS: 50 | 30 days supply | Qty: 90 | Fill #0

## 2016-09-26 MED FILL — AMITRIPTYLINE HCL 25 MG TAB: 25 | 30 days supply | Qty: 30 | Fill #9

## 2016-09-29 ENCOUNTER — Ambulatory Visit
Admission: RE | Admit: 2016-09-29 | Discharge: 2016-09-29 | Disposition: A | Discharge status: Home / Self Care | Payer: BLUE CROSS/BLUE SHIELD | Source: Ambulatory Visit | Attending: Internal Medicine | Admitting: Internal Medicine

## 2016-09-29 DIAGNOSIS — Z853 Personal history of malignant neoplasm of breast: Secondary | ICD-10-CM

## 2016-09-29 DIAGNOSIS — Z1231 Encounter for screening mammogram for malignant neoplasm of breast: Secondary | ICD-10-CM

## 2016-10-27 ENCOUNTER — Other Ambulatory Visit (HOSPITAL_COMMUNITY): Payer: Self-pay | Admitting: *Deleted

## 2016-10-27 MED FILL — LOSARTAN POTASSIUM 25 MG TA: 25 | 30 days supply | Qty: 30 | Fill #2

## 2016-10-28 ENCOUNTER — Ambulatory Visit (HOSPITAL_COMMUNITY)
Admission: RE | Admit: 2016-10-28 | Discharge: 2016-10-28 | Disposition: A | Discharge status: Home / Self Care | Payer: BLUE CROSS/BLUE SHIELD | Source: Ambulatory Visit | Attending: Gastroenterology | Admitting: Gastroenterology

## 2016-10-28 DIAGNOSIS — K5190 Ulcerative colitis, unspecified, without complications: Secondary | ICD-10-CM | POA: Diagnosis present

## 2016-10-28 MED ORDER — SODIUM CHLORIDE 0.9 % IV SOLN
INTRAVENOUS | Status: DC
  Administered 2016-10-28: 10:00:00 via INTRAVENOUS

## 2016-10-28 MED ORDER — INFLIXIMAB 100 MG IV SOLR
5.0000 mg/kg | INTRAVENOUS | Status: DC
Start: 2016-10-28 — End: 2016-10-29
  Administered 2016-10-28: 400 mg via INTRAVENOUS
  Filled 2016-10-28: qty 40

## 2016-10-29 ENCOUNTER — Emergency Department (HOSPITAL_COMMUNITY): Payer: BLUE CROSS/BLUE SHIELD

## 2016-10-29 ENCOUNTER — Emergency Department (HOSPITAL_COMMUNITY)
Admission: EM | Admit: 2016-10-29 | Discharge: 2016-10-29 | Disposition: A | Discharge status: Home / Self Care | Payer: BLUE CROSS/BLUE SHIELD | Attending: Emergency Medicine | Admitting: Emergency Medicine

## 2016-10-29 ENCOUNTER — Encounter (HOSPITAL_COMMUNITY): Payer: Self-pay | Admitting: Emergency Medicine

## 2016-10-29 DIAGNOSIS — J45909 Unspecified asthma, uncomplicated: Secondary | ICD-10-CM | POA: Insufficient documentation

## 2016-10-29 DIAGNOSIS — Z853 Personal history of malignant neoplasm of breast: Secondary | ICD-10-CM | POA: Diagnosis not present

## 2016-10-29 DIAGNOSIS — S8991XA Unspecified injury of right lower leg, initial encounter: Secondary | ICD-10-CM | POA: Diagnosis present

## 2016-10-29 DIAGNOSIS — Y929 Unspecified place or not applicable: Secondary | ICD-10-CM | POA: Diagnosis not present

## 2016-10-29 DIAGNOSIS — Y939 Activity, unspecified: Secondary | ICD-10-CM | POA: Diagnosis not present

## 2016-10-29 DIAGNOSIS — Y999 Unspecified external cause status: Secondary | ICD-10-CM | POA: Diagnosis not present

## 2016-10-29 DIAGNOSIS — W1839XA Other fall on same level, initial encounter: Secondary | ICD-10-CM | POA: Diagnosis not present

## 2016-10-29 DIAGNOSIS — S8001XA Contusion of right knee, initial encounter: Secondary | ICD-10-CM | POA: Insufficient documentation

## 2016-10-29 DIAGNOSIS — Z79899 Other long term (current) drug therapy: Secondary | ICD-10-CM | POA: Insufficient documentation

## 2016-10-29 MED ORDER — HYDROCODONE-ACETAMINOPHEN 5-325 MG PO TABS: 1.0000 | ORAL_TABLET | ORAL | 0 refills | Status: DC | PRN

## 2016-10-29 MED ORDER — HYDROCODONE-ACETAMINOPHEN 5-325 MG PO TABS
1.0000 | ORAL_TABLET | Freq: Once | ORAL | Status: AC
  Administered 2016-10-29: 1 via ORAL
  Filled 2016-10-29: qty 1

## 2016-10-29 NOTE — Discharge Instructions (Signed)
Wear the knee sleeve and use crutches until you are more comfortably able to weight bear.  Use ice and elevation as much as possible for the next 2 days to help reduce the swelling, you may also add a heating pad treatment 20 minutes 3 times daily starting Tuesday.  Take the medications prescribed.  You may take the hydrocodone prescribed for pain relief.  This will make you drowsy - do not drive within 4 hours of taking this medication.

## 2016-10-29 NOTE — ED Triage Notes (Signed)
Pt reports falling on concrete this morning. Pt c/o RT knee pain. Pt unable to ambulate.

## 2016-10-31 NOTE — ED Provider Notes (Signed)
[facility] DEPT Provider Note   CSN: [id number] Arrival date & time: 10/29/16  1207     History   Chief Complaint Chief Complaint  Patient presents with  . Fall    HPI Gabrielle Hawkins is a 51 y.o. female.  The history is provided by the patient.  Knee Pain   This is a new problem. The current episode started 1 to 2 hours ago. The problem occurs constantly. The problem has not changed since onset.The pain is present in the right knee. The quality of the pain is described as sharp. The pain is at a severity of 4/10. The pain is moderate. Associated symptoms include limited range of motion. Pertinent negatives include no numbness. The symptoms are aggravated by standing. She has tried nothing for the symptoms. There has been a history of trauma (pt tripped and fell, landing on her right knee).    Past Medical History:  Diagnosis Date  . Anemia 1998   . Anxiety   . Arthritis    big toe   . Asthma   . BRCA1 negative   . BRCA2 negative   . Breast cancer ([place]) 2006   chem/radiation/2years tamoxifen  . Chronic neck and back pain   . Depression   . Endometriosis   . Fibroid   . GERD (gastroesophageal reflux disease)   . History of chemotherapy 01/2005  . Hx of tamoxifen therapy   . Hx of transfusion of packed red blood cells   . IBS (irritable bowel syndrome)   . Kidney stones   . Lymphedema of arm   . Migraines    just at dx of breast CA  . Mucinous cystadenoma of ovary    left  . Neuropathy ([place])   . Paresthesias   . Peripheral neuropathy ([place])   . Radiation 04/2005  . Stroke [facility])    related to when taken compazine  . Ulcerative colitis ([place])    with chronic diarrhea  . Urinary tract infection    x 3  . Vertigo 2014  . Vitamin D deficiency     Patient Active Problem List   Diagnosis Date Noted  . Abdominal pain, chronic, right lower quadrant 01/06/2015  . Nausea and vomiting 01/05/2015  . Ulcerative colitis ([place]) 01/05/2015  . Enteritis   . Nausea  with vomiting   . Intractable nausea and vomiting 04/29/2014  . Benign paroxysmal positional vertigo 03/14/2013  . Breast cancer, stage 1 ([place]) 12/30/2011  . Lymphedema of arm 12/30/2011    Past Surgical History:  Procedure Laterality Date  . ABDOMINAL HYSTERECTOMY  2007   TAH/BSO  . BREAST SURGERY  2006   LUMPECTOMY  . CHOLECYSTECTOMY    . CHOLECYSTECTOMY N/A 04/29/2014   Procedure: LAPAROSCOPIC CHOLECYSTECTOMY WITH INTRAOPERATIVE CHOLANGIOGRAM;  Surgeon: Edward Jolly, MD;  Location: [facility] ORS;  Service: General;  Laterality: N/A;  . COLONOSCOPY  June 2013   Dr. Benson Norway: sessile serrated adenoma  . COLONOSCOPY WITH PROPOFOL N/A 03/13/2015   Procedure: COLONOSCOPY WITH PROPOFOL;  Surgeon: [name], MD;  Location: [facility] ENDOSCOPY;  Service: Endoscopy;  Laterality: N/A;  . DILATION AND CURETTAGE OF UTERUS    . FLEXIBLE SIGMOIDOSCOPY N/A 07/18/2014   Dr. Benson Norway: mild left-sided colitis, biopsies with quiescent UC  . MYOMECTOMY    . TONSILLECTOMY      OB History    Gravida Para Term Preterm AB Living   1 0     1 0   SAB TAB Ectopic Multiple Live Births  1               Home Medications    Prior to Admission medications   Medication Sig Start Date End Date Taking? Authorizing Provider  acetaminophen (TYLENOL) 500 MG tablet Take 1,000 mg by mouth every 6 (six) hours as needed.   Yes Historical Provider, MD  beclomethasone (QVAR) 80 MCG/ACT inhaler Inhale 1 puff into the lungs daily as needed (Shortness of Breath).   Yes Historical Provider, MD  cetirizine (ZYRTEC) 10 MG tablet Take 10 mg by mouth daily.   Yes Historical Provider, MD  cyanocobalamin (,VITAMIN B-12,) 1000 MCG/ML injection Inject 1,000 mcg into the muscle every 30 (thirty) days.   Yes Historical Provider, MD  dicyclomine (BENTYL) 20 MG tablet Take 20 mg by mouth at bedtime.    Yes Historical Provider, MD  gabapentin (NEURONTIN) 300 MG capsule Take 300 mg by mouth at bedtime.    Yes Historical Provider, MD    hyoscyamine (LEVSIN, ANASPAZ) 0.125 MG tablet Take 0.125 mg by mouth 2 (two) times daily.    Yes Historical Provider, MD  losartan (COZAAR) 25 MG tablet Take 25 mg by mouth daily.   Yes Historical Provider, MD  ondansetron (ZOFRAN) 4 MG tablet Take 4 mg by mouth every 6 (six) hours as needed for nausea or vomiting.   Yes Historical Provider, MD  traMADol (ULTRAM) 50 MG tablet Take 50 mg by mouth every 6 (six) hours as needed.   Yes Historical Provider, MD  amitriptyline (ELAVIL) 25 MG tablet Take 25 mg by mouth at bedtime.  05/20/15   Historical Provider, MD  HYDROcodone-acetaminophen (NORCO/VICODIN) 5-325 MG tablet Take 1 tablet by mouth every 4 (four) hours as needed. 10/29/16   Evalee Jefferson, PA-C    Family History Family History  Problem Relation Age of Onset  . Breast cancer Mother 44  . Heart failure Father   . Heart disease Father   . Cancer Maternal Grandmother     COLON  . Heart failure Paternal Grandmother   . Heart disease Paternal Grandmother     Social History Social History  Substance Use Topics  . Smoking status: Never Smoker  . Smokeless tobacco: Never Used  . Alcohol use No     Allergies   Ampicillin; Compazine [prochlorperazine maleate]; Cymbalta [duloxetine hcl]; Imitrex [sumatriptan base]; Lyrica [pregabalin]; Metoclopramide hcl; Percocet [oxycodone-acetaminophen]; and Effexor [venlafaxine hydrochloride]   Review of Systems Review of Systems  Constitutional: Negative for fever.  Musculoskeletal: Positive for arthralgias and joint swelling. Negative for myalgias.  Neurological: Negative for weakness and numbness.     Physical Exam Updated Vital Signs BP 141/86 (BP Location: Left Arm)   Pulse 84   Temp 97.9 F (36.6 C) (Oral)   Resp 18   Ht 5' 8.5" (1.74 m)   Wt 88.9 kg   SpO2 100%   BMI 29.37 kg/m   Physical Exam  Constitutional: She appears well-developed and well-nourished.  HENT:  Head: Atraumatic.  Neck: Normal range of motion.   Cardiovascular:  Pulses equal bilaterally  Musculoskeletal: She exhibits tenderness.       Right knee: She exhibits swelling. She exhibits normal range of motion, no effusion, no ecchymosis, no deformity, no erythema, normal alignment, no LCL laxity, normal meniscus and no MCL laxity. Tenderness found. Patellar tendon tenderness noted.  ttp right patella and patellar tendon. No deformity.  Pt can SLR holding knee in extension.  Neurological: She is alert. She has normal strength. She displays normal reflexes. No sensory  deficit.  Skin: Skin is warm and dry.  Psychiatric: She has a normal mood and affect.     ED Treatments / Results  Labs (all labs ordered are listed, but only abnormal results are displayed) Labs Reviewed - No data to display  EKG  EKG Interpretation None       Radiology  EXAM:  RIGHT KNEE - COMPLETE 4+ VIEW    COMPARISON: None.    FINDINGS:  No evidence of fracture, dislocation, or joint effusion. No evidence  of arthropathy or other focal bone abnormality. Soft tissues are  unremarkable.    IMPRESSION:  Negative.      Electronically Signed  By: Earle Gell M.D.  On: 10/29/2016 13:50     Procedures Procedures (including critical care time)  Medications Ordered in ED Medications  HYDROcodone-acetaminophen (NORCO/VICODIN) 5-325 MG per tablet 1 tablet (1 tablet Oral Given 10/29/16 1620)     Initial Impression / Assessment and Plan / ED Course  I have reviewed the triage vital signs and the nursing notes.  Pertinent labs & imaging results that were available during my care of the patient were reviewed by me and considered in my medical decision making (see chart for details).     Exam and negative images suggests patellar contusion. Knee sleeve, crutches prn, RICE, ice/heat tx discussed.  Prn f/u if sx persist.  Final Clinical Impressions(s) / ED Diagnoses   Final diagnoses:  Contusion of right knee, initial encounter    New  Prescriptions Discharge Medication List as of 10/29/2016  4:36 PM       Evalee Jefferson, PA-C 10/31/16 2325    Milton Ferguson, MD 11/01/16 1630

## 2016-11-07 MED FILL — AMITRIPTYLINE HCL 25 MG TAB: 25 | 30 days supply | Qty: 30 | Fill #0

## 2016-11-25 ENCOUNTER — Encounter (HOSPITAL_COMMUNITY)
Admission: RE | Admit: 2016-11-25 | Discharge: 2016-11-25 | Disposition: A | Discharge status: Home / Self Care | Payer: BLUE CROSS/BLUE SHIELD | Source: Ambulatory Visit | Attending: Gastroenterology | Admitting: Gastroenterology

## 2016-11-25 DIAGNOSIS — K5190 Ulcerative colitis, unspecified, without complications: Secondary | ICD-10-CM | POA: Diagnosis present

## 2016-11-25 MED ORDER — INFLIXIMAB 100 MG IV SOLR
5.0000 mg/kg | INTRAVENOUS | Status: DC
Start: 2016-11-25 — End: 2016-11-26
  Administered 2016-11-25: 400 mg via INTRAVENOUS
  Filled 2016-11-25: qty 40

## 2016-11-25 MED ORDER — SODIUM CHLORIDE 0.9 % IV SOLN
INTRAVENOUS | Status: DC
Start: 2016-11-25 — End: 2016-11-26
  Administered 2016-11-25: 10:00:00 via INTRAVENOUS

## 2016-12-21 MED FILL — AMITRIPTYLINE HCL 25 MG TAB: 25 | 30 days supply | Qty: 30 | Fill #1

## 2016-12-21 MED FILL — LOSARTAN POTASSIUM 25 MG TA: 25 | 30 days supply | Qty: 30 | Fill #3

## 2016-12-21 MED FILL — GABAPENTIN 300 MG CAPSULE: 300 | 90 days supply | Qty: 180 | Fill #1

## 2016-12-23 ENCOUNTER — Ambulatory Visit (HOSPITAL_COMMUNITY)
Admission: RE | Admit: 2016-12-23 | Discharge: 2016-12-23 | Disposition: A | Discharge status: Home / Self Care | Payer: BLUE CROSS/BLUE SHIELD | Source: Ambulatory Visit | Attending: Gastroenterology | Admitting: Gastroenterology

## 2016-12-23 DIAGNOSIS — K5190 Ulcerative colitis, unspecified, without complications: Secondary | ICD-10-CM | POA: Diagnosis not present

## 2016-12-23 MED ORDER — SODIUM CHLORIDE 0.9 % IV SOLN
INTRAVENOUS | Status: DC
  Administered 2016-12-23: 10:00:00 via INTRAVENOUS

## 2016-12-23 MED ORDER — SODIUM CHLORIDE 0.9 % IV SOLN
5.0000 mg/kg | INTRAVENOUS | Status: DC
  Administered 2016-12-23: 400 mg via INTRAVENOUS
  Filled 2016-12-23: qty 40

## 2017-01-04 MED FILL — CYANOCOBALAMIN 1,000 MCG/ML: 1000 | 84 days supply | Qty: 3 | Fill #0

## 2017-01-20 ENCOUNTER — Ambulatory Visit (HOSPITAL_COMMUNITY)
Admission: RE | Admit: 2017-01-20 | Discharge: 2017-01-20 | Disposition: A | Discharge status: Home / Self Care | Payer: BLUE CROSS/BLUE SHIELD | Source: Ambulatory Visit | Attending: Gastroenterology | Admitting: Gastroenterology

## 2017-01-20 DIAGNOSIS — K5190 Ulcerative colitis, unspecified, without complications: Secondary | ICD-10-CM | POA: Diagnosis present

## 2017-01-20 MED ORDER — SODIUM CHLORIDE 0.9 % IV SOLN
5.0000 mg/kg | Freq: Once | INTRAVENOUS | Status: AC
  Administered 2017-01-20: 400 mg via INTRAVENOUS
  Filled 2017-01-20: qty 40

## 2017-01-20 MED ORDER — SODIUM CHLORIDE 0.9 % IV SOLN
INTRAVENOUS | Status: DC
  Administered 2017-01-20: 10:00:00 via INTRAVENOUS

## 2017-01-20 MED FILL — AMITRIPTYLINE HCL 25 MG TAB: 25 | 30 days supply | Qty: 30 | Fill #2

## 2017-02-14 ENCOUNTER — Emergency Department (HOSPITAL_COMMUNITY)
Admission: EM | Admit: 2017-02-14 | Discharge: 2017-02-14 | Disposition: A | Discharge status: Home / Self Care | Payer: BLUE CROSS/BLUE SHIELD | Attending: Emergency Medicine | Admitting: Emergency Medicine

## 2017-02-14 ENCOUNTER — Encounter (HOSPITAL_COMMUNITY): Payer: Self-pay | Admitting: Emergency Medicine

## 2017-02-14 ENCOUNTER — Other Ambulatory Visit (HOSPITAL_COMMUNITY): Payer: Self-pay | Admitting: *Deleted

## 2017-02-14 DIAGNOSIS — Z853 Personal history of malignant neoplasm of breast: Secondary | ICD-10-CM | POA: Diagnosis not present

## 2017-02-14 DIAGNOSIS — Z79899 Other long term (current) drug therapy: Secondary | ICD-10-CM | POA: Insufficient documentation

## 2017-02-14 DIAGNOSIS — R1030 Lower abdominal pain, unspecified: Secondary | ICD-10-CM | POA: Diagnosis present

## 2017-02-14 DIAGNOSIS — Z79891 Long term (current) use of opiate analgesic: Secondary | ICD-10-CM | POA: Insufficient documentation

## 2017-02-14 DIAGNOSIS — Z791 Long term (current) use of non-steroidal anti-inflammatories (NSAID): Secondary | ICD-10-CM | POA: Diagnosis not present

## 2017-02-14 DIAGNOSIS — R109 Unspecified abdominal pain: Secondary | ICD-10-CM

## 2017-02-14 DIAGNOSIS — J45909 Unspecified asthma, uncomplicated: Secondary | ICD-10-CM | POA: Insufficient documentation

## 2017-02-14 LAB — URINALYSIS, ROUTINE W REFLEX MICROSCOPIC
Bilirubin Urine: NEGATIVE
Glucose, UA: NEGATIVE mg/dL
Hgb urine dipstick: NEGATIVE
Ketones, ur: NEGATIVE mg/dL
Leukocytes, UA: NEGATIVE
Nitrite: NEGATIVE
Protein, ur: NEGATIVE mg/dL
Specific Gravity, Urine: 1.019 (ref 1.005–1.030)
pH: 5 (ref 5.0–8.0)

## 2017-02-14 LAB — COMPREHENSIVE METABOLIC PANEL
ALT: 17 U/L (ref 14–54)
AST: 20 U/L (ref 15–41)
Albumin: 4 g/dL (ref 3.5–5.0)
Alkaline Phosphatase: 81 U/L (ref 38–126)
Anion gap: 8 (ref 5–15)
BUN: 11 mg/dL (ref 6–20)
CO2: 26 mmol/L (ref 22–32)
Calcium: 9.1 mg/dL (ref 8.9–10.3)
Chloride: 102 mmol/L (ref 101–111)
Creatinine, Ser: 0.89 mg/dL (ref 0.44–1.00)
GFR calc Af Amer: >60 mL/min (ref >60)
GFR calc non Af Amer: >60 mL/min (ref >60)
Glucose, Bld: 100 mg/dL — ABNORMAL HIGH (ref 65–99)
Potassium: 3.9 mmol/L (ref 3.5–5.1)
Sodium: 136 mmol/L (ref 135–145)
Total Bilirubin: 0.5 mg/dL (ref 0.3–1.2)
Total Protein: 7.9 g/dL (ref 6.5–8.1)

## 2017-02-14 LAB — CBC WITH DIFFERENTIAL/PLATELET
Basophils Absolute: 0 10*3/uL (ref 0.0–0.1)
Basophils Relative: 1 %
Eosinophils Absolute: 0.4 10*3/uL (ref 0.0–0.7)
Eosinophils Relative: 8 %
HCT: 43 % (ref 36.0–46.0)
Hemoglobin: 14.2 g/dL (ref 12.0–15.0)
Lymphocytes Relative: 45 %
Lymphs Abs: 2.5 10*3/uL (ref 0.7–4.0)
MCH: 29.9 pg (ref 26.0–34.0)
MCHC: 33 g/dL (ref 30.0–36.0)
MCV: 90.5 fL (ref 78.0–100.0)
Monocytes Absolute: 0.3 10*3/uL (ref 0.1–1.0)
Monocytes Relative: 6 %
Neutro Abs: 2.1 10*3/uL (ref 1.7–7.7)
Neutrophils Relative %: 40 %
Platelets: 183 10*3/uL (ref 150–400)
RBC: 4.75 MIL/uL (ref 3.87–5.11)
RDW: 13.3 % (ref 11.5–15.5)
WBC: 5.4 10*3/uL (ref 4.0–10.5)

## 2017-02-14 LAB — LIPASE, BLOOD: Lipase: 23 U/L (ref 11–51)

## 2017-02-14 MED ORDER — KETOROLAC TROMETHAMINE 30 MG/ML IJ SOLN
15.0000 mg | Freq: Once | INTRAMUSCULAR | Status: AC
  Administered 2017-02-14: 15 mg via INTRAVENOUS
  Filled 2017-02-14: qty 1

## 2017-02-14 MED ORDER — METHYLPREDNISOLONE SODIUM SUCC 125 MG IJ SOLR
125.0000 mg | Freq: Once | INTRAMUSCULAR | Status: AC
  Administered 2017-02-14: 125 mg via INTRAVENOUS
  Filled 2017-02-14: qty 2

## 2017-02-14 MED ORDER — ONDANSETRON HCL 4 MG/2ML IJ SOLN
4.0000 mg | Freq: Once | INTRAMUSCULAR | Status: AC
  Administered 2017-02-14: 4 mg via INTRAVENOUS
  Filled 2017-02-14: qty 2

## 2017-02-14 MED ORDER — HYDROCODONE-ACETAMINOPHEN 5-325 MG PO TABS: 1.0000 | ORAL_TABLET | ORAL | 0 refills | Status: DC | PRN

## 2017-02-14 MED ORDER — HYDROMORPHONE HCL 1 MG/ML IJ SOLN
0.7500 mg | Freq: Once | INTRAMUSCULAR | Status: AC
Start: 2017-02-14 — End: 2017-02-14
  Administered 2017-02-14: 0.75 mg via INTRAVENOUS
  Filled 2017-02-14: qty 1

## 2017-02-14 MED ORDER — SODIUM CHLORIDE 0.9 % IV BOLUS (SEPSIS)
1000.0000 mL | Freq: Once | INTRAVENOUS | Status: AC
  Administered 2017-02-14: 1000 mL via INTRAVENOUS

## 2017-02-14 NOTE — Discharge Instructions (Signed)
Take 80m of prednisone for the next 4 days. Take zofran as needed for nausea. If your prescribed tramadol is not sufficient for your pain then you may take vicodin for breakthrough.

## 2017-02-14 NOTE — ED Triage Notes (Signed)
Pt states she feels like she is having an ulcerative colits flare up. Pt tearful in triage c/o of pain from lower abd down legs and arms. N/v x 1 yesterday , diarrhea x 2 yesterday.

## 2017-02-15 ENCOUNTER — Other Ambulatory Visit (HOSPITAL_COMMUNITY): Payer: Self-pay | Admitting: *Deleted

## 2017-02-17 ENCOUNTER — Ambulatory Visit (HOSPITAL_COMMUNITY)
Admission: RE | Admit: 2017-02-17 | Discharge: 2017-02-17 | Disposition: A | Discharge status: Home / Self Care | Payer: BLUE CROSS/BLUE SHIELD | Source: Ambulatory Visit | Attending: Gastroenterology | Admitting: Gastroenterology

## 2017-02-17 DIAGNOSIS — K5190 Ulcerative colitis, unspecified, without complications: Secondary | ICD-10-CM | POA: Diagnosis present

## 2017-02-17 MED ORDER — SODIUM CHLORIDE 0.9 % IV SOLN: INTRAVENOUS | Status: DC

## 2017-02-17 MED ORDER — SODIUM CHLORIDE 0.9 % IV SOLN
5.0000 mg/kg | INTRAVENOUS | Status: DC
  Administered 2017-02-17: 400 mg via INTRAVENOUS
  Filled 2017-02-17: qty 40

## 2017-02-24 NOTE — ED Provider Notes (Signed)
[facility] DEPT Provider Note   CSN: [id number] Arrival date & time: 02/14/17  1111     History   Chief Complaint Chief Complaint  Patient presents with  . Abdominal Pain    HPI Gabrielle Hawkins is a 51 y.o. female.  HPI   39yf with abdominal pain onset yesterday. Lower abdomen. Doesn't lateralize. Hx of UC. She thinks she may be having a flare. N/v and diarrhea. No blood/mucus in stool. No fever. No urinary complaints. No unusual vag bleeding or discharge. Followed by gi.  Past Medical History:  Diagnosis Date  . Anemia 1998   . Anxiety   . Arthritis    big toe   . Asthma   . BRCA1 negative   . BRCA2 negative   . Breast cancer ([place]) 2006   chem/radiation/2years tamoxifen  . Chronic neck and back pain   . Depression   . Endometriosis   . Fibroid   . GERD (gastroesophageal reflux disease)   . History of chemotherapy 01/2005  . Hx of tamoxifen therapy   . Hx of transfusion of packed red blood cells   . IBS (irritable bowel syndrome)   . Kidney stones   . Lymphedema of arm   . Migraines    just at dx of breast CA  . Mucinous cystadenoma of ovary    left  . Neuropathy   . Paresthesias   . Peripheral neuropathy   . Radiation 04/2005  . Stroke [facility])    related to when taken compazine  . Ulcerative colitis ([place])    with chronic diarrhea  . Urinary tract infection    x 3  . Vertigo 2014  . Vitamin D deficiency     Patient Active Problem List   Diagnosis Date Noted  . Abdominal pain, chronic, right lower quadrant 01/06/2015  . Nausea and vomiting 01/05/2015  . Ulcerative colitis ([place]) 01/05/2015  . Enteritis   . Nausea with vomiting   . Intractable nausea and vomiting 04/29/2014  . Benign paroxysmal positional vertigo 03/14/2013  . Breast cancer, stage 1 ([place]) 12/30/2011  . Lymphedema of arm 12/30/2011    Past Surgical History:  Procedure Laterality Date  . ABDOMINAL HYSTERECTOMY  2007   TAH/BSO  . BREAST SURGERY  2006   LUMPECTOMY  .  CHOLECYSTECTOMY    . CHOLECYSTECTOMY N/A 04/29/2014   Procedure: LAPAROSCOPIC CHOLECYSTECTOMY WITH INTRAOPERATIVE CHOLANGIOGRAM;  Surgeon: Edward Jolly, MD;  Location[facility];  Service: General;  Laterality: N/A;  . COLONOSCOPY  June 2013   Dr. Benson Norway: sessile serrated adenoma  . COLONOSCOPY WITH PROPOFOL N/A 03/13/2015   Procedure: COLONOSCOPY WITH PROPOFOL;  Surgeon: [name], MD;  Location: [facility];  Service: Endoscopy;  Laterality: N/A;  . DILATION AND CURETTAGE OF UTERUS    . FLEXIBLE SIGMOIDOSCOPY N/A 07/18/2014   Dr. Benson Norway: mild left-sided colitis, biopsies with quiescent UC  . MYOMECTOMY    . TONSILLECTOMY      OB History    Gravida Para Term Preterm AB Living   1 0     1 0   SAB TAB Ectopic Multiple Live Births   1               Home Medications    Prior to Admission medications   Medication Sig Start Date End Date Taking? Authorizing Provider  acetaminophen (TYLENOL) 500 MG tablet Take 1,000 mg by mouth every 6 (six) hours as needed.   Yes [provider]  amitriptyline (ELAVIL) 25 MG  tablet Take 25 mg by mouth at bedtime.  05/20/15  Yes [provider]  beclomethasone (QVAR) 80 MCG/ACT inhaler Inhale 1 puff into the lungs daily as needed (Shortness of Breath).   Yes [provider]  cetirizine (ZYRTEC) 10 MG tablet Take 10 mg by mouth daily.   Yes [provider]  cyanocobalamin (,VITAMIN B-12,) 1000 MCG/ML injection Inject 1,000 mcg into the muscle every 30 (thirty) days.   Yes [provider]  dicyclomine (BENTYL) 20 MG tablet Take 20 mg by mouth at bedtime.    Yes [provider]  diphenhydrAMINE (BENADRYL) 25 mg capsule Take 25 mg by mouth daily as needed for itching.   Yes [provider]  gabapentin (NEURONTIN) 300 MG capsule Take 300 mg by mouth at bedtime.    Yes [provider]  hyoscyamine (LEVSIN, ANASPAZ) 0.125 MG tablet Take 0.125 mg by mouth 2 (two) times daily.    Yes  [provider]  losartan (COZAAR) 25 MG tablet Take 25 mg by mouth daily.   Yes [provider]  ondansetron (ZOFRAN) 4 MG tablet Take 4 mg by mouth every 6 (six) hours as needed for nausea or vomiting.   Yes [provider]  OVER THE COUNTER MEDICATION Take 1 capsule by mouth daily. IB GUARD   Yes [provider]  traMADol (ULTRAM) 50 MG tablet Take 50 mg by mouth every 6 (six) hours as needed for moderate pain.    Yes [provider]  TURMERIC PO Take 1 tablet by mouth daily.   Yes [provider]  HYDROcodone-acetaminophen (NORCO/VICODIN) 5-325 MG tablet Take 1 tablet by mouth every 4 (four) hours as needed. Patient not taking: Reported on 02/14/2017 10/29/16   Evalee Jefferson, PA-C  HYDROcodone-acetaminophen (NORCO/VICODIN) 5-325 MG tablet Take 1-2 tablets by mouth every 4 (four) hours as needed. 02/14/17   Virgel Manifold, MD    Family History Family History  Problem Relation Age of Onset  . Breast cancer Mother 16  . Heart failure Father   . Heart disease Father   . Cancer Maternal Grandmother        COLON  . Heart failure Paternal Grandmother   . Heart disease Paternal Grandmother     Social History Social History  Substance Use Topics  . Smoking status: Never Smoker  . Smokeless tobacco: Never Used  . Alcohol use No     Allergies   Ampicillin; Compazine [prochlorperazine maleate]; Cymbalta [duloxetine hcl]; Imitrex [sumatriptan base]; Lyrica [pregabalin]; Metoclopramide hcl; Percocet [oxycodone-acetaminophen]; and Effexor [venlafaxine hydrochloride]   Review of Systems Review of Systems  All systems reviewed and negative, other than as noted in HPI.  Physical Exam Updated Vital Signs BP 139/90   Pulse 70   Temp (!) 94.4 F (34.7 C) (Oral)   Resp 16   Ht 5' 8.5" (1.74 m)   Wt 85.3 kg (188 lb)   SpO2 100%   BMI 28.17 kg/m   Physical Exam  Constitutional: She appears well-developed and well-nourished. No  distress.  HENT:  Head: Normocephalic and atraumatic.  Eyes: Conjunctivae are normal. Right eye exhibits no discharge. Left eye exhibits no discharge.  Neck: Neck supple.  Cardiovascular: Normal rate, regular rhythm and normal heart sounds.  Exam reveals no gallop and no friction rub.   No murmur heard. Pulmonary/Chest: Effort normal and breath sounds normal. No respiratory distress.  Abdominal: Soft. She exhibits no distension. There is no tenderness.  Mild diffuse tenderness. Perhaps mildly worse lower abdomen but  distractable. No rebound/guarding or distension.  Musculoskeletal: She exhibits no edema or tenderness.  Neurological: She is alert.  Skin: Skin is warm and dry.  Psychiatric: She has a normal mood and affect. Her behavior is normal. Thought content normal.  Nursing note and vitals reviewed.    ED Treatments / Results  Labs (all labs ordered are listed, but only abnormal results are displayed) Labs Reviewed  COMPREHENSIVE METABOLIC PANEL - Abnormal; Notable for the following:       Result Value   Glucose, Bld 100 (*)    All other components within normal limits  LIPASE, BLOOD  URINALYSIS, ROUTINE W REFLEX MICROSCOPIC  CBC WITH DIFFERENTIAL/PLATELET    EKG  EKG Interpretation None       Radiology No results found.  Procedures Procedures (including critical care time)  Medications Ordered in ED Medications  HYDROmorphone (DILAUDID) injection 0.75 mg (0.75 mg Intravenous Given 02/14/17 1248)  methylPREDNISolone sodium succinate (SOLU-MEDROL) 125 mg/2 mL injection 125 mg (125 mg Intravenous Given 02/14/17 1246)  ketorolac (TORADOL) 30 MG/ML injection 15 mg (15 mg Intravenous Given 02/14/17 1247)  ondansetron (ZOFRAN) injection 4 mg (4 mg Intravenous Given 02/14/17 1245)  sodium chloride 0.9 % bolus 1,000 mL (0 mLs Intravenous Stopped 02/14/17 1418)  ondansetron (ZOFRAN) injection 4 mg (4 mg Intravenous Given 02/14/17 1500)     Initial Impression / Assessment  and Plan / ED Course  I have reviewed the triage vital signs and the nursing notes.  Pertinent labs & imaging results that were available during my care of the patient were reviewed by me and considered in my medical decision making (see chart for details).     Abdominal pain. No peritonitis. Now feels much better. I doubt emergent cause.   Final Clinical Impressions(s) / ED Diagnoses   Final diagnoses:  Abdominal pain, unspecified abdominal location    New Prescriptions Discharge Medication List as of 02/14/2017  3:06 PM    START taking these medications   Details  !! HYDROcodone-acetaminophen (NORCO/VICODIN) 5-325 MG tablet Take 1-2 tablets by mouth every 4 (four) hours as needed., Starting Tue 02/14/2017, Print     !! - Potential duplicate medications found. Please discuss with provider.       Virgel Manifold, MD 02/24/17 1123

## 2017-03-06 MED FILL — AMITRIPTYLINE HCL 25 MG TAB: 25 | 30 days supply | Qty: 30 | Fill #3

## 2017-03-07 MED FILL — HYDROCODON-APAP 5-325: 5-325 | 2 days supply | Qty: 12 | Fill #0

## 2017-03-17 ENCOUNTER — Encounter (HOSPITAL_COMMUNITY): Payer: BLUE CROSS/BLUE SHIELD

## 2017-05-03 DIAGNOSIS — K5190 Ulcerative colitis, unspecified, without complications: Secondary | ICD-10-CM | POA: Diagnosis not present

## 2017-05-31 DIAGNOSIS — K5090 Crohn's disease, unspecified, without complications: Secondary | ICD-10-CM | POA: Diagnosis not present

## 2017-05-31 DIAGNOSIS — I10 Essential (primary) hypertension: Secondary | ICD-10-CM | POA: Diagnosis not present

## 2017-05-31 DIAGNOSIS — K5190 Ulcerative colitis, unspecified, without complications: Secondary | ICD-10-CM | POA: Diagnosis not present

## 2017-06-19 DIAGNOSIS — K5190 Ulcerative colitis, unspecified, without complications: Secondary | ICD-10-CM | POA: Diagnosis not present

## 2017-06-19 DIAGNOSIS — Z23 Encounter for immunization: Secondary | ICD-10-CM | POA: Diagnosis not present

## 2017-07-17 DIAGNOSIS — K5190 Ulcerative colitis, unspecified, without complications: Secondary | ICD-10-CM | POA: Diagnosis not present

## 2017-08-01 DIAGNOSIS — Z6829 Body mass index (BMI) 29.0-29.9, adult: Secondary | ICD-10-CM | POA: Diagnosis not present

## 2017-08-01 DIAGNOSIS — Z79899 Other long term (current) drug therapy: Secondary | ICD-10-CM | POA: Diagnosis not present

## 2017-08-01 DIAGNOSIS — E663 Overweight: Secondary | ICD-10-CM | POA: Diagnosis not present

## 2017-08-01 DIAGNOSIS — M4690 Unspecified inflammatory spondylopathy, site unspecified: Secondary | ICD-10-CM | POA: Diagnosis not present

## 2017-08-01 DIAGNOSIS — M2550 Pain in unspecified joint: Secondary | ICD-10-CM | POA: Diagnosis not present

## 2017-08-01 DIAGNOSIS — K5100 Ulcerative (chronic) pancolitis without complications: Secondary | ICD-10-CM | POA: Diagnosis not present

## 2017-08-07 DIAGNOSIS — K5190 Ulcerative colitis, unspecified, without complications: Secondary | ICD-10-CM | POA: Diagnosis not present

## 2017-08-07 DIAGNOSIS — J329 Chronic sinusitis, unspecified: Secondary | ICD-10-CM | POA: Diagnosis not present

## 2017-08-21 ENCOUNTER — Other Ambulatory Visit: Payer: Self-pay

## 2017-08-21 ENCOUNTER — Encounter (HOSPITAL_COMMUNITY): Payer: Self-pay | Admitting: *Deleted

## 2017-08-21 ENCOUNTER — Emergency Department (HOSPITAL_COMMUNITY): Payer: Medicare HMO

## 2017-08-21 ENCOUNTER — Emergency Department (HOSPITAL_COMMUNITY)
Admission: EM | Admit: 2017-08-21 | Discharge: 2017-08-22 | Disposition: A | Discharge status: Home / Self Care | Payer: Medicare HMO | Attending: Emergency Medicine | Admitting: Emergency Medicine

## 2017-08-21 DIAGNOSIS — Z79899 Other long term (current) drug therapy: Secondary | ICD-10-CM | POA: Diagnosis not present

## 2017-08-21 DIAGNOSIS — J45909 Unspecified asthma, uncomplicated: Secondary | ICD-10-CM | POA: Diagnosis not present

## 2017-08-21 DIAGNOSIS — J209 Acute bronchitis, unspecified: Secondary | ICD-10-CM | POA: Diagnosis not present

## 2017-08-21 DIAGNOSIS — R05 Cough: Secondary | ICD-10-CM | POA: Diagnosis not present

## 2017-08-21 MED ORDER — ALBUTEROL SULFATE HFA 108 (90 BASE) MCG/ACT IN AERS
2.0000 | INHALATION_SPRAY | Freq: Once | RESPIRATORY_TRACT | Status: AC
  Administered 2017-08-22: 2 via RESPIRATORY_TRACT
  Filled 2017-08-21: qty 6.7

## 2017-08-21 MED ORDER — IPRATROPIUM-ALBUTEROL 0.5-2.5 (3) MG/3ML IN SOLN
3.0000 mL | Freq: Once | RESPIRATORY_TRACT | Status: AC
  Administered 2017-08-22: 3 mL via RESPIRATORY_TRACT
  Filled 2017-08-21: qty 3

## 2017-08-21 MED ORDER — AZITHROMYCIN 250 MG PO TABS: ORAL_TABLET | ORAL | 0 refills | Status: DC

## 2017-08-21 MED ORDER — AZITHROMYCIN 250 MG PO TABS
500.0000 mg | ORAL_TABLET | Freq: Once | ORAL | Status: AC
  Administered 2017-08-21: 500 mg via ORAL
  Filled 2017-08-21: qty 2

## 2017-08-21 NOTE — ED Provider Notes (Signed)
[facility] EMERGENCY DEPARTMENT Provider Note   CSN: [id number] Arrival date & time: 08/21/17  2251     History   Chief Complaint Chief Complaint  Patient presents with  . Cough    HPI Gabrielle Hawkins is a 51 y.o. female.  This patient is a 51 year old female with past medical history of inflammatory bowel disease receiving Remicade injections.  She presents today for evaluation of chest and nasal congestion with cough that has been ongoing for the past 3 weeks.  She has been trying over-the-counter medicines however they are not helping.  She reports some wheezing.  She denies any fevers or chills.  She denies any ill contacts.   The history is provided by the patient.  Cough  This is a new problem. Episode onset: 3 weeks ago. The problem occurs constantly. The problem has been gradually worsening. The cough is non-productive. There has been no fever. Associated symptoms include rhinorrhea, sore throat and wheezing. Pertinent negatives include no chills and no shortness of breath. She has tried decongestants for the symptoms. The treatment provided no relief. She is not a smoker.    Past Medical History:  Diagnosis Date  . Anemia 1998   . Anxiety   . Arthritis    big toe   . Asthma   . BRCA1 negative   . BRCA2 negative   . Breast cancer ([place]) 2006   chem/radiation/2years tamoxifen  . Chronic neck and back pain   . Depression   . Endometriosis   . Fibroid   . GERD (gastroesophageal reflux disease)   . History of chemotherapy 01/2005  . Hx of tamoxifen therapy   . Hx of transfusion of packed red blood cells   . IBS (irritable bowel syndrome)   . Kidney stones   . Lymphedema of arm   . Migraines    just at dx of breast CA  . Mucinous cystadenoma of ovary    left  . Neuropathy   . Paresthesias   . Peripheral neuropathy   . Radiation 04/2005  . Stroke [facility])    related to when taken compazine  . Ulcerative colitis ([place])    with chronic diarrhea  . Urinary  tract infection    x 3  . Vertigo 2014  . Vitamin D deficiency     Patient Active Problem List   Diagnosis Date Noted  . Abdominal pain, chronic, right lower quadrant 01/06/2015  . Nausea and vomiting 01/05/2015  . Ulcerative colitis ([place]) 01/05/2015  . Enteritis   . Nausea with vomiting   . Intractable nausea and vomiting 04/29/2014  . Benign paroxysmal positional vertigo 03/14/2013  . Breast cancer, stage 1 ([place]) 12/30/2011  . Lymphedema of arm 12/30/2011    Past Surgical History:  Procedure Laterality Date  . ABDOMINAL HYSTERECTOMY  2007   TAH/BSO  . BREAST SURGERY  2006   LUMPECTOMY  . CHOLECYSTECTOMY    . CHOLECYSTECTOMY N/A 04/29/2014   Procedure: LAPAROSCOPIC CHOLECYSTECTOMY WITH INTRAOPERATIVE CHOLANGIOGRAM;  Surgeon: Edward Jolly, MD;  Location: [facility] ORS;  Service: General;  Laterality: N/A;  . COLONOSCOPY  June 2013   Dr. Benson Norway: sessile serrated adenoma  . COLONOSCOPY WITH PROPOFOL N/A 03/13/2015   Procedure: COLONOSCOPY WITH PROPOFOL;  Surgeon: [name], MD;  Location: [facility] ENDOSCOPY;  Service: Endoscopy;  Laterality: N/A;  . DILATION AND CURETTAGE OF UTERUS    . FLEXIBLE SIGMOIDOSCOPY N/A 07/18/2014   Dr. Benson Norway: mild left-sided colitis, biopsies with quiescent UC  . MYOMECTOMY    .  TONSILLECTOMY      OB History    Gravida Para Term Preterm AB Living   1 0     1 0   SAB TAB Ectopic Multiple Live Births   1               Home Medications    Prior to Admission medications   Medication Sig Start Date End Date Taking? Authorizing Provider  acetaminophen (TYLENOL) 500 MG tablet Take 1,000 mg by mouth every 6 (six) hours as needed.    [provider]  amitriptyline (ELAVIL) 25 MG tablet Take 25 mg by mouth at bedtime.  05/20/15   [provider]  beclomethasone (QVAR) 80 MCG/ACT inhaler Inhale 1 puff into the lungs daily as needed (Shortness of Breath).    [provider]  cetirizine (ZYRTEC) 10 MG tablet Take 10 mg by mouth  daily.    [provider]  cyanocobalamin (,VITAMIN B-12,) 1000 MCG/ML injection Inject 1,000 mcg into the muscle every 30 (thirty) days.    [provider]  dicyclomine (BENTYL) 20 MG tablet Take 20 mg by mouth at bedtime.     [provider]  diphenhydrAMINE (BENADRYL) 25 mg capsule Take 25 mg by mouth daily as needed for itching.    [provider]  gabapentin (NEURONTIN) 300 MG capsule Take 300 mg by mouth at bedtime.     [provider]  HYDROcodone-acetaminophen (NORCO/VICODIN) 5-325 MG tablet Take 1 tablet by mouth every 4 (four) hours as needed. Patient not taking: Reported on 02/14/2017 10/29/16   Evalee Jefferson, PA-C  HYDROcodone-acetaminophen (NORCO/VICODIN) 5-325 MG tablet Take 1-2 tablets by mouth every 4 (four) hours as needed. 02/14/17   Virgel Manifold, MD  hyoscyamine (LEVSIN, ANASPAZ) 0.125 MG tablet Take 0.125 mg by mouth 2 (two) times daily.     [provider]  losartan (COZAAR) 25 MG tablet Take 25 mg by mouth daily.    [provider]  ondansetron (ZOFRAN) 4 MG tablet Take 4 mg by mouth every 6 (six) hours as needed for nausea or vomiting.    [provider]  OVER THE COUNTER MEDICATION Take 1 capsule by mouth daily. IB GUARD    [provider]  traMADol (ULTRAM) 50 MG tablet Take 50 mg by mouth every 6 (six) hours as needed for moderate pain.     [provider]  TURMERIC PO Take 1 tablet by mouth daily.    [provider]    Family History Family History  Problem Relation Age of Onset  . Breast cancer Mother 61  . Heart failure Father   . Heart disease Father   . Cancer Maternal Grandmother        COLON  . Heart failure Paternal Grandmother   . Heart disease Paternal Grandmother     Social History Social History   Tobacco Use  . Smoking status: Never Smoker  . Smokeless tobacco: Never Used  Substance Use Topics  . Alcohol use: No  . Drug use: No     Allergies     Ampicillin; Compazine [prochlorperazine maleate]; Cymbalta [duloxetine hcl]; Imitrex [sumatriptan base]; Lyrica [pregabalin]; Metoclopramide hcl; Percocet [oxycodone-acetaminophen]; and Effexor [venlafaxine hydrochloride]   Review of Systems Review of Systems  Constitutional: Negative for chills.  HENT: Positive for rhinorrhea and sore throat.   Respiratory: Positive for cough and wheezing. Negative for shortness of breath.   All other systems reviewed and are negative.    Physical Exam Updated Vital Signs BP [name])  146/94 (BP Location: Left Arm)   Pulse 93   Temp 97.8 F (36.6 C) (Oral)   Resp (!) 24   Ht 5' 8.5" (1.74 m)   Wt 88 kg (194 lb)   SpO2 100%   BMI 29.07 kg/m   Physical Exam  Constitutional: She is oriented to person, place, and time. She appears well-developed and well-nourished. No distress.  HENT:  Head: Normocephalic and atraumatic.  Mouth/Throat: Oropharynx is clear and moist.  Neck: Normal range of motion. Neck supple.  Cardiovascular: Normal rate and regular rhythm. Exam reveals no gallop and no friction rub.  No murmur heard. Pulmonary/Chest: Effort normal and breath sounds normal. No respiratory distress. She has no wheezes.  Abdominal: Soft. Bowel sounds are normal. She exhibits no distension. There is no tenderness.  Musculoskeletal: Normal range of motion.  Neurological: She is alert and oriented to person, place, and time.  Skin: Skin is warm and dry. She is not diaphoretic.  Nursing note and vitals reviewed.    ED Treatments / Results  Labs (all labs ordered are listed, but only abnormal results are displayed) Labs Reviewed - No data to display  EKG  EKG Interpretation None       Radiology No results found.  Procedures Procedures (including critical care time)  Medications Ordered in ED Medications  ipratropium-albuterol (DUONEB) 0.5-2.5 (3) MG/3ML nebulizer solution 3 mL (not administered)  albuterol (PROVENTIL HFA;VENTOLIN HFA)  108 (90 Base) MCG/ACT inhaler 2 puff (not administered)     Initial Impression / Assessment and Plan / ED Course  I have reviewed the triage vital signs and the nursing notes.  Pertinent labs & imaging results that were available during my care of the patient were reviewed by me and considered in my medical decision making (see chart for details).  Patient presents with complaints of URI-like symptoms that have been persistent for the past 3 weeks.  She is obtaining no relief with over-the-counter medications.  Her chest x-ray and lungs are clear.  Due to the prolonged nature of her symptoms, she will be prescribed an antibiotic and an inhaler.  She is to follow-up as needed if not improving.  Final Clinical Impressions(s) / ED Diagnoses   Final diagnoses:  None    ED Discharge Orders    None       Veryl Speak, MD 08/21/17 2354

## 2017-08-21 NOTE — ED Triage Notes (Signed)
Pt c/o cough x 3 weeks; pt states the cough is non productive

## 2017-08-21 NOTE — Discharge Instructions (Signed)
Zithromax as prescribed.  Albuterol inhaler: Use 2 puffs every 4 hours as needed for wheezing.  Continue using over-the-counter medications as needed for relief of symptoms.  Follow-up with your primary doctor if not improving in the next week, and return to the ER if symptoms significantly worsen or change.

## 2017-08-22 DIAGNOSIS — Z79899 Other long term (current) drug therapy: Secondary | ICD-10-CM | POA: Diagnosis not present

## 2017-08-22 DIAGNOSIS — J209 Acute bronchitis, unspecified: Secondary | ICD-10-CM | POA: Diagnosis not present

## 2017-08-22 DIAGNOSIS — J45909 Unspecified asthma, uncomplicated: Secondary | ICD-10-CM | POA: Diagnosis not present

## 2017-10-12 ENCOUNTER — Emergency Department (HOSPITAL_COMMUNITY)
Admission: EM | Admit: 2017-10-12 | Discharge: 2017-10-12 | Discharge status: Against Medical Advice | Payer: Medicare HMO | Attending: Emergency Medicine | Admitting: Emergency Medicine

## 2017-10-12 ENCOUNTER — Encounter (HOSPITAL_COMMUNITY): Payer: Self-pay

## 2017-10-12 ENCOUNTER — Other Ambulatory Visit: Payer: Self-pay

## 2017-10-12 ENCOUNTER — Emergency Department (HOSPITAL_COMMUNITY): Payer: Medicare HMO

## 2017-10-12 DIAGNOSIS — Z5321 Procedure and treatment not carried out due to patient leaving prior to being seen by health care provider: Secondary | ICD-10-CM | POA: Insufficient documentation

## 2017-10-12 DIAGNOSIS — J111 Influenza due to unidentified influenza virus with other respiratory manifestations: Secondary | ICD-10-CM | POA: Diagnosis not present

## 2017-10-12 DIAGNOSIS — R509 Fever, unspecified: Secondary | ICD-10-CM | POA: Insufficient documentation

## 2017-10-12 DIAGNOSIS — R05 Cough: Secondary | ICD-10-CM | POA: Diagnosis not present

## 2017-10-12 DIAGNOSIS — R0602 Shortness of breath: Secondary | ICD-10-CM | POA: Diagnosis not present

## 2017-10-12 DIAGNOSIS — R0789 Other chest pain: Secondary | ICD-10-CM | POA: Diagnosis not present

## 2017-10-12 MED ORDER — ONDANSETRON 4 MG PO TBDP
4.0000 mg | ORAL_TABLET | Freq: Once | ORAL | Status: AC
  Administered 2017-10-12: 4 mg via ORAL
  Filled 2017-10-12: qty 1

## 2017-10-12 MED ORDER — ACETAMINOPHEN 325 MG PO TABS
650.0000 mg | ORAL_TABLET | Freq: Once | ORAL | Status: AC | PRN
  Administered 2017-10-12: 650 mg via ORAL
  Filled 2017-10-12: qty 2

## 2017-10-12 NOTE — ED Triage Notes (Signed)
Patient reports of generalized body aches, cough, fever and nasal drainage since Monday. Reports of chest soreness when coughing.

## 2017-10-13 NOTE — ED Notes (Addendum)
10/13/2017, Follow-up call completed.  Pt. Very grateful for the follow-up call.

## 2017-10-30 DIAGNOSIS — K5190 Ulcerative colitis, unspecified, without complications: Secondary | ICD-10-CM | POA: Diagnosis not present

## 2017-11-20 ENCOUNTER — Other Ambulatory Visit: Payer: Self-pay | Admitting: Internal Medicine

## 2017-11-20 DIAGNOSIS — Z1231 Encounter for screening mammogram for malignant neoplasm of breast: Secondary | ICD-10-CM

## 2017-11-27 DIAGNOSIS — K5190 Ulcerative colitis, unspecified, without complications: Secondary | ICD-10-CM | POA: Diagnosis not present

## 2017-12-08 ENCOUNTER — Ambulatory Visit: Payer: Medicare HMO

## 2017-12-27 DIAGNOSIS — K5180 Other ulcerative colitis without complications: Secondary | ICD-10-CM | POA: Diagnosis not present

## 2017-12-27 DIAGNOSIS — M2550 Pain in unspecified joint: Secondary | ICD-10-CM | POA: Diagnosis not present

## 2017-12-27 DIAGNOSIS — Z23 Encounter for immunization: Secondary | ICD-10-CM | POA: Diagnosis not present

## 2017-12-27 DIAGNOSIS — N390 Urinary tract infection, site not specified: Secondary | ICD-10-CM | POA: Diagnosis not present

## 2017-12-27 DIAGNOSIS — K5090 Crohn's disease, unspecified, without complications: Secondary | ICD-10-CM | POA: Diagnosis not present

## 2017-12-27 DIAGNOSIS — Z1231 Encounter for screening mammogram for malignant neoplasm of breast: Secondary | ICD-10-CM | POA: Diagnosis not present

## 2017-12-28 ENCOUNTER — Ambulatory Visit
Admission: RE | Admit: 2017-12-28 | Discharge: 2017-12-28 | Disposition: A | Discharge status: Home / Self Care | Payer: Medicare HMO | Source: Ambulatory Visit | Attending: Internal Medicine | Admitting: Internal Medicine

## 2017-12-28 DIAGNOSIS — Z1231 Encounter for screening mammogram for malignant neoplasm of breast: Secondary | ICD-10-CM

## 2018-01-02 DIAGNOSIS — E663 Overweight: Secondary | ICD-10-CM | POA: Diagnosis not present

## 2018-01-02 DIAGNOSIS — Z79899 Other long term (current) drug therapy: Secondary | ICD-10-CM | POA: Diagnosis not present

## 2018-01-02 DIAGNOSIS — M459 Ankylosing spondylitis of unspecified sites in spine: Secondary | ICD-10-CM | POA: Diagnosis not present

## 2018-01-02 DIAGNOSIS — M2550 Pain in unspecified joint: Secondary | ICD-10-CM | POA: Diagnosis not present

## 2018-01-02 DIAGNOSIS — Z6829 Body mass index (BMI) 29.0-29.9, adult: Secondary | ICD-10-CM | POA: Diagnosis not present

## 2018-01-02 DIAGNOSIS — K5100 Ulcerative (chronic) pancolitis without complications: Secondary | ICD-10-CM | POA: Diagnosis not present

## 2018-01-16 DIAGNOSIS — K5100 Ulcerative (chronic) pancolitis without complications: Secondary | ICD-10-CM | POA: Diagnosis not present

## 2018-01-30 DIAGNOSIS — K5100 Ulcerative (chronic) pancolitis without complications: Secondary | ICD-10-CM | POA: Diagnosis not present

## 2018-02-11 DIAGNOSIS — R110 Nausea: Secondary | ICD-10-CM | POA: Diagnosis not present

## 2018-02-11 DIAGNOSIS — R509 Fever, unspecified: Secondary | ICD-10-CM | POA: Diagnosis not present

## 2018-02-11 DIAGNOSIS — J180 Bronchopneumonia, unspecified organism: Secondary | ICD-10-CM | POA: Diagnosis not present

## 2018-02-26 DIAGNOSIS — J188 Other pneumonia, unspecified organism: Secondary | ICD-10-CM | POA: Diagnosis not present

## 2018-02-26 DIAGNOSIS — K5190 Ulcerative colitis, unspecified, without complications: Secondary | ICD-10-CM | POA: Diagnosis not present

## 2018-03-12 DIAGNOSIS — K5190 Ulcerative colitis, unspecified, without complications: Secondary | ICD-10-CM | POA: Diagnosis not present

## 2018-03-19 DIAGNOSIS — K5190 Ulcerative colitis, unspecified, without complications: Secondary | ICD-10-CM | POA: Diagnosis not present

## 2018-04-03 DIAGNOSIS — M2550 Pain in unspecified joint: Secondary | ICD-10-CM | POA: Diagnosis not present

## 2018-04-03 DIAGNOSIS — Z79899 Other long term (current) drug therapy: Secondary | ICD-10-CM | POA: Diagnosis not present

## 2018-04-03 DIAGNOSIS — M459 Ankylosing spondylitis of unspecified sites in spine: Secondary | ICD-10-CM | POA: Diagnosis not present

## 2018-04-03 DIAGNOSIS — E663 Overweight: Secondary | ICD-10-CM | POA: Diagnosis not present

## 2018-04-03 DIAGNOSIS — K5100 Ulcerative (chronic) pancolitis without complications: Secondary | ICD-10-CM | POA: Diagnosis not present

## 2018-04-03 DIAGNOSIS — Z6829 Body mass index (BMI) 29.0-29.9, adult: Secondary | ICD-10-CM | POA: Diagnosis not present

## 2018-04-12 DIAGNOSIS — K5100 Ulcerative (chronic) pancolitis without complications: Secondary | ICD-10-CM | POA: Diagnosis not present

## 2018-05-02 DIAGNOSIS — I10 Essential (primary) hypertension: Secondary | ICD-10-CM | POA: Diagnosis not present

## 2018-05-02 DIAGNOSIS — Z1331 Encounter for screening for depression: Secondary | ICD-10-CM | POA: Diagnosis not present

## 2018-05-11 ENCOUNTER — Encounter: Payer: Self-pay | Admitting: Licensed Clinical Social Worker

## 2018-05-16 DIAGNOSIS — K5100 Ulcerative (chronic) pancolitis without complications: Secondary | ICD-10-CM | POA: Diagnosis not present

## 2018-06-04 DIAGNOSIS — K5100 Ulcerative (chronic) pancolitis without complications: Secondary | ICD-10-CM | POA: Diagnosis not present

## 2018-06-04 DIAGNOSIS — M459 Ankylosing spondylitis of unspecified sites in spine: Secondary | ICD-10-CM | POA: Diagnosis not present

## 2018-06-04 DIAGNOSIS — Z6830 Body mass index (BMI) 30.0-30.9, adult: Secondary | ICD-10-CM | POA: Diagnosis not present

## 2018-06-04 DIAGNOSIS — E669 Obesity, unspecified: Secondary | ICD-10-CM | POA: Diagnosis not present

## 2018-06-04 DIAGNOSIS — Z79899 Other long term (current) drug therapy: Secondary | ICD-10-CM | POA: Diagnosis not present

## 2018-06-04 DIAGNOSIS — M2550 Pain in unspecified joint: Secondary | ICD-10-CM | POA: Diagnosis not present

## 2018-06-06 ENCOUNTER — Other Ambulatory Visit: Payer: Self-pay | Admitting: Nurse Practitioner

## 2018-06-13 DIAGNOSIS — K5190 Ulcerative colitis, unspecified, without complications: Secondary | ICD-10-CM | POA: Diagnosis not present

## 2018-06-26 ENCOUNTER — Encounter: Payer: Self-pay | Admitting: Nurse Practitioner

## 2018-06-26 DIAGNOSIS — I10 Essential (primary) hypertension: Secondary | ICD-10-CM

## 2018-06-27 ENCOUNTER — Ambulatory Visit: Payer: 59 | Admitting: Nurse Practitioner

## 2018-07-11 DIAGNOSIS — K5190 Ulcerative colitis, unspecified, without complications: Secondary | ICD-10-CM | POA: Diagnosis not present

## 2018-07-12 ENCOUNTER — Other Ambulatory Visit: Payer: Self-pay | Admitting: Nurse Practitioner

## 2018-07-12 DIAGNOSIS — I10 Essential (primary) hypertension: Secondary | ICD-10-CM

## 2018-07-12 MED ORDER — OLMESARTAN MEDOXOMIL 20 MG PO TABS: 20.0000 mg | ORAL_TABLET | Freq: Every day | ORAL | 1 refills | Status: DC

## 2018-07-30 DIAGNOSIS — E669 Obesity, unspecified: Secondary | ICD-10-CM | POA: Diagnosis not present

## 2018-07-30 DIAGNOSIS — M459 Ankylosing spondylitis of unspecified sites in spine: Secondary | ICD-10-CM | POA: Diagnosis not present

## 2018-07-30 DIAGNOSIS — M2550 Pain in unspecified joint: Secondary | ICD-10-CM | POA: Diagnosis not present

## 2018-07-30 DIAGNOSIS — M545 Low back pain: Secondary | ICD-10-CM | POA: Diagnosis not present

## 2018-07-30 DIAGNOSIS — Z6830 Body mass index (BMI) 30.0-30.9, adult: Secondary | ICD-10-CM | POA: Diagnosis not present

## 2018-07-30 DIAGNOSIS — Z79899 Other long term (current) drug therapy: Secondary | ICD-10-CM | POA: Diagnosis not present

## 2018-07-30 DIAGNOSIS — K5100 Ulcerative (chronic) pancolitis without complications: Secondary | ICD-10-CM | POA: Diagnosis not present

## 2018-08-09 DIAGNOSIS — R69 Illness, unspecified: Secondary | ICD-10-CM | POA: Diagnosis not present

## 2018-08-13 DIAGNOSIS — K5190 Ulcerative colitis, unspecified, without complications: Secondary | ICD-10-CM | POA: Diagnosis not present

## 2018-08-20 DIAGNOSIS — K5190 Ulcerative colitis, unspecified, without complications: Secondary | ICD-10-CM | POA: Diagnosis not present

## 2018-09-10 DIAGNOSIS — K5190 Ulcerative colitis, unspecified, without complications: Secondary | ICD-10-CM | POA: Diagnosis not present

## 2018-10-06 ENCOUNTER — Other Ambulatory Visit: Payer: Self-pay

## 2018-10-06 ENCOUNTER — Encounter (HOSPITAL_COMMUNITY): Payer: Self-pay | Admitting: Emergency Medicine

## 2018-10-06 ENCOUNTER — Emergency Department (HOSPITAL_COMMUNITY)
Admission: EM | Admit: 2018-10-06 | Discharge: 2018-10-07 | Disposition: A | Discharge status: Home / Self Care | Payer: Medicare HMO | Attending: Emergency Medicine | Admitting: Emergency Medicine

## 2018-10-06 ENCOUNTER — Emergency Department (HOSPITAL_COMMUNITY): Payer: Medicare HMO

## 2018-10-06 DIAGNOSIS — R1084 Generalized abdominal pain: Secondary | ICD-10-CM

## 2018-10-06 DIAGNOSIS — Z9221 Personal history of antineoplastic chemotherapy: Secondary | ICD-10-CM | POA: Diagnosis not present

## 2018-10-06 DIAGNOSIS — R1030 Lower abdominal pain, unspecified: Secondary | ICD-10-CM | POA: Diagnosis present

## 2018-10-06 DIAGNOSIS — Z923 Personal history of irradiation: Secondary | ICD-10-CM | POA: Insufficient documentation

## 2018-10-06 DIAGNOSIS — R197 Diarrhea, unspecified: Secondary | ICD-10-CM | POA: Insufficient documentation

## 2018-10-06 DIAGNOSIS — Z8673 Personal history of transient ischemic attack (TIA), and cerebral infarction without residual deficits: Secondary | ICD-10-CM | POA: Diagnosis not present

## 2018-10-06 DIAGNOSIS — Z8719 Personal history of other diseases of the digestive system: Secondary | ICD-10-CM | POA: Diagnosis not present

## 2018-10-06 DIAGNOSIS — R109 Unspecified abdominal pain: Secondary | ICD-10-CM | POA: Diagnosis not present

## 2018-10-06 DIAGNOSIS — J45909 Unspecified asthma, uncomplicated: Secondary | ICD-10-CM | POA: Diagnosis not present

## 2018-10-06 DIAGNOSIS — Z853 Personal history of malignant neoplasm of breast: Secondary | ICD-10-CM | POA: Insufficient documentation

## 2018-10-06 DIAGNOSIS — A09 Infectious gastroenteritis and colitis, unspecified: Secondary | ICD-10-CM | POA: Diagnosis not present

## 2018-10-06 HISTORY — DX: Crohn's disease, unspecified, without complications: K50.90

## 2018-10-06 LAB — COMPREHENSIVE METABOLIC PANEL
ALK PHOS: 73 U/L (ref 38–126)
ALT: 12 U/L (ref 0–44)
AST: 14 U/L — ABNORMAL LOW (ref 15–41)
Albumin: 3.7 g/dL (ref 3.5–5.0)
Anion gap: 8 (ref 5–15)
BUN: 18 mg/dL (ref 6–20)
CALCIUM: 9 mg/dL (ref 8.9–10.3)
CO2: 24 mmol/L (ref 22–32)
Chloride: 107 mmol/L (ref 98–111)
Creatinine, Ser: 0.86 mg/dL (ref 0.44–1.00)
Glucose, Bld: 112 mg/dL — ABNORMAL HIGH (ref 70–99)
Potassium: 3.6 mmol/L (ref 3.5–5.1)
Sodium: 139 mmol/L (ref 135–145)
TOTAL PROTEIN: 6.9 g/dL (ref 6.5–8.1)
Total Bilirubin: 0.5 mg/dL (ref 0.3–1.2)

## 2018-10-06 LAB — CBC
HCT: 39.2 % (ref 36.0–46.0)
Hemoglobin: 12.2 g/dL (ref 12.0–15.0)
MCH: 29 pg (ref 26.0–34.0)
MCHC: 31.1 g/dL (ref 30.0–36.0)
MCV: 93.1 fL (ref 80.0–100.0)
NRBC: 0 % (ref 0.0–0.2)
PLATELETS: 194 10*3/uL (ref 150–400)
RBC: 4.21 MIL/uL (ref 3.87–5.11)
RDW: 13.8 % (ref 11.5–15.5)
WBC: 6.4 10*3/uL (ref 4.0–10.5)

## 2018-10-06 LAB — URINALYSIS, ROUTINE W REFLEX MICROSCOPIC
Bilirubin Urine: NEGATIVE
GLUCOSE, UA: NEGATIVE mg/dL
Hgb urine dipstick: NEGATIVE
KETONES UR: NEGATIVE mg/dL
LEUKOCYTES UA: NEGATIVE
NITRITE: NEGATIVE
PROTEIN: NEGATIVE mg/dL
Specific Gravity, Urine: 1.032 — ABNORMAL HIGH (ref 1.005–1.030)
pH: 5 (ref 5.0–8.0)

## 2018-10-06 LAB — LIPASE, BLOOD: Lipase: 30 U/L (ref 11–51)

## 2018-10-06 MED ORDER — ONDANSETRON HCL 4 MG/2ML IJ SOLN
4.0000 mg | Freq: Once | INTRAMUSCULAR | Status: AC
  Administered 2018-10-06: 4 mg via INTRAVENOUS
  Filled 2018-10-06: qty 2

## 2018-10-06 MED ORDER — IOPAMIDOL (ISOVUE-300) INJECTION 61%
100.0000 mL | Freq: Once | INTRAVENOUS | Status: AC | PRN
  Administered 2018-10-06: 100 mL via INTRAVENOUS

## 2018-10-06 MED ORDER — HYDROMORPHONE HCL 1 MG/ML IJ SOLN
0.5000 mg | Freq: Once | INTRAMUSCULAR | Status: AC
  Administered 2018-10-06: 0.5 mg via INTRAVENOUS
  Filled 2018-10-06: qty 1

## 2018-10-06 MED ORDER — SODIUM CHLORIDE 0.9 % IV BOLUS
1000.0000 mL | Freq: Once | INTRAVENOUS | Status: AC
  Administered 2018-10-06: 1000 mL via INTRAVENOUS

## 2018-10-06 MED ORDER — SODIUM CHLORIDE 0.9% FLUSH: 3.0000 mL | Freq: Once | INTRAVENOUS | Status: DC

## 2018-10-06 NOTE — ED Provider Notes (Signed)
Emergency Department Provider Note   I have reviewed the triage vital signs and the nursing notes.   HISTORY  Chief Complaint Abdominal Pain   HPI Gabrielle Hawkins is a 53 y.o. female with PMH of Crohn's disease and asthma presents to the emergency department with diffuse, lower abdominal pain with multiple episodes of nonbloody diarrhea.  Patient states that the pain feels more intense and severe when compared to prior Crohn's disease flares.  She has had multiple episodes of diarrhea and nausea without vomiting.  She is not experiencing fevers or shaking chills.  Patient follows with Dr. Benson Norway with GI and receives monthly Remicade infusions.  She denies any chest pain or shortness of breath.  No flulike symptoms.  No radiation of symptoms or other modifying factors.  The patient has had decreased oral intake today because of symptoms.    Past Medical History:  Diagnosis Date  . Anemia 1998   . Anxiety   . Arthritis    big toe   . Asthma   . BRCA1 negative   . BRCA2 negative   . Breast cancer ([place]) 2006   chem/radiation/2years tamoxifen  . Chronic neck and back pain   . Crohn's disease ([place])   . Depression   . Endometriosis   . Fibroid   . GERD (gastroesophageal reflux disease)   . History of chemotherapy 01/2005  . Hx of tamoxifen therapy   . Hx of transfusion of packed red blood cells   . IBS (irritable bowel syndrome)   . Kidney stones   . Lymphedema of arm   . Migraines    just at dx of breast CA  . Mucinous cystadenoma of ovary    left  . Neuropathy   . Paresthesias   . Peripheral neuropathy   . Radiation 04/2005  . Stroke [facility])    related to when taken compazine  . Ulcerative colitis ([place])    with chronic diarrhea  . Urinary tract infection    x 3  . Vertigo 2014  . Vitamin D deficiency     Patient Active Problem List   Diagnosis Date Noted  . Essential hypertension 06/26/2018  . Abdominal pain, chronic, right lower quadrant 01/06/2015  . Nausea  and vomiting 01/05/2015  . Ulcerative colitis ([place]) 01/05/2015  . Enteritis   . Nausea with vomiting   . Intractable nausea and vomiting 04/29/2014  . Benign paroxysmal positional vertigo 03/14/2013  . Breast cancer, stage 1 ([place]) 12/30/2011  . Lymphedema of arm 12/30/2011    Past Surgical History:  Procedure Laterality Date  . ABDOMINAL HYSTERECTOMY  2007   TAH/BSO  . BREAST SURGERY  2006   LUMPECTOMY  . CHOLECYSTECTOMY    . CHOLECYSTECTOMY N/A 04/29/2014   Procedure: LAPAROSCOPIC CHOLECYSTECTOMY WITH INTRAOPERATIVE CHOLANGIOGRAM;  Surgeon: Edward Jolly, MD;  Location: [facility];  Service: General;  Laterality: N/A;  . COLONOSCOPY  June 2013   Dr. Benson Norway: sessile serrated adenoma  . COLONOSCOPY WITH PROPOFOL N/A 03/13/2015   Procedure: COLONOSCOPY WITH PROPOFOL;  Surgeon: [name], MD;  Location[facility] END[facility];  Service: Endoscopy;  Laterality: N/A;  . DILATION AND CURETTAGE OF UTERUS    . FLEXIBLE SIGMOIDOSCOPY N/A 07/18/2014   Dr. Benson Norway: mild left-sided colitis, biopsies with quiescent UC  . MYOMECTOMY    . TONSILLECTOMY      Allergies Humira [adalimumab]; Ampicillin; Codeine; Compazine [prochlorperazine maleate]; Cymbalta [duloxetine hcl]; Imitrex [sumatriptan base]; Lyrica [pregabalin]; Metoclopramide hcl; Percocet [oxycodone-acetaminophen]; Effexor [venlafaxine hydrochloride]; and Tape  Family History  Problem Relation Age of Onset  . Breast cancer Mother 1  . Heart failure Father   . Heart disease Father   . Cancer Maternal Grandmother        COLON  . Heart failure Paternal Grandmother   . Heart disease Paternal Grandmother     Social History Social History   Tobacco Use  . Smoking status: Never Smoker  . Smokeless tobacco: Never Used  Substance Use Topics  . Alcohol use: No  . Drug use: No    Review of Systems  Constitutional: No fever/chills Eyes: No visual changes. ENT: No sore throat. Cardiovascular: Denies chest pain. Respiratory: Denies  shortness of breath. Gastrointestinal: Positive abdominal pain. Positive nausea, no vomiting. Positive diarrhea.  No constipation. Genitourinary: Negative for dysuria. Musculoskeletal: Negative for back pain. Skin: Negative for rash. Neurological: Negative for headaches, focal weakness or numbness.  10-point ROS otherwise negative.  ____________________________________________   PHYSICAL EXAM:  VITAL SIGNS: ED Triage Vitals  Enc Vitals Group     BP 10/06/18 2144 (!) 113/91     Pulse Rate 10/06/18 2144 82     Resp 10/06/18 2144 17     Temp 10/06/18 2144 98.1 F (36.7 C)     Temp Source 10/06/18 2144 Oral     SpO2 10/06/18 2144 99 %     Weight 10/06/18 2145 200 lb (90.7 kg)     Height 10/06/18 2145 5' 8.5" (1.74 m)     Pain Score 10/06/18 2145 9   Constitutional: Alert and oriented. Well appearing and in no acute distress. Eyes: Conjunctivae are normal. PERRL. EOMI. Head: Atraumatic. Nose: No congestion/rhinnorhea. Mouth/Throat: Mucous membranes are moist.  Oropharynx non-erythematous. Neck: No stridor.   Cardiovascular: Normal rate, regular rhythm. Good peripheral circulation. Grossly normal heart sounds.   Respiratory: Normal respiratory effort.  No retractions. Lungs CTAB. Gastrointestinal: Soft with diffuse moderate tenderness on exam throughout the lower abdomen. No distention.  Musculoskeletal: No lower extremity tenderness nor edema. No gross deformities of extremities. Neurologic:  Normal speech and language. No gross focal neurologic deficits are appreciated.  Skin:  Skin is warm, dry and intact. No rash noted.  ____________________________________________   LABS (all labs ordered are listed, but only abnormal results are displayed)  Labs Reviewed  COMPREHENSIVE METABOLIC PANEL - Abnormal; Notable for the following components:      Result Value   Glucose, Bld 112 (*)    AST 14 (*)    All other components within normal limits  URINALYSIS, ROUTINE W REFLEX  MICROSCOPIC - Abnormal; Notable for the following components:   Specific Gravity, Urine 1.032 (*)    All other components within normal limits  LIPASE, BLOOD  CBC   ____________________________________________  RADIOLOGY  Ct Abdomen Pelvis W Contrast  Result Date: 10/07/2018 CLINICAL DATA:  Abdominal pain and nausea. EXAM: CT ABDOMEN AND PELVIS WITH CONTRAST TECHNIQUE: Multidetector CT imaging of the abdomen and pelvis was performed using the standard protocol following bolus administration of intravenous contrast. CONTRAST:  169m ISOVUE-300 IOPAMIDOL (ISOVUE-300) INJECTION 61% COMPARISON:  08/08/2015 FINDINGS: Lower chest: Infiltration or atelectasis in the left lung base. Hepatobiliary: No focal liver abnormality is seen. Status post cholecystectomy. No biliary dilatation. Pancreas: Unremarkable. No pancreatic ductal dilatation or surrounding inflammatory changes. Spleen: Normal in size without focal abnormality. Adrenals/Urinary Tract: Adrenal glands are unremarkable. Kidneys are normal, without renal calculi, focal lesion, or hydronephrosis. Bladder is decompressed. Stomach/Bowel: Stomach, small bowel, and colon are not abnormally distended. Stool fills the colon. No  wall thickening or inflammatory changes. Appendix is normal. Vascular/Lymphatic: No significant vascular findings are present. No enlarged abdominal or pelvic lymph nodes. Reproductive: Status post hysterectomy. No adnexal masses. Other: No free air or free fluid in the abdomen. Abdominal wall musculature appears intact. Musculoskeletal: Mild thoracolumbar scoliosis convex towards the left. Degenerative changes in the spine and hips. No destructive bone lesions. IMPRESSION: No acute process demonstrated in the abdomen or pelvis. No evidence of bowel obstruction or inflammation. Electronically Signed   By: Lucienne Capers M.D.   On: 10/07/2018 00:00    ____________________________________________   PROCEDURES  Procedure(s)  performed:   Procedures  None ____________________________________________   INITIAL IMPRESSION / ASSESSMENT AND PLAN / ED COURSE  Pertinent labs & imaging results that were available during my care of the patient were reviewed by me and considered in my medical decision making (see chart for details).  Presents to the emergency department with diffuse abdominal pain and diarrhea which she suspects is a Crohn's flare.  Lab work is unremarkable.  Pain improved with IV fluids and low-dose Dilaudid along with nausea medication.  No diarrhea here.  Patient low risk for C. Difficile.  No concern clinically for ischemic colitis.  CT imaging obtained which was reviewed and showed no acute findings to explain the patient's symptoms.  I reviewed the [place] drug database.  The patient does take tramadol but is not working for her.  In the past her gastroenterologist has prescribed a short course of Vicodin which I plan to do today.  I have called this into a local pharmacy and will have the patient take Imodium as needed for diarrhea but advised that the pain medication may also cause constipation.  Patient is to call her gastroenterologist on Monday.  Discussed ED return precautions in detail.  Patient is feeling well at discharge.    ____________________________________________  FINAL CLINICAL IMPRESSION(S) / ED DIAGNOSES  Final diagnoses:  Generalized abdominal pain  Diarrhea of presumed infectious origin     MEDICATIONS GIVEN DURING THIS VISIT:  Medications  sodium chloride 0.9 % bolus 1,000 mL (1,000 mLs Intravenous New Bag/Given 10/06/18 2258)  ondansetron (ZOFRAN) injection 4 mg (4 mg Intravenous Given 10/06/18 2258)  HYDROmorphone (DILAUDID) injection 0.5 mg (0.5 mg Intravenous Given 10/06/18 2258)  iopamidol (ISOVUE-300) 61 % injection 100 mL (100 mLs Intravenous Contrast Given 10/06/18 2334)     NEW OUTPATIENT MEDICATIONS STARTED DURING THIS VISIT:  New Prescriptions    HYDROCODONE-ACETAMINOPHEN (NORCO/VICODIN) 5-325 MG TABLET    Take 2 tablets by mouth every 6 (six) hours as needed for severe pain.   LOPERAMIDE (IMODIUM) 2 MG CAPSULE    Take 1 capsule (2 mg total) by mouth 4 (four) times daily as needed for diarrhea or loose stools.    Note:  This document was prepared using Dragon voice recognition software and may include unintentional dictation errors.  Nanda Quinton, MD Emergency Medicine    Juaquin Ludington, [facility], MD 10/07/18 [phone]

## 2018-10-06 NOTE — ED Triage Notes (Signed)
Pt reports abd pain with nausea but denies vomiting since last night. Pt has history of Crohns disease.

## 2018-10-07 MED ORDER — HYDROCODONE-ACETAMINOPHEN 5-325 MG PO TABS: 2.0000 | ORAL_TABLET | Freq: Four times a day (QID) | ORAL | 0 refills | Status: DC | PRN

## 2018-10-07 MED ORDER — LOPERAMIDE HCL 2 MG PO CAPS: 2.0000 mg | ORAL_CAPSULE | Freq: Four times a day (QID) | ORAL | 0 refills | Status: DC | PRN

## 2018-10-07 NOTE — Discharge Instructions (Signed)

## 2018-10-09 DIAGNOSIS — K5190 Ulcerative colitis, unspecified, without complications: Secondary | ICD-10-CM | POA: Diagnosis not present

## 2018-10-18 ENCOUNTER — Other Ambulatory Visit: Payer: Self-pay

## 2018-10-18 ENCOUNTER — Encounter: Payer: Self-pay | Admitting: Nurse Practitioner

## 2018-10-18 MED ORDER — GABAPENTIN 300 MG PO CAPS: 300.0000 mg | ORAL_CAPSULE | Freq: Every day | ORAL | 0 refills | Status: DC

## 2018-10-31 DIAGNOSIS — E669 Obesity, unspecified: Secondary | ICD-10-CM | POA: Diagnosis not present

## 2018-10-31 DIAGNOSIS — Z79899 Other long term (current) drug therapy: Secondary | ICD-10-CM | POA: Diagnosis not present

## 2018-10-31 DIAGNOSIS — E538 Deficiency of other specified B group vitamins: Secondary | ICD-10-CM | POA: Diagnosis not present

## 2018-10-31 DIAGNOSIS — M459 Ankylosing spondylitis of unspecified sites in spine: Secondary | ICD-10-CM | POA: Diagnosis not present

## 2018-10-31 DIAGNOSIS — K5100 Ulcerative (chronic) pancolitis without complications: Secondary | ICD-10-CM | POA: Diagnosis not present

## 2018-10-31 DIAGNOSIS — M2550 Pain in unspecified joint: Secondary | ICD-10-CM | POA: Diagnosis not present

## 2018-10-31 DIAGNOSIS — Z6830 Body mass index (BMI) 30.0-30.9, adult: Secondary | ICD-10-CM | POA: Diagnosis not present

## 2018-11-08 DIAGNOSIS — Z79899 Other long term (current) drug therapy: Secondary | ICD-10-CM | POA: Diagnosis not present

## 2018-11-08 DIAGNOSIS — E538 Deficiency of other specified B group vitamins: Secondary | ICD-10-CM | POA: Diagnosis not present

## 2018-11-08 DIAGNOSIS — K5100 Ulcerative (chronic) pancolitis without complications: Secondary | ICD-10-CM | POA: Diagnosis not present

## 2018-11-14 ENCOUNTER — Other Ambulatory Visit: Payer: Self-pay | Admitting: Nurse Practitioner

## 2018-11-14 MED ORDER — GABAPENTIN 300 MG PO CAPS: 300.0000 mg | ORAL_CAPSULE | Freq: Every day | ORAL | 0 refills | Status: DC

## 2018-12-10 DIAGNOSIS — K5190 Ulcerative colitis, unspecified, without complications: Secondary | ICD-10-CM | POA: Diagnosis not present

## 2018-12-25 ENCOUNTER — Ambulatory Visit (INDEPENDENT_AMBULATORY_CARE_PROVIDER_SITE_OTHER): Payer: Medicare HMO | Admitting: Neurology

## 2018-12-25 ENCOUNTER — Encounter: Payer: Self-pay | Admitting: Neurology

## 2018-12-25 ENCOUNTER — Other Ambulatory Visit: Payer: Self-pay

## 2018-12-25 ENCOUNTER — Other Ambulatory Visit: Payer: Self-pay | Admitting: *Deleted

## 2018-12-25 DIAGNOSIS — R202 Paresthesia of skin: Secondary | ICD-10-CM | POA: Diagnosis not present

## 2018-12-25 MED ORDER — GABAPENTIN 300 MG PO CAPS: 300.0000 mg | ORAL_CAPSULE | Freq: Three times a day (TID) | ORAL | 11 refills | Status: DC

## 2018-12-25 MED ORDER — GABAPENTIN 300 MG PO CAPS: 300.0000 mg | ORAL_CAPSULE | Freq: Every day | ORAL | 0 refills | Status: DC

## 2018-12-25 MED FILL — GABAPENTIN 300 MG CAPSULE: 300 | 30 days supply | Qty: 90 | Fill #0

## 2018-12-25 NOTE — Progress Notes (Signed)
[name] DOB: 07/22/1966  Virtual Visit via Video  I connected with Gabrielle Hawkins on 12/25/18 at  by video and verified that I am speaking with the correct person using two identifiers.   I discussed the limitations, risks, security and privacy concerns of performing an evaluation and management service by video and the availability of in person appointments. I also discussed with the patient that there may be a patient responsible charge related to this service. The patient expressed understanding and agreed to proceed.  HISTORICAL  Gabrielle Hawkins is a 53 year old female, seen in request by her rheumatologist Dr. Trudie Reed, [place] Dy for evaluation of bilateral upper and lower extremity paresthesia.  I have reviewed and summarized the referring note from the referring physician.  She had a history of right breast cancer, status post right lobectomy followed by chemotherapy with adriamycin, Cytoxan, and radiation therapy in 2006.  She was also diagnosed with Crohn's disease since 2014, is currently receiving Remicade and methotrexate treatment.  I saw her previously in 2014 complains of bilateral hands pain, muscle spasm, low back pain, radiating pain to bilateral lower extremity.  EMG nerve conduction study was normal, there was no evidence of cervical radiculopathy, bilateral upper extremity neuropathy,  I have personally reviewed extensive imaging studies in 2014 MRI of the brain, was essentially normal, MRI of lumbar, cervical spine, mild degenerative changes, no significant canal or foraminal narrowing  Since February 2020, she experience worsening intermittent bilateral lower and upper extremity paresthesia, she described numbness starting from her feet, quickly spreading to below knee level, can last few minutes, sometimes all day, in a bad day, she would have deep achy pain, trouble walking, but not persistent, she continue complains of low back pain,  radiating pain to bilateral lower extremity, no bowel and bladder incontinence, she denies significant neck pain, bilateral hands paresthesia usually happen independent of her lower extremity paresthesia, sometimes she felt clumsiness of her hands, difficulty tying her shoes  Observations/Objective: I have reviewed problem lists, medications, allergies.  Ambulate without any difficulty, awake alert oriented to history taking care of conversation.  Assessment and Plan: Intermittent bilateral upper lower extremity paresthesia  History of breast cancer, chemoradiation therapy, Crohn's disease, receiving methotrexate and Remicade treatment  She reported mild improvement with gabapentin 300 mg every night, will increase the dosage to 300 mg 3 times daily,  Reported allergic reaction to Cymbalta in the past, passout with that  EMG nerve conduction study to rule out peripheral neuropathy  Follow Up Instructions:  EMG nerve conduction study    I discussed the assessment and treatment plan with the patient. The patient was provided an opportunity to ask questions and all were answered. The patient agreed with the plan and demonstrated an understanding of the instructions.   The patient was advised to call back or seek an in-person evaluation if the symptoms worsen or if the condition fails to improve as anticipated.  I provided 30 minutes of non-face-to-face time during this encounter.  REVIEW OF SYSTEMS: Full 14 system review of systems performed and notable only for as above All other review of systems were negative.  ALLERGIES: Allergies  Allergen Reactions  . Humira [Adalimumab] Anaphylaxis  . Ampicillin Hives and Itching  . Codeine   . Compazine [Prochlorperazine Maleate] Other (See Comments)    "Stroke-like symptoms" Can tolerate Phenergan.  [name] Cymbalta [Duloxetine Hcl] Other (See Comments)    Fainting  . Imitrex [Sumatriptan Base] Hives and Nausea And Vomiting  .  Lyrica  [Pregabalin] Other (See Comments)    Fainting  . Metoclopramide Hcl Hives  . Percocet [Oxycodone-Acetaminophen] Itching  . Effexor [Venlafaxine Hydrochloride] Hives and Palpitations  . Tape Itching and Rash    HOME MEDICATIONS: Current Outpatient Medications  Medication Sig Dispense Refill  . amitriptyline (ELAVIL) 25 MG tablet Take 25 mg by mouth at bedtime.   4  . beclomethasone (QVAR) 80 MCG/ACT inhaler Inhale 1 puff into the lungs daily as needed (Shortness of Breath).    . celecoxib (CELEBREX) 200 MG capsule Take 200 mg by mouth every evening.    . cetirizine (ZYRTEC) 10 MG tablet Take 10 mg by mouth daily.    . cyanocobalamin (,VITAMIN B-12,) 1000 MCG/ML injection Inject 1,000 mcg into the muscle every 30 (thirty) days.    [name] dicyclomine (BENTYL) 20 MG tablet Take 20 mg by mouth at bedtime.     . diphenhydrAMINE (BENADRYL) 25 mg capsule Take 25 mg by mouth daily as needed for itching.    . folic acid (FOLVITE) 1 MG tablet Take 2 mg by mouth daily.    [name] gabapentin (NEURONTIN) 300 MG capsule Take 1 capsule (300 mg total) by mouth at bedtime. 90 capsule 0  . HYDROcodone-acetaminophen (NORCO/VICODIN) 5-325 MG tablet Take 2 tablets by mouth every 6 (six) hours as needed for severe pain. 10 tablet 0  . inFLIXimab (REMICADE) 100 MG injection Inject 100 mg into the vein every 30 (thirty) days. Infuse by intravenous route every 4 weeks over no less than     . loperamide (IMODIUM) 2 MG capsule Take 1 capsule (2 mg total) by mouth 4 (four) times daily as needed for diarrhea or loose stools. 12 capsule 0  . meloxicam (MOBIC) 15 MG tablet Take 15 mg by mouth daily.    . methocarbamol (ROBAXIN) 500 MG tablet Take 500 mg by mouth every evening. **May take additional tablet as needed for muscle spasms    . methotrexate (50 MG/ML) 1 g injection Inject into the vein every Monday. 0.28m every Monday    . olmesartan (BENICAR) 20 MG tablet Take 1 tablet (20 mg total) by mouth daily. 90 tablet 1  .  omeprazole (PRILOSEC) 20 MG capsule Take 20 mg by mouth every evening.    . ondansetron (ZOFRAN) 4 MG tablet Take 4 mg by mouth every 6 (six) hours as needed for nausea or vomiting.    . traMADol (ULTRAM) 50 MG tablet Take 50 mg by mouth every 6 (six) hours as needed for moderate pain.      No current facility-administered medications for this visit.     PAST MEDICAL HISTORY: Past Medical History:  Diagnosis Date  . Anemia 1998   . Anxiety   . Arthritis    big toe   . Asthma   . BRCA1 negative   . BRCA2 negative   . [place] [place]   chem/radiation/2years tamoxifen  . Chronic neck and back pain   . [place]'s disease [place]   . Depression   . Endometriosis   . Fibroid   . GERD (gastroesophageal reflux disease)   . History of chemotherapy 01/2005  . Hx of tamoxifen therapy   . Hx of transfusion of packed red blood cells   . IBS (irritable bowel syndrome)   . Kidney stones   . Lymphedema of arm   . Migraines    just at dx of breast CA  . Mucinous cystadenoma of ovary    left  . Neuropathy   .  Paresthesias   . Peripheral neuropathy   . Radiation 04/2005  . Stroke [facility])    related to when taken compazine  . Ulcerative colitis ([place])    with chronic diarrhea  . Urinary tract infection    x 3  . Vertigo 2014  . Vitamin D deficiency     PAST SURGICAL HISTORY: Past Surgical History:  Procedure Laterality Date  . ABDOMINAL HYSTERECTOMY  2007   TAH/BSO  . BREAST SURGERY  2006   LUMPECTOMY  . CHOLECYSTECTOMY    . CHOLECYSTECTOMY N/A 04/29/2014   Procedure: LAPAROSCOPIC CHOLECYSTECTOMY WITH INTRAOPERATIVE CHOLANGIOGRAM;  Surgeon: Edward Jolly, MD;  Location: [facility];  Service: General;  Laterality: N/A;  . COLONOSCOPY  June 2013   Dr. Benson Norway: sessile serrated adenoma  . COLONOSCOPY WITH PROPOFOL N/A 03/13/2015   Procedure: COLONOSCOPY WITH PROPOFOL;  Surgeon: [name], MD;  Location: [facility];  Service: Endoscopy;  Laterality: N/A;  . DILATION AND  CURETTAGE OF UTERUS    . FLEXIBLE SIGMOIDOSCOPY N/A 07/18/2014   Dr. Benson Norway: mild left-sided colitis, biopsies with quiescent UC  . MYOMECTOMY    . TONSILLECTOMY      FAMILY HISTORY: Family History  Problem Relation Age of Onset  . Breast cancer Mother 79  . Heart failure Father   . Heart disease Father   . Cancer Maternal Grandmother        COLON  . Heart failure Paternal Grandmother   . Heart disease Paternal Grandmother     SOCIAL HISTORY:   Social History   Socioeconomic History  . Marital status: Single    Spouse name: Not on file  . Number of children: Not on file  . Years of education: Not on file  . 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 Financial resource strain: Not on file  . Food insecurity:    Worry: Not on file    Inability: Not on file  . Transportation needs:    Medical: Not on file    Non-medical: Not on file  Tobacco Use  . Smoking status: Never Smoker  . Smokeless tobacco: Never Used  Substance and Sexual Activity  . Alcohol use: No  . Drug use: No  . Sexual activity: Never    Birth control/protection: None, Surgical    Comment: HYST  Lifestyle  . Physical activity:    Days per week: Not on file    Minutes per session: Not on file  . Stress: Not on file  Relationships  . Social connections:    Talks on phone: Not on file    Gets together: Not on file    Attends religious service: Not on file    Active member of club or organization: Not on file    Attends meetings of clubs or organizations: Not on file    Relationship status: Not on file  . Intimate partner violence:    Fear of current or ex partner: Not on file    Emotionally abused: Not on file    Physically abused: Not on file    Forced sexual activity: Not on file  Other Topics Concern  . Not on file  Social History Narrative   Patient lives at home with her nephew.    Patient has  2 years of college education.    Patient has 0 children.    Patient has a boyfriend.  Marcial Pacas, M.D. Ph.D.  [facility] [address], [place], [place] [address] Ph: [phone] Fax: [phone]  CC: Referring Provider

## 2018-12-26 ENCOUNTER — Other Ambulatory Visit: Payer: Self-pay | Admitting: Internal Medicine

## 2018-12-26 DIAGNOSIS — Z1231 Encounter for screening mammogram for malignant neoplasm of breast: Secondary | ICD-10-CM

## 2019-01-01 ENCOUNTER — Ambulatory Visit: Payer: 59 | Admitting: Neurology

## 2019-01-02 ENCOUNTER — Ambulatory Visit: Payer: 59 | Admitting: Nurse Practitioner

## 2019-01-07 DIAGNOSIS — K5190 Ulcerative colitis, unspecified, without complications: Secondary | ICD-10-CM | POA: Diagnosis not present

## 2019-01-29 DIAGNOSIS — M459 Ankylosing spondylitis of unspecified sites in spine: Secondary | ICD-10-CM | POA: Diagnosis not present

## 2019-01-29 DIAGNOSIS — Z79899 Other long term (current) drug therapy: Secondary | ICD-10-CM | POA: Diagnosis not present

## 2019-01-29 DIAGNOSIS — R69 Illness, unspecified: Secondary | ICD-10-CM | POA: Diagnosis not present

## 2019-01-29 DIAGNOSIS — K5100 Ulcerative (chronic) pancolitis without complications: Secondary | ICD-10-CM | POA: Diagnosis not present

## 2019-01-29 DIAGNOSIS — M2550 Pain in unspecified joint: Secondary | ICD-10-CM | POA: Diagnosis not present

## 2019-01-30 DIAGNOSIS — R69 Illness, unspecified: Secondary | ICD-10-CM | POA: Diagnosis not present

## 2019-02-03 ENCOUNTER — Other Ambulatory Visit: Payer: Self-pay | Admitting: Nurse Practitioner

## 2019-02-03 DIAGNOSIS — I10 Essential (primary) hypertension: Secondary | ICD-10-CM

## 2019-02-05 DIAGNOSIS — K5190 Ulcerative colitis, unspecified, without complications: Secondary | ICD-10-CM | POA: Diagnosis not present

## 2019-02-05 DIAGNOSIS — Z79899 Other long term (current) drug therapy: Secondary | ICD-10-CM | POA: Diagnosis not present

## 2019-02-06 ENCOUNTER — Other Ambulatory Visit: Payer: Self-pay

## 2019-02-06 ENCOUNTER — Encounter: Payer: Self-pay | Admitting: Nurse Practitioner

## 2019-02-06 ENCOUNTER — Other Ambulatory Visit: Payer: Self-pay | Admitting: Nurse Practitioner

## 2019-02-06 DIAGNOSIS — I10 Essential (primary) hypertension: Secondary | ICD-10-CM

## 2019-02-06 MED ORDER — OLMESARTAN MEDOXOMIL 20 MG PO TABS: ORAL_TABLET | ORAL | 0 refills | Status: DC

## 2019-02-21 ENCOUNTER — Ambulatory Visit: Payer: 59 | Admitting: Nurse Practitioner

## 2019-02-21 ENCOUNTER — Ambulatory Visit: Payer: 59

## 2019-02-22 ENCOUNTER — Ambulatory Visit: Payer: Medicare HMO

## 2019-02-22 ENCOUNTER — Encounter: Payer: Medicare HMO | Admitting: Neurology

## 2019-03-06 ENCOUNTER — Other Ambulatory Visit: Payer: Self-pay

## 2019-03-06 ENCOUNTER — Ambulatory Visit: Payer: Medicare HMO

## 2019-03-06 ENCOUNTER — Telehealth: Payer: Self-pay

## 2019-03-06 NOTE — Telephone Encounter (Signed)
This nurse called patient in order to do virtual wellness visit. Patient stated that she was not feeling well and was not in the right mind set to have our visit. She stated that she will call back to reschedule for another time.

## 2019-03-07 DIAGNOSIS — K5190 Ulcerative colitis, unspecified, without complications: Secondary | ICD-10-CM | POA: Diagnosis not present

## 2019-03-27 ENCOUNTER — Other Ambulatory Visit: Payer: Self-pay

## 2019-03-27 ENCOUNTER — Ambulatory Visit: Payer: Medicare HMO | Admitting: Neurology

## 2019-03-27 ENCOUNTER — Telehealth: Payer: Self-pay | Admitting: Neurology

## 2019-03-27 ENCOUNTER — Ambulatory Visit (INDEPENDENT_AMBULATORY_CARE_PROVIDER_SITE_OTHER): Payer: Medicare HMO | Admitting: Neurology

## 2019-03-27 DIAGNOSIS — R202 Paresthesia of skin: Secondary | ICD-10-CM

## 2019-03-27 DIAGNOSIS — Z0289 Encounter for other administrative examinations: Secondary | ICD-10-CM

## 2019-03-27 DIAGNOSIS — R200 Anesthesia of skin: Secondary | ICD-10-CM | POA: Insufficient documentation

## 2019-03-27 NOTE — Procedures (Signed)
Full Name: Gabrielle Hawkins Gender: Female MRN #: [id number] Date of Birth: 1966/05/19    Visit Date: 03/27/2019 10:27 Age: 53 Years 21 Months Old Examining Physician: Marcial Pacas, MD  Referring Physician: Berna Bue, MD History:   53 year old female, presented with right foot numbness, leg achy pain, right upper and lower extremity weakness shortly after she started Remicade treatment in 2015, also had a history of right mastectomy for right breast cancer, followed by chemoradiation therapy  On examination: She has mild right upper extremity abduction, external rotation weakness, deep tendon reflexes were normal and symmetric.  Has decreased light touch vibratory sensation at right toe.  Questionable right Babinski sign  Summary of the tests: Nerve conduction study: Bilateral superficial peroneal sensory responses showed mildly decreased snap amplitude, with moderately prolonged peak latency.  Bilateral sural sensory responses were normal.  Bilateral peroneal to EDB, tibial motor responses were normal, with exception of mildly decreased conduction velocity, and the right tibial nerve showed decreased the C map amplitude at proximal stimulation side, mostly likely related to suboptimal stimulation.  Electromyography: Selected needle examination of right lower extremity muscles and right lumbosacral paraspinal muscles were normal.  Conclusion: This is essentially a normal study.  There is no electrodiagnostic evidence of large fiber peripheral neuropathy, or right lumbosacral radiculopathy.  The mildly decreased snap amplitude, prolonged peak latency at bilateral superficial peroneal sensory responses could be due to to technical difficulty and cold limb temperature.    ------------------------------- Marcial Pacas, M.D. PhD  [facility] [place], [place] [address] Tel: [phone] Fax: [phone]        [facility]    Nerve / Sites Muscle  Latency Ref. Amplitude Ref. Rel Amp Segments Distance Velocity Ref. Area    ms ms mV mV %  cm m/s m/s mVms  L Peroneal - EDB     Ankle EDB 6.0 ?6.5 6.8 ?2.0 100 Ankle - EDB 9   29.1     Fib head EDB 13.5  6.7  97.6 Fib head - Ankle 30 40 ?44 29.7     Pop fossa EDB 16.0  5.5  82.5 Pop fossa - Fib head 10 40 ?44 24.8         Pop fossa - Ankle      R Peroneal - EDB     Ankle EDB 5.7 ?6.5 8.3 ?2.0 100 Ankle - EDB 9   30.9     Fib head EDB 13.5  7.8  93.3 Fib head - Ankle 31 40 ?44 29.9     Pop fossa EDB 16.0  7.5  96.1 Pop fossa - Fib head 10 39 ?44 29.1         Pop fossa - Ankle      L Tibial - AH     Ankle AH 5.3 ?5.8 9.3 ?4.0 100 Ankle - AH 9   25.1     Pop fossa AH 15.4  7.9  84.9 Pop fossa - Ankle 38 38 ?41 24.4  R Tibial - AH     Ankle AH 5.7 ?5.8 8.2 ?4.0 100 Ankle - AH 9   27.6     Pop fossa AH 15.8  1.6  19.2 Pop fossa - Ankle 38 38 ?41 6.5             SNC    Nerve / Sites Rec. Site Peak Lat Ref.  Amp Ref. Segments Distance    ms ms V V  cm  L Sural - Ankle (Calf)     Calf Ankle 4.2 ?4.4 10 ?6 Calf - Ankle 14  R Sural - Ankle (Calf)     Calf Ankle 4.2 ?4.4 7 ?6 Calf - Ankle 14  L Superficial peroneal - Ankle     Lat leg Ankle 5.5 ?4.4 4 ?6 Lat leg - Ankle 14  R Superficial peroneal - Ankle     Lat leg Ankle 5.2 ?4.4 4 ?6 Lat leg - Ankle 14              F  Wave    Nerve F Lat Ref.   ms ms  L Tibial - AH 57.5 ?56.0  R Tibial - AH 58.5 ?56.0         EMG    Muscle       GENERIC MUSCLE 2.1     EMG Summary Table    Spontaneous MUAP Recruitment  Muscle IA Fib PSW Fasc Other Amp Dur. Poly Pattern  R. Tibialis anterior Normal None None None _______ Normal Normal Normal Normal  R. Tibialis posterior Normal None None None _______ Normal Normal Normal Normal  R. Gastrocnemius (Medial head) Normal None None None _______ Normal Normal Normal Normal  R. Vastus lateralis Normal None None None _______ Normal Normal Normal Normal  R. Gluteus medius Normal None None None  _______ Normal Normal Normal Normal  R. Lumbar paraspinals (mid) Normal None None None _______ Normal Normal Normal Normal  R. Lumbar paraspinals (low) Normal None None None _______ Normal Normal Normal Normal

## 2019-03-27 NOTE — Telephone Encounter (Signed)
[employer] medicare order sent to [facility]. They will obtain the auth and reach out to the patient to schedule.  °

## 2019-03-28 ENCOUNTER — Telehealth: Payer: Self-pay | Admitting: Nurse Practitioner

## 2019-03-28 NOTE — Telephone Encounter (Signed)
I left a message asking the patient to call me at [phone] to reschedule AWV. VDM (DD)

## 2019-04-03 DIAGNOSIS — K5190 Ulcerative colitis, unspecified, without complications: Secondary | ICD-10-CM | POA: Diagnosis not present

## 2019-04-03 DIAGNOSIS — G589 Mononeuropathy, unspecified: Secondary | ICD-10-CM | POA: Diagnosis not present

## 2019-04-04 DIAGNOSIS — K5100 Ulcerative (chronic) pancolitis without complications: Secondary | ICD-10-CM | POA: Diagnosis not present

## 2019-04-06 DIAGNOSIS — R69 Illness, unspecified: Secondary | ICD-10-CM | POA: Diagnosis not present

## 2019-04-10 ENCOUNTER — Ambulatory Visit
Admission: RE | Admit: 2019-04-10 | Discharge: 2019-04-10 | Disposition: A | Discharge status: Home / Self Care | Payer: Medicare HMO | Source: Ambulatory Visit | Attending: Internal Medicine | Admitting: Internal Medicine

## 2019-04-10 ENCOUNTER — Other Ambulatory Visit: Payer: Self-pay

## 2019-04-10 DIAGNOSIS — Z1231 Encounter for screening mammogram for malignant neoplasm of breast: Secondary | ICD-10-CM | POA: Diagnosis not present

## 2019-04-15 ENCOUNTER — Telehealth: Payer: Self-pay | Admitting: Nurse Practitioner

## 2019-04-15 NOTE — Telephone Encounter (Signed)
I called the patient to schedule an appointment.  She asked if she can call back because she just lost her grandmother. VDM (DD)

## 2019-04-17 DIAGNOSIS — R69 Illness, unspecified: Secondary | ICD-10-CM | POA: Diagnosis not present

## 2019-04-29 ENCOUNTER — Other Ambulatory Visit: Payer: Self-pay

## 2019-04-29 ENCOUNTER — Ambulatory Visit
Admission: RE | Admit: 2019-04-29 | Discharge: 2019-04-29 | Disposition: A | Discharge status: Home / Self Care | Payer: Medicare HMO | Source: Ambulatory Visit | Attending: Neurology | Admitting: Neurology

## 2019-04-29 DIAGNOSIS — R200 Anesthesia of skin: Secondary | ICD-10-CM

## 2019-04-29 NOTE — Telephone Encounter (Signed)
[employer] medicare Gabrielle Hawkins: [id number] (exp. 04/25/19 to 10/22/19) patient had MRI at [facility] on 04/29/19.

## 2019-05-02 ENCOUNTER — Ambulatory Visit: Payer: Medicare HMO | Admitting: Neurology

## 2019-05-05 MED FILL — GABAPENTIN 300 MG CAPSULE: 300 | 30 days supply | Qty: 90 | Fill #1

## 2019-05-06 ENCOUNTER — Other Ambulatory Visit: Payer: Self-pay

## 2019-05-06 ENCOUNTER — Encounter: Payer: Self-pay | Admitting: Neurology

## 2019-05-06 ENCOUNTER — Ambulatory Visit: Payer: Medicare HMO | Admitting: Neurology

## 2019-05-06 VITALS — BP 134/92 | HR 78 | Temp 97.7°F | Ht 68.5 in | Wt 193.5 lb

## 2019-05-06 DIAGNOSIS — R202 Paresthesia of skin: Secondary | ICD-10-CM | POA: Diagnosis not present

## 2019-05-06 DIAGNOSIS — M5441 Lumbago with sciatica, right side: Secondary | ICD-10-CM

## 2019-05-06 DIAGNOSIS — R200 Anesthesia of skin: Secondary | ICD-10-CM

## 2019-05-06 NOTE — Progress Notes (Signed)
[name] DOB: [birth year] HISTORICAL  Gabrielle Hawkins is a 53 year old female, seen in request by her rheumatologist Dr. Trudie Reed, [place] Dy for evaluation of bilateral upper and lower extremity paresthesia.  I have reviewed and summarized the referring note from the referring physician.  She had a history of right breast cancer, status post right lobectomy followed by chemotherapy with adriamycin, Cytoxan, and radiation therapy in 2006.  She was also diagnosed with Crohn's disease since 2014, is currently receiving Remicade and methotrexate treatment.  I saw her previously in 2014 complains of bilateral hands pain, muscle spasm, low back pain, radiating pain to bilateral lower extremity.  EMG nerve conduction study was normal, there was no evidence of cervical radiculopathy, bilateral upper extremity neuropathy,  I have personally reviewed extensive imaging studies in 2014 MRI of the brain, was essentially normal, MRI of lumbar, cervical spine, mild degenerative changes, no significant canal or foraminal narrowing  Since February 2020, she experience worsening intermittent bilateral lower and upper extremity paresthesia, she described numbness starting from her feet, quickly spreading to below knee level, can last few minutes, sometimes all day, in a bad day, she would have deep achy pain, trouble walking, but not persistent, she continue complains of low back pain, radiating pain to bilateral lower extremity, no bowel and bladder incontinence, she denies significant neck pain, bilateral hands paresthesia usually happen independent of her lower extremity paresthesia, sometimes she felt clumsiness of her hands, difficulty tying her shoes  UPDATE May 06 2019: She continues to complains of right foot and leg paresthesia, numbness, also complains of right-sided low back pain, radiating pain to right lower extremity, she denies significant gait abnormality, sometimes her legs  give out underneath her make her fall, gabapentin 300 mg 3 times daily was helpful, but she complains of dizziness during the day  Personally reviewed MRI of the brain without contrast April 29, 2019 that was normal  REVIEW OF SYSTEMS: Full 14 system review of systems performed and notable only for as above All other review of systems were negative.  ALLERGIES: Allergies  Allergen Reactions  . Humira [Adalimumab] Anaphylaxis  . Ampicillin Hives and Itching  . Codeine   . Compazine [Prochlorperazine Maleate] Other (See Comments)    "Stroke-like symptoms" Can tolerate Phenergan.  [name] Cymbalta [Duloxetine Hcl] Other (See Comments)    Fainting  . Imitrex [Sumatriptan Base] Hives and Nausea And Vomiting  . Lyrica [Pregabalin] Other (See Comments)    Fainting  . Metoclopramide Hcl Hives  . Percocet [Oxycodone-Acetaminophen] Itching  . Effexor [Venlafaxine Hydrochloride] Hives and Palpitations  . Tape Itching and Rash    HOME MEDICATIONS: Current Outpatient Medications  Medication Sig Dispense Refill  . amitriptyline (ELAVIL) 25 MG tablet Take 25 mg by mouth at bedtime.   4  . beclomethasone (QVAR) 80 MCG/ACT inhaler Inhale 1 puff into the lungs daily as needed (Shortness of Breath).    . celecoxib (CELEBREX) 200 MG capsule Take 200 mg by mouth every evening.    . cetirizine (ZYRTEC) 10 MG tablet Take 10 mg by mouth daily.    . cyanocobalamin (,VITAMIN B-12,) 1000 MCG/ML injection Inject 1,000 mcg into the muscle every 30 (thirty) days.    [name] dicyclomine (BENTYL) 20 MG tablet Take 20 mg by mouth at bedtime.     . diphenhydrAMINE (BENADRYL) 25 mg capsule Take 25 mg by mouth daily as needed for itching.    . folic acid (FOLVITE) 1 MG tablet Take 2 mg by mouth daily.    [name]  gabapentin (NEURONTIN) 300 MG capsule Take 1 capsule (300 mg total) by mouth 3 (three) times daily. 90 capsule 11  . inFLIXimab (REMICADE) 100 MG injection Inject 100 mg into the vein every 30 (thirty) days. Infuse by  intravenous route every 4 weeks over no less than     . loperamide (IMODIUM) 2 MG capsule Take 1 capsule (2 mg total) by mouth 4 (four) times daily as needed for diarrhea or loose stools. 12 capsule 0  . meloxicam (MOBIC) 15 MG tablet Take 15 mg by mouth daily.    . methotrexate (50 MG/ML) 1 g injection Inject into the vein every Monday. 0.44m every Monday    . olmesartan (BENICAR) 20 MG tablet TAKE 1 TABLET (20MG TOTAL) BY MOUTH ONCE DAILY 90 tablet 0  . ondansetron (ZOFRAN) 4 MG tablet Take 4 mg by mouth every 6 (six) hours as needed for nausea or vomiting.    . traMADol (ULTRAM) 50 MG tablet Take 50 mg by mouth every 6 (six) hours as needed for moderate pain.      No current facility-administered medications for this visit.     PAST MEDICAL HISTORY: Past Medical History:  Diagnosis Date  . Anemia 1998   . Anxiety   . Arthritis    big toe   . Asthma   . BRCA1 negative   . BRCA2 negative   . Breast cancer ([facility] 2006   chem/radiation/2years tamoxifen  . Chronic neck and back pain   . [place]'s disease [place]   . Depression   . Endometriosis   . Fibroid   . GERD (gastroesophageal reflux disease)   . History of chemotherapy 01/2005  . Hx of tamoxifen therapy   . Hx of transfusion of packed red blood cells   . IBS (irritable bowel syndrome)   . Kidney stones   . Lymphedema of arm   . Migraines    just at dx of breast CA  . Mucinous cystadenoma of ovary    left  . Neuropathy   . Paresthesias   . Peripheral neuropathy   . Radiation 04/2005  . Stroke ([facility]    related to when taken compazine  . Ulcerative colitis ([facility]    with chronic diarrhea  . Urinary tract infection    x 3  . Vertigo 2014  . Vitamin D deficiency     PAST SURGICAL HISTORY: Past Surgical History:  Procedure Laterality Date  . ABDOMINAL HYSTERECTOMY  2007   TAH/BSO  . BREAST SURGERY  2006   LUMPECTOMY  . CHOLECYSTECTOMY    . CHOLECYSTECTOMY N/A 04/29/2014   Procedure: LAPAROSCOPIC CHOLECYSTECTOMY  WITH INTRAOPERATIVE CHOLANGIOGRAM;  Surgeon: BEdward Jolly MD;  Location: [facility];  Service: General;  Laterality: N/A;  . COLONOSCOPY  June 2013   Dr. HBenson Norway sessile serrated adenoma  . COLONOSCOPY WITH PROPOFOL N/A 03/13/2015   Procedure: COLONOSCOPY WITH PROPOFOL;  Surgeon: PCarol Ada MD;  Location: [facility] END[facility];  Service: Endoscopy;  Laterality: N/A;  . DILATION AND CURETTAGE OF UTERUS    . FLEXIBLE SIGMOIDOSCOPY N/A 07/18/2014   Dr. HBenson Norway mild left-sided colitis, biopsies with quiescent UC  . MYOMECTOMY    . TONSILLECTOMY      FAMILY HISTORY: Family History  Problem Relation Age of Onset  . Breast cancer Mother 54 . Heart failure Father   . Heart disease Father   . Cancer Maternal Grandmother        COLON  . Heart failure Paternal Grandmother   . Heart  disease Paternal Grandmother     SOCIAL HISTORY:   Social History   Socioeconomic History  . Marital status: Single    Spouse name: Not on file  . Number of children: Not on file  . Years of education: Not on file  . 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 Financial resource strain: Not on file  . Food insecurity    Worry: Not on file    Inability: Not on file  . Transportation needs    Medical: Not on file    Non-medical: Not on file  Tobacco Use  . Smoking status: Never Smoker  . Smokeless tobacco: Never Used  Substance and Sexual Activity  . Alcohol use: No  . Drug use: No  . Sexual activity: Never    Birth control/protection: None, Surgical    Comment: HYST  Lifestyle  . Physical activity    Days per week: Not on file    Minutes per session: Not on file  . Stress: Not on file  Relationships  . Social Herbalist on phone: Not on file    Gets together: Not on file    Attends religious service: Not on file    Active member of club or organization: Not on file    Attends meetings of clubs  or organizations: Not on file    Relationship status: Not on file  . Intimate partner violence    Fear of current or ex partner: Not on file    Emotionally abused: Not on file    Physically abused: Not on file    Forced sexual activity: Not on file  Other Topics Concern  . Not on file  Social History Narrative   Patient lives at home with her nephew.    Patient has 2 years of college education.    Patient has 0 children.    Patient has a boyfriend.          PHYSICAL EXAMNIATION:  Gen: NAD, conversant, well nourised, obese, well groomed                     Cardiovascular: Regular rate rhythm, no peripheral edema, warm, nontender. Eyes: Conjunctivae clear without exudates or hemorrhage Neck: Supple, no carotid bruits. Pulmonary: Clear to auscultation bilaterally   NEUROLOGICAL EXAM:  MENTAL STATUS: Speech:    Speech is normal; fluent and spontaneous with normal comprehension.  Cognition:     Orientation to time, place and person     Normal recent and remote memory     Normal Attention span and concentration     Normal Language, naming, repeating,spontaneous speech     Fund of knowledge   CRANIAL NERVES: CN II: Visual fields are full to confrontation.  Pupils are round equal and briskly reactive to light. CN III, IV, VI: extraocular movement are normal. No ptosis. CN V: Facial sensation is intact to pinprick in all 3 divisions bilaterally. Corneal responses are intact.  CN VII: Face is symmetric with normal eye closure and smile. CN VIII: Hearing is normal to rubbing fingers CN IX, X: Palate elevates symmetrically. Phonation is normal. CN XI: Head turning and shoulder shrug are intact CN XII: Tongue is midline with normal movements and no atrophy.  MOTOR: There is no pronator drift of out-stretched arms. Muscle bulk and tone are normal. Muscle strength is normal.  REFLEXES: Reflexes are  2+ and symmetric at the biceps, triceps, knees, and ankles. Plantar responses are  flexor.  SENSORY: Intact to light touch, pinprick, positional and vibratory sensation are intact in fingers and toes.  COORDINATION: Rapid alternating movements and fine finger movements are intact. There is no dysmetria on finger-to-nose and heel-knee-shin.    GAIT/STANCE: Posture is normal. Gait is steady with normal steps, base, arm swing, and turning. Heel and toe walking are normal. Tandem gait is normal.  Romberg is absent.  Assessment plan:  Right-sided low back pain radiating pain to right lowe   Need to rule out right lumbar radiculopathy  Proceed with MRI of lumbar  Continue gabapentin 300 mg 3 times daily   Marcial Pacas, M.D. Ph.D.  [facility] [address], [place], [place] [address] Ph: [phone] Fax: [phone]  CC: Referring Provider

## 2019-05-10 DIAGNOSIS — K5190 Ulcerative colitis, unspecified, without complications: Secondary | ICD-10-CM | POA: Diagnosis not present

## 2019-06-04 ENCOUNTER — Encounter: Payer: Self-pay | Admitting: Nurse Practitioner

## 2019-06-04 ENCOUNTER — Ambulatory Visit (INDEPENDENT_AMBULATORY_CARE_PROVIDER_SITE_OTHER): Payer: Medicare HMO | Admitting: Nurse Practitioner

## 2019-06-04 ENCOUNTER — Other Ambulatory Visit: Payer: Self-pay

## 2019-06-04 ENCOUNTER — Ambulatory Visit (INDEPENDENT_AMBULATORY_CARE_PROVIDER_SITE_OTHER): Payer: Medicare HMO

## 2019-06-04 VITALS — BP 120/60 | HR 88 | Temp 97.7°F | Ht 67.4 in | Wt 192.4 lb

## 2019-06-04 VITALS — BP 120/60 | HR 88 | Temp 97.7°F | Ht 67.4 in | Wt 192.6 lb

## 2019-06-04 DIAGNOSIS — M545 Low back pain, unspecified: Secondary | ICD-10-CM

## 2019-06-04 DIAGNOSIS — I10 Essential (primary) hypertension: Secondary | ICD-10-CM

## 2019-06-04 DIAGNOSIS — M5410 Radiculopathy, site unspecified: Secondary | ICD-10-CM | POA: Diagnosis not present

## 2019-06-04 DIAGNOSIS — N390 Urinary tract infection, site not specified: Secondary | ICD-10-CM

## 2019-06-04 DIAGNOSIS — Z23 Encounter for immunization: Secondary | ICD-10-CM

## 2019-06-04 DIAGNOSIS — Z0000 Encounter for general adult medical examination without abnormal findings: Secondary | ICD-10-CM

## 2019-06-04 DIAGNOSIS — K5100 Ulcerative (chronic) pancolitis without complications: Secondary | ICD-10-CM | POA: Diagnosis not present

## 2019-06-04 DIAGNOSIS — Z79899 Other long term (current) drug therapy: Secondary | ICD-10-CM | POA: Diagnosis not present

## 2019-06-04 DIAGNOSIS — K5180 Other ulcerative colitis without complications: Secondary | ICD-10-CM | POA: Diagnosis not present

## 2019-06-04 DIAGNOSIS — G4700 Insomnia, unspecified: Secondary | ICD-10-CM

## 2019-06-04 DIAGNOSIS — M2550 Pain in unspecified joint: Secondary | ICD-10-CM | POA: Diagnosis not present

## 2019-06-04 DIAGNOSIS — M459 Ankylosing spondylitis of unspecified sites in spine: Secondary | ICD-10-CM | POA: Diagnosis not present

## 2019-06-04 LAB — POCT URINALYSIS DIPSTICK
Bilirubin, UA: NEGATIVE
Blood, UA: NEGATIVE
Glucose, UA: NEGATIVE
Ketones, UA: NEGATIVE
Leukocytes, UA: NEGATIVE
Nitrite, UA: POSITIVE
Protein, UA: NEGATIVE
Spec Grav, UA: >=1.03 — AB (ref 1.010–1.025)
Urobilinogen, UA: 0.2 E.U./dL
pH, UA: 5.5 (ref 5.0–8.0)

## 2019-06-04 LAB — POCT UA - MICROALBUMIN
Albumin/Creatinine Ratio, Urine, POC: <30
Creatinine, POC: 300 mg/dL
Microalbumin Ur, POC: 10 mg/L

## 2019-06-04 MED ORDER — TEMAZEPAM 7.5 MG PO CAPS: 7.5000 mg | ORAL_CAPSULE | Freq: Every evening | ORAL | 0 refills | Status: DC | PRN

## 2019-06-04 MED ORDER — CIPROFLOXACIN HCL 500 MG PO TABS: 500.0000 mg | ORAL_TABLET | Freq: Two times a day (BID) | ORAL | 0 refills | Status: AC

## 2019-06-04 MED ORDER — TETANUS-DIPHTHERIA TOXOIDS TD 2-2 LF/0.5ML IM SUSP: 0.5000 mL | Freq: Once | INTRAMUSCULAR | 0 refills | Status: AC

## 2019-06-04 NOTE — Patient Instructions (Signed)
Gabrielle Hawkins , Thank you for taking time to come for your Medicare Wellness Visit. I appreciate your ongoing commitment to your health goals. Please review the following plan we discussed and let me know if I can assist you in the future.   Screening recommendations/referrals: Colonoscopy: 03/2015 Mammogram: 04/2019 Bone Density: 04/2009 Recommended yearly ophthalmology/optometry visit for glaucoma screening and checkup Recommended yearly dental visit for hygiene and checkup  Vaccinations: Influenza vaccine: today Pneumococcal vaccine: 12/2017 Tdap vaccine: sent to pharmacy Shingles vaccine: discussed    Advanced directives: Advance directive discussed with you today. Even though you declined this today please call our office should you change your mind and we can give you the proper paperwork for you to fill out.   Conditions/risks identified: overweight  Next appointment: 07/31/2019 at 8:45  Preventive Care 40-64 Years, Female Preventive care refers to lifestyle choices and visits with your health care provider that can promote health and wellness. What does preventive care include?  A yearly physical exam. This is also called an annual well check.  Dental exams once or twice a year.  Routine eye exams. Ask your health care provider how often you should have your eyes checked.  Personal lifestyle choices, including:  Daily care of your teeth and gums.  Regular physical activity.  Eating a healthy diet.  Avoiding tobacco and drug use.  Limiting alcohol use.  Practicing safe sex.  Taking low-dose aspirin daily starting at age 75.  Taking vitamin and mineral supplements as recommended by your health care provider. What happens during an annual well check? The services and screenings done by your health care provider during your annual well check will depend on your age, overall health, lifestyle risk factors, and family history of disease. Counseling  Your health care  provider may ask you questions about your:  Alcohol use.  Tobacco use.  Drug use.  Emotional well-being.  Home and relationship well-being.  Sexual activity.  Eating habits.  Work and work Statistician.  Method of birth control.  Menstrual cycle.  Pregnancy history. Screening  You may have the following tests or measurements:  Height, weight, and BMI.  Blood pressure.  Lipid and cholesterol levels. These may be checked every 5 years, or more frequently if you are over 20 years old.  Skin check.  Lung cancer screening. You may have this screening every year starting at age 75 if you have a 30-pack-year history of smoking and currently smoke or have quit within the past 15 years.  Fecal occult blood test (FOBT) of the stool. You may have this test every year starting at age 24.  Flexible sigmoidoscopy or colonoscopy. You may have a sigmoidoscopy every 5 years or a colonoscopy every 10 years starting at age 70.  Hepatitis C blood test.  Hepatitis B blood test.  Sexually transmitted disease (STD) testing.  Diabetes screening. This is done by checking your blood sugar (glucose) after you have not eaten for a while (fasting). You may have this done every 1-3 years.  Mammogram. This may be done every 1-2 years. Talk to your health care provider about when you should start having regular mammograms. This may depend on whether you have a family history of breast cancer.  BRCA-related cancer screening. This may be done if you have a family history of breast, ovarian, tubal, or peritoneal cancers.  Pelvic exam and Pap test. This may be done every 3 years starting at age 51. Starting at age 19, this may be done every  5 years if you have a Pap test in combination with an HPV test.  Bone density scan. This is done to screen for osteoporosis. You may have this scan if you are at high risk for osteoporosis. Discuss your test results, treatment options, and if necessary, the need  for more tests with your health care provider. Vaccines  Your health care provider may recommend certain vaccines, such as:  Influenza vaccine. This is recommended every year.  Tetanus, diphtheria, and acellular pertussis (Tdap, Td) vaccine. You may need a Td booster every 10 years.  Zoster vaccine. You may need this after age 47.  Pneumococcal 13-valent conjugate (PCV13) vaccine. You may need this if you have certain conditions and were not previously vaccinated.  Pneumococcal polysaccharide (PPSV23) vaccine. You may need one or two doses if you smoke cigarettes or if you have certain conditions. Talk to your health care provider about which screenings and vaccines you need and how often you need them. This information is not intended to replace advice given to you by your health care provider. Make sure you discuss any questions you have with your health care provider. Document Released: 09/18/2015 Document Revised: 05/11/2016 Document Reviewed: 06/23/2015 Elsevier Interactive Patient Education  2017 [place] Prevention in the Home Falls can cause injuries. They can happen to people of all ages. There are many things you can do to make your home safe and to help prevent falls. What can I do on the outside of my home?  Regularly fix the edges of walkways and driveways and fix any cracks.  Remove anything that might make you trip as you walk through a door, such as a raised step or threshold.  Trim any bushes or trees on the path to your home.  Use bright outdoor lighting.  Clear any walking paths of anything that might make someone trip, such as rocks or tools.  Regularly check to see if handrails are loose or broken. Make sure that both sides of any steps have handrails.  Any raised decks and porches should have guardrails on the edges.  Have any leaves, snow, or ice cleared regularly.  Use sand or salt on walking paths during winter.  Clean up any spills in  your garage right away. This includes oil or grease spills. What can I do in the bathroom?  Use night lights.  Install grab bars by the toilet and in the tub and shower. Do not use towel bars as grab bars.  Use non-skid mats or decals in the tub or shower.  If you need to sit down in the shower, use a plastic, non-slip stool.  Keep the floor dry. Clean up any water that spills on the floor as soon as it happens.  Remove soap buildup in the tub or shower regularly.  Attach bath mats securely with double-sided non-slip rug tape.  Do not have throw rugs and other things on the floor that can make you trip. What can I do in the bedroom?  Use night lights.  Make sure that you have a light by your bed that is easy to reach.  Do not use any sheets or blankets that are too big for your bed. They should not hang down onto the floor.  Have a firm chair that has side arms. You can use this for support while you get dressed.  Do not have throw rugs and other things on the floor that can make you trip. What can I  do in the kitchen?  Clean up any spills right away.  Avoid walking on wet floors.  Keep items that you use a lot in easy-to-reach places.  If you need to reach something above you, use a strong step stool that has a grab bar.  Keep electrical cords out of the way.  Do not use floor polish or wax that makes floors slippery. If you must use wax, use non-skid floor wax.  Do not have throw rugs and other things on the floor that can make you trip. What can I do with my stairs?  Do not leave any items on the stairs.  Make sure that there are handrails on both sides of the stairs and use them. Fix handrails that are broken or loose. Make sure that handrails are as long as the stairways.  Check any carpeting to make sure that it is firmly attached to the stairs. Fix any carpet that is loose or worn.  Avoid having throw rugs at the top or bottom of the stairs. If you do have  throw rugs, attach them to the floor with carpet tape.  Make sure that you have a light switch at the top of the stairs and the bottom of the stairs. If you do not have them, ask someone to add them for you. What else can I do to help prevent falls?  Wear shoes that:  Do not have high heels.  Have rubber bottoms.  Are comfortable and fit you well.  Are closed at the toe. Do not wear sandals.  If you use a stepladder:  Make sure that it is fully opened. Do not climb a closed stepladder.  Make sure that both sides of the stepladder are locked into place.  Ask someone to hold it for you, if possible.  Clearly mark and make sure that you can see:  Any grab bars or handrails.  First and last steps.  Where the edge of each step is.  Use tools that help you move around (mobility aids) if they are needed. These include:  Canes.  Walkers.  Scooters.  Crutches.  Turn on the lights when you go into a dark area. Replace any light bulbs as soon as they burn out.  Set up your furniture so you have a clear path. Avoid moving your furniture around.  If any of your floors are uneven, fix them.  If there are any pets around you, be aware of where they are.  Review your medicines with your doctor. Some medicines can make you feel dizzy. This can increase your chance of falling. Ask your doctor what other things that you can do to help prevent falls. This information is not intended to replace advice given to you by your health care provider. Make sure you discuss any questions you have with your health care provider. Document Released: 06/18/2009 Document Revised: 01/28/2016 Document Reviewed: 09/26/2014 Elsevier Interactive Patient Education  2017 [employer].

## 2019-06-04 NOTE — Addendum Note (Signed)
Addended by: Glenna Durand E on: 06/04/2019 12:05 PM   Modules accepted: Orders

## 2019-06-04 NOTE — Progress Notes (Signed)
Subjective:   KARALINE BURESH is a 53 y.o. female who presents for Medicare Annual (Subsequent) preventive examination.  Review of Systems:  n/a Cardiac Risk Factors include: hypertension     Objective:     Vitals: BP 120/60   Pulse 88   Temp 97.7 F (36.5 C) (Oral)   Ht 5' 7.4" (1.712 m)   Wt 192 lb 9.6 oz (87.4 kg)   BMI 29.81 kg/m   Body mass index is 29.81 kg/m.  Advanced Directives 06/04/2019 10/06/2018 10/12/2017 08/21/2017 02/14/2017 10/29/2016 08/08/2015  Does Patient Have a Medical Advance Directive? No No No No No No No  Would patient like information on creating a medical advance directive? - No - Patient declined - - - No - Patient declined No - patient declined information  Some encounter information is confidential and restricted. Go to Review Flowsheets activity to see all data.    Tobacco Social History   Tobacco Use  Smoking Status Never Smoker  Smokeless Tobacco Never Used     Counseling given: Not Answered   Clinical Intake:  Pre-visit preparation completed: Yes  Pain : 0-10 Pain Score: 6  Pain Type: Acute pain Pain Location: Back Pain Orientation: Right Pain Radiating Towards: down right leg Pain Descriptors / Indicators: Shooting, Sharp Pain Onset: 1 to 4 weeks ago Pain Frequency: Constant Pain Relieving Factors: tramadol helps some  Pain Relieving Factors: tramadol helps some  Nutritional Status: BMI 25 -29 Overweight Nutritional Risks: Nausea/ vomitting/ diarrhea(has ulcerative colitis) Diabetes: No  How often do you need to have someone help you when you read instructions, pamphlets, or other written materials from your doctor or pharmacy?: 1 - Never What is the last grade level you completed in school?: 2 years college  Interpreter Needed?: No  Information entered by :: NAllen LPN  Past Medical History:  Diagnosis Date  . Anemia 1998   . Anxiety   . Arthritis    big toe   . Asthma   . BRCA1 negative   . BRCA2 negative    . Breast cancer ([place]) 2006   chem/radiation/2years tamoxifen  . Chronic neck and back pain   . Crohn's disease ([place])   . Depression   . Endometriosis   . Fibroid   . GERD (gastroesophageal reflux disease)   . History of chemotherapy 01/2005  . Hx of tamoxifen therapy   . Hx of transfusion of packed red blood cells   . IBS (irritable bowel syndrome)   . Kidney stones   . Lymphedema of arm   . Migraines    just at dx of breast CA  . Mucinous cystadenoma of ovary    left  . Neuropathy   . Paresthesias   . Peripheral neuropathy   . Radiation 04/2005  . Stroke [facility])    related to when taken compazine  . Ulcerative colitis ([place])    with chronic diarrhea  . Urinary tract infection    x 3  . Vertigo 2014  . Vitamin D deficiency    Past Surgical History:  Procedure Laterality Date  . ABDOMINAL HYSTERECTOMY  2007   TAH/BSO  . BREAST SURGERY  2006   LUMPECTOMY  . CHOLECYSTECTOMY    . CHOLECYSTECTOMY N/A 04/29/2014   Procedure: LAPAROSCOPIC CHOLECYSTECTOMY WITH INTRAOPERATIVE CHOLANGIOGRAM;  Surgeon: Edward Jolly, MD;  Location: [facility] ORS;  Service: General;  Laterality: N/A;  . COLONOSCOPY  June 2013   Dr. Benson Norway: sessile serrated adenoma  . COLONOSCOPY WITH PROPOFOL N/A 03/13/2015  Procedure: COLONOSCOPY WITH PROPOFOL;  Surgeon: [name], MD;  Location: [facility] ENDOSCOPY;  Service: Endoscopy;  Laterality: N/A;  . DILATION AND CURETTAGE OF UTERUS    . FLEXIBLE SIGMOIDOSCOPY N/A 07/18/2014   Dr. Benson Norway: mild left-sided colitis, biopsies with quiescent UC  . MYOMECTOMY    . TONSILLECTOMY     Family History  Problem Relation Age of Onset  . Breast cancer Mother 63  . Heart failure Father   . Heart disease Father   . Cancer Maternal Grandmother        COLON  . Heart failure Paternal Grandmother   . Heart disease Paternal Grandmother    Social History   Socioeconomic History  . Marital status: Significant Other    Spouse name: Not on file  . Number of children: Not on  file  . Years of education: Not on file  . Highest education level: Not on file  Occupational History  . 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 Financial resource strain: Not hard at all  . Food insecurity    Worry: Never true    Inability: Never true  . Transportation needs    Medical: No    Non-medical: No  Tobacco Use  . Smoking status: Never Smoker  . Smokeless tobacco: Never Used  Substance and Sexual Activity  . Alcohol use: No  . Drug use: No  . Sexual activity: Not Currently    Birth control/protection: None, Surgical    Comment: HYST  Lifestyle  . Physical activity    Days per week: 7 days    Minutes per session: 30 min  . Stress: Not at all  Relationships  . Social Herbalist on phone: Not on file    Gets together: Not on file    Attends religious service: Not on file    Active member of club or organization: Not on file    Attends meetings of clubs or organizations: Not on file    Relationship status: Not on file  Other Topics Concern  . Not on file  Social History Narrative   Patient lives at home with her nephew.    Patient has 2 years of college education.    Patient has 0 children.    Patient has a boyfriend.           Outpatient Encounter Medications as of 06/04/2019  Medication Sig  . amitriptyline (ELAVIL) 25 MG tablet Take 25 mg by mouth at bedtime.   . beclomethasone (QVAR) 80 MCG/ACT inhaler Inhale 1 puff into the lungs daily as needed (Shortness of Breath).  . celecoxib (CELEBREX) 200 MG capsule Take 200 mg by mouth every evening.  . cetirizine (ZYRTEC) 10 MG tablet Take 10 mg by mouth daily.  . cyanocobalamin (,VITAMIN B-12,) 1000 MCG/ML injection Inject 1,000 mcg into the muscle every 30 (thirty) days.  [name] dicyclomine (BENTYL) 20 MG tablet Take 20 mg by mouth at bedtime.   . diphenhydrAMINE (BENADRYL) 25 mg capsule Take 25 mg by mouth daily as needed for itching.  .  folic acid (FOLVITE) 1 MG tablet Take 2 mg by mouth daily.  [name] gabapentin (NEURONTIN) 300 MG capsule Take 1 capsule (300 mg total) by mouth 3 (three) times daily.  [name] inFLIXimab (REMICADE) 100 MG injection Inject 100 mg into the vein every 30 (thirty) days. Infuse by intravenous route every 4 weeks over no less than   . loperamide (  IMODIUM) 2 MG capsule Take 1 capsule (2 mg total) by mouth 4 (four) times daily as needed for diarrhea or loose stools.  . meloxicam (MOBIC) 15 MG tablet Take 15 mg by mouth daily.  . methotrexate (50 MG/ML) 1 g injection Inject into the vein every Monday. 0.24m every Monday  . olmesartan (BENICAR) 20 MG tablet TAKE 1 TABLET (20MG TOTAL) BY MOUTH ONCE DAILY (Patient not taking: Reported on 06/04/2019)  . ondansetron (ZOFRAN) 4 MG tablet Take 4 mg by mouth every 6 (six) hours as needed for nausea or vomiting.  . traMADol (ULTRAM) 50 MG tablet Take 50 mg by mouth every 6 (six) hours as needed for moderate pain.    No facility-administered encounter medications on file as of 06/04/2019.     Activities of Daily Living In your present state of health, do you have any difficulty performing the following activities: 06/04/2019 06/04/2019  Hearing? N N  Vision? N N  Comment has difficulty with small print, wears readers -  Difficulty concentrating or making decisions? N N  Walking or climbing stairs? Y N  Comment sometimes with pain in back and legs -  Dressing or bathing? N N  Doing errands, shopping? N N  Preparing Food and eating ? N -  Using the Toilet? N -  In the past six months, have you accidently leaked urine? N -  Do you have problems with loss of bowel control? N -  Managing your Medications? N -  Managing your Finances? N -  Housekeeping or managing your Housekeeping? N -  Some recent data might be hidden    Patient Care Team: MMinette Brine FNP as PCP - General ([place] HGavin Pound MD as Consulting Physician (Rheumatology)    Assessment:    This is a routine wellness examination for [place]  Exercise Activities and Dietary recommendations Current Exercise Habits: Home exercise routine, Type of exercise: walking, Time (Minutes): 30, Frequency (Times/Week): 7, Weekly Exercise (Minutes/Week): 210  Goals    . Patient Stated     06/04/2019, wants to survive ulcerative colitis       Fall Risk Fall Risk  06/04/2019 06/04/2019  Falls in the past year? 1 1  Number falls in past yr: 0 0  Injury with Fall? 0 0  Risk for fall due to : History of fall(s);Medication side effect -  Follow up Falls evaluation completed;Education provided;Falls prevention discussed -   Is the patient's home free of loose throw rugs in walkways, pet beds, electrical cords, etc?   yes      Grab bars in the bathroom? no      Handrails on the stairs?   yes      Adequate lighting?   yes  Timed Get Up and Go performed: n/a  Depression Screen PHQ 2/9 Scores 06/04/2019 06/04/2019  PHQ - 2 Score 0 0  PHQ- 9 Score 9 -     Cognitive Function        Immunization History  Administered Date(s) Administered  . Influenza-Unspecified 08/09/2018    Qualifies for Shingles Vaccine? yes  Screening Tests Health Maintenance  Topic Date Due  . HIV Screening  10/22/1982  . TETANUS/TDAP  10/22/1986  . PAP SMEAR-Modifier  12/23/2011  . INFLUENZA VACCINE  04/06/2019  . MAMMOGRAM  04/09/2020  . COLONOSCOPY  03/12/2025    Cancer Screenings: Lung: Low Dose CT Chest recommended if Age 51-80years, 30 pack-year currently smoking OR have quit w/in 15years. Patient does not qualify. Breast:  Up to date on Mammogram? Yes   Up to date of Bone Density/Dexa? Yes Colorectal: up to date  Additional Screenings: : Hepatitis C Screening: n/a     Plan:    Patient wants to survive ulcerative colitis. Patient declined 6 CIT screening.   I have personally reviewed and noted the following in the patient's chart:   . Medical and social history . Use of alcohol,  tobacco or illicit drugs  . Current medications and supplements . Functional ability and status . Nutritional status . Physical activity . Advanced directives . List of other physicians . Hospitalizations, surgeries, and ER visits in previous 12 months . Vitals . Screenings to include cognitive, depression, and falls . Referrals and appointments  In addition, I have reviewed and discussed with patient certain preventive protocols, quality metrics, and best practice recommendations. A written personalized care plan for preventive services as well as general preventive health recommendations were provided to patient.     Kellie Simmering, LPN  03/17/4579

## 2019-06-04 NOTE — Patient Instructions (Signed)

## 2019-06-04 NOTE — Progress Notes (Signed)
Subjective:     Patient ID: Gabrielle Hawkins , female    DOB: 1966/05/05 , 53 y.o.   MRN: [id number]   Chief Complaint  Patient presents with  . Hypertension    HPI  Rheumatologist sent her to Dr. Krista Blue - had EMG/NCV due to decreased feeling on right leg and foot.  She is having low back pain.  She is taking 900 mg gabapentin at bedtime and the elavil.   Hypertension This is a chronic problem. The current episode started more than 1 year ago. The problem is unchanged. The problem is controlled. Pertinent negatives include no anxiety, chest pain, headaches or palpitations. There are no associated agents to hypertension. Risk factors for coronary artery disease include obesity and sedentary lifestyle. Past treatments include alpha 1 blockers. There are no compliance problems.  There is no history of angina. There is no history of chronic renal disease.  Insomnia The problem has been gradually worsening since onset.     Past Medical History:  Diagnosis Date  . Anemia 1998   . Anxiety   . Arthritis    big toe   . Asthma   . BRCA1 negative   . BRCA2 negative   . Breast cancer ([place]) 2006   chem/radiation/2years tamoxifen  . Chronic neck and back pain   . Crohn's disease ([place])   . Depression   . Endometriosis   . Fibroid   . GERD (gastroesophageal reflux disease)   . History of chemotherapy 01/2005  . Hx of tamoxifen therapy   . Hx of transfusion of packed red blood cells   . IBS (irritable bowel syndrome)   . Kidney stones   . Lymphedema of arm   . Migraines    just at dx of breast CA  . Mucinous cystadenoma of ovary    left  . Neuropathy   . Paresthesias   . Peripheral neuropathy   . Radiation 04/2005  . Stroke [facility])    related to when taken compazine  . Ulcerative colitis ([place])    with chronic diarrhea  . Urinary tract infection    x 3  . Vertigo 2014  . Vitamin D deficiency      Family History  Problem Relation Age of Onset  . Breast cancer Mother 7  .  Heart failure Father   . Heart disease Father   . Cancer Maternal Grandmother        COLON  . Heart failure Paternal Grandmother   . Heart disease Paternal Grandmother      Current Outpatient Medications:  .  amitriptyline (ELAVIL) 25 MG tablet, Take 25 mg by mouth at bedtime. , Disp: , Rfl: 4 .  beclomethasone (QVAR) 80 MCG/ACT inhaler, Inhale 1 puff into the lungs daily as needed (Shortness of Breath)., Disp: , Rfl:  .  cetirizine (ZYRTEC) 10 MG tablet, Take 10 mg by mouth daily., Disp: , Rfl:  .  folic acid (FOLVITE) 1 MG tablet, Take 2 mg by mouth daily., Disp: , Rfl:  .  gabapentin (NEURONTIN) 300 MG capsule, Take 1 capsule (300 mg total) by mouth 3 (three) times daily., Disp: 90 capsule, Rfl: 11 .  inFLIXimab (REMICADE) 100 MG injection, Inject 100 mg into the vein every 30 (thirty) days. Infuse by intravenous route every 4 weeks over no less than , Disp: , Rfl:  .  loperamide (IMODIUM) 2 MG capsule, Take 1 capsule (2 mg total) by mouth 4 (four) times daily as needed for diarrhea or loose  stools., Disp: 12 capsule, Rfl: 0 .  meloxicam (MOBIC) 15 MG tablet, Take 15 mg by mouth daily., Disp: , Rfl:  .  methotrexate (50 MG/ML) 1 g injection, Inject into the vein every Monday. 0.57m every Monday, Disp: , Rfl:  .  ondansetron (ZOFRAN) 4 MG tablet, Take 4 mg by mouth every 6 (six) hours as needed for nausea or vomiting., Disp: , Rfl:  .  tiZANidine (ZANAFLEX) 2 MG tablet, Take 2 mg by mouth 3 (three) times daily., Disp: , Rfl:  .  traMADol (ULTRAM) 50 MG tablet, Take 50 mg by mouth every 6 (six) hours as needed for moderate pain. , Disp: , Rfl:  .  celecoxib (CELEBREX) 200 MG capsule, Take 200 mg by mouth every evening., Disp: , Rfl:  .  cyanocobalamin (,VITAMIN B-12,) 1000 MCG/ML injection, Inject 1,000 mcg into the muscle every 30 (thirty) days., Disp: , Rfl:  .  dicyclomine (BENTYL) 20 MG tablet, Take 20 mg by mouth at bedtime. , Disp: , Rfl:  .  diphenhydrAMINE (BENADRYL) 25 mg capsule,  Take 25 mg by mouth daily as needed for itching., Disp: , Rfl:  .  diptheria-tetanus toxoids (DECAVAC) 2-2 LF/0.5ML injection, Inject 0.5 mLs into the muscle once for 1 dose., Disp: 0.5 mL, Rfl: 0 .  olmesartan (BENICAR) 20 MG tablet, TAKE 1 TABLET (20MG TOTAL) BY MOUTH ONCE DAILY (Patient not taking: Reported on 06/04/2019), Disp: 90 tablet, Rfl: 0   Allergies  Allergen Reactions  . Humira [Adalimumab] Anaphylaxis  . Ampicillin Hives and Itching  . Codeine   . Compazine [Prochlorperazine Maleate] Other (See Comments)    "Stroke-like symptoms" Can tolerate Phenergan.  .Marland KitchenCymbalta [Duloxetine Hcl] Other (See Comments)    Fainting  . Imitrex [Sumatriptan Base] Hives and Nausea And Vomiting  . Lyrica [Pregabalin] Other (See Comments)    Fainting  . Metoclopramide Hcl Hives  . Percocet [Oxycodone-Acetaminophen] Itching  . Effexor [Venlafaxine Hydrochloride] Hives and Palpitations  . Tape Itching and Rash     Review of Systems  Constitutional: Negative.   Respiratory: Negative.  Negative for cough.   Cardiovascular: Negative for chest pain, palpitations and leg swelling.  Neurological: Negative for dizziness and headaches.  Psychiatric/Behavioral: Negative for agitation and confusion. The patient has insomnia.      Today's Vitals   06/04/19 0919  BP: 120/60  Pulse: 88  Temp: 97.7 F (36.5 C)  TempSrc: Oral  Weight: 192 lb 6.4 oz (87.3 kg)  Height: 5' 7.4" (1.712 m)  PainSc: 6   PainLoc: Back   Body mass index is 29.78 kg/m.   Objective:  Physical Exam Vitals signs reviewed.  Constitutional:      Appearance: Normal appearance.  Eyes:     Pupils: Pupils are equal, round, and reactive to light.  Cardiovascular:     Rate and Rhythm: Normal rate and regular rhythm.     Pulses: Normal pulses.     Heart sounds: Normal heart sounds. No murmur.  Pulmonary:     Effort: Pulmonary effort is normal. No respiratory distress.     Breath sounds: Normal breath sounds.  Skin:     General: Skin is warm and dry.     Capillary Refill: Capillary refill takes less than 2 seconds.  Neurological:     General: No focal deficit present.     Mental Status: She is alert.  Psychiatric:        Mood and Affect: Mood normal.        Behavior: Behavior  normal.        Thought Content: Thought content normal.        Judgment: Judgment normal.         Assessment And Plan:     1. Essential hypertension . B/P is controlled.  . CMP ordered to check renal function.  . The importance of regular exercise and dietary modification was stressed to the patient.  . Stressed importance of losing ten percent of her body weight to help with B/P control.  . The weight loss would help with decreasing cardiac and cancer risk as well.   2. Insomnia, unspecified type  She is averaging 2-3 hours of sleep   I will provide her with a sleep aid she is advised to avoid alcohol and to not drive when taking this medication - temazepam (RESTORIL) 7.5 MG capsule; Take 1 capsule (7.5 mg total) by mouth at bedtime as needed for sleep.  Dispense: 30 capsule; Refill: 0  3. Other ulcerative colitis without complication (HCC)  Chronic, continue follow up with GI  4. Acute right-sided low back pain without sciatica  She is being managed by Dr. Krista Blue and is awaiting an MRI I advised her to call their office to follow up  5. Need for influenza vaccination  Influenza vaccine given in office  Advised to take Tylenol as needed for muscle aches or fever - Flu Vaccine QUAD 6+ mos PF IM (Fluarix Quad PF)   [name], FNP    THE PATIENT IS ENCOURAGED TO PRACTICE SOCIAL DISTANCING DUE TO THE COVID-19 PANDEMIC.

## 2019-06-07 DIAGNOSIS — K5190 Ulcerative colitis, unspecified, without complications: Secondary | ICD-10-CM | POA: Diagnosis not present

## 2019-06-19 DIAGNOSIS — R69 Illness, unspecified: Secondary | ICD-10-CM | POA: Diagnosis not present

## 2019-07-02 ENCOUNTER — Ambulatory Visit: Payer: Self-pay | Admitting: Neurology

## 2019-07-05 DIAGNOSIS — K5190 Ulcerative colitis, unspecified, without complications: Secondary | ICD-10-CM | POA: Diagnosis not present

## 2019-07-23 ENCOUNTER — Other Ambulatory Visit: Payer: Self-pay | Admitting: Nurse Practitioner

## 2019-07-23 DIAGNOSIS — G4700 Insomnia, unspecified: Secondary | ICD-10-CM

## 2019-07-23 DIAGNOSIS — I10 Essential (primary) hypertension: Secondary | ICD-10-CM

## 2019-07-24 NOTE — Telephone Encounter (Signed)
Temazepam refill

## 2019-07-31 ENCOUNTER — Ambulatory Visit: Payer: Medicare HMO | Admitting: Nurse Practitioner

## 2019-08-08 DIAGNOSIS — K5190 Ulcerative colitis, unspecified, without complications: Secondary | ICD-10-CM | POA: Diagnosis not present

## 2019-09-17 DIAGNOSIS — R69 Illness, unspecified: Secondary | ICD-10-CM | POA: Diagnosis not present

## 2019-10-02 DIAGNOSIS — K5190 Ulcerative colitis, unspecified, without complications: Secondary | ICD-10-CM | POA: Diagnosis not present

## 2019-10-03 DIAGNOSIS — M459 Ankylosing spondylitis of unspecified sites in spine: Secondary | ICD-10-CM | POA: Diagnosis not present

## 2019-10-03 DIAGNOSIS — K5100 Ulcerative (chronic) pancolitis without complications: Secondary | ICD-10-CM | POA: Diagnosis not present

## 2019-10-03 DIAGNOSIS — M2550 Pain in unspecified joint: Secondary | ICD-10-CM | POA: Diagnosis not present

## 2019-10-03 DIAGNOSIS — Z79899 Other long term (current) drug therapy: Secondary | ICD-10-CM | POA: Diagnosis not present

## 2019-10-09 DIAGNOSIS — K5190 Ulcerative colitis, unspecified, without complications: Secondary | ICD-10-CM | POA: Diagnosis not present

## 2019-11-06 DIAGNOSIS — K5190 Ulcerative colitis, unspecified, without complications: Secondary | ICD-10-CM | POA: Diagnosis not present

## 2019-11-13 ENCOUNTER — Telehealth: Payer: Self-pay

## 2019-11-13 ENCOUNTER — Other Ambulatory Visit: Payer: Self-pay | Admitting: Nurse Practitioner

## 2019-11-13 DIAGNOSIS — G4700 Insomnia, unspecified: Secondary | ICD-10-CM

## 2019-11-13 DIAGNOSIS — I10 Essential (primary) hypertension: Secondary | ICD-10-CM

## 2019-11-13 NOTE — Telephone Encounter (Signed)
Pt needs an appointment for further refills

## 2019-11-13 NOTE — Telephone Encounter (Signed)
Called pt to schedule appointment for refills for bp meds and temazepam. Explained to pt that we needed to see her in office for further refills. Pt did not want to make an appointment at this time. Sent #30 of blood pressure medication until pt makes appt.

## 2019-12-05 DIAGNOSIS — K5100 Ulcerative (chronic) pancolitis without complications: Secondary | ICD-10-CM | POA: Diagnosis not present

## 2019-12-09 DIAGNOSIS — N390 Urinary tract infection, site not specified: Secondary | ICD-10-CM | POA: Diagnosis not present

## 2019-12-09 DIAGNOSIS — R319 Hematuria, unspecified: Secondary | ICD-10-CM | POA: Diagnosis not present

## 2019-12-10 MED FILL — GABAPENTIN 300 MG CAPSULE: 300 | 30 days supply | Qty: 90 | Fill #3

## 2019-12-22 DIAGNOSIS — Z008 Encounter for other general examination: Secondary | ICD-10-CM | POA: Diagnosis not present

## 2019-12-22 DIAGNOSIS — E663 Overweight: Secondary | ICD-10-CM | POA: Diagnosis not present

## 2019-12-22 DIAGNOSIS — I739 Peripheral vascular disease, unspecified: Secondary | ICD-10-CM | POA: Diagnosis not present

## 2019-12-22 DIAGNOSIS — M459 Ankylosing spondylitis of unspecified sites in spine: Secondary | ICD-10-CM | POA: Diagnosis not present

## 2019-12-22 DIAGNOSIS — K5190 Ulcerative colitis, unspecified, without complications: Secondary | ICD-10-CM | POA: Diagnosis not present

## 2019-12-22 DIAGNOSIS — B0223 Postherpetic polyneuropathy: Secondary | ICD-10-CM | POA: Diagnosis not present

## 2019-12-22 DIAGNOSIS — I10 Essential (primary) hypertension: Secondary | ICD-10-CM | POA: Diagnosis not present

## 2019-12-22 DIAGNOSIS — G8929 Other chronic pain: Secondary | ICD-10-CM | POA: Diagnosis not present

## 2019-12-22 DIAGNOSIS — Z6828 Body mass index (BMI) 28.0-28.9, adult: Secondary | ICD-10-CM | POA: Diagnosis not present

## 2019-12-22 DIAGNOSIS — R69 Illness, unspecified: Secondary | ICD-10-CM | POA: Diagnosis not present

## 2019-12-22 DIAGNOSIS — K219 Gastro-esophageal reflux disease without esophagitis: Secondary | ICD-10-CM | POA: Diagnosis not present

## 2019-12-24 ENCOUNTER — Other Ambulatory Visit: Payer: Self-pay | Admitting: Nurse Practitioner

## 2019-12-24 DIAGNOSIS — I10 Essential (primary) hypertension: Secondary | ICD-10-CM

## 2020-01-02 DIAGNOSIS — K5190 Ulcerative colitis, unspecified, without complications: Secondary | ICD-10-CM | POA: Diagnosis not present

## 2020-01-22 ENCOUNTER — Other Ambulatory Visit: Payer: Self-pay | Admitting: Nurse Practitioner

## 2020-01-22 DIAGNOSIS — I10 Essential (primary) hypertension: Secondary | ICD-10-CM

## 2020-01-28 ENCOUNTER — Other Ambulatory Visit: Payer: Self-pay | Admitting: Nurse Practitioner

## 2020-01-28 ENCOUNTER — Encounter: Payer: Self-pay | Admitting: Nurse Practitioner

## 2020-01-28 DIAGNOSIS — I10 Essential (primary) hypertension: Secondary | ICD-10-CM

## 2020-01-30 DIAGNOSIS — K5190 Ulcerative colitis, unspecified, without complications: Secondary | ICD-10-CM | POA: Diagnosis not present

## 2020-01-30 DIAGNOSIS — Z79899 Other long term (current) drug therapy: Secondary | ICD-10-CM | POA: Diagnosis not present

## 2020-02-04 DIAGNOSIS — M549 Dorsalgia, unspecified: Secondary | ICD-10-CM | POA: Diagnosis not present

## 2020-02-04 DIAGNOSIS — M5430 Sciatica, unspecified side: Secondary | ICD-10-CM | POA: Diagnosis not present

## 2020-02-12 ENCOUNTER — Ambulatory Visit: Payer: Medicare HMO | Admitting: Neurology

## 2020-02-12 ENCOUNTER — Other Ambulatory Visit: Payer: Self-pay

## 2020-02-12 ENCOUNTER — Encounter: Payer: Self-pay | Admitting: Neurology

## 2020-02-12 ENCOUNTER — Telehealth: Payer: Self-pay | Admitting: Neurology

## 2020-02-12 VITALS — BP 138/87 | HR 77 | Ht 68.0 in | Wt 190.0 lb

## 2020-02-12 DIAGNOSIS — M5441 Lumbago with sciatica, right side: Secondary | ICD-10-CM | POA: Diagnosis not present

## 2020-02-12 MED ORDER — GABAPENTIN 300 MG PO CAPS: 600.0000 mg | ORAL_CAPSULE | Freq: Three times a day (TID) | ORAL | 3 refills | Status: DC

## 2020-02-12 NOTE — Patient Instructions (Addendum)
I will get MRI of lumbar spine Increase gabapentin 600 mg 3 times a day  Continue other medications Let's consider physical therapy in the future See you back in 3 months

## 2020-02-12 NOTE — Progress Notes (Signed)
PATIENT: VERYL ABRIL DOB: [birth year]  REASON FOR VISIT: follow up HISTORY FROM: patient  HISTORY OF PRESENT ILLNESS: Today 02/12/20  HISTORY  MAO LOCKNER is a 54 year old female, seen in request by her rheumatologist Dr. Gavin Pound for evaluation of bilateral upper and lower extremity paresthesia.  I have reviewed and summarized the referring note from the referring physician.  She had a history of right breast cancer, status post right lobectomy followed by chemotherapy with adriamycin, Cytoxan, and radiation therapy in 2006.  She was also diagnosed with Crohn's disease since 2014, is currently receiving Remicade and methotrexate treatment.  I saw her previously in 2014 complains of bilateral hands pain, muscle spasm, low back pain, radiating pain to bilateral lower extremity.  EMG nerve conduction study was normal, there was no evidence of cervical radiculopathy, bilateral upper extremity neuropathy,  I have personally reviewed extensive imaging studies in 2014 MRI of the brain, was essentially normal, MRI of lumbar, cervical spine, mild degenerative changes, no significant canal or foraminal narrowing  Since February 2020, she experience worsening intermittent bilateral lower and upper extremity paresthesia, she described numbness starting from her feet, quickly spreading to below knee level, can last few minutes, sometimes all day, in a bad day, she would have deep achy pain, trouble walking, but not persistent, she continue complains of low back pain, radiating pain to bilateral lower extremity, no bowel and bladder incontinence, she denies significant neck pain, bilateral hands paresthesia usually happen independent of her lower extremity paresthesia, sometimes she felt clumsiness of her hands, difficulty tying her shoes  UPDATE May 06 2019: She continues to complains of right foot and leg paresthesia, numbness, also complains of right-sided low  back pain, radiating pain to right lower extremity, she denies significant gait abnormality, sometimes her legs give out underneath her make her fall, gabapentin 300 mg 3 times daily was helpful, but she complains of dizziness during the day  Personally reviewed MRI of the brain without contrast April 29, 2019 that was normal.    Update February 12, 2020 SS: When last seen, MRI of lumbar spine was ordered, was not completed.   Nerve conduction in July 2020 was essentially normal, no evidence of large fiber peripheral neuropathy or right-sided lumbosacral radiculopathy.  Mildly decreased snap amplitude, prolonged peak latency at bilateral superficial peroneal sensory responses, could be due to technical difficulty with cold limb temperature.  Continues to complain of right sided low back pain, radiating down right hip and leg, has been intermittent for at least 1 year. For the last 2 weeks, has been flaring, unclear trigger?  She went to urgent care, back pain so significant was difficult for her to walk bearing weight on the right, radiating down the right leg.  She says x-ray was done, was told she had DDD.  Was treated with 6-day prednisone taper, steroid injection in office.  Started on meloxicam.  Currently taking gabapentin 300 mg 3 times a day, tizanidine 2 mg 3 times a day, tramadol as needed, also on Celebrex and amitriptyline.  Continues to complain of numbness in her toes, no pain, "they feel funny".  No recent falls, no urinary or bowel incontinence.  Currently on disability, has tried lidocaine patches without benefit.  REVIEW OF SYSTEMS: Out of a complete 14 system review of symptoms, the patient complains only of the following symptoms, and all other reviewed systems are negative.  Back pain  ALLERGIES: Allergies  Allergen Reactions  . Humira [Adalimumab] Anaphylaxis  .  Ampicillin Hives and Itching  . Codeine   . Compazine [Prochlorperazine Maleate] Other (See Comments)     "Stroke-like symptoms" Can tolerate Phenergan.  [name] Cymbalta [Duloxetine Hcl] Other (See Comments)    Fainting  . Imitrex [Sumatriptan Base] Hives and Nausea And Vomiting  . Lyrica [Pregabalin] Other (See Comments)    Fainting  . Metoclopramide Hcl Hives  . Percocet [Oxycodone-Acetaminophen] Itching  . Effexor [Venlafaxine Hydrochloride] Hives and Palpitations  . Tape Itching and Rash    HOME MEDICATIONS: Outpatient Medications Prior to Visit  Medication Sig Dispense Refill  . amitriptyline (ELAVIL) 25 MG tablet Take 25 mg by mouth at bedtime.   4  . beclomethasone (QVAR) 80 MCG/ACT inhaler Inhale 1 puff into the lungs daily as needed (Shortness of Breath).    . celecoxib (CELEBREX) 200 MG capsule Take 200 mg by mouth every evening.    . cetirizine (ZYRTEC) 10 MG tablet Take 10 mg by mouth daily.    . cyanocobalamin (,VITAMIN B-12,) 1000 MCG/ML injection Inject 1,000 mcg into the muscle every 30 (thirty) days.    [name] dicyclomine (BENTYL) 20 MG tablet Take 20 mg by mouth at bedtime.     . diphenhydrAMINE (BENADRYL) 25 mg capsule Take 25 mg by mouth daily as needed for itching.    . folic acid (FOLVITE) 1 MG tablet Take 2 mg by mouth daily.    [name] inFLIXimab (REMICADE) 100 MG injection Inject 100 mg into the vein every 30 (thirty) days. Infuse by intravenous route every 4 weeks over no less than     . loperamide (IMODIUM) 2 MG capsule Take 1 capsule (2 mg total) by mouth 4 (four) times daily as needed for diarrhea or loose stools. 12 capsule 0  . meloxicam (MOBIC) 15 MG tablet Take 15 mg by mouth daily.    . methotrexate (50 MG/ML) 1 g injection Inject into the vein every Monday. 0.27m every Monday    . olmesartan (BENICAR) 20 MG tablet Take 1 tablet by mouth once daily 30 tablet 0  . ondansetron (ZOFRAN) 4 MG tablet Take 4 mg by mouth every 6 (six) hours as needed for nausea or vomiting.    . temazepam (RESTORIL) 7.5 MG capsule TAKE 1 CAPSULE BY MOUTH AT BEDTIME AS NEEDED FOR SLEEP 30 capsule  0  . tiZANidine (ZANAFLEX) 2 MG tablet Take 2 mg by mouth 3 (three) times daily.    . traMADol (ULTRAM) 50 MG tablet Take 50 mg by mouth every 6 (six) hours as needed for moderate pain.     .Marland Kitchengabapentin (NEURONTIN) 300 MG capsule Take 1 capsule (300 mg total) by mouth 3 (three) times daily. 90 capsule 11   No facility-administered medications prior to visit.    PAST MEDICAL HISTORY: Past Medical History:  Diagnosis Date  . Anemia 1998   . Anxiety   . Arthritis    big toe   . Asthma   . BRCA1 negative   . BRCA2 negative   . [place]   chem/radiation/2years tamoxifen  . Chronic neck and back pain   . [place]   . Depression   . Endometriosis   . Fibroid   . GERD (gastroesophageal reflux disease)   . History of chemotherapy 01/2005  . Hx of tamoxifen therapy   . Hx of transfusion of packed red blood cells   . IBS (irritable bowel syndrome)   . Kidney stones   . Lymphedema of arm   . Migraines  just at dx of breast CA  . Mucinous cystadenoma of ovary    left  . Neuropathy   . Paresthesias   . Peripheral neuropathy   . Radiation 04/2005  . Stroke [facility])    related to when taken compazine  . Ulcerative colitis ([place])    with chronic diarrhea  . Urinary tract infection    x 3  . Vertigo 2014  . Vitamin D deficiency     PAST SURGICAL HISTORY: Past Surgical History:  Procedure Laterality Date  . ABDOMINAL HYSTERECTOMY  2007   TAH/BSO  . BREAST SURGERY  2006   LUMPECTOMY  . CHOLECYSTECTOMY    . CHOLECYSTECTOMY N/A 04/29/2014   Procedure: LAPAROSCOPIC CHOLECYSTECTOMY WITH INTRAOPERATIVE CHOLANGIOGRAM;  Surgeon: Edward Jolly, MD;  Location: [facility];  Service: General;  Laterality: N/A;  . COLONOSCOPY  June 2013   Dr. Benson Norway: sessile serrated adenoma  . COLONOSCOPY WITH PROPOFOL N/A 03/13/2015   Procedure: COLONOSCOPY WITH PROPOFOL;  Surgeon: [name], MD;  Location: [facility] END[facility];  Service: Endoscopy;  Laterality: N/A;  . DILATION AND  CURETTAGE OF UTERUS    . FLEXIBLE SIGMOIDOSCOPY N/A 07/18/2014   Dr. Benson Norway: mild left-sided colitis, biopsies with quiescent UC  . MYOMECTOMY    . TONSILLECTOMY      FAMILY HISTORY: Family History  Problem Relation Age of Onset  . Breast cancer Mother 46  . Heart failure Father   . Heart disease Father   . Cancer Maternal Grandmother        COLON  . Heart failure Paternal Grandmother   . Heart disease Paternal Grandmother     SOCIAL HISTORY: Social History   Socioeconomic History  . Marital status: Significant Other    Spouse name: Not on file  . Number of children: Not on file  . Years of education: Not on file  . Highest education level: Not on file  Occupational History  . 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 Smoking status: Never Smoker  . Smokeless tobacco: Never Used  Substance and Sexual Activity  . Alcohol use: No  . Drug use: No  . Sexual activity: Not Currently    Birth control/protection: None, Surgical    Comment: HYST  Other Topics Concern  . Not on file  Social History Narrative   Patient lives at home with her nephew.    Patient has 2 years of college education.    Patient has 0 children.    Patient has a boyfriend.          Social Determinants of Health   Financial Resource Strain: Low Risk   . Difficulty of Paying Living Expenses: Not hard at all  Food Insecurity: No Food Insecurity  . Worried About Charity fundraiser in the Last Year: Never true  . Ran Out of Food in the Last Year: Never true  Transportation Needs: No Transportation Needs  . Lack of Transportation (Medical): No  . Lack of Transportation (Non-Medical): No  Physical Activity: Sufficiently Active  . Days of Exercise per Week: 7 days  . Minutes of Exercise per Session: 30 min  Stress: No Stress Concern Present  . Feeling of Stress : Not at all  Social Connections:   . Frequency of Communication with Friends and  Family:   . Frequency of Social Gatherings with Friends and Family:   . Attends Religious Services:   . Active Member of Clubs or  Organizations:   . Attends Archivist Meetings:   [name] Marital Status:   Intimate Partner Violence: Not At Risk  . Fear of Current or Ex-Partner: No  . Emotionally Abused: No  . Physically Abused: No  . Sexually Abused: No   PHYSICAL EXAM  Vitals:   02/12/20 1109  BP: 138/87  Pulse: 77  Weight: 190 lb (86.2 kg)  Height: 5' 8" (1.727 m)   Body mass index is 28.89 kg/m.  Generalized: Well developed, in no acute distress   Neurological examination  Mentation: Alert oriented to time, place, history taking. Follows all commands speech and language fluent Cranial nerve II-XII: Pupils were equal round reactive to light. Extraocular movements were full, visual field were full on confrontational test. Facial sensation and strength were normal. Head turning and shoulder shrug  were normal and symmetric. Motor: Good strength of all extremities, 4/5 with right hip flexion, no dorsiflexion weakness Sensory: reports decreased soft touch sensation to right calf, normal to right thigh Coordination: Cerebellar testing reveals good finger-nose-finger and heel-to-shin bilaterally.  Gait and station: Slight limp on the right to bear weight, no assistive device Reflexes: Deep tendon reflexes are symmetric and normal bilaterally.   DIAGNOSTIC DATA (LABS, IMAGING, TESTING) - I reviewed patient records, labs, notes, testing and imaging myself where available.  Lab Results  Component Value Date   WBC 6.4 10/06/2018   HGB 12.2 10/06/2018   HCT 39.2 10/06/2018   MCV 93.1 10/06/2018   PLT 194 10/06/2018      Component Value Date/Time   NA 139 10/06/2018 2225   NA 139 09/21/2012 1140   K 3.6 10/06/2018 2225   K 3.5 09/21/2012 1140   CL 107 10/06/2018 2225   CL 107 09/21/2012 1140   CO2 24 10/06/2018 2225   CO2 22 09/21/2012 1140   GLUCOSE 112 (H)  10/06/2018 2225   GLUCOSE 101 (H) 09/21/2012 1140   BUN 18 10/06/2018 2225   BUN 17.0 09/21/2012 1140   CREATININE 0.86 10/06/2018 2225   CREATININE 0.9 09/21/2012 1140   CALCIUM 9.0 10/06/2018 2225   CALCIUM 9.6 09/21/2012 1140   PROT 6.9 10/06/2018 2225   PROT 7.8 09/21/2012 1140   ALBUMIN 3.7 10/06/2018 2225   ALBUMIN 3.8 09/21/2012 1140   AST 14 (L) 10/06/2018 2225   AST 12 09/21/2012 1140   ALT 12 10/06/2018 2225   ALT 11 09/21/2012 1140   ALKPHOS 73 10/06/2018 2225   ALKPHOS 85 09/21/2012 1140   BILITOT 0.5 10/06/2018 2225   BILITOT 0.58 09/21/2012 1140   GFRNONAA >60 10/06/2018 2225   GFRAA >60 10/06/2018 2225   Lab Results  Component Value Date   CHOL 188 12/24/2009   HDL 54 12/24/2009   LDLCALC 124 (H) 12/24/2009   TRIG 49 12/24/2009   CHOLHDL 3.5 12/24/2009   No results found for: HGBA1C Lab Results  Component Value Date   VITAMINB12 150 (L) 02/14/2013   Lab Results  Component Value Date   TSH 2.160 03/22/2013      ASSESSMENT AND PLAN 54 y.o. year old female  has a past medical history of Anemia (1998 ), Anxiety, Arthritis, Asthma, BRCA1 negative, BRCA2 negative, Breast cancer ([place]) (2006), Chronic neck and back pain, Crohn's disease ([place]), Depression, Endometriosis, Fibroid, GERD (gastroesophageal reflux disease), History of chemotherapy (01/2005), tamoxifen therapy, transfusion of packed red blood cells, IBS (irritable bowel syndrome), Kidney stones, Lymphedema of arm, Migraines, Mucinous cystadenoma of ovary, Neuropathy, Paresthesias, Peripheral neuropathy, Radiation (04/2005), Stroke ([place]), Ulcerative  colitis ([place]), Urinary tract infection, Vertigo (2014), and Vitamin D deficiency. here with:  1.  Right-sided low back pain radiating to right leg -Will reorder MRI of the lumbar spine (was ordered at last visit, but was not completed) -Increase gabapentin 600 mg 3 times a day -Already taking meloxicam, tizanidine, amitriptyline, Celebrex, and  tramadol -Discussed physical therapy, wants to wait for results of MRI, should consider this in the future -Follow-up in 3 months or sooner if needed  I spent 30 minutes of face-to-face and non-face-to-face time with patient.  This included previsit chart review, lab review, study review, order entry, electronic health record documentation, patient education.  Butler Denmark, AGNP-C, DNP 02/12/2020, 12:02 PM [facility] [address], [place], [place] [address] [phone]

## 2020-02-12 NOTE — Telephone Encounter (Signed)
[employer] medicare order sent to [facility]. They will obtain the auth and reach out to the patient to schedule.  °

## 2020-02-13 ENCOUNTER — Ambulatory Visit: Payer: Medicare HMO | Admitting: Nurse Practitioner

## 2020-02-13 NOTE — Telephone Encounter (Signed)
Gabrielle Hawkins: [id number] (exp. 02/13/20 to 08/11/20) patient is scheduled at [facility] for 02/15/20.

## 2020-02-15 ENCOUNTER — Ambulatory Visit
Admission: RE | Admit: 2020-02-15 | Discharge: 2020-02-15 | Disposition: A | Discharge status: Home / Self Care | Payer: Medicare HMO | Source: Ambulatory Visit | Attending: Neurology | Admitting: Neurology

## 2020-02-15 ENCOUNTER — Other Ambulatory Visit: Payer: Self-pay

## 2020-02-15 DIAGNOSIS — M545 Low back pain: Secondary | ICD-10-CM | POA: Diagnosis not present

## 2020-02-15 DIAGNOSIS — M5441 Lumbago with sciatica, right side: Secondary | ICD-10-CM

## 2020-02-17 ENCOUNTER — Telehealth: Payer: Self-pay | Admitting: Neurology

## 2020-02-17 DIAGNOSIS — M5441 Lumbago with sciatica, right side: Secondary | ICD-10-CM

## 2020-02-17 NOTE — Telephone Encounter (Signed)
[employer] medicare order sent to GI. They will obtain the auth and reach out to the patient to schedule.

## 2020-02-17 NOTE — Telephone Encounter (Signed)
Gabrielle Hawkins:  Please make sure that this patient is on schedule for MRI of lumbar w/wo soon

## 2020-02-17 NOTE — Telephone Encounter (Signed)
Please call the patient.  I am going to order MRI of lumbar spine with and without contrast to exclude metastases.  She will need to come by the office before MRI for a creatinine check since contrast will be used.  I saw her in the office on 6/9, continue report of right-sided low back pain radiating down the right leg. We need MRI with contrast to get a better picture of what is going on, could also reflect atypical hemangiomata.  IMPRESSION: T1/T2 hyperintense osseous lesions are increased since prior exam, some of which demonstrate correlative edema. While these may reflect atypical hemangiomata, further evaluation with MRI lumbar spine with and without contrast is recommended to exclude metastases.  Multilevel spondylosis, progressed since 2013. Mild L2-5 spinal canal and neural foraminal narrowing.

## 2020-02-17 NOTE — Telephone Encounter (Signed)
I called the patient, reviewed MRI, she will come by the office in the next few days to have blood work done before contrast.  She has remote history of breast cancer in 2006.

## 2020-02-18 ENCOUNTER — Other Ambulatory Visit (INDEPENDENT_AMBULATORY_CARE_PROVIDER_SITE_OTHER): Payer: Self-pay

## 2020-02-18 DIAGNOSIS — M5441 Lumbago with sciatica, right side: Secondary | ICD-10-CM | POA: Diagnosis not present

## 2020-02-18 DIAGNOSIS — Z0289 Encounter for other administrative examinations: Secondary | ICD-10-CM

## 2020-02-18 NOTE — Telephone Encounter (Signed)
Schedule for 02/20/20 at GI

## 2020-02-19 LAB — BASIC METABOLIC PANEL
BUN/Creatinine Ratio: 15 (ref 9–23)
BUN: 13 mg/dL (ref 6–24)
CO2: 24 mmol/L (ref 20–29)
Calcium: 9.4 mg/dL (ref 8.7–10.2)
Chloride: 108 mmol/L — ABNORMAL HIGH (ref 96–106)
Creatinine, Ser: 0.89 mg/dL (ref 0.57–1.00)
GFR calc Af Amer: 86 mL/min/{1.73_m2} (ref >59)
GFR calc non Af Amer: 75 mL/min/{1.73_m2} (ref >59)
Glucose: 88 mg/dL (ref 65–99)
Potassium: 4.1 mmol/L (ref 3.5–5.2)
Sodium: 145 mmol/L — ABNORMAL HIGH (ref 134–144)

## 2020-02-20 ENCOUNTER — Other Ambulatory Visit: Payer: Medicare HMO

## 2020-02-24 NOTE — Telephone Encounter (Signed)
[employer] medicare Josem Kaufmann: [id number] (exp. 02/21/20 to 08/19/20)

## 2020-02-25 ENCOUNTER — Ambulatory Visit
Admission: RE | Admit: 2020-02-25 | Discharge: 2020-02-25 | Disposition: A | Discharge status: Home / Self Care | Payer: Medicare HMO | Source: Ambulatory Visit | Attending: Neurology | Admitting: Neurology

## 2020-02-25 ENCOUNTER — Ambulatory Visit (INDEPENDENT_AMBULATORY_CARE_PROVIDER_SITE_OTHER): Payer: Medicare HMO

## 2020-02-25 ENCOUNTER — Telehealth: Payer: Self-pay

## 2020-02-25 ENCOUNTER — Other Ambulatory Visit: Payer: Self-pay

## 2020-02-25 DIAGNOSIS — M5441 Lumbago with sciatica, right side: Secondary | ICD-10-CM | POA: Diagnosis not present

## 2020-02-25 DIAGNOSIS — K5180 Other ulcerative colitis without complications: Secondary | ICD-10-CM

## 2020-02-25 DIAGNOSIS — I10 Essential (primary) hypertension: Secondary | ICD-10-CM | POA: Diagnosis not present

## 2020-02-25 DIAGNOSIS — M545 Low back pain: Secondary | ICD-10-CM | POA: Diagnosis not present

## 2020-02-25 MED ORDER — GADOBENATE DIMEGLUMINE 529 MG/ML IV SOLN
17.0000 mL | Freq: Once | INTRAVENOUS | Status: AC | PRN
  Administered 2020-02-25: 17 mL via INTRAVENOUS

## 2020-02-26 ENCOUNTER — Ambulatory Visit (INDEPENDENT_AMBULATORY_CARE_PROVIDER_SITE_OTHER): Payer: Medicare HMO | Admitting: Nurse Practitioner

## 2020-02-26 ENCOUNTER — Encounter: Payer: Self-pay | Admitting: Nurse Practitioner

## 2020-02-26 VITALS — BP 132/94 | HR 96 | Temp 98.1°F | Ht 66.2 in | Wt 182.0 lb

## 2020-02-26 DIAGNOSIS — K5180 Other ulcerative colitis without complications: Secondary | ICD-10-CM | POA: Diagnosis not present

## 2020-02-26 DIAGNOSIS — R630 Anorexia: Secondary | ICD-10-CM

## 2020-02-26 DIAGNOSIS — G4700 Insomnia, unspecified: Secondary | ICD-10-CM

## 2020-02-26 DIAGNOSIS — I10 Essential (primary) hypertension: Secondary | ICD-10-CM | POA: Diagnosis not present

## 2020-02-26 DIAGNOSIS — G8929 Other chronic pain: Secondary | ICD-10-CM | POA: Diagnosis not present

## 2020-02-26 DIAGNOSIS — M5441 Lumbago with sciatica, right side: Secondary | ICD-10-CM | POA: Diagnosis not present

## 2020-02-26 LAB — CBC WITH DIFFERENTIAL/PLATELET
Basophils Absolute: 0 10*3/uL (ref 0.0–0.2)
Basos: 1 %
EOS (ABSOLUTE): 0.2 10*3/uL (ref 0.0–0.4)
Eos: 4 %
Hematocrit: 40.1 % (ref 34.0–46.6)
Hemoglobin: 13 g/dL (ref 11.1–15.9)
Immature Grans (Abs): 0 10*3/uL (ref 0.0–0.1)
Immature Granulocytes: 0 %
Lymphocytes Absolute: 2.4 10*3/uL (ref 0.7–3.1)
Lymphs: 43 %
MCH: 29.7 pg (ref 26.6–33.0)
MCHC: 32.4 g/dL (ref 31.5–35.7)
MCV: 92 fL (ref 79–97)
Monocytes Absolute: 0.4 10*3/uL (ref 0.1–0.9)
Monocytes: 8 %
Neutrophils Absolute: 2.4 10*3/uL (ref 1.4–7.0)
Neutrophils: 44 %
Platelets: 208 10*3/uL (ref 150–450)
RBC: 4.37 x10E6/uL (ref 3.77–5.28)
RDW: 12.5 % (ref 11.7–15.4)
WBC: 5.4 10*3/uL (ref 3.4–10.8)

## 2020-02-26 MED ORDER — OLMESARTAN MEDOXOMIL 20 MG PO TABS: 20.0000 mg | ORAL_TABLET | Freq: Every day | ORAL | 0 refills | Status: DC

## 2020-02-26 NOTE — Chronic Care Management (AMB) (Signed)
Chronic Care Management   Initial Visit Note  02/26/2020 Name: Gabrielle Hawkins MRN: [id number] DOB: 1965/12/29  Referred by: [name], FNP Reason for referral : Initial RN CM Call    Gabrielle Hawkins is a 54 y.o. year old female who is a primary care patient of [name], [place]. The CCM team was consulted for assistance with chronic disease management and care coordination needs related to Essential hypertension, Ulcerative Colitis w/o complication, right-sided low back pain with right-sided sciatica.    Review of patient status, including review of consultants reports, relevant laboratory and other test results, and collaboration with appropriate care team members and the patient's provider was performed as part of comprehensive patient evaluation and provision of chronic care management services.    SDOH (Social Determinants of Health) assessments performed: Yes - no acute needs See Care Plan activities for detailed interventions related to [name] outbound initial CCM RN CM call to patient to assess for CM needs, a care plan was established.     Medications: Outpatient Encounter Medications as of 02/25/2020  Medication Sig Note  . gabapentin (NEURONTIN) 300 MG capsule Take 2 capsules (600 mg total) by mouth 3 (three) times daily.   [name] inFLIXimab (REMICADE) 100 MG injection Inject 100 mg into the vein every 30 (thirty) days. Infuse by intravenous route every 4 weeks  Patient is receiving Inflectra brand verses Remicade brand due to insurance   . inFLIXimab in sodium chloride 0.9 % Inject 10 mg/kg into the vein every 30 (thirty) days. Infuse every 4 weeks   . methotrexate (50 MG/ML) 1 g injection Inject into the vein every Monday. 0.63m every Monday   . traMADol (ULTRAM) 50 MG tablet Take 50 mg by mouth every 6 (six) hours as needed for moderate pain.    .Marland Kitchenamitriptyline (ELAVIL) 25 MG tablet Take 25 mg by mouth at bedtime.    . beclomethasone (QVAR) 80 MCG/ACT  inhaler Inhale 1 puff into the lungs daily as needed (Shortness of Breath).   . celecoxib (CELEBREX) 200 MG capsule Take 200 mg by mouth every evening.   . cetirizine (ZYRTEC) 10 MG tablet Take 10 mg by mouth daily.   .Marland Kitchendicyclomine (BENTYL) 20 MG tablet Take 20 mg by mouth at bedtime.    . diphenhydrAMINE (BENADRYL) 25 mg capsule Take 25 mg by mouth daily as needed for itching.   . folic acid (FOLVITE) 1 MG tablet Take 2 mg by mouth daily.   .Marland Kitchenloperamide (IMODIUM) 2 MG capsule Take 1 capsule (2 mg total) by mouth 4 (four) times daily as needed for diarrhea or loose stools.   . meloxicam (MOBIC) 15 MG tablet Take 15 mg by mouth daily.   .Marland Kitchenolmesartan (BENICAR) 20 MG tablet Take 1 tablet by mouth once daily   . ondansetron (ZOFRAN) 4 MG tablet Take 4 mg by mouth every 6 (six) hours as needed for nausea or vomiting.   . temazepam (RESTORIL) 7.5 MG capsule TAKE 1 CAPSULE BY MOUTH AT BEDTIME AS NEEDED FOR SLEEP   . tiZANidine (ZANAFLEX) 2 MG tablet Take 2 mg by mouth 3 (three) times daily.   . [DISCONTINUED] cyanocobalamin (,VITAMIN B-12,) 1000 MCG/ML injection Inject 1,000 mcg into the muscle every 30 (thirty) days. (Patient not taking: Reported on 02/25/2020) 10/06/2018: 08/2018   No facility-administered encounter medications on file as of 02/25/2020.     Objective:  No results found for: HGBA1C Lab Results  [facility]   [facility]  [facility]   LDLCALC 124 (H) 12/24/2009   CREATININE 0.89 02/18/2020   BP Readings from Last 3 Encounters:  02/12/20 138/87  06/04/19 120/60  06/04/19 120/60    Goals Addressed      Patient Stated   .  "to figure out why I am having urinary retention" (pt-stated)        [place] (see longitudinal plan of care for additional care plan information)  Current Barriers:  [name] Knowledge Deficits related to evaluation and treatment of Impaired Urinary Elimination  . Chronic Disease Management support and education needs related to Essential  hypertension, Ulcerative Colitis w/o complication, right-sided low back pain with right-sided sciatica   . Nurse Case Manager Clinical Goal(s):  [name] Over the next 90 days, patient will work with PCP to address needs related to evaluation and treatment of symptoms of Impaired Urinary Elimination including urinary retention, urinary hesitancy, and urinary urgency   CCM RN CM Interventions:  02/25/20 call completed with patient  . Inter-disciplinary care team collaboration (see longitudinal plan of care) . Evaluation of current treatment plan related to Impaired Urinary Elimination and patient's adherence to plan as established by provider . Determined patient had an Urgent care visit on 12/09/19 due to experiencing left flank pain with the following impression/findings noted: o A large amount of fecal retention is seen throughout the colon. Postop cholecystectomy. Nonobstructive bowel gas pattern. No gross free air. Mild degenerative changes of both hips. No abnormal or urinary tract calculi stones or calculi are seen. Incidentally mild levoscoliosis of the lumbar spine. IMPRESSION: No urinary tract calculi or densities are seen. Large fecal retention . Advised patient to discuss her symptoms of Impaired Urinary Elimination with PCP during next visit scheduled for 02/26/20 for further evaluation and recommendations  . Encouraged patient to contact her Orthopedic MD ASAP to schedule a f/u appointment for further evaluation of her lumbar spinal degenerative disc condition to evaluate for spinal stenosis  . Provided education to patient re: potential complications related to spinal stenosis and the possibility of producing symptoms of intermittent episodes of urinary retention as a manifestation of intermittent claudication . Discussed plans with patient for ongoing care management follow up and provided patient with direct contact information for care management team  Patient Self Care Activities:   . Self administers medications as prescribed . Attends all scheduled provider appointments . Calls pharmacy for medication refills . Performs ADL's independently . Performs IADL's independently . Calls provider office for new concerns or questions  Initial goal documentation     .  "to get my back pain and leg numbness under control" (pt-stated)        CARE PLAN ENTRY (see longitudinal plan of care for additional care plan information)  Current Barriers:  [name] Knowledge Deficits related to treatment and evaluation of right-sided low back pain with right-sided sciatica . Chronic Disease Management support and education needs related to Essential hypertension, Ulcerative Colitis w/o complication, right-sided low back pain with right-sided sciatica   Nurse Case Manager Clinical Goal(s):  [name] Over the next 30 days, patient will verbalize understanding of plan for diagnosis and treatment of right-sided low back pain  . Over the next 90 days, patient will work with the CCM team and PCP to address needs related to disease education and support to improve Self Health management of right-sided back pain with right-sided sciatica  CCM RN CM Interventions:  02/25/20 call completed with patient  . Inter-disciplinary care team collaboration (see longitudinal plan  of care) . Evaluation of current treatment plan related to right-sided back pain and patient's adherence to plan as established by provider. . Reviewed medications with patient and discussed current pharmacological treatment regimen for pain management: o Tramadol 50 mg q 6 hrs as needed for pain o Gabapentin 300 mg (take 2 capsules 600 mg tid) . Determined patient completed a f/u visit with Neurologist NP Butler Denmark on 02/12/20 with the following Assessment/Plan noted:  o ASSESSMENT AND PLAN o 54 y.o. year old female  has a past medical history of Anemia (1998 ), Anxiety, Arthritis, Asthma, BRCA1 negative, BRCA2 negative, Breast cancer  ([place]) (2006), Chronic neck and back pain, Crohn's disease ([place]), Depression, Endometriosis, Fibroid, GERD (gastroesophageal reflux disease), History of chemotherapy (01/2005), tamoxifen therapy, transfusion of packed red blood cells, IBS (irritable bowel syndrome), Kidney stones, Lymphedema of arm, Migraines, Mucinous cystadenoma of ovary, Neuropathy, Paresthesias, Peripheral neuropathy, Radiation (04/2005), Stroke ([place]), Ulcerative colitis ([place]), Urinary tract infection, Vertigo (2014), and Vitamin D deficiency. here with: o 1.  Right-sided low back pain radiating to right leg o -Will reorder MRI of the lumbar spine (was ordered at last visit, but was not completed) o -Increase gabapentin 600 mg 3 times a day o -Already taking meloxicam, tizanidine, amitriptyline, Celebrex, and tramadol o -Discussed physical therapy, wants to wait for results of MRI, should consider this in the future o -Follow-up in 3 months or sooner if needed . Determined patient received MRI results showing a suspicious area on her lumbar spine that will require further evaluation . Determined she is scheduled to have a repeat MRI with contract this afternoon  . Provided active listening and validated patient's feelings of concerns over these results and not knowing what's next for her related to her health  . Collaborated with PCP provider [name], FNP regarding patient's recent MRI results and repeat MRI will be completed for further evaluation  . Discussed plans with patient for ongoing care management follow up and provided patient with direct contact information for care management team . Reviewed scheduled/upcoming provider appointments including: MRI w/contrast is scheduled for 02/25/20 _0 :20 PM   Patient Self Care Activities:  . Self administers medications as prescribed . Attends all scheduled provider appointments . Calls pharmacy for medication refills . Performs ADL's independently . Performs IADL's  independently . Calls provider office for new concerns or questions  Initial goal documentation     .  "to keep my Crohn's disease well controlled" (pt-stated)        CARE PLAN ENTRY (see longitudinal plan of care for additional care plan information)  Current Barriers:  [name] Knowledge Deficits related to disease process and Self Health management of Crohn's disease . Chronic Disease Management support and education needs related to Essential hypertension, Ulcerative Colitis w/o complication, right-sided low back pain with right-sided sciatica   Nurse Case Manager Clinical Goal(s):  [name] Over the next 90 days, patient will work with the CCM team and PCP to address needs related to disease education and support for improved Self Health management of Crohn's disease  CCM RN CM Interventions:  02/25/20 call completed with patient  . Inter-disciplinary care team collaboration (see longitudinal plan of care) . Evaluation of current treatment plan related to Crohn's disease and patient's adherence to plan as established by provider. . Provided education to patient re: extramanifestations may occur as a result of having Crohn's disease, educated on what this means and when to call the doctor if symptoms occur . Reviewed medications with patient  and discussed patient currently receives Infliximab (Inflectra) 100 mg/kg, infuse every 4 weeks; Methotrexate 70m/ml, inject 0.8 ml q Monday  . Pharmacy referral for medication review; may benefit from medication assistance related to Inflectra infusion . Discussed plans with patient for ongoing care management follow up and provided patient with direct contact information for care management team . Reviewed scheduled/upcoming provider appointments including: PCP OV scheduled for 02/26/20 _0 :45 AM  Patient Self Care Activities:  . Self administers medications as prescribed . Attends all scheduled provider appointments . Calls pharmacy for medication  refills . Performs ADL's independently . Performs IADL's independently . Calls provider office for new concerns or questions  Initial goal documentation         Ms. JGuttwas given information about Chronic Care Management services today including:  1. CCM service includes personalized support from designated clinical staff supervised by her physician, including individualized plan of care and coordination with other care providers 2. 24/7 contact phone numbers for assistance for urgent and routine care needs. 3. Service will only be billed when office clinical staff spend 20 minutes or more in a month to coordinate care. 4. Only one practitioner may furnish and bill the service in a calendar month. 5. The patient may stop CCM services at any time (effective at the end of the month) by phone call to the office staff. 6. The patient will be responsible for cost sharing (co-pay) of up to 20% of the service fee (after annual deductible is met).  Patient agreed to services and verbal consent obtained.   Plan:   Telephone follow up appointment with care management team member scheduled for:  03/10/20  ABarb Merino RN, BSN, CCM Care Management Coordinator [facility]  Direct Phone: [phone]

## 2020-02-26 NOTE — Patient Instructions (Signed)
Visit Information  Goals Addressed      Patient Stated   .  "to figure out why I am having urinary retention" (pt-stated)        CARE PLAN ENTRY (see longitudinal plan of care for additional care plan information)  Current Barriers:  [name] Knowledge Deficits related to evaluation and treatment of Impaired Urinary Elimination  . Chronic Disease Management support and education needs related to Essential hypertension, Ulcerative Colitis w/o complication, right-sided low back pain with right-sided sciatica   . Nurse Case Manager Clinical Goal(s):  [name] Over the next 90 days, patient will work with PCP to address needs related to evaluation and treatment of symptoms of Impaired Urinary Elimination including urinary retention, urinary hesitancy, and urinary urgency   CCM RN CM Interventions:  02/25/20 call completed with patient  . Inter-disciplinary care team collaboration (see longitudinal plan of care) . Evaluation of current treatment plan related to Impaired Urinary Elimination and patient's adherence to plan as established by provider . Determined patient had an Urgent care visit on 12/09/19 due to experiencing left flank pain with the following impression/findings noted: o A large amount of fecal retention is seen throughout the colon. Postop cholecystectomy. Nonobstructive bowel gas pattern. No gross free air. Mild degenerative changes of both hips. No abnormal or urinary tract calculi stones or calculi are seen. Incidentally mild levoscoliosis of the lumbar spine. IMPRESSION: No urinary tract calculi or densities are seen. Large fecal retention . Advised patient to discuss her symptoms of Impaired Urinary Elimination with PCP during next visit scheduled for 02/26/20 for further evaluation and recommendations  . Encouraged patient to contact her Orthopedic MD ASAP to schedule a f/u appointment for further evaluation of her lumbar spinal degenerative disc condition to evaluate for spinal stenosis   . Provided education to patient re: potential complications related to spinal stenosis and the possibility of producing symptoms of intermittent episodes of urinary retention as a manifestation of intermittent claudication . Discussed plans with patient for ongoing care management follow up and provided patient with direct contact information for care management team  Patient Self Care Activities:  . Self administers medications as prescribed . Attends all scheduled provider appointments . Calls pharmacy for medication refills . Performs ADL's independently . Performs IADL's independently . Calls provider office for new concerns or questions  Initial goal documentation     .  "to get my back pain and leg numbness under control" (pt-stated)        CARE PLAN ENTRY (see longitudinal plan of care for additional care plan information)  Current Barriers:  [name] Knowledge Deficits related to treatment and evaluation of right-sided low back pain with right-sided sciatica . Chronic Disease Management support and education needs related to Essential hypertension, Ulcerative Colitis w/o complication, right-sided low back pain with right-sided sciatica   Nurse Case Manager Clinical Goal(s):  [name] Over the next 30 days, patient will verbalize understanding of plan for diagnosis and treatment of right-sided low back pain  . Over the next 90 days, patient will work with the CCM team and PCP to address needs related to disease education and support to improve Self Health management of right-sided back pain with right-sided sciatica  CCM RN CM Interventions:  02/25/20 call completed with patient  . Inter-disciplinary care team collaboration (see longitudinal plan of care) . Evaluation of current treatment plan related to right-sided back pain and patient's adherence to plan as established by provider. . Reviewed medications with patient and discussed current pharmacological  treatment regimen for pain  management: o Tramadol 50 mg q 6 hrs as needed for pain o Gabapentin 300 mg (take 2 capsules 600 mg tid) . Determined patient completed a f/u visit with Neurologist NP Butler Denmark on 02/12/20 with the following Assessment/Plan noted:  o ASSESSMENT AND PLAN o 54 y.o. year old female  has a past medical history of Anemia (1998 ), Anxiety, Arthritis, Asthma, BRCA1 negative, BRCA2 negative, Breast cancer ([place]) (2006), Chronic neck and back pain, Crohn's disease ([place]), Depression, Endometriosis, Fibroid, GERD (gastroesophageal reflux disease), History of chemotherapy (01/2005), tamoxifen therapy, transfusion of packed red blood cells, IBS (irritable bowel syndrome), Kidney stones, Lymphedema of arm, Migraines, Mucinous cystadenoma of ovary, Neuropathy, Paresthesias, Peripheral neuropathy, Radiation (04/2005), Stroke ([place]), Ulcerative colitis ([place]), Urinary tract infection, Vertigo (2014), and Vitamin D deficiency. here with: o 1.  Right-sided low back pain radiating to right leg o -Will reorder MRI of the lumbar spine (was ordered at last visit, but was not completed) o -Increase gabapentin 600 mg 3 times a day o -Already taking meloxicam, tizanidine, amitriptyline, Celebrex, and tramadol o -Discussed physical therapy, wants to wait for results of MRI, should consider this in the future o -Follow-up in 3 months or sooner if needed . Determined patient received MRI results showing a suspicious area on her lumbar spine that will require further evaluation . Determined she is scheduled to have a repeat MRI with contract this afternoon  . Provided active listening and validated patient's feelings of concerns over these results and not knowing what's next for her related to her health  . Collaborated with PCP provider [name], FNP regarding patient's recent MRI results and repeat MRI will be completed for further evaluation  . Discussed plans with patient for ongoing care management follow up and provided  patient with direct contact information for care management team . Reviewed scheduled/upcoming provider appointments including: MRI w/contrast is scheduled for 02/25/20 _0 :20 PM   Patient Self Care Activities:  . Self administers medications as prescribed . Attends all scheduled provider appointments . Calls pharmacy for medication refills . Performs ADL's independently . Performs IADL's independently . Calls provider office for new concerns or questions  Initial goal documentation     .  "to keep my Crohn's disease well controlled" (pt-stated)        CARE PLAN ENTRY (see longitudinal plan of care for additional care plan information)  Current Barriers:  [name] Knowledge Deficits related to disease process and Self Health management of Crohn's disease . Chronic Disease Management support and education needs related to Essential hypertension, Ulcerative Colitis w/o complication, right-sided low back pain with right-sided sciatica   Nurse Case Manager Clinical Goal(s):  [name] Over the next 90 days, patient will work with the CCM team and PCP to address needs related to disease education and support for improved Self Health management of Crohn's disease  CCM RN CM Interventions:  02/25/20 call completed with patient  . Inter-disciplinary care team collaboration (see longitudinal plan of care) . Evaluation of current treatment plan related to Crohn's disease and patient's adherence to plan as established by provider. . Provided education to patient re: extramanifestations may occur as a result of having Crohn's disease, educated on what this means and when to call the doctor if symptoms occur . Reviewed medications with patient and discussed patient currently receives Infliximab (Inflectra) 100 mg/kg, infuse every 4 weeks; Methotrexate 82m/ml, inject 0.8 ml q Monday  . Pharmacy referral for medication review; may benefit from medication  assistance related to Inflectra infusion . Discussed plans  with patient for ongoing care management follow up and provided patient with direct contact information for care management team . Reviewed scheduled/upcoming provider appointments including: PCP OV scheduled for 02/26/20 _0 :45 AM  Patient Self Care Activities:  . Self administers medications as prescribed . Attends all scheduled provider appointments . Calls pharmacy for medication refills . Performs ADL's independently . Performs IADL's independently . Calls provider office for new concerns or questions  Initial goal documentation        Patient verbalizes understanding of instructions provided today.   Telephone follow up appointment with care management team member scheduled for: 03/10/20  Barb Merino, RN, BSN, CCM Care Management Coordinator [place] [facility]  Direct Phone: [phone]

## 2020-02-26 NOTE — Progress Notes (Signed)
Subjective:     Patient ID: Gabrielle Hawkins , female    DOB: [birth year] , 54 y.o.   MRN: [id number]   Chief Complaint  Patient presents with  . Hypertension    HPI  She has seen Dr. Krista Blue - MRI of her back with and without contrast.  There is concern of "something suspicious" awaiting results.  She has been stressed. She has increased her gabapentin to 4 times a day, does not have as much numbness to her feet.  She has been having difficulty with sleep due to the pain.  She does have a good support system.   When she goes for her infusions her blood will drop - [facility] every 4 weeks - Remflexa. She was on Rimicaide previously but due to cost had to change to Remflexa - 4-5 months.    She went to urgent care in [place] - due to urinating blood and fever.  She was treated for urinary tract infection. She also had retention.    Hypertension This is a chronic problem. The current episode started more than 1 year ago. The problem is unchanged. The problem is controlled. Pertinent negatives include no anxiety, chest pain, headaches, palpitations or shortness of breath. There are no associated agents to hypertension. Risk factors for coronary artery disease include obesity and sedentary lifestyle. Past treatments include alpha 1 blockers. There are no compliance problems.  There is no history of angina. There is no history of chronic renal disease.  Insomnia The onset quality is sudden. The problem has been gradually worsening since onset.     Past Medical History:  Diagnosis Date  . Anemia 1998   . Anxiety   . Arthritis    big toe   . Asthma   . BRCA1 negative   . BRCA2 negative   . Breast cancer ([place]) 2006   chem/radiation/2years tamoxifen  . Chronic neck and back pain   . Crohn's disease ([place])   . Depression   . Endometriosis   . Fibroid   . GERD (gastroesophageal reflux disease)   . History of chemotherapy 01/2005  . Hx of tamoxifen therapy   . Hx of  transfusion of packed red blood cells   . IBS (irritable bowel syndrome)   . Kidney stones   . Lymphedema of arm   . Migraines    just at dx of breast CA  . Mucinous cystadenoma of ovary    left  . Neuropathy   . Paresthesias   . Peripheral neuropathy   . Radiation 04/2005  . Stroke [facility])    related to when taken compazine  . Ulcerative colitis ([place])    with chronic diarrhea  . Urinary tract infection    x 3  . Vertigo 2014  . Vitamin D deficiency      Family History  Problem Relation Age of Onset  . Breast cancer Mother 43  . Heart failure Father   . Heart disease Father   . Cancer Maternal Grandmother        COLON  . Heart failure Paternal Grandmother   . Heart disease Paternal Grandmother      Current Outpatient Medications:  .  amitriptyline (ELAVIL) 25 MG tablet, Take 25 mg by mouth at bedtime. , Disp: , Rfl: 4 .  beclomethasone (QVAR) 80 MCG/ACT inhaler, Inhale 1 puff into the lungs daily as needed (Shortness of Breath)., Disp: , Rfl:  .  celecoxib (CELEBREX) 200 MG capsule, Take 200 mg by mouth  every evening., Disp: , Rfl:  .  cetirizine (ZYRTEC) 10 MG tablet, Take 10 mg by mouth daily., Disp: , Rfl:  .  dicyclomine (BENTYL) 20 MG tablet, Take 20 mg by mouth at bedtime. , Disp: , Rfl:  .  diphenhydrAMINE (BENADRYL) 25 mg capsule, Take 25 mg by mouth daily as needed for itching., Disp: , Rfl:  .  folic acid (FOLVITE) 1 MG tablet, Take 2 mg by mouth daily., Disp: , Rfl:  .  gabapentin (NEURONTIN) 300 MG capsule, Take 2 capsules (600 mg total) by mouth 3 (three) times daily., Disp: 180 capsule, Rfl: 3 .  inFLIXimab (REMICADE) 100 MG injection, Inject 100 mg into the vein every 30 (thirty) days. Infuse by intravenous route every 4 weeks  Patient is receiving Inflectra brand verses Remicade brand due to insurance, Disp: , Rfl:  .  loperamide (IMODIUM) 2 MG capsule, Take 1 capsule (2 mg total) by mouth 4 (four) times daily as needed for diarrhea or loose stools., Disp: 12  capsule, Rfl: 0 .  meloxicam (MOBIC) 15 MG tablet, Take 15 mg by mouth daily., Disp: , Rfl:  .  methotrexate (50 MG/ML) 1 g injection, Inject into the vein every Monday. 0.31m every Monday, Disp: , Rfl:  .  olmesartan (BENICAR) 20 MG tablet, Take 1 tablet by mouth once daily, Disp: 30 tablet, Rfl: 0 .  ondansetron (ZOFRAN) 4 MG tablet, Take 4 mg by mouth every 6 (six) hours as needed for nausea or vomiting., Disp: , Rfl:  .  temazepam (RESTORIL) 7.5 MG capsule, TAKE 1 CAPSULE BY MOUTH AT BEDTIME AS NEEDED FOR SLEEP, Disp: 30 capsule, Rfl: 0 .  tiZANidine (ZANAFLEX) 2 MG tablet, Take 2 mg by mouth 3 (three) times daily., Disp: , Rfl:  .  traMADol (ULTRAM) 50 MG tablet, Take 50 mg by mouth every 6 (six) hours as needed for moderate pain. , Disp: , Rfl:  .  inFLIXimab in sodium chloride 0.9 %, Inject 10 mg/kg into the vein every 30 (thirty) days. Infuse every 4 weeks, Disp: , Rfl:    Allergies  Allergen Reactions  . Humira [Adalimumab] Anaphylaxis  . Ampicillin Hives and Itching  . Codeine   . Compazine [Prochlorperazine Maleate] Other (See Comments)    "Stroke-like symptoms" Can tolerate Phenergan.  .Marland KitchenCymbalta [Duloxetine Hcl] Other (See Comments)    Fainting  . Imitrex [Sumatriptan Base] Hives and Nausea And Vomiting  . Lyrica [Pregabalin] Other (See Comments)    Fainting  . Metoclopramide Hcl Hives  . Percocet [Oxycodone-Acetaminophen] Itching  . Effexor [Venlafaxine Hydrochloride] Hives and Palpitations  . Tape Itching and Rash     Review of Systems  Constitutional: Negative.   Respiratory: Negative.  Negative for cough and shortness of breath.   Cardiovascular: Negative for chest pain, palpitations and leg swelling.  Endocrine: Negative for polydipsia, polyphagia and polyuria.  Musculoskeletal: Positive for back pain.       Right leg pain.  Neurological: Negative for dizziness and headaches.  Psychiatric/Behavioral: Negative for agitation and confusion. The patient has  insomnia.      Today's Vitals   02/26/20 1114  BP: (!) 132/94  Pulse: 96  Temp: 98.1 F (36.7 C)  TempSrc: Oral  Weight: 182 lb (82.6 kg)  Height: 5' 6.2" (1.681 m)  PainSc: 8   PainLoc: Back   Body mass index is 29.2 kg/m.   Objective:  Physical Exam Vitals reviewed.  Constitutional:      General: She is not in acute distress.  Appearance: Normal appearance.  Eyes:     Pupils: Pupils are equal, round, and reactive to light.  Cardiovascular:     Rate and Rhythm: Normal rate and regular rhythm.     Pulses: Normal pulses.     Heart sounds: Normal heart sounds. No murmur heard.   Pulmonary:     Effort: Pulmonary effort is normal. No respiratory distress.     Breath sounds: Normal breath sounds.  Skin:    General: Skin is warm and dry.     Capillary Refill: Capillary refill takes less than 2 seconds.  Neurological:     General: No focal deficit present.     Mental Status: She is alert.     Cranial Nerves: No cranial nerve deficit.  Psychiatric:        Mood and Affect: Mood normal.        Behavior: Behavior normal.        Thought Content: Thought content normal.        Judgment: Judgment normal.         Assessment And Plan:     1. Essential hypertension . B/P is controlled.  . CMP ordered to check renal function.  . The importance of regular exercise and dietary modification was stressed to the patient.  . Stressed importance of losing ten percent of her body weight to help with B/P control.  . The weight loss would help with decreasing cardiac and cancer risk as well.  - olmesartan (BENICAR) 20 MG tablet; Take 1 tablet (20 mg total) by mouth daily.  Dispense: 30 tablet; Refill: 0 - CBC with Differential/Platelet  2. Insomnia, unspecified type  Temazepam had been effective she had run out  Refill sent  Will consider changing to mirtazapine to help with her appetite if not effective when she restarts - temazepam (RESTORIL) 7.5 MG capsule; Take 1 capsule  (7.5 mg total) by mouth at bedtime as needed for sleep.  Dispense: 30 capsule; Refill: 0  3. Other ulcerative colitis without complication (HCC)  Chronic, continue follow up with GI  4. Decreased appetite  She is being followed by GI  This may be related to her concern of a recent MRI of her back   Will continue to monitor this as she is seeing Dr. Krista Blue  5. Chronic right-sided low back pain with right-sided sciatica  Continue follow up with Dr. Brynda Rim, FNP    THE PATIENT IS ENCOURAGED TO PRACTICE SOCIAL DISTANCING DUE TO THE COVID-19 PANDEMIC.

## 2020-02-27 ENCOUNTER — Telehealth: Payer: Self-pay | Admitting: Neurology

## 2020-02-27 DIAGNOSIS — M5441 Lumbago with sciatica, right side: Secondary | ICD-10-CM

## 2020-02-27 DIAGNOSIS — Z79899 Other long term (current) drug therapy: Secondary | ICD-10-CM | POA: Diagnosis not present

## 2020-02-27 DIAGNOSIS — K5100 Ulcerative (chronic) pancolitis without complications: Secondary | ICD-10-CM | POA: Diagnosis not present

## 2020-02-27 NOTE — Telephone Encounter (Signed)
I called the patient. 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  She continues to report low back pain, radiating down the right leg.  Reviewed MRI with Dr. Krista Blue, will order x-ray of the right hip.  She had a essentially normal nerve conduction study of the right lower extremity in July 2020, mildly decreased snap amplitude, prolonged peak latency of bilateral superficial peroneal sensory responses, could be due to technical difficulty or cold limb temperature. If MRI is negative, decide whether nerve conduction study should be repeated?  She is on Remicade treatment for Crohn's disease, has history of breast cancer.  MRI lumbar spine (with and without) demonstrating: - Stable hemangiomas noted at T12, L5 and S1 levels; no abnormal enhancement.  - Mild degenerative changes from L1-2 to L4-5; no spinal stenosis or foraminal narrowing.

## 2020-02-28 ENCOUNTER — Telehealth: Payer: Self-pay | Admitting: Nurse Practitioner

## 2020-02-28 NOTE — Chronic Care Management (AMB) (Signed)
  Chronic Care Management   Note  02/28/2020 Name: Gabrielle Hawkins MRN: [id number] DOB: [birth year]  Gabrielle Hawkins is a 54 y.o. year old female who is a primary care patient of [name], [place]. Gabrielle Hawkins is currently enrolled in care management services. An additional referral for Pharm D was placed.   Follow up plan: Telephone appointment with care management team member scheduled for:04/02/2020  Glenna Durand, LPN Health Advisor, [facility] ??[email or web address] ??[phone]

## 2020-03-02 ENCOUNTER — Telehealth: Payer: Self-pay | Admitting: Neurology

## 2020-03-02 ENCOUNTER — Other Ambulatory Visit: Payer: Self-pay

## 2020-03-02 ENCOUNTER — Ambulatory Visit
Admission: RE | Admit: 2020-03-02 | Discharge: 2020-03-02 | Disposition: A | Discharge status: Home / Self Care | Payer: Medicare HMO | Source: Ambulatory Visit | Attending: Neurology | Admitting: Neurology

## 2020-03-02 DIAGNOSIS — M5441 Lumbago with sciatica, right side: Secondary | ICD-10-CM

## 2020-03-02 DIAGNOSIS — M1611 Unilateral primary osteoarthritis, right hip: Secondary | ICD-10-CM | POA: Diagnosis not present

## 2020-03-02 NOTE — Telephone Encounter (Signed)
Xray of the right hip, showed mild narrowing of the right hip joint, is stable.  MRI lumbar spine with and without contrast showed stable hemangiomas at T12, L5, and S1, mild degenerative changes at L1-2 to L4-5, no spinal stenosis or foraminal narrowing.  She continues to report low back pain, pain radiating down the right leg.  She had nerve conduction evaluation in July 2020 of right lower extremity muscles, right lumbosacral paraspinal muscles  Conclusion: This is essentially a normal study.  There is no electrodiagnostic evidence of large fiber peripheral neuropathy, or right lumbosacral radiculopathy.  The mildly decreased snap amplitude, prolonged peak latency at bilateral superficial peroneal sensory responses could be due to to technical difficulty and cold limb temperature.  Due to her continued pain, would you recommend repeat nerve conduction? She is on Remicade for Crohn's disease, history of breast cancer.

## 2020-03-02 NOTE — Telephone Encounter (Signed)
Please call the patient, reviewed scans with Dr.Yan, at this point would suggest referral to pain management. Hemangiomas not felt to be cause of pain.

## 2020-03-02 NOTE — Telephone Encounter (Signed)
I called pt relayed the information per SS/NP and YY/MD.  She mentioned that back in 2013-2014 Dr. Patrice Paradise and Sr. Silo saw her at the spine and sc[facility]osis clinic on [address] in [place].  Would they be good to see?

## 2020-03-02 NOTE — Telephone Encounter (Signed)
I would not repeat EMG nerve conduction study, it will not add on extra information to her management, she is on gabapentin 600 mg 3 times a day, the other options are Cymbalta, Lyrica

## 2020-03-03 NOTE — Telephone Encounter (Addendum)
If she has established with that practice, she can go back for opinion, she may reach out to them. Based on imaging, nothing was felt to be surgical at this point, I am not clear if they may have other options.

## 2020-03-03 NOTE — Telephone Encounter (Signed)
I called pt and relayed the message per SS/NP ok to get second opinion from them whom she has seen in the past.   She will reach out to them and if needs [place] to send information to them will let us know, otherwise will send to pain management. She verbalized understanding and will let us know.

## 2020-03-05 ENCOUNTER — Telehealth: Payer: Self-pay

## 2020-03-05 ENCOUNTER — Ambulatory Visit: Payer: Self-pay

## 2020-03-05 DIAGNOSIS — I10 Essential (primary) hypertension: Secondary | ICD-10-CM

## 2020-03-05 DIAGNOSIS — K5180 Other ulcerative colitis without complications: Secondary | ICD-10-CM

## 2020-03-05 NOTE — Chronic Care Management (AMB) (Signed)
°  Chronic Care Management   Outreach Note  03/05/2020 Name: Gabrielle Hawkins MRN: [id number] DOB: 1965-10-21  Referred by: [name], FNP Reason for referral : Care Coordination   SW placed an unsuccessful outbound call to the patient to conduct an SDOH (social determinants of health) screen. SW left a HIPAA compliant voice message requesting a return call.  Follow Up Plan: The care management team will reach out to the patient again over the next 14 days.   [name], BSW, CDP Social Worker, Certified Dementia Practitioner [place] [facility] [phone]

## 2020-03-10 ENCOUNTER — Other Ambulatory Visit: Payer: Self-pay

## 2020-03-10 ENCOUNTER — Telehealth: Payer: Medicare HMO

## 2020-03-10 ENCOUNTER — Ambulatory Visit: Payer: Self-pay

## 2020-03-10 DIAGNOSIS — I10 Essential (primary) hypertension: Secondary | ICD-10-CM

## 2020-03-10 DIAGNOSIS — G8929 Other chronic pain: Secondary | ICD-10-CM

## 2020-03-10 DIAGNOSIS — K5180 Other ulcerative colitis without complications: Secondary | ICD-10-CM

## 2020-03-11 NOTE — Chronic Care Management (AMB) (Signed)
Chronic Care Management   Follow Up Note   03/11/2020 Name: Gabrielle Hawkins MRN: [id number] DOB: 03-Sep-1966  Referred by: [name], FNP Reason for referral : Chronic Care Management (FU RN CM Call - MRI/Urinary/BP)   Gabrielle Hawkins is a 54 y.o. year old female who is a primary care patient of [name], [place]. The CCM team was consulted for assistance with chronic disease management and care coordination needs.    Review of patient status, including review of consultants reports, relevant laboratory and other test results, and collaboration with appropriate care team members and the patient's provider was performed as part of comprehensive patient evaluation and provision of chronic care management services.    SDOH (Social Determinants of Health) assessments performed: No See Care Plan activities for detailed interventions related to [facility])   Reviewed chart in preparation to contact patient to assess for CCM needs.     Outpatient Encounter Medications as of 03/10/2020  Medication Sig  . amitriptyline (ELAVIL) 25 MG tablet Take 25 mg by mouth at bedtime.   . beclomethasone (QVAR) 80 MCG/ACT inhaler Inhale 1 puff into the lungs daily as needed (Shortness of Breath).  . celecoxib (CELEBREX) 200 MG capsule Take 200 mg by mouth every evening.  . cetirizine (ZYRTEC) 10 MG tablet Take 10 mg by mouth daily.  [name] dicyclomine (BENTYL) 20 MG tablet Take 20 mg by mouth at bedtime.   . diphenhydrAMINE (BENADRYL) 25 mg capsule Take 25 mg by mouth daily as needed for itching.  . folic acid (FOLVITE) 1 MG tablet Take 2 mg by mouth daily.  [name] gabapentin (NEURONTIN) 300 MG capsule Take 2 capsules (600 mg total) by mouth 3 (three) times daily.  [name] inFLIXimab (REMICADE) 100 MG injection Inject 100 mg into the vein every 30 (thirty) days. Infuse by intravenous route every 4 weeks  Patient is receiving Inflectra brand verses Remicade brand due to insurance  . inFLIXimab in sodium chloride 0.9 %  Inject 10 mg/kg into the vein every 30 (thirty) days. Infuse every 4 weeks  . loperamide (IMODIUM) 2 MG capsule Take 1 capsule (2 mg total) by mouth 4 (four) times daily as needed for diarrhea or loose stools.  . meloxicam (MOBIC) 15 MG tablet Take 15 mg by mouth daily.  . methotrexate (50 MG/ML) 1 g injection Inject into the vein every Monday. 0.78m every Monday  . olmesartan (BENICAR) 20 MG tablet Take 1 tablet (20 mg total) by mouth daily.  . ondansetron (ZOFRAN) 4 MG tablet Take 4 mg by mouth every 6 (six) hours as needed for nausea or vomiting.  . temazepam (RESTORIL) 7.5 MG capsule TAKE 1 CAPSULE BY MOUTH AT BEDTIME AS NEEDED FOR SLEEP  . tiZANidine (ZANAFLEX) 2 MG tablet Take 2 mg by mouth 3 (three) times daily.  . traMADol (ULTRAM) 50 MG tablet Take 50 mg by mouth every 6 (six) hours as needed for moderate pain.    No facility-administered encounter medications on file as of 03/10/2020.     Objective:  No results found for: HGBA1C Lab Results  Component Value Date   MICROALBUR 10 06/04/2019   LDLCALC 124 (H) 12/24/2009   CREATININE 0.89 02/18/2020   BP Readings from Last 3 Encounters:  02/26/20 (!) 132/94  02/12/20 138/87  06/04/19 120/60   Plan:   Telephone follow up appointment with care management team member scheduled for: 03/12/20  ABarb Merino RN, BSN, CCM Care Management Coordinator [facility]  Direct Phone: [phone]

## 2020-03-12 ENCOUNTER — Telehealth: Payer: Medicare HMO

## 2020-03-12 ENCOUNTER — Other Ambulatory Visit: Payer: Self-pay

## 2020-03-12 ENCOUNTER — Ambulatory Visit (INDEPENDENT_AMBULATORY_CARE_PROVIDER_SITE_OTHER): Payer: Medicare HMO

## 2020-03-12 ENCOUNTER — Encounter: Payer: Self-pay | Admitting: Nurse Practitioner

## 2020-03-12 DIAGNOSIS — I10 Essential (primary) hypertension: Secondary | ICD-10-CM

## 2020-03-12 DIAGNOSIS — K5180 Other ulcerative colitis without complications: Secondary | ICD-10-CM

## 2020-03-12 DIAGNOSIS — G8929 Other chronic pain: Secondary | ICD-10-CM

## 2020-03-12 DIAGNOSIS — M5441 Lumbago with sciatica, right side: Secondary | ICD-10-CM

## 2020-03-16 NOTE — Patient Instructions (Signed)
Visit Information  Goals Addressed      Patient Stated     "to get my back pain and leg numbness under control" (pt-stated)        CARE PLAN ENTRY (see longitudinal plan of care for additional care plan information)  Current Barriers:   Knowledge Deficits related to treatment and evaluation of right-sided low back pain with right-sided sciatica  Chronic Disease Management support and education needs related to Essential hypertension, Ulcerative Colitis w/o complication, right-sided low back pain with right-sided sciatica   Nurse Case Manager Clinical Goal(s):   Over the next 30 days, patient will verbalize understanding of plan for diagnosis and treatment of right-sided low back pain   Over the next 90 days, patient will work with the CCM team and PCP to address needs related to disease education and support to improve Self Health management of right-sided back pain with right-sided sciatica  CCM RN CM Interventions:  03/12/20 call completed with patient   Inter-disciplinary care team collaboration (see longitudinal plan of care)  Evaluation of current treatment plan related to right-sided back pain and patient's adherence to plan as established by provider.  Reviewed medications with patient and discussed current pharmacological treatment regimen for pain management: o Tramadol 50 mg q 6 hrs as needed for pain o Gabapentin 300 mg (take 2 capsules 600 mg tid)  Determined patient received an update concerning her MRI results per Butler Denmark NP with the following  o ASSESSMENT AND PLAN "I called the patient. 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  She continues to report low back pain, radiating down the right leg.  Reviewed MRI with Dr. Krista Blue, will order x-ray of the right hip.  She had a essentially normal nerve conduction study of the right lower extremity in July 2020, mildly decreased snap amplitude, prolonged peak latency of bilateral superficial  peroneal sensory responses, could be due to technical difficulty or cold limb temperature. If MRI is negative, decide whether nerve conduction study should be repeated?  She is on Remicade treatment for Crohn's disease, has history of breast cancer." o - MRI lumbar spine (with and without) demonstrating: o - Stable hemangiomas noted at T12, L5 and S1 levels; no abnormal enhancement.  o - Mild degenerative changes from L1-2 to L4-5; no spinal stenosis or foraminal narrowing.   Discussed plans with patient for ongoing care management follow up and provided patient with direct contact information for care management team  Reviewed scheduled/upcoming provider appointments including:  Spine & Scoliosis, Dr. Maia Petties MD to determine his recommendations for treatment of her pain and associated symptoms related to the hemangioma  Patient Self Care Activities:   Self administers medications as prescribed  Attends all scheduled provider appointments  Calls pharmacy for medication refills  Performs ADL's independently  Performs IADL's independently  Calls provider office for new concerns or questions  Please see past updates related to this goal by clicking on the "Past Updates" button in the selected goal        Patient verbalizes understanding of instructions provided today.   Telephone follow up appointment with care management team member scheduled for: 04/27/20  Barb Merino, RN, BSN, CCM Care Management Coordinator [place] [facility]  Direct Phone: [phone]

## 2020-03-16 NOTE — Chronic Care Management (AMB) (Signed)
Chronic Care Management   Follow Up Note   03/12/2020 Name: Gabrielle Hawkins MRN: [id number] DOB: [birth year]  Referred by: [name], FNP Reason for referral : Chronic Care Management (FU RN CM Call )   Gabrielle Hawkins is a 54 y.o. year old female who is a primary care patient of [name], [place]. The CCM team was consulted for assistance with chronic disease management and care coordination needs.    Review of patient status, including review of consultants reports, relevant laboratory and other test results, and collaboration with appropriate care team members and the patient's provider was performed as part of comprehensive patient evaluation and provision of chronic care management services.    SDOH (Social Determinants of Health) assessments performed: Yes - no new acute challenges See Care Plan activities for detailed interventions related to [name])   Placed outbound follow up call to patient for CCM RN CM care plan follow up.     Outpatient Encounter Medications as of 03/12/2020  Medication Sig  . amitriptyline (ELAVIL) 25 MG tablet Take 25 mg by mouth at bedtime.   . beclomethasone (QVAR) 80 MCG/ACT inhaler Inhale 1 puff into the lungs daily as needed (Shortness of Breath).  . celecoxib (CELEBREX) 200 MG capsule Take 200 mg by mouth every evening.  . cetirizine (ZYRTEC) 10 MG tablet Take 10 mg by mouth daily.  [name] dicyclomine (BENTYL) 20 MG tablet Take 20 mg by mouth at bedtime.   . diphenhydrAMINE (BENADRYL) 25 mg capsule Take 25 mg by mouth daily as needed for itching.  . folic acid (FOLVITE) 1 MG tablet Take 2 mg by mouth daily.  [name] gabapentin (NEURONTIN) 300 MG capsule Take 2 capsules (600 mg total) by mouth 3 (three) times daily.  [name] inFLIXimab (REMICADE) 100 MG injection Inject 100 mg into the vein every 30 (thirty) days. Infuse by intravenous route every 4 weeks  Patient is receiving Inflectra brand verses Remicade brand due to insurance  . inFLIXimab in sodium  chloride 0.9 % Inject 10 mg/kg into the vein every 30 (thirty) days. Infuse every 4 weeks  . loperamide (IMODIUM) 2 MG capsule Take 1 capsule (2 mg total) by mouth 4 (four) times daily as needed for diarrhea or loose stools.  . meloxicam (MOBIC) 15 MG tablet Take 15 mg by mouth daily.  . methotrexate (50 MG/ML) 1 g injection Inject into the vein every Monday. 0.4m every Monday  . olmesartan (BENICAR) 20 MG tablet Take 1 tablet (20 mg total) by mouth daily.  . ondansetron (ZOFRAN) 4 MG tablet Take 4 mg by mouth every 6 (six) hours as needed for nausea or vomiting.  . temazepam (RESTORIL) 7.5 MG capsule TAKE 1 CAPSULE BY MOUTH AT BEDTIME AS NEEDED FOR SLEEP  . tiZANidine (ZANAFLEX) 2 MG tablet Take 2 mg by mouth 3 (three) times daily.  . traMADol (ULTRAM) 50 MG tablet Take 50 mg by mouth every 6 (six) hours as needed for moderate pain.    No facility-administered encounter medications on file as of 03/12/2020.     Objective:  No results found for: HGBA1C Lab Results  Component Value Date   MICROALBUR 10 06/04/2019   LDLCALC 124 (H) 12/24/2009   CREATININE 0.89 02/18/2020   No results found for: HGBA1C Lab Results  Component Value Date   MICROALBUR 10 06/04/2019   LDLCALC 124 (H) 12/24/2009   CREATININE 0.89 02/18/2020   Goals Addressed      Patient Stated   .  "to get my  back pain and leg numbness under control" (pt-stated)        CARE PLAN ENTRY (see longitudinal plan of care for additional care plan information)  Current Barriers:  [name] Knowledge Deficits related to treatment and evaluation of right-sided low back pain with right-sided sciatica . Chronic Disease Management support and education needs related to Essential hypertension, Ulcerative Colitis w/o complication, right-sided low back pain with right-sided sciatica   Nurse Case Manager Clinical Goal(s):  [name] Over the next 30 days, patient will verbalize understanding of plan for diagnosis and treatment of right-sided low back  pain  . Over the next 90 days, patient will work with the CCM team and PCP to address needs related to disease education and support to improve Self Health management of right-sided back pain with right-sided sciatica  CCM RN CM Interventions:  03/12/20 call completed with patient  . Inter-disciplinary care team collaboration (see longitudinal plan of care) . Evaluation of current treatment plan related to right-sided back pain and patient's adherence to plan as established by provider. . Reviewed medications with patient and discussed current pharmacological treatment regimen for pain management: o Tramadol 50 mg q 6 hrs as needed for pain o Gabapentin 300 mg (take 2 capsules 600 mg tid) . Determined patient received an update concerning her MRI results per Butler Denmark NP with the following  o ASSESSMENT AND PLAN "I called the patient. 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  She continues to report low back pain, radiating down the right leg.  Reviewed MRI with Dr. Krista Blue, will order x-ray of the right hip.  She had a essentially normal nerve conduction study of the right lower extremity in July 2020, mildly decreased snap amplitude, prolonged peak latency of bilateral superficial peroneal sensory responses, could be due to technical difficulty or cold limb temperature. If MRI is negative, decide whether nerve conduction study should be repeated?  She is on Remicade treatment for Crohn's disease, has history of breast cancer." o - MRI lumbar spine (with and without) demonstrating: o - Stable hemangiomas noted at T12, L5 and S1 levels; no abnormal enhancement.  o - Mild degenerative changes from L1-2 to L4-5; no spinal stenosis or foraminal narrowing.  . Discussed plans with patient for ongoing care management follow up and provided patient with direct contact information for care management team . Reviewed scheduled/upcoming provider appointments including:  [facility], Dr. Maia Petties MD to determine his recommendations for treatment of her pain and associated symptoms related to the hemangioma  Patient Self Care Activities:  . Self administers medications as prescribed . Attends all scheduled provider appointments . Calls pharmacy for medication refills . Performs ADL's independently . Performs IADL's independently . Calls provider office for new concerns or questions  Please see past updates related to this goal by clicking on the "Past Updates" button in the selected goal         Plan:   Telephone follow up appointment with care management team member scheduled for: 04/27/20   Barb Merino, RN, BSN, CCM Care Management Coordinator [facility]  Direct Phone: [phone]

## 2020-03-18 ENCOUNTER — Ambulatory Visit: Payer: Medicare HMO

## 2020-03-18 DIAGNOSIS — I10 Essential (primary) hypertension: Secondary | ICD-10-CM | POA: Diagnosis not present

## 2020-03-18 DIAGNOSIS — K5180 Other ulcerative colitis without complications: Secondary | ICD-10-CM

## 2020-03-18 NOTE — Patient Instructions (Signed)
Social Worker Visit Information  Goals we discussed today:  Goals Addressed              This Visit's Progress     Patient Stated   .  "I would like to complete an advance directive" (pt-stated)        CARE PLAN ENTRY (see longtitudinal plan of care for additional care plan information)  Current Barriers:  . Limited education about how to name a healthcare power of attorney . Chronic Conditions including HTN and Ulcerative Colitis which put the patient at increased risk for hospitalization  Social Work Clinical Goal(s):  [name] Over the next 20 days, the patient will review mailed Advance Directive packet . Over the next 30 days, patient will verbalize basic understanding of Advanced Directives and importance of completion  CCM Interventions: Completed 03/18/20 . Interviewed patient about Financial controller and provided education about the importance of completing advanced directives . Mailed the patient an Emergency planning/management officer . Advised patient to review information mailed by this SW . Scheduled follow up call to the patient over the next month to review mailed resource  Patient Self Care Activities:  . Can read and write at 6th grading reading level . Is able to readily make contact with social support system . Can identify next of kin, power or attorney, guardian, or primary caregiver . Attends all scheduled provider appointments . Performs ADLs independently . Performs IADLs independently  Initial goal documentation          Materials provided: Yes: mailed advance directive packet   Follow up plan: SW will follow up with patient by phone over the next month.   [name], BSW, CDP Social Worker, Certified Dementia Practitioner [place] [facility] [place] [facility] [phone]

## 2020-03-18 NOTE — Chronic Care Management (AMB) (Signed)
Chronic Care Management    Social Work Follow Up Note  03/18/2020 Name: Gabrielle Hawkins MRN: [id number] DOB: 11-26-1965  Gabrielle Hawkins is a 54 y.o. year old female who is a primary care patient of [name], [place]. The CCM team was consulted for assistance with care coordination.   Review of patient status, including review of consultants reports, other relevant assessments, and collaboration with appropriate care team members and the patient's provider was performed as part of comprehensive patient evaluation and provision of chronic care management services.    SDOH (Social Determinants of Health) assessments performed: Yes, no acute challenges identified SDOH Interventions     Most Recent Value  SDOH Interventions  Food Insecurity Interventions Intervention Not Indicated  Housing Interventions Intervention Not Indicated  Transportation Interventions Intervention Not Indicated       Outpatient Encounter Medications as of 03/18/2020  Medication Sig  . amitriptyline (ELAVIL) 25 MG tablet Take 25 mg by mouth at bedtime.   . beclomethasone (QVAR) 80 MCG/ACT inhaler Inhale 1 puff into the lungs daily as needed (Shortness of Breath).  . celecoxib (CELEBREX) 200 MG capsule Take 200 mg by mouth every evening.  . cetirizine (ZYRTEC) 10 MG tablet Take 10 mg by mouth daily.  [name] dicyclomine (BENTYL) 20 MG tablet Take 20 mg by mouth at bedtime.   . diphenhydrAMINE (BENADRYL) 25 mg capsule Take 25 mg by mouth daily as needed for itching.  . folic acid (FOLVITE) 1 MG tablet Take 2 mg by mouth daily.  [name] gabapentin (NEURONTIN) 300 MG capsule Take 2 capsules (600 mg total) by mouth 3 (three) times daily.  [name] inFLIXimab (REMICADE) 100 MG injection Inject 100 mg into the vein every 30 (thirty) days. Infuse by intravenous route every 4 weeks  Patient is receiving Inflectra brand verses Remicade brand due to insurance  . inFLIXimab in sodium chloride 0.9 % Inject 10 mg/kg into the vein every 30  (thirty) days. Infuse every 4 weeks  . loperamide (IMODIUM) 2 MG capsule Take 1 capsule (2 mg total) by mouth 4 (four) times daily as needed for diarrhea or loose stools.  . meloxicam (MOBIC) 15 MG tablet Take 15 mg by mouth daily.  . methotrexate (50 MG/ML) 1 g injection Inject into the vein every Monday. 0.59m every Monday  . olmesartan (BENICAR) 20 MG tablet Take 1 tablet (20 mg total) by mouth daily.  . ondansetron (ZOFRAN) 4 MG tablet Take 4 mg by mouth every 6 (six) hours as needed for nausea or vomiting.  . temazepam (RESTORIL) 7.5 MG capsule TAKE 1 CAPSULE BY MOUTH AT BEDTIME AS NEEDED FOR SLEEP  . tiZANidine (ZANAFLEX) 2 MG tablet Take 2 mg by mouth 3 (three) times daily.  . traMADol (ULTRAM) 50 MG tablet Take 50 mg by mouth every 6 (six) hours as needed for moderate pain.    No facility-administered encounter medications on file as of 03/18/2020.     Goals Addressed              This Visit's Progress     Patient Stated   .  "I would like to complete an advance directive" (pt-stated)        CARE PLAN ENTRY (see longtitudinal plan of care for additional care plan information)  Current Barriers:  . Limited education about how to name a healthcare power of attorney . Chronic Conditions including HTN and Ulcerative Colitis which put the patient at increased risk for hospitalization  Social Work Clinical Goal(s):  .  Over the next 20 days, the patient will review mailed Advance Directive packet . Over the next 30 days, patient will verbalize basic understanding of Advanced Directives and importance of completion  CCM Interventions: Completed 03/18/20 . Interviewed patient about Financial controller and provided education about the importance of completing advanced directives . Mailed the patient an Emergency planning/management officer . Advised patient to review information mailed by this SW . Scheduled follow up call to the patient over the next month to review mailed resource  Patient  Self Care Activities:  . Can read and write at 6th grading reading level . Is able to readily make contact with social support system . Can identify next of kin, power or attorney, guardian, or primary caregiver . Attends all scheduled provider appointments . Performs ADLs independently . Performs IADLs independently  Initial goal documentation          Follow Up Plan: SW will follow up with patient by phone over the next month.   [name], BSW, CDP Social Worker, Certified Dementia Practitioner [place] [facility] [place] [facility] [phone]  Total time spent performing care coordination and/or care management activities with the patient by phone or face to face = 25 minutes.

## 2020-03-30 ENCOUNTER — Encounter: Payer: Self-pay | Admitting: Nurse Practitioner

## 2020-03-30 ENCOUNTER — Other Ambulatory Visit: Payer: Self-pay | Admitting: Nurse Practitioner

## 2020-03-30 DIAGNOSIS — I10 Essential (primary) hypertension: Secondary | ICD-10-CM

## 2020-03-30 DIAGNOSIS — K580 Irritable bowel syndrome with diarrhea: Secondary | ICD-10-CM | POA: Diagnosis not present

## 2020-03-30 DIAGNOSIS — K5190 Ulcerative colitis, unspecified, without complications: Secondary | ICD-10-CM | POA: Diagnosis not present

## 2020-04-01 DIAGNOSIS — K5190 Ulcerative colitis, unspecified, without complications: Secondary | ICD-10-CM | POA: Diagnosis not present

## 2020-04-02 ENCOUNTER — Ambulatory Visit: Payer: Medicare HMO

## 2020-04-02 ENCOUNTER — Other Ambulatory Visit: Payer: Self-pay

## 2020-04-02 DIAGNOSIS — I10 Essential (primary) hypertension: Secondary | ICD-10-CM

## 2020-04-02 DIAGNOSIS — K5180 Other ulcerative colitis without complications: Secondary | ICD-10-CM

## 2020-04-02 DIAGNOSIS — G4700 Insomnia, unspecified: Secondary | ICD-10-CM

## 2020-04-02 NOTE — Chronic Care Management (AMB) (Signed)
Chronic Care Management Pharmacy  Name: Gabrielle Hawkins  MRN: [id number] DOB: [birth year]  Chief Complaint/ HPI  Gabrielle Hawkins,  54 y.o. , female presents for their Initial CCM visit with the clinical pharmacist via telephone due to COVID-19 Pandemic.  PCP : [name], FNP  Their chronic conditions include: Hypertension and Ulcerative colitis and Insomnia  Office Visits: 03/30/20 Telephone call: Pt requesting refill for BP medication. Also complained of chronic fatigue and what tests are required to be testes for chronic fatigue syndrome.   02/26/20 OV: BP is controlled. Labs ordered (CBC w/ diff). Temazepam has been effective to help with insomnia, refill sent. Consider changing to mirtazapine to help with appetite if temazepam not effective.   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 Order Xray of right hip.   02/12/20 Neurology OV w/ S. Slack: Continued complaints of right-sided low back pain radiating to right leg. Nerve conduction study in 03/2019 of right lower extremity was essentially normal. MRI of lumbar spine ordered. Increase gabapentin to 661m three times daily. Also taking meloxicam, tizanidine, amitriptyline, Celebrex, and tramadol. Consider physical therapy. Follow up in 3 months.  02/04/20 Urgent Care OV: Back pain. Xray of lumbar spine showed moderate multilevel degenerative disk disease. No fracture or lesions. Lower lumbar facet arthropathy is noted. Lumbar scoliosis present.   01/30/20: Renflexis infusion for ulcerative colitis  01/02/20: Renflexis infusion for ulcerative colitis  12/09/19 Urgent Care OV: Urinalysis/urine culture abnormal. Urinary tract infection. Started Keflex 5059mfour times daily for 7 days.Abdominal Xray for left flank pain showed large amount of fecal retention throughout colon. No urinary  tract calculi or stones.   11/06/19: Renflexis infusion for ulcerative colitis  10/09/19: Renflexis infusion for ulcerative colitis  CCM Encounters: 03/18/20 SW: Mailed pt advanced directive packet.   02/25/20 RN: Established care plan. Referred to PharmD for medication assistance.  Medications: Outpatient Encounter Medications as of 04/02/2020  Medication Sig  . amitriptyline (ELAVIL) 25 MG tablet Take 25 mg by mouth at bedtime.   . celecoxib (CELEBREX) 200 MG capsule Take 200 mg by mouth 2 (two) times daily.   . cetirizine (ZYRTEC) 10 MG tablet Take 10 mg by mouth daily.  . Marland Kitchenicyclomine (BENTYL) 20 MG tablet Take 20 mg by mouth 4 (four) times daily -  before meals and at bedtime. Up to four times daily as needed  . folic acid (FOLVITE) 1 MG tablet Take 3 mg by mouth daily.   . Marland Kitchenabapentin (NEURONTIN) 300 MG capsule Take 2 capsules (600 mg total) by mouth 3 (three) times daily. (Patient taking differently: Take 1,200 mg by mouth at bedtime. )  . inFLIXimab in sodium chloride 0.9 % Inject 10 mg/kg into the vein every 30 (thirty) days. Infuse every 4 weeks  . loperamide (IMODIUM) 2 MG capsule Take 1 capsule (2 mg total) by mouth 4 (four) times daily as needed for diarrhea or loose stools.  . methotrexate (50 MG/ML) 1 g injection Inject into the vein every Monday. 0.10m53mvery Monday  . olmesartan (BENICAR) 20 MG tablet Take 1 tablet by mouth once daily  . ondansetron (ZOFRAN) 4 MG tablet Take 4 mg by mouth every 6 (six) hours as needed for nausea or vomiting.  . temazepam (RESTORIL) 7.5 MG capsule TAKE 1 CAPSULE BY MOUTH AT BEDTIME AS NEEDED FOR SLEEP  . tiZANidine (ZANAFLEX) 2 MG tablet Take 2  mg by mouth 3 (three) times daily.  . traMADol (ULTRAM) 50 MG tablet Take 50 mg by mouth every 6 (six) hours as needed for moderate pain.   . beclomethasone (QVAR) 80 MCG/ACT inhaler Inhale 1 puff into the lungs daily as needed (Shortness of Breath). (Patient not taking: Reported on 04/10/2020)  .  diphenhydrAMINE (BENADRYL) 25 mg capsule Take 25 mg by mouth daily as needed for itching. (Patient not taking: Reported on 04/10/2020)  . inFLIXimab (REMICADE) 100 MG injection Inject 100 mg into the vein every 30 (thirty) days. Infuse by intravenous route every 4 weeks  Patient is receiving Inflectra brand verses Remicade brand due to insurance (Patient not taking: Reported on 04/10/2020)  . meloxicam (MOBIC) 15 MG tablet Take 15 mg by mouth daily. (Patient not taking: Reported on 04/10/2020)   No facility-administered encounter medications on file as of 04/02/2020.    Current Diagnosis/Assessment:  SDOH Interventions     Most Recent Value  SDOH Interventions  Financial Strain Interventions Other (Comment)  [Will help patient apply for Remicade patient assistance program through [employer]]      Goals Addressed            This Visit's Progress   . Pharmacy Care Plan       CARE PLAN ENTRY (see longitudinal plan of care for additional care plan information)  Current Barriers:  . Chronic Disease Management support, education, and care coordination needs related to Hypertension and Ulcerative colitis   Hypertension BP Readings from Last 3 Encounters:  02/26/20 (!) 132/94  02/12/20 138/87  06/04/19 120/60   . Pharmacist Clinical Goal(s): o Over the next 180 days, patient will work with PharmD and providers to maintain BP goal <140/90 . Current regimen:  o Olmesartan 68m daily . Interventions: o Provided dietary and exercise recommendations o Recommend patient check blood pressure at different times during the day . Patient self care activities - Over the next 180 days, patient will: o Check BP twice weekly, document, and provide at future appointments o Ensure daily salt intake < 2300 mg/day  Ulcerative colitis . Pharmacist Clinical Goal(s) o Over the next 180 days, patient will work with PharmD and providers to control symptoms of ulcerative colitis . Current  regimen:   Methotrexate 1g injection every week  Folic acid 365mdaily  Infliximab (Renflexis) 10066mnfusion every 4 weeks . Interventions: o Contacted [employer] Remicade patient assistance program o A new application for Remicade patient assistance program is being sent to the office. Will assist patient with completing application - Proof of income (2021 Tax Form 1044), 2021 out of pocket spending reports from pharmacy, and copies of all insurance cards required to be sent with application . Patient self care activities - Over the next 90 days, patient will: o Provide necessary documents and sign patient portion of patient assistance application  Insomnia . Pharmacist Clinical Goal(s) o Over the next 90 days, patient will work with PharmD and providers to improve sleep . Current regimen:   Temazepam 7.5mg65m bedtime as needed for sleep  Amitriptyline 25mg43mbedtime . Interventions: o Collaborate with PCP to send in prescription for Temazepam o Discuss with PCP the potential need for referral to therapist for symptoms of depression o Provided patient education documents regarding sleep hygiene . Patient self care activities - Over the next 90 days, patient will: o Practice good sleep hygiene   Medication management . Pharmacist Clinical Goal(s): o Over the  next 90 days, patient will work with PharmD and providers to achieve optimal medication adherence . Current pharmacy: [facility] . Interventions o Comprehensive medication review performed. o Utilize [facility] for medication synchronization, packaging and delivery o Clarify dosing directions for gabapentin and celecoxib, and recommend discontinuation of dicyclomine . Patient self care activities - Over the next 90 days, patient will: o Focus on medication adherence by medication synchronization and adherence packaging o Take medications as prescribed o Report any  questions or concerns to PharmD and/or provider(s)  Initial goal documentation       Hypertension   BP goal is:  <140/90  Office blood pressures are  BP Readings from Last 3 Encounters:  02/26/20 (!) 132/94  02/12/20 138/87  06/04/19 120/60   Patient checks BP at home 1-2x per week Patient home BP readings are ranging: 130/78, 130/92  Patient has failed these meds in the past: Losartan Patient is currently controlled on the following medications:  . Olmesartan 56m daily  We discussed:  Advised pt to alternate times of the day when she is checking her BP Diet extensively Does not have a great appetite Not a big breakfast eater, sometimes only eats once a day Chicken, salmon, steak Fruit cups and fruit Flavored water, juice, and water Weakness is chocolate and potato chips Avoids cheese and not a lot of fast foods Exercise extensively Used to exercise a lot (walking dog), but does not exercise much since dog died Recommend 30 minutes of exercise 5 times weekly Is going to try water aerobics (niece is an [employer] therapist  Plan Continue current medications   Ulcerative Colitis   Patient has failed these meds in past: Prednisone, Humira, Xeljanz, mesalamine Patient is currently uncontrolled on the following medications:   Methotrexate 1g injection every week  Folic acid 321mdaily  Infliximab (Renflexis) 10064mnfusion every 4 weeks  We discussed:   Remicade helped symptoms tremendously  Was getting copay assistance but that stopped at the beginning of the year  Switched to Renflexis, but pt says this does not work as well for her  Pt feels like she has flare ups more frequently than when she was taking Remicade  Contacted [employer] review eligibility criteria for Remicade   Spoke with representative, [employer] 04/10/20 who states that patient was denied patient assistance in September 2018 due to not meeting out of  pocket spending requirement  A new application is being generated and faxed to the office for completion. Pt will need to complete and include 2020 1044 tax document along with copies of insurance cards and out of pocket spending report from the pharmacy for 2021.   Plan Continue current medications  Assist patient with completing patient assistance application for Remicade  Insomnia   Patient has failed these meds in past: Alprazolam, zolpidem Patient is currently uncontrolled on the following medications:   Temazepam 7.5mg34m bedtime as needed for sleep  Amitriptyline 25mg21mbedtime  We discussed:    Pt complains of chronic fatigue that has been going on for several months  Pt states that if she doesn't take amitriptyline she only sleeps for 2 hours  Feels depressed, especially since dog died in June  Goes to bed early (8:30PM), will fall asleep before 10PM, but wakes up at 2-4AM and can't go back to sleep  Does not sleep during the day  Insomnia has been going on for a few months  Pt is displaying and admits to symptoms  of depression  Pt is open to the idea of talking to a therapist  Plan Continue current medications  Discuss need for referral to therapist with PCP Coordinate with PCP for refill of Temazepam   Neuropathy   Patient has failed these meds in past: Lyrica Patient is currently controlled on the following medications:   Gabapentin 363m 2 capsules 3 times daily  We discussed:  If pt takes gabapentin during the day it makes her too sleepy  She also mentioned some dizziness when taking gabapentin during the day  She states that her doctor told her she could take 4 capsules at bedtime, instead of throughout the day  Pt asked if this was too high of a dose for her to take  Advised pt that this is on the higher end, but is not too high. She needs to monitor her symptoms and side effects  Plan Continue current medications  Contact prescribing  doctor regarding dosing clarification for gabapentin  Pain   Patient has failed these meds in past: Hydrocodone/APAP, meloxicam, morphine, naproxen Patient is currently controlled on the following medications:   Celecoxib 2019mtwice daily  Tizanidine 71m67mhree times daily as needed  Tramadol 1m15mery 6 hours as needed for moderate pain  We discussed:  Pt states that she take celecoxib twice daily with food  Pt takes tizanidine and tramadol as needed for pain  She usually takes tramadol at night, but not daily  She states that tizanidine helps with pain and asked if there was a higher dosage  Advised pt to discuss with her doctor at next appointment (8/15)  Plan Continue current medications  Contact prescribing doctor regarding dosing clarification for celecoxib  Irritable Bowel Syndrome   Patient has failed these meds in past: Hyoscyamine, Viberzi Patient is currently controlled on the following medications:   Dicyclomine 20mg37mbedtime  We discussed:    Pt states that she takes dicyclomine up to four times daily as needed  Plan Recommend discontinuing dicyclomine due to ulcerative colitis and reports of diarrhea  Diarrhea   Patient has failed these meds in past: N/A Patient is currently controlled on the following medications:   Loperamide 71mg 476mmes daily as needed for loose stools or diarrhea  We discussed:   Pt states that she feels that the prescription loperamide works better for her than the over the counter  Advised pt that both versions have the same strength and the same active ingredient. The difference could be the dosage form (tablet/capsule)  Medication was originally prescribed by an Urgent care provider  Plan Continue current medications  Collaborate with PCP staff to request prescription for loperamide   Health Maintenance   Patient is currently on the following medications:  . Cetirizine 10mg d60m . Acetaminophen  500mg . 67mnsetron 4mg ever43m hours as needed for nausea or vomiting . Ventolin HFA as needed for shortness of breath/coughing during asthma flare   We discussed:   . Pt takes cetrizine and Tylenol before infliximab infusions . Pt states she does not use albuterol inhaler often, but sometimes needs it seasonally for flares  Plan Continue current medications  Collaborate with PCP staff to request a prescription for Ventolin  Vaccines   Reviewed and discussed patient's vaccination history.    Immunization History  Administered Date(s) Administered  . Influenza,inj,Quad PF,6+ Mos 06/04/2019  . Influenza-Unspecified 08/09/2018  . Moderna SARS-COVID-2 Vaccination 11/13/2019, 12/11/2019   Plan Discuss at follow up  Medication Management   Pt  uses [facility] for all medications Uses pill box? No - Unknown Pt endorses 99% compliance  We discussed:  . Importance of taking each medications daily as directed . Medication synchronization, adherence packaging, and delivery available with [facility]  Plan Utilize [facility] for medication synchronization, packaging and delivery  Verbal consent obtained for [facility] enhanced pharmacy services (medication synchronization, adherence packaging, delivery coordination). A medication sync plan was created to allow patient to get all medications delivered once every 30 to 90 days per patient preference. Patient understands they have freedom to choose pharmacy and clinical pharmacist will coordinate care between all prescribers and [facility].  Follow up: 1 month phone visit  Jannette Fogo, PharmD Clinical Pharmacist [facility] [phone]

## 2020-04-06 ENCOUNTER — Other Ambulatory Visit: Payer: Self-pay | Admitting: Internal Medicine

## 2020-04-06 DIAGNOSIS — Z1231 Encounter for screening mammogram for malignant neoplasm of breast: Secondary | ICD-10-CM

## 2020-04-09 DIAGNOSIS — K5190 Ulcerative colitis, unspecified, without complications: Secondary | ICD-10-CM | POA: Diagnosis not present

## 2020-04-09 DIAGNOSIS — M5416 Radiculopathy, lumbar region: Secondary | ICD-10-CM | POA: Diagnosis not present

## 2020-04-09 DIAGNOSIS — M545 Low back pain: Secondary | ICD-10-CM | POA: Diagnosis not present

## 2020-04-09 DIAGNOSIS — K5090 Crohn's disease, unspecified, without complications: Secondary | ICD-10-CM | POA: Diagnosis not present

## 2020-04-10 NOTE — Patient Instructions (Addendum)
Visit Information  Goals Addressed            This Visit's Progress   . Pharmacy Care Plan       CARE PLAN ENTRY (see longitudinal plan of care for additional care plan information)  Current Barriers:  . Chronic Disease Management support, education, and care coordination needs related to Hypertension and Ulcerative colitis   Hypertension BP Readings from Last 3 Encounters:  02/26/20 (!) 132/94  02/12/20 138/87  06/04/19 120/60   . Pharmacist Clinical Goal(s): o Over the next 180 days, patient will work with PharmD and providers to maintain BP goal <140/90 . Current regimen:  o Olmesartan 74m daily . Interventions: o Provided dietary and exercise recommendations o Recommend patient check blood pressure at different times during the day . Patient self care activities - Over the next 180 days, patient will: o Check BP twice weekly, document, and provide at future appointments o Ensure daily salt intake < 2300 mg/day  Ulcerative colitis . Pharmacist Clinical Goal(s) o Over the next 180 days, patient will work with PharmD and providers to control symptoms of ulcerative colitis . Current regimen:   Methotrexate 1g injection every week  Folic acid 356mdaily  Infliximab (Renflexis) 1003mnfusion every 4 weeks . Interventions: o Contacted [employer] Rem[employer]ade patient assistance program o A new application for Remicade patient assistance program is being sent to the office. Will assist patient with completing application - Proof of income (2021 Tax Form 1044), 2021 out of pocket spending reports from pharmacy, and copies of all insurance cards required to be sent with application . Patient self care activities - Over the next 90 days, patient will: o Provide necessary documents and sign patient portion of patient assistance application  Insomnia . Pharmacist Clinical Goal(s) o Over the next 90 days, patient will work with  PharmD and providers to improve sleep . Current regimen:   Temazepam 7.5mg16m bedtime as needed for sleep  Amitriptyline 25mg75mbedtime . Interventions: o Collaborate with PCP to send in prescription for Temazepam o Discuss with PCP the potential need for referral to therapist for symptoms of depression o Provided patient education documents regarding sleep hygiene . Patient self care activities - Over the next 90 days, patient will: o Practice good sleep hygiene   Medication management . Pharmacist Clinical Goal(s): o Over the next 90 days, patient will work with PharmD and providers to achieve optimal medication adherence . Current pharmacy: [facility] . Interventions o Comprehensive medication review performed. o Utilize [facility] for medication synchronization, packaging and delivery o Clarify dosing directions for gabapentin and celecoxib, and recommend discontinuation of dicyclomine . Patient self care activities - Over the next 90 days, patient will: o Focus on medication adherence by medication synchronization and adherence packaging o Take medications as prescribed o Report any questions or concerns to PharmD and/or provider(s)  Initial goal documentation        Ms. JeffrAsmusgiven information about Chronic Care Management services today including:  1. CCM service includes personalized support from designated clinical staff supervised by her physician, including individualized plan of care and coordination with other care providers 2. 24/7 contact phone numbers for assistance for urgent and routine care needs. 3. Standard insurance, coinsurance, copays and deductibles apply for chronic care management only during months in which we provide at least 20 minutes of these services. Most insurances cover these services at 100%, however patients may be responsible for any  copay, coinsurance and/or deductible if applicable. This service may help you avoid the need for  more expensive face-to-face services. 4. Only one practitioner may furnish and bill the service in a calendar month. 5. The patient may stop CCM services at any time (effective at the end of the month) by phone call to the office staff.  Patient agreed to services and verbal consent obtained.   The patient verbalized understanding of instructions provided today and agreed to receive a mailed copy of patient instruction and/or educational materials. Telephone follow up appointment with pharmacy team member scheduled for: 05/01/20 @ 2:30 PM  Jannette Fogo, PharmD Clinical Pharmacist [facility] [phone]    Insomnia Insomnia is a sleep disorder that makes it difficult to fall asleep or stay asleep. Insomnia can cause fatigue, low energy, difficulty concentrating, mood swings, and poor performance at work or school. There are three different ways to classify insomnia:  Difficulty falling asleep.  Difficulty staying asleep.  Waking up too early in the morning. Any type of insomnia can be long-term (chronic) or short-term (acute). Both are common. Short-term insomnia usually lasts for three months or less. Chronic insomnia occurs at least three times a week for longer than three months. What are the causes? Insomnia may be caused by another condition, situation, or substance, such as:  Anxiety.  Certain medicines.  Gastroesophageal reflux disease (GERD) or other gastrointestinal conditions.  Asthma or other breathing conditions.  Restless legs syndrome, sleep apnea, or other sleep disorders.  Chronic pain.  Menopause.  Stroke.  Abuse of alcohol, tobacco, or illegal drugs.  Mental health conditions, such as depression.  Caffeine.  Neurological disorders, such as Alzheimer's disease.  An overactive thyroid (hyperthyroidism). Sometimes, the cause of insomnia may not be known. What increases the risk? Risk factors for insomnia  include:  Gender. Women are affected more often than men.  Age. Insomnia is more common as you get older.  Stress.  Lack of exercise.  Irregular work schedule or working night shifts.  Traveling between different time zones.  Certain medical and mental health conditions. What are the signs or symptoms? If you have insomnia, the main symptom is having trouble falling asleep or having trouble staying asleep. This may lead to other symptoms, such as:  Feeling fatigued or having low energy.  Feeling nervous about going to sleep.  Not feeling rested in the morning.  Having trouble concentrating.  Feeling irritable, anxious, or depressed. How is this diagnosed? This condition may be diagnosed based on:  Your symptoms and medical history. Your health care provider may ask about: ? Your sleep habits. ? Any medical conditions you have. ? Your mental health.  A physical exam. How is this treated? Treatment for insomnia depends on the cause. Treatment may focus on treating an underlying condition that is causing insomnia. Treatment may also include:  Medicines to help you sleep.  Counseling or therapy.  Lifestyle adjustments to help you sleep better. Follow these instructions at home: Eating and drinking   Limit or avoid alcohol, caffeinated beverages, and cigarettes, especially close to bedtime. These can disrupt your sleep.  Do not eat a large meal or eat spicy foods right before bedtime. This can lead to digestive discomfort that can make it hard for you to sleep. Sleep habits   Keep a sleep diary to help you and your health care provider figure out what could be causing your insomnia. Write down: ? When you sleep. ? When you wake up during  the night. ? How well you sleep. ? How rested you feel the next day. ? Any side effects of medicines you are taking. ? What you eat and drink.  Make your bedroom a dark, comfortable place where it is easy to fall  asleep. ? Put up shades or blackout curtains to block light from outside. ? Use a white noise machine to block noise. ? Keep the temperature cool.  Limit screen use before bedtime. This includes: ? Watching TV. ? Using your smartphone, tablet, or computer.  Stick to a routine that includes going to bed and waking up at the same times every day and night. This can help you fall asleep faster. Consider making a quiet activity, such as reading, part of your nighttime routine.  Try to avoid taking naps during the day so that you sleep better at night.  Get out of bed if you are still awake after 15 minutes of trying to sleep. Keep the lights down, but try reading or doing a quiet activity. When you feel sleepy, go back to bed. General instructions  Take over-the-counter and prescription medicines only as told by your health care provider.  Exercise regularly, as told by your health care provider. Avoid exercise starting several hours before bedtime.  Use relaxation techniques to manage stress. Ask your health care provider to suggest some techniques that may work well for you. These may include: ? Breathing exercises. ? Routines to release muscle tension. ? Visualizing peaceful scenes.  Make sure that you drive carefully. Avoid driving if you feel very sleepy.  Keep all follow-up visits as told by your health care provider. This is important. Contact a health care provider if:  You are tired throughout the day.  You have trouble in your daily routine due to sleepiness.  You continue to have sleep problems, or your sleep problems get worse. Get help right away if:  You have serious thoughts about hurting yourself or someone else. If you ever feel like you may hurt yourself or others, or have thoughts about taking your own life, get help right away. You can go to your nearest emergency department or call:  Your local emergency services (911 in the [place]  A suicide crisis  helpline, such as the [place] at 954-176-2313. This is open 24 hours a day. Summary  Insomnia is a sleep disorder that makes it difficult to fall asleep or stay asleep.  Insomnia can be long-term (chronic) or short-term (acute).  Treatment for insomnia depends on the cause. Treatment may focus on treating an underlying condition that is causing insomnia.  Keep a sleep diary to help you and your health care provider figure out what could be causing your insomnia. This information is not intended to replace advice given to you by your health care provider. Make sure you discuss any questions you have with your health care provider. Document Revised: 08/04/2017 Document Reviewed: 06/01/2017 Elsevier Patient Education  2020 [employer].

## 2020-04-13 ENCOUNTER — Other Ambulatory Visit: Payer: Self-pay | Admitting: Nurse Practitioner

## 2020-04-13 DIAGNOSIS — G4700 Insomnia, unspecified: Secondary | ICD-10-CM

## 2020-04-13 MED ORDER — TEMAZEPAM 7.5 MG PO CAPS: 7.5000 mg | ORAL_CAPSULE | Freq: Every evening | ORAL | 5 refills | Status: DC | PRN

## 2020-04-13 MED ORDER — ALBUTEROL SULFATE HFA 108 (90 BASE) MCG/ACT IN AERS
2.0000 | INHALATION_SPRAY | Freq: Four times a day (QID) | RESPIRATORY_TRACT | 2 refills | Status: DC | PRN
Start: 2020-04-13 — End: 2022-05-30

## 2020-04-13 MED ORDER — LOPERAMIDE HCL 2 MG PO CAPS: 2.0000 mg | ORAL_CAPSULE | Freq: Four times a day (QID) | ORAL | 0 refills | Status: DC | PRN

## 2020-04-14 ENCOUNTER — Other Ambulatory Visit: Payer: Self-pay

## 2020-04-14 ENCOUNTER — Ambulatory Visit
Admission: RE | Admit: 2020-04-14 | Discharge: 2020-04-14 | Disposition: A | Discharge status: Home / Self Care | Payer: Medicare HMO | Source: Ambulatory Visit | Attending: Internal Medicine | Admitting: Internal Medicine

## 2020-04-14 DIAGNOSIS — Z1231 Encounter for screening mammogram for malignant neoplasm of breast: Secondary | ICD-10-CM | POA: Diagnosis not present

## 2020-04-15 DIAGNOSIS — M5416 Radiculopathy, lumbar region: Secondary | ICD-10-CM | POA: Diagnosis not present

## 2020-04-17 ENCOUNTER — Telehealth: Payer: Self-pay

## 2020-04-17 DIAGNOSIS — R319 Hematuria, unspecified: Secondary | ICD-10-CM | POA: Diagnosis not present

## 2020-04-17 DIAGNOSIS — N12 Tubulo-interstitial nephritis, not specified as acute or chronic: Secondary | ICD-10-CM | POA: Diagnosis not present

## 2020-04-17 NOTE — Chronic Care Management (AMB) (Signed)
Chronic Care Management Pharmacy Assistant   Name: MIRTA MALLY  MRN: [id number] DOB: May 01, 1966  Reason for Encounter: Medication Review/Care Coordination with Outside Provider  PCP : [name], FNP  Allergies:   Allergies  Allergen Reactions  . Humira [Adalimumab] Anaphylaxis  . Ampicillin Hives and Itching  . Codeine   . Compazine [Prochlorperazine Maleate] Other (See Comments)    "Stroke-like symptoms" Can tolerate Phenergan.  [name] Cymbalta [Duloxetine Hcl] Other (See Comments)    Fainting  . Imitrex [Sumatriptan Base] Hives and Nausea And Vomiting  . Lyrica [Pregabalin] Other (See Comments)    Fainting  . Metoclopramide Hcl Hives  . Percocet [Oxycodone-Acetaminophen] Itching  . Effexor [Venlafaxine Hydrochloride] Hives and Palpitations  . Tape Itching and Rash    Medications: Outpatient Encounter Medications as of 04/17/2020  Medication Sig  . albuterol (VENTOLIN HFA) 108 (90 Base) MCG/ACT inhaler Inhale 2 puffs into the lungs every 6 (six) hours as needed for wheezing or shortness of breath.  [name] amitriptyline (ELAVIL) 25 MG tablet Take 25 mg by mouth at bedtime.   . beclomethasone (QVAR) 80 MCG/ACT inhaler Inhale 1 puff into the lungs daily as needed (Shortness of Breath). (Patient not taking: Reported on 04/10/2020)  . celecoxib (CELEBREX) 200 MG capsule Take 200 mg by mouth 2 (two) times daily.   . cetirizine (ZYRTEC) 10 MG tablet Take 10 mg by mouth daily.  [name] dicyclomine (BENTYL) 20 MG tablet Take 20 mg by mouth 4 (four) times daily -  before meals and at bedtime. Up to four times daily as needed  . diphenhydrAMINE (BENADRYL) 25 mg capsule Take 25 mg by mouth daily as needed for itching. (Patient not taking: Reported on 04/10/2020)  . folic acid (FOLVITE) 1 MG tablet Take 3 mg by mouth daily.   [name] gabapentin (NEURONTIN) 300 MG capsule Take 2 capsules (600 mg total) by mouth 3 (three) times daily. (Patient taking differently: Take 1,200 mg by mouth at bedtime. )    . inFLIXimab (REMICADE) 100 MG injection Inject 100 mg into the vein every 30 (thirty) days. Infuse by intravenous route every 4 weeks  Patient is receiving Inflectra brand verses Remicade brand due to insurance (Patient not taking: Reported on 04/10/2020)  . inFLIXimab in sodium chloride 0.9 % Inject 10 mg/kg into the vein every 30 (thirty) days. Infuse every 4 weeks  . loperamide (IMODIUM) 2 MG capsule Take 1 capsule (2 mg total) by mouth 4 (four) times daily as needed for diarrhea or loose stools.  . meloxicam (MOBIC) 15 MG tablet Take 15 mg by mouth daily. (Patient not taking: Reported on 04/10/2020)  . methotrexate (50 MG/ML) 1 g injection Inject into the vein every Monday. 0.45m every Monday  . olmesartan (BENICAR) 20 MG tablet Take 1 tablet by mouth once daily  . ondansetron (ZOFRAN) 4 MG tablet Take 4 mg by mouth every 6 (six) hours as needed for nausea or vomiting.  . temazepam (RESTORIL) 7.5 MG capsule Take 1 capsule (7.5 mg total) by mouth at bedtime as needed for sleep.  .[name] (ZANAFLEX) 2 MG tablet Take 2 mg by mouth 3 (three) times daily.  . traMADol (ULTRAM) 50 MG tablet Take 50 mg by mouth every 6 (six) hours as needed for moderate pain.    No facility-administered encounter medications on file as of 04/17/2020.    Current Diagnosis: Patient Active Problem List   Diagnosis Date Noted  . Right-sided low back pain with right-sided sciatica 05/06/2019  . Right  sided numbness 03/27/2019  . Paresthesia 12/25/2018  . Essential hypertension 06/26/2018  . Abdominal pain, chronic, right lower quadrant 01/06/2015  . Nausea and vomiting 01/05/2015  . Ulcerative colitis ([place]) 01/05/2015  . Enteritis   . Nausea with vomiting   . Intractable nausea and vomiting 04/29/2014  . Benign paroxysmal positional vertigo 03/14/2013  . Lymphedema of arm 12/30/2011    Follow-Up:  Care Coordination with Outside Provider-Called [facility]- Dr Benson Norway office to inquire when patient  next scheduled appointment would be and to request prescription refills. Patient's next appointment with Dr Benson Norway is on 09/28/2020 @ 11 am. Left message on Dr Ulyses Amor CMA VM- Varney Biles to return call, requesting refills of Dicyclomine. Waiting on return call.   Received a call back from [facility], Dr [facility] CMA, he is out of the office today, in procedures all day. She recently spoke with patient regarding getting a prescription from Dr Benson Norway on the Dicyclomine to be sent to [facility], she has a request to him regarding this but she is not sure if Dr Benson Norway will approve medication, informed recommendations from the Pharmacist [facility] regarding Dicyclomine. Varney Biles requested a copy of the note to be faxed to Dr Benson Norway for review. Faxing over note. Jannette Fogo, CPP aware.   [name], [place] Pharmacist Assistant [phone]

## 2020-04-20 ENCOUNTER — Telehealth: Payer: Medicare HMO

## 2020-04-20 ENCOUNTER — Ambulatory Visit (INDEPENDENT_AMBULATORY_CARE_PROVIDER_SITE_OTHER): Payer: Medicare HMO

## 2020-04-20 ENCOUNTER — Other Ambulatory Visit: Payer: Self-pay

## 2020-04-20 DIAGNOSIS — K5180 Other ulcerative colitis without complications: Secondary | ICD-10-CM

## 2020-04-20 DIAGNOSIS — G8929 Other chronic pain: Secondary | ICD-10-CM

## 2020-04-20 DIAGNOSIS — I10 Essential (primary) hypertension: Secondary | ICD-10-CM

## 2020-04-20 DIAGNOSIS — M5441 Lumbago with sciatica, right side: Secondary | ICD-10-CM

## 2020-04-21 ENCOUNTER — Telehealth: Payer: Self-pay

## 2020-04-21 ENCOUNTER — Telehealth: Payer: Self-pay | Admitting: Neurology

## 2020-04-21 ENCOUNTER — Telehealth: Payer: Medicare HMO

## 2020-04-21 MED ORDER — GABAPENTIN 300 MG PO CAPS: 600.0000 mg | ORAL_CAPSULE | Freq: Three times a day (TID) | ORAL | 3 refills | Status: DC

## 2020-04-21 NOTE — Telephone Encounter (Signed)
  Chronic Care Management   Outreach Note  04/21/2020 Name: Gabrielle Hawkins MRN: [id number] DOB: 06/26/1966  Referred by: [name], FNP Reason for referral : Care Coordination   An unsuccessful telephone outreach was attempted today. The patient was referred to the case management team for assistance with care management and care coordination.   Follow Up Plan: A HIPPA compliant phone message was left for the patient providing contact information and requesting a return call.  The care management team will reach out to the patient again over the next 14 days.   [name], BSW, CDP Social Worker, Certified Dementia Practitioner [place] [facility] [place] [facility] [phone]

## 2020-04-21 NOTE — Telephone Encounter (Signed)
Spoke to [place], at [employer].  She stated [facility] did not have refills on gabapentin.  I relayed that last written 02-12-20 180 plus 3 refills.  [facility].  Amitriptyline is not a medication that we prescribe for her.

## 2020-04-21 NOTE — Telephone Encounter (Signed)
Tamala from [facility] called stating that the pt's amitriptyline (ELAVIL) 25 MG tablet and the gabapentin (NEURONTIN) 300 MG capsule need to be called in to [facility]

## 2020-04-21 NOTE — Chronic Care Management (AMB) (Signed)
Chronic Care Management Pharmacy Assistant   Name: Gabrielle Hawkins  MRN: [id number] DOB: 1966-03-16  Reason for Encounter: Medication Review/ Care Coordination with Outside Providers  PCP : Gabrielle Brine, FNP  Allergies:   Allergies  Allergen Reactions  . Humira [Adalimumab] Anaphylaxis  . Ampicillin Hives and Itching  . Codeine   . Compazine [Prochlorperazine Maleate] Other (See Comments)    "Stroke-like symptoms" Can tolerate Phenergan.  [name] Cymbalta [Duloxetine Hcl] Other (See Comments)    Fainting  . Imitrex [Sumatriptan Base] Hives and Nausea And Vomiting  . Lyrica [Pregabalin] Other (See Comments)    Fainting  . Metoclopramide Hcl Hives  . Percocet [Oxycodone-Acetaminophen] Itching  . Effexor [Venlafaxine Hydrochloride] Hives and Palpitations  . Tape Itching and Rash    Medications: Outpatient Encounter Medications as of 04/21/2020  Medication Sig  . albuterol (VENTOLIN HFA) 108 (90 Base) MCG/ACT inhaler Inhale 2 puffs into the lungs every 6 (six) hours as needed for wheezing or shortness of breath.  [name] amitriptyline (ELAVIL) 25 MG tablet Take 25 mg by mouth at bedtime.   . beclomethasone (QVAR) 80 MCG/ACT inhaler Inhale 1 puff into the lungs daily as needed (Shortness of Breath). (Patient not taking: Reported on 04/10/2020)  . celecoxib (CELEBREX) 200 MG capsule Take 200 mg by mouth 2 (two) times daily.   . cetirizine (ZYRTEC) 10 MG tablet Take 10 mg by mouth daily.  [name] dicyclomine (BENTYL) 20 MG tablet Take 20 mg by mouth 4 (four) times daily -  before meals and at bedtime. Up to four times daily as needed  . diphenhydrAMINE (BENADRYL) 25 mg capsule Take 25 mg by mouth daily as needed for itching. (Patient not taking: Reported on 04/10/2020)  . folic acid (FOLVITE) 1 MG tablet Take 3 mg by mouth daily.   [name] gabapentin (NEURONTIN) 300 MG capsule Take 2 capsules (600 mg total) by mouth 3 (three) times daily. (Patient taking differently: Take 1,200 mg by mouth at bedtime. )   . inFLIXimab (REMICADE) 100 MG injection Inject 100 mg into the vein every 30 (thirty) days. Infuse by intravenous route every 4 weeks  Patient is receiving Inflectra brand verses Remicade brand due to insurance (Patient not taking: Reported on 04/10/2020)  . inFLIXimab in sodium chloride 0.9 % Inject 10 mg/kg into the vein every 30 (thirty) days. Infuse every 4 weeks  . loperamide (IMODIUM) 2 MG capsule Take 1 capsule (2 mg total) by mouth 4 (four) times daily as needed for diarrhea or loose stools.  . meloxicam (MOBIC) 15 MG tablet Take 15 mg by mouth daily. (Patient not taking: Reported on 04/10/2020)  . methotrexate (50 MG/ML) 1 g injection Inject into the vein every Monday. 0.80m every Monday  . olmesartan (BENICAR) 20 MG tablet Take 1 tablet by mouth once daily  . ondansetron (ZOFRAN) 4 MG tablet Take 4 mg by mouth every 6 (six) hours as needed for nausea or vomiting.  . temazepam (RESTORIL) 7.5 MG capsule Take 1 capsule (7.5 mg total) by mouth at bedtime as needed for sleep.  .[name] (ZANAFLEX) 2 MG tablet Take 2 mg by mouth 3 (three) times daily.  . traMADol (ULTRAM) 50 MG tablet Take 50 mg by mouth every 6 (six) hours as needed for moderate pain.    No facility-administered encounter medications on file as of 04/21/2020.    Current Diagnosis: Patient Active Problem List   Diagnosis Date Noted  . Right-sided low back pain with right-sided sciatica 05/06/2019  . Right  sided numbness 03/27/2019  . Paresthesia 12/25/2018  . Essential hypertension 06/26/2018  . Abdominal pain, chronic, right lower quadrant 01/06/2015  . Nausea and vomiting 01/05/2015  . Ulcerative colitis ([place]) 01/05/2015  . Enteritis   . Nausea with vomiting   . Intractable nausea and vomiting 04/29/2014  . Benign paroxysmal positional vertigo 03/14/2013  . Lymphedema of arm 12/30/2011     Follow-Up:  Care Coordination with Outside Provider- [place] [facility]  for prescriptions to be sent to [facility] to start patient on an adherence program. Spoke with Gabrielle Hawkins at [place], she transferred me to Gabrielle Hawkins's line, Gabrielle Hawkins assistant. Left message of medications refills needed for Methotrexate, Folic Acid and Celebrex.  Called [facility], spoke with triage nurse, she is placing a message into Gabrielle Hawkins for refill of Amitriptyline and Gabapentin, she noticed that they have not prescribed the Tizanidine.  Called patient and inquired on Tizanidine. Patient states she gets it from Gabrielle Hawkins and she has an appointment with them tomorrow and will request this medication be sent to [facility]. Patient informed me that she is not out of any medications and will have enough for next week delivery. Patient delivery scheduled for 04/27/20. Patient aware Pharmacy will call prior to delivery.  Pharmacist Gabrielle Hawkins notified.  Gabrielle Hawkins, [place] Pharmacist Assistant [phone]

## 2020-04-22 DIAGNOSIS — M459 Ankylosing spondylitis of unspecified sites in spine: Secondary | ICD-10-CM | POA: Diagnosis not present

## 2020-04-22 DIAGNOSIS — Z6828 Body mass index (BMI) 28.0-28.9, adult: Secondary | ICD-10-CM | POA: Diagnosis not present

## 2020-04-22 DIAGNOSIS — R319 Hematuria, unspecified: Secondary | ICD-10-CM | POA: Diagnosis not present

## 2020-04-22 DIAGNOSIS — E663 Overweight: Secondary | ICD-10-CM | POA: Diagnosis not present

## 2020-04-22 DIAGNOSIS — Z79899 Other long term (current) drug therapy: Secondary | ICD-10-CM | POA: Diagnosis not present

## 2020-04-22 DIAGNOSIS — K5100 Ulcerative (chronic) pancolitis without complications: Secondary | ICD-10-CM | POA: Diagnosis not present

## 2020-04-22 DIAGNOSIS — M2550 Pain in unspecified joint: Secondary | ICD-10-CM | POA: Diagnosis not present

## 2020-04-23 NOTE — Chronic Care Management (AMB) (Signed)
Chronic Care Management   Follow Up Note   04/20/2020 Name: Gabrielle Hawkins MRN: [id number] DOB: Mar 08, 1966  Referred by: [name], FNP Reason for referral : Chronic Care Management ([facility] RN CM Call )   Gabrielle Hawkins is a 54 y.o. year old female who is a primary care patient of [name], [place]. The CCM team was consulted for assistance with chronic disease management and care coordination needs.    Review of patient status, including review of consultants reports, relevant laboratory and other test results, and collaboration with appropriate care team members and the patient's provider was performed as part of comprehensive patient evaluation and provision of chronic care management services.    SDOH (Social Determinants of Health) assessments performed: Yes - no acute needs identified at this time  See Care Plan activities for detailed interventions related to [place])   Placed outbound CCM RN CM follow up on a recent Urgent Care visit for dx: Pyelonephritis.     Outpatient Encounter Medications as of 04/20/2020  Medication Sig  . albuterol (VENTOLIN HFA) 108 (90 Base) MCG/ACT inhaler Inhale 2 puffs into the lungs every 6 (six) hours as needed for wheezing or shortness of breath.  [name] amitriptyline (ELAVIL) 25 MG tablet Take 25 mg by mouth at bedtime.   . beclomethasone (QVAR) 80 MCG/ACT inhaler Inhale 1 puff into the lungs daily as needed (Shortness of Breath). (Patient not taking: Reported on 04/10/2020)  . celecoxib (CELEBREX) 200 MG capsule Take 200 mg by mouth 2 (two) times daily.   . cetirizine (ZYRTEC) 10 MG tablet Take 10 mg by mouth daily.  [name] dicyclomine (BENTYL) 20 MG tablet Take 20 mg by mouth 4 (four) times daily -  before meals and at bedtime. Up to four times daily as needed  . diphenhydrAMINE (BENADRYL) 25 mg capsule Take 25 mg by mouth daily as needed for itching. (Patient not taking: Reported on 04/10/2020)  . folic acid (FOLVITE) 1 MG tablet Take 3 mg by mouth  daily.   [name] inFLIXimab (REMICADE) 100 MG injection Inject 100 mg into the vein every 30 (thirty) days. Infuse by intravenous route every 4 weeks  Patient is receiving Inflectra brand verses Remicade brand due to insurance (Patient not taking: Reported on 04/10/2020)  . inFLIXimab in sodium chloride 0.9 % Inject 10 mg/kg into the vein every 30 (thirty) days. Infuse every 4 weeks  . loperamide (IMODIUM) 2 MG capsule Take 1 capsule (2 mg total) by mouth 4 (four) times daily as needed for diarrhea or loose stools.  . meloxicam (MOBIC) 15 MG tablet Take 15 mg by mouth daily. (Patient not taking: Reported on 04/10/2020)  . methotrexate (50 MG/ML) 1 g injection Inject into the vein every Monday. 0.21m every Monday  . olmesartan (BENICAR) 20 MG tablet Take 1 tablet by mouth once daily  . ondansetron (ZOFRAN) 4 MG tablet Take 4 mg by mouth every 6 (six) hours as needed for nausea or vomiting.  . temazepam (RESTORIL) 7.5 MG capsule Take 1 capsule (7.5 mg total) by mouth at bedtime as needed for sleep.  .[name] (ZANAFLEX) 2 MG tablet Take 2 mg by mouth 3 (three) times daily.  . traMADol (ULTRAM) 50 MG tablet Take 50 mg by mouth every 6 (six) hours as needed for moderate pain.   . [DISCONTINUED] gabapentin (NEURONTIN) 300 MG capsule Take 2 capsules (600 mg total) by mouth 3 (three) times daily. (Patient taking differently: Take 1,200 mg by mouth at bedtime. )  No facility-administered encounter medications on file as of 04/20/2020.     Objective:  No results found for: HGBA1C Lab Results  Component Value Date   MICROALBUR 10 06/04/2019   LDLCALC 124 (H) 12/24/2009   CREATININE 0.89 02/18/2020   BP Readings from Last 3 Encounters:  02/26/20 (!) 132/94  02/12/20 138/87  06/04/19 120/60    Goals Addressed      Patient Stated   .  "to figure out why I am having urinary retention" (pt-stated)        [place] (see longitudinal plan of care for additional care plan information)  Current  Barriers:  [name] Knowledge Deficits related to evaluation and treatment of Impaired Urinary Elimination  . Chronic Disease Management support and education needs related to Essential hypertension, Ulcerative Colitis w/o complication, right-sided low back pain with right-sided sciatica   . Nurse Case Manager Clinical Goal(s):  [name] Over the next 90 days, patient will work with PCP to address needs related to evaluation and treatment of symptoms of Impaired Urinary Elimination including urinary retention, urinary hesitancy, and urinary urgency   CCM RN CM Interventions:  04/20/20 call completed with patient  . Inter-disciplinary care team collaboration (see longitudinal plan of care) . Evaluation of current treatment plan related to Impaired Urinary Elimination and patient's adherence to plan as established by provider . Determined patient experienced hematuria w/o other notable symptoms; Determined she went to the Urgent care in [place], [place] and was diagnosed with Pyelonephritis, a urine culture was obtained . Determined patient was prescribed Bactrim DS 800-160 mg bid x 7 days; educated on importance of completing the full course of antibiotics . 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 Discussed having the embedded Pharm D f/u with her concerning potential AE related to the Reflexis . Reviewed and discussed that patient is also taking Meloxicam and MTX as directed; Educated patient on potential SE related to these medications and when to call the doctor and or pharmacist if SE are noted  . Discussed plans with patient for ongoing care management follow up and provided patient with direct contact information for care management team  Patient Self Care Activities:  . Self administers medications as prescribed . Attends all scheduled  provider appointments . Calls pharmacy for medication refills . Performs ADL's independently . Performs IADL's independently . Calls provider office for new concerns or questions  Please see past updates related to this goal by clicking on the "Past Updates" button in the selected goal        Plan:   Telephone follow up appointment with care management team member scheduled for: 04/27/20  Barb Merino, RN, BSN, CCM Care Management Coordinator [place] [facility]  Direct Phone: [phone]

## 2020-04-23 NOTE — Patient Instructions (Signed)
Visit Information  Goals Addressed      Patient Stated   .  "to figure out why I am having urinary retention" (pt-stated)        CARE PLAN ENTRY (see longitudinal plan of care for additional care plan information)  Current Barriers:  [name] Knowledge Deficits related to evaluation and treatment of Impaired Urinary Elimination  . Chronic Disease Management support and education needs related to Essential hypertension, Ulcerative Colitis w/o complication, right-sided low back pain with right-sided sciatica   . Nurse Case Manager Clinical Goal(s):  [name] Over the next 90 days, patient will work with PCP to address needs related to evaluation and treatment of symptoms of Impaired Urinary Elimination including urinary retention, urinary hesitancy, and urinary urgency   CCM RN CM Interventions:  04/20/20 call completed with patient  . Inter-disciplinary care team collaboration (see longitudinal plan of care) . Evaluation of current treatment plan related to Impaired Urinary Elimination and patient's adherence to plan as established by provider . Determined patient experienced hematuria w/o other notable symptoms; Determined she went to the Urgent care in [place], [place] and was diagnosed with Pyelonephritis, a urine culture was obtained . Determined patient was prescribed Bactrim DS 800-160 mg bid x 7 days; educated on importance of completing the full course of antibiotics . 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 Discussed having the embedded Pharm D f/u with her concerning potential AE related to the Reflexis . Reviewed and discussed that patient is also taking Meloxicam and MTX as directed; Educated patient on potential SE related to these medications and when to call the doctor and or pharmacist if SE are noted  . Discussed plans with  patient for ongoing care management follow up and provided patient with direct contact information for care management team  Patient Self Care Activities:  . Self administers medications as prescribed . Attends all scheduled provider appointments . Calls pharmacy for medication refills . Performs ADL's independently . Performs IADL's independently . Calls provider office for new concerns or questions  Please see past updates related to this goal by clicking on the "Past Updates" button in the selected goal         Patient verbalizes understanding of instructions provided today.   Telephone follow up appointment with care management team member scheduled for: 04/27/20  Barb Merino, RN, BSN, CCM Care Management Coordinator [place] [facility]  Direct Phone: [phone]

## 2020-04-24 ENCOUNTER — Other Ambulatory Visit: Payer: Self-pay | Admitting: Neurology

## 2020-04-27 ENCOUNTER — Telehealth: Payer: Medicare HMO

## 2020-04-28 ENCOUNTER — Ambulatory Visit: Payer: Medicare HMO

## 2020-04-28 DIAGNOSIS — I10 Essential (primary) hypertension: Secondary | ICD-10-CM | POA: Diagnosis not present

## 2020-04-28 DIAGNOSIS — K5180 Other ulcerative colitis without complications: Secondary | ICD-10-CM

## 2020-04-29 DIAGNOSIS — K5190 Ulcerative colitis, unspecified, without complications: Secondary | ICD-10-CM | POA: Diagnosis not present

## 2020-04-29 NOTE — Patient Instructions (Signed)
Social Worker Visit Information  Goals we discussed today:  Goals Addressed              This Visit's Progress     Patient Stated   .  "I need help with roof repairs and heating" (pt-stated)        CARE PLAN ENTRY (see longitudinal plan of care for additional care plan information)  Current Barriers:  . Financial constraints related to cost of home modification needs . Limited knowledge of community resources to assist with home modifications . Chronic conditions including HTN and ulcerative colitis which impact patient ability to perform iADL's in the home   Social Work Clinical Goal(s):  [name] Over the next 30 days the patient will work with SW to identify resources to assist with home modifications  CCM SW Interventions: Completed 04/28/20 . Inter-disciplinary care team collaboration (see longitudinal plan of care) . Successful outbound call placed to the patient to assist with care coordination needs . Determined the patient is in need of home modification resources o The patient reports she has a leaky roof and is worried she may have mold in her home from the leaks o The patient indicates she heats her home with a gas stove located in the basement. It is difficult for patient to locate wood, go up and down stairs, and lift wood to place into wood stove o The patient is interested in resources to help with cost and installation of electric heating unit . Advised the patient SW was not familiar with her county but would look into resources . Scheduled follow up call with the patient over the next week . Successful outbound call placed to Lajuana Ripple with [place] to inquire of local programs for home modifications o SW advised to contact [place] ([place]) for assistance . Outbound call placed to [facility] voice message left requesting return call  Patient Self Care Activities:  . Patient verbalizes understanding of plan to work with SW to identify  resources to assist with care coordination needs . Self administers medications as prescribed . Attends all scheduled provider appointments . Performs ADL's independently . Calls provider office for new concerns or questions  Initial goal documentation     .  COMPLETED: "I would like to complete an advance directive" (pt-stated)        CARE PLAN ENTRY (see longtitudinal plan of care for additional care plan information)  Current Barriers:  . Limited education about how to name a healthcare power of attorney . Chronic Conditions including HTN and Ulcerative Colitis which put the patient at increased risk for hospitalization  Social Work Clinical Goal(s):  [name] Over the next 20 days, the patient will review mailed Advance Directive packet . Over the next 30 days, patient will verbalize basic understanding of Advanced Directives and importance of completion  CCM Interventions: Completed 04/28/20 . Successful outbound call placed to the patient to assess goal progression . Determined the patient has received mailed advance directive packet and has no questions at this time how to complete the form . Discussed importance of providing completed copy to the provider office to have it scanned into her file  . Encouraged the patient to contact SW as needed for assistance with completion . Goal met  Completed 03/18/20 . Interviewed patient about Financial controller and provided education about the importance of completing advanced directives . Mailed the patient an Emergency planning/management officer . Advised patient to review information mailed by this SW .  Scheduled follow up call to the patient over the next month to review mailed resource  Patient Self Care Activities:  . Can read and write at 6th grading reading level . Is able to readily make contact with social support system . Can identify next of kin, power or attorney, guardian, or primary caregiver . Attends all scheduled provider  appointments . Performs ADLs independently . Performs IADLs independently  Please see past updates related to this goal by clicking on the "Past Updates" button in the selected goal       .  "to figure out why I am having urinary retention" (pt-stated)        [place] (see longitudinal plan of care for additional care plan information)  Current Barriers:  [name] Knowledge Deficits related to evaluation and treatment of Impaired Urinary Elimination  . Chronic Disease Management support and education needs related to Essential hypertension, Ulcerative Colitis w/o complication, right-sided low back pain with right-sided sciatica   . Nurse Case Manager Clinical Goal(s):  [name] Over the next 90 days, patient will work with PCP to address needs related to evaluation and treatment of symptoms of Impaired Urinary Elimination including urinary retention, urinary hesitancy, and urinary urgency   CCM RN CM Interventions:  04/20/20 call completed with patient  . Inter-disciplinary care team collaboration (see longitudinal plan of care) . Evaluation of current treatment plan related to Impaired Urinary Elimination and patient's adherence to plan as established by provider . Determined patient experienced hematuria w/o other notable symptoms; Determined she went to the Urgent care in [place], [place] and was diagnosed with Pyelonephritis, a urine culture was obtained . Determined patient was prescribed Bactrim DS 800-160 mg bid x 7 days; educated on importance of completing the full course of antibiotics . 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 Discussed having the embedded Pharm D f/u with her concerning potential AE related to the Reflexis . Reviewed and discussed that patient is also taking Meloxicam and MTX as directed; Educated patient  on potential SE related to these medications and when to call the doctor and or pharmacist if SE are noted  . Discussed plans with patient for ongoing care management follow up and provided patient with direct contact information for care management team  CCM SW Interventions Completed 04/28/20 . Successful outbound call placed to the patient to assist with care coordination needs . The patient reports she no longer has blood in her urine but is being referred to urologist . Advised the patient SW would collaborate with RN Care Manager to provide update on intervention . Encouraged the patient to contact RN Care Manager directly as needed for assistance in resource navigation  Patient Self Care Activities:  . Self administers medications as prescribed . Attends all scheduled provider appointments . Calls pharmacy for medication refills . Performs ADL's independently . Performs IADL's independently . Calls provider office for new concerns or questions  Please see past updates related to this goal by clicking on the "Past Updates" button in the selected goal          Follow Up Plan: SW will follow up with patient by phone over the next week.   [name], BSW, CDP Social Worker, Certified Dementia Practitioner [place] [facility] [phone]

## 2020-04-29 NOTE — Chronic Care Management (AMB) (Signed)
Chronic Care Management    Social Work Follow Up Note  04/28/2020 Name: REANNAH TOTTEN MRN: [id number] DOB: Jul 20, 1966  Gabrielle Hawkins is a 54 y.o. year old female who is a primary care patient of [name], [place]. The CCM team was consulted for assistance with care coordination.   Review of patient status, including review of consultants reports, other relevant assessments, and collaboration with appropriate care team members and the patient's provider was performed as part of comprehensive patient evaluation and provision of chronic care management services.    SDOH (Social Determinants of Health) assessments performed: No    Outpatient Encounter Medications as of 04/28/2020  Medication Sig  . albuterol (VENTOLIN HFA) 108 (90 Base) MCG/ACT inhaler Inhale 2 puffs into the lungs every 6 (six) hours as needed for wheezing or shortness of breath.  [name] amitriptyline (ELAVIL) 25 MG tablet Take 25 mg by mouth at bedtime.   . beclomethasone (QVAR) 80 MCG/ACT inhaler Inhale 1 puff into the lungs daily as needed (Shortness of Breath). (Patient not taking: Reported on 04/10/2020)  . celecoxib (CELEBREX) 200 MG capsule Take 200 mg by mouth 2 (two) times daily.   . cetirizine (ZYRTEC) 10 MG tablet Take 10 mg by mouth daily.  [name] dicyclomine (BENTYL) 20 MG tablet Take 20 mg by mouth 4 (four) times daily -  before meals and at bedtime. Up to four times daily as needed  . diphenhydrAMINE (BENADRYL) 25 mg capsule Take 25 mg by mouth daily as needed for itching. (Patient not taking: Reported on 04/10/2020)  . folic acid (FOLVITE) 1 MG tablet Take 3 mg by mouth daily.   [name] gabapentin (NEURONTIN) 300 MG capsule Take 2 capsules (600 mg total) by mouth 3 (three) times daily.  [name] inFLIXimab (REMICADE) 100 MG injection Inject 100 mg into the vein every 30 (thirty) days. Infuse by intravenous route every 4 weeks  Patient is receiving Inflectra brand verses Remicade brand due to insurance (Patient not taking:  Reported on 04/10/2020)  . inFLIXimab in sodium chloride 0.9 % Inject 10 mg/kg into the vein every 30 (thirty) days. Infuse every 4 weeks  . loperamide (IMODIUM) 2 MG capsule Take 1 capsule (2 mg total) by mouth 4 (four) times daily as needed for diarrhea or loose stools.  . meloxicam (MOBIC) 15 MG tablet Take 15 mg by mouth daily. (Patient not taking: Reported on 04/10/2020)  . methotrexate (50 MG/ML) 1 g injection Inject into the vein every Monday. 0.21m every Monday  . olmesartan (BENICAR) 20 MG tablet Take 1 tablet by mouth once daily  . ondansetron (ZOFRAN) 4 MG tablet Take 4 mg by mouth every 6 (six) hours as needed for nausea or vomiting.  . temazepam (RESTORIL) 7.5 MG capsule Take 1 capsule (7.5 mg total) by mouth at bedtime as needed for sleep.  .[name] (ZANAFLEX) 2 MG tablet Take 2 mg by mouth 3 (three) times daily.  . traMADol (ULTRAM) 50 MG tablet Take 50 mg by mouth every 6 (six) hours as needed for moderate pain.    No facility-administered encounter medications on file as of 04/28/2020.     Goals Addressed              This Visit's Progress     Patient Stated   .  "I need help with roof repairs and heating" (pt-stated)        CARE PLAN ENTRY (see longitudinal plan of care for additional care plan information)  Current Barriers:  . Financial  constraints related to cost of home modification needs . Limited knowledge of community resources to assist with home modifications . Chronic conditions including HTN and ulcerative colitis which impact patient ability to perform iADL's in the home   Social Work Clinical Goal(s):  [name] Over the next 30 days the patient will work with SW to identify resources to assist with home modifications  CCM SW Interventions: Completed 04/28/20 . Inter-disciplinary care team collaboration (see longitudinal plan of care) . Successful outbound call placed to the patient to assist with care coordination needs . Determined the patient is in need  of home modification resources o The patient reports she has a leaky roof and is worried she may have mold in her home from the leaks o The patient indicates she heats her home with a gas stove located in the basement. It is difficult for patient to locate wood, go up and down stairs, and lift wood to place into wood stove o The patient is interested in resources to help with cost and installation of electric heating unit . Advised the patient SW was not familiar with her county but would look into resources . Scheduled follow up call with the patient over the next week . Successful outbound call placed to Lajuana Ripple with [place] to inquire of local programs for home modifications o SW advised to contact [facility] for assistance . Outbound call placed to [facility] voice message left requesting return call  Patient Self Care Activities:  . Patient verbalizes understanding of plan to work with SW to identify resources to assist with care coordination needs . Self administers medications as prescribed . Attends all scheduled provider appointments . Performs ADL's independently . Calls provider office for new concerns or questions  Initial goal documentation     .  COMPLETED: "I would like to complete an advance directive" (pt-stated)        CARE PLAN ENTRY (see longtitudinal plan of care for additional care plan information)  Current Barriers:  . Limited education about how to name a healthcare power of attorney . Chronic Conditions including HTN and Ulcerative Colitis which put the patient at increased risk for hospitalization  Social Work Clinical Goal(s):  [name] Over the next 20 days, the patient will review mailed Advance Directive packet . Over the next 30 days, patient will verbalize basic understanding of Advanced Directives and importance of completion  CCM Interventions: Completed 04/28/20 . Successful outbound call placed to the patient to  assess goal progression . Determined the patient has received mailed advance directive packet and has no questions at this time how to complete the form . Discussed importance of providing completed copy to the provider office to have it scanned into her file  . Encouraged the patient to contact SW as needed for assistance with completion . Goal met  Completed 03/18/20 . Interviewed patient about Financial controller and provided education about the importance of completing advanced directives . Mailed the patient an Emergency planning/management officer . Advised patient to review information mailed by this SW . Scheduled follow up call to the patient over the next month to review mailed resource  Patient Self Care Activities:  . Can read and write at 6th grading reading level . Is able to readily make contact with social support system . Can identify next of kin, power or attorney, guardian, or primary caregiver . Attends all scheduled provider appointments . Performs ADLs independently . Performs IADLs independently  Please see  past updates related to this goal by clicking on the "Past Updates" button in the selected goal       .  "to figure out why I am having urinary retention" (pt-stated)        [place] (see longitudinal plan of care for additional care plan information)  Current Barriers:  [name] Knowledge Deficits related to evaluation and treatment of Impaired Urinary Elimination  . Chronic Disease Management support and education needs related to Essential hypertension, Ulcerative Colitis w/o complication, right-sided low back pain with right-sided sciatica   . Nurse Case Manager Clinical Goal(s):  [name] Over the next 90 days, patient will work with PCP to address needs related to evaluation and treatment of symptoms of Impaired Urinary Elimination including urinary retention, urinary hesitancy, and urinary urgency   CCM RN CM Interventions:  04/20/20 call completed with patient   . Inter-disciplinary care team collaboration (see longitudinal plan of care) . Evaluation of current treatment plan related to Impaired Urinary Elimination and patient's adherence to plan as established by provider . Determined patient experienced hematuria w/o other notable symptoms; Determined she went to the Urgent care in [place], [place] and was diagnosed with Pyelonephritis, a urine culture was obtained . Determined patient was prescribed Bactrim DS 800-160 mg bid x 7 days; educated on importance of completing the full course of antibiotics . 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 Discussed having the embedded Pharm D f/u with her concerning potential AE related to the Reflexis . Reviewed and discussed that patient is also taking Meloxicam and MTX as directed; Educated patient on potential SE related to these medications and when to call the doctor and or pharmacist if SE are noted  . Discussed plans with patient for ongoing care management follow up and provided patient with direct contact information for care management team  CCM SW Interventions Completed 04/28/20 . Successful outbound call placed to the patient to assist with care coordination needs . The patient reports she no longer has blood in her urine but is being referred to urologist . Advised the patient SW would collaborate with RN Care Manager to provide update on intervention . Encouraged the patient to contact RN Care Manager directly as needed for assistance in resource navigation  Patient Self Care Activities:  . Self administers medications as prescribed . Attends all scheduled provider appointments . Calls pharmacy for medication refills . Performs ADL's independently . Performs IADL's independently . Calls provider office for new concerns or  questions  Please see past updates related to this goal by clicking on the "Past Updates" button in the selected goal          Follow Up Plan: SW will follow up with patient by phone over the next week.   [name], BSW, CDP Social Worker, Certified Dementia Practitioner [place] [facility] [phone]  Total time spent performing care coordination and/or care management activities with the patient by phone or face to face = 35 minutes.

## 2020-04-30 DIAGNOSIS — M5416 Radiculopathy, lumbar region: Secondary | ICD-10-CM | POA: Diagnosis not present

## 2020-04-30 DIAGNOSIS — M5136 Other intervertebral disc degeneration, lumbar region: Secondary | ICD-10-CM | POA: Diagnosis not present

## 2020-05-01 ENCOUNTER — Telehealth: Payer: Self-pay

## 2020-05-01 ENCOUNTER — Other Ambulatory Visit: Payer: Self-pay

## 2020-05-01 ENCOUNTER — Ambulatory Visit: Payer: Self-pay

## 2020-05-01 DIAGNOSIS — I10 Essential (primary) hypertension: Secondary | ICD-10-CM

## 2020-05-01 DIAGNOSIS — K5180 Other ulcerative colitis without complications: Secondary | ICD-10-CM

## 2020-05-01 NOTE — Telephone Encounter (Signed)
  Chronic Care Management   Outreach Note  05/01/2020 Name: Gabrielle Hawkins MRN: [id number] DOB: May 10, 1966  Referred by: [name], FNP Reason for referral : Care Coordination   SW placed an unsuccessful outbound call to [facility] with [place] program in response to a voice message received. SW left voice message requesting return call.  Follow Up Plan: The care management team will reach out to the patient again over the next 7 days.   [name], BSW, CDP Social Worker, Certified Dementia Practitioner [place] [facility] [phone]

## 2020-05-01 NOTE — Chronic Care Management (AMB) (Signed)
Chronic Care Management Pharmacy  Name: Gabrielle Hawkins  MRN: [id number] DOB: November 28, 1965  Chief Complaint/ HPI  Gabrielle Hawkins,  54 y.o. , female presents for their Follow-Up CCM visit with the clinical pharmacist via telephone due to COVID-19 Pandemic.  PCP : [name], FNP  Their chronic conditions include: Hypertension and Ulcerative colitis and Insomnia  Office Visits: 03/30/20 Telephone call: Pt requesting refill for BP medication. Also complained of chronic fatigue and what tests are required to be testes for chronic fatigue syndrome.   02/26/20 OV: BP is controlled. Labs ordered (CBC w/ diff). Temazepam has been effective to help with insomnia, refill sent. Consider changing to mirtazapine to help with appetite if temazepam not effective.   Consult Visits: 04/17/20 Urgent Care OV: Presented with hematuria. Diagnosed with pyelonephritis. Urine culture obtained. Started on Bactrim DS twice daily for 7 days.   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 Order Xray of right hip.   02/12/20 Neurology OV w/ S. Slack: Continued complaints of right-sided low back pain radiating to right leg. Nerve conduction study in 03/2019 of right lower extremity was essentially normal. MRI of lumbar spine ordered. Increase gabapentin to 676m three times daily. Also taking meloxicam, tizanidine, amitriptyline, Celebrex, and tramadol. Consider physical therapy. Follow up in 3 months.  02/04/20 Urgent Care OV: Back pain. Xray of lumbar spine showed moderate multilevel degenerative disk disease. No fracture or lesions. Lower lumbar facet arthropathy is noted. Lumbar scoliosis present.   01/30/20: Renflexis infusion for ulcerative colitis  01/02/20: Renflexis infusion for ulcerative colitis  12/09/19 Urgent Care OV: Urinalysis/urine culture abnormal. Urinary tract  infection. Started Keflex 5076mfour times daily for 7 days.Abdominal Xray for left flank pain showed large amount of fecal retention throughout colon. No urinary tract calculi or stones.   11/06/19: Renflexis infusion for ulcerative colitis  10/09/19: Renflexis infusion for ulcerative colitis  CCM Encounters: 03/18/20 SW: Mailed pt advanced directive packet.   02/25/20 RN: Established care plan. Referred to PharmD for medication assistance.  Medications: Outpatient Encounter Medications as of 05/01/2020  Medication Sig  . albuterol (VENTOLIN HFA) 108 (90 Base) MCG/ACT inhaler Inhale 2 puffs into the lungs every 6 (six) hours as needed for wheezing or shortness of breath.  . Marland Kitchenmitriptyline (ELAVIL) 25 MG tablet Take 25 mg by mouth at bedtime.   . beclomethasone (QVAR) 80 MCG/ACT inhaler Inhale 1 puff into the lungs daily as needed (Shortness of Breath). (Patient not taking: Reported on 04/10/2020)  . celecoxib (CELEBREX) 200 MG capsule Take 200 mg by mouth 2 (two) times daily.   . cetirizine (ZYRTEC) 10 MG tablet Take 10 mg by mouth daily.  . Marland Kitchenicyclomine (BENTYL) 20 MG tablet Take 20 mg by mouth 4 (four) times daily -  before meals and at bedtime. Up to four times daily as needed  . diphenhydrAMINE (BENADRYL) 25 mg capsule Take 25 mg by mouth daily as needed for itching. (Patient not taking: Reported on 04/10/2020)  . folic acid (FOLVITE) 1 MG tablet Take 3 mg by mouth daily.   . Marland Kitchenabapentin (NEURONTIN) 300 MG capsule Take 2 capsules (600 mg total) by mouth 3 (three) times daily.  . Marland KitchennFLIXimab (REMICADE) 100 MG injection Inject 100 mg into the vein every 30 (thirty) days. Infuse by intravenous route every 4 weeks  Patient is receiving Inflectra brand verses Remicade brand due to insurance (Patient not taking:  Reported on 04/10/2020)  . inFLIXimab in sodium chloride 0.9 % Inject 10 mg/kg into the vein every 30 (thirty) days. Infuse every 4 weeks  . loperamide (IMODIUM) 2 MG capsule Take 1 capsule (2 mg  total) by mouth 4 (four) times daily as needed for diarrhea or loose stools.  . meloxicam (MOBIC) 15 MG tablet Take 15 mg by mouth daily. (Patient not taking: Reported on 04/10/2020)  . methotrexate (50 MG/ML) 1 g injection Inject into the vein every Monday. 0.377m every Monday  . olmesartan (BENICAR) 20 MG tablet Take 1 tablet by mouth once daily  . ondansetron (ZOFRAN) 4 MG tablet Take 4 mg by mouth every 6 (six) hours as needed for nausea or vomiting.  . temazepam (RESTORIL) 7.5 MG capsule Take 1 capsule (7.5 mg total) by mouth at bedtime as needed for sleep.  .[name] (ZANAFLEX) 2 MG tablet Take 2 mg by mouth 3 (three) times daily.  . traMADol (ULTRAM) 50 MG tablet Take 50 mg by mouth every 6 (six) hours as needed for moderate pain.    No facility-administered encounter medications on file as of 05/01/2020.    Current Diagnosis/Assessment:  SDOH Interventions     Most Recent Value  SDOH Interventions  Financial Strain Interventions Other (Comment)  [Will assist with patient assistance for Remicade if needed]      Goals Addressed            This Visit's Progress   . Pharmacy Care Plan       CARE PLAN ENTRY (see longitudinal plan of care for additional care plan information)  Current Barriers:  . Chronic Disease Management support, education, and care coordination needs related to Hypertension and Ulcerative colitis   Hypertension BP Readings from Last 3 Encounters:  02/26/20 (!) 132/94  02/12/20 138/87  06/04/19 120/60   . Pharmacist Clinical Goal(s): o Over the next 180 days, patient will work with PharmD and providers to maintain BP goal <140/90 . Current regimen:  o Olmesartan 2477mdaily . Interventions: o Provided dietary and exercise recommendations o Recommend patient check blood pressure at different times during the day . Patient self care activities - Over the next 180 days, patient will: o Check BP twice weekly, document, and provide at future  appointments o Ensure daily salt intake < 2300 mg/day o Increase exercise to 30 minutes daily 5 times per week  Ulcerative colitis . Pharmacist Clinical Goal(s) o Over the next 180 days, patient will work with PharmD and providers to control symptoms of ulcerative colitis . Current regimen:   Methotrexate 1g injection every week  Folic acid 77m78maily  Infliximab (Renflexis) 100m67mfusion every 4 weeks . Interventions: o Contacted [employer] Remicade patient assistance program- Notified patient of denial from [id number] new application for Remicade patient assistance program was sent to the office. Will assist patient with completing application - Patient states that Dr. HawkTrudie Reedice is checking into Remicade patient assistance for her . Patient self care activities - Over the next 90 days, patient will: o Contact PharmD if need help applying for Remicade patient assistance   Insomnia . Pharmacist Clinical Goal(s) o Over the next 90 days, patient will work with PharmD and providers to improve sleep . Current regimen:   Temazepam 7.5mg 61mbedtime as needed for sleep  Amitriptyline 25mg 76medtime . Interventions: o Patient referred to therapist, awaiting scheduling o Provided patient education documents regarding sleep hygiene . Patient self care  activities - Over the next 90 days, patient will: o Practice good sleep hygiene  o Schedule appointment with therapist  Medication management . Pharmacist Clinical Goal(s): o Over the next 90 days, patient will work with PharmD and providers to achieve optimal medication adherence . Current pharmacy: [facility] . Interventions o Comprehensive medication review performed. o Utilize [facility] pharmacy for medication synchronization, packaging and delivery o Recommend OTC Voltaren gel for arthritis pain . Patient self care activities - Over the next 90 days, patient will: o Focus on  medication adherence by medication synchronization and adherence packaging o Take medications as prescribed o Report any questions or concerns to PharmD and/or provider(s)  Please see past updates related to this goal by clicking on the "Past Updates" button in the selected goal        Hypertension   BP goal is:  <140/90  Office blood pressures are  BP Readings from Last 3 Encounters:  02/26/20 (!) 132/94  02/12/20 138/87  06/04/19 120/60   Patient checks BP at home 1-2x per week Patient home BP readings are ranging:   Patient has failed these meds in the past: Losartan Patient is currently controlled on the following medications:  . Olmesartan 44m daily  We discussed:  Advised pt to alternate times of the day when she is checking her BP Exercise extensively Recommend 30 minutes of exercise 5 times weekly  Plan Continue current medications   Ulcerative Colitis   Patient has failed these meds in past: Prednisone, Humira, XMorrie Sheldon mesalamine Patient is currently uncontrolled on the following medications:   Methotrexate 1g injection every week  Folic acid 365mdaily  Infliximab (Renflexis) 10073mnfusion every 4 weeks  We discussed:   Pt had an appointment with Dr. HawTrudie Reed18 and discussed possibility of switching back to Remicade  Doctor's office is supposed to be looking into this for patient  Advised pt that I could help with patient assistance application for Remicade if needed  Discussed with pt that I spoke with JohWynetta Emeryd JohWynetta Emerytient assistance foundation representative, [place] 04/10/20 who states that patient was denied patient assistance in September 2018 due to not meeting out of pocket spending requirement Plan Continue current medications  Assist patient with completing patient assistance application for Remicade if needed  Insomnia   Patient has failed these meds in past: Alprazolam, zolpidem Patient is currently uncontrolled on the  following medications:   Temazepam 7.5mg51m bedtime as needed for sleep  Amitriptyline 25mg60mbedtime  We discussed:    Pt has been referred to therapist by PCP, awaiting scheduling  Pt received amitriptyline from local pharmacy this month (sent there instead of [facility]). Will get from [facility] next month  Plan Continue current medications   Neuropathy   Patient has failed these meds in past: Lyrica Patient is currently controlled on the following medications:   Gabapentin 300mg 45mpsules 3 times daily  We discussed:  She states that her doctor told her she could take 4 capsules at bedtime, instead of throughout the day  Plan Continue current medications  Contact prescribing doctor regarding dosing clarification for gabapentin  Pain   Patient has failed these meds in past: Hydrocodone/APAP, meloxicam, morphine, naproxen Patient is currently controlled on the following medications:   Celecoxib 200mg t55m daily  Tizanidine 2mg thr6mtimes daily as needed  Tramadol 50mg eve50m hours as needed for moderate pain  We discussed:  Pt states that Celebrex is hurting stomach  Recommend pt try Voltaren  gel for arthritis pain, available over the counter   Plan Continue current medications   Irritable Bowel Syndrome   Patient has failed these meds in past: Hyoscyamine, Viberzi Patient is currently controlled on the following medications:   Dicyclomine 71m at bedtime  We discussed:    Pt states that she takes dicyclomine up to four times daily as needed  Plan Recommend discontinuing dicyclomine due to ulcerative colitis and reports of diarrhea  Diarrhea   Patient has failed these meds in past: N/A Patient is currently controlled on the following medications:   Loperamide 230m4 times daily as needed for loose stools or diarrhea  Plan Continue current medications   Health Maintenance   Patient is currently on the following medications:  . Cetirizine  1043maily . Acetaminophen 500m40mOndansetron 4mg 32mry 6 hours as needed for nausea or vomiting . Ventolin HFA as needed for shortness of breath/coughing during asthma flare   Plan Continue current medications   Vaccines   Reviewed and discussed patient's vaccination history.  No [facility] records  Immunization History  Administered Date(s) Administered  . Influenza,inj,Quad PF,6+ Mos 06/04/2019  . Influenza-Unspecified 08/09/2018  . Moderna SARS-COVID-2 Vaccination 11/13/2019, 12/11/2019   Pt received Tdap vaccine 05/2019 at local pharmacy  Plan No current recommendations at this time  Med[facility]   [facility] Uses [facility]? No - Unknown Pt endorses 99% compliance  We discussed:  . Importance of taking each medications daily as directed . Medication synchronization, adherence packaging, and delivery available with [facility] . Pt states that her mother is interested in using [facility] pharmacy, will coordinate  Plan Utilize [facility] pharmacy for medication synchronization, packaging and delivery  Follow up: 2 month phone visit  CourtJannette FogormD Clinical Pharmacist [facility] [phone]

## 2020-05-04 ENCOUNTER — Telehealth: Payer: Self-pay

## 2020-05-04 NOTE — Chronic Care Management (AMB) (Signed)
Chronic Care Management Pharmacy Assistant   Name: Gabrielle Hawkins  MRN: [id number] DOB: 28-Sep-1965  Reason for Encounter: Care Coordination with Outside Provider  PCP : [name], FNP  Allergies:   Allergies  Allergen Reactions   Humira [Adalimumab] Anaphylaxis   Ampicillin Hives and Itching   Codeine    Compazine [Prochlorperazine Maleate] Other (See Comments)    "Stroke-like symptoms" Can tolerate Phenergan.   Cymbalta [Duloxetine Hcl] Other (See Comments)    Fainting   Imitrex [Sumatriptan Base] Hives and Nausea And Vomiting   Lyrica [Pregabalin] Other (See Comments)    Fainting   Metoclopramide Hcl Hives   Percocet [Oxycodone-Acetaminophen] Itching   Effexor [Venlafaxine Hydrochloride] Hives and Palpitations   Tape Itching and Rash    Medications: Outpatient Encounter Medications as of 05/04/2020  Medication Sig   albuterol (VENTOLIN HFA) 108 (90 Base) MCG/ACT inhaler Inhale 2 puffs into the lungs every 6 (six) hours as needed for wheezing or shortness of breath.   amitriptyline (ELAVIL) 25 MG tablet Take 25 mg by mouth at bedtime.    beclomethasone (QVAR) 80 MCG/ACT inhaler Inhale 1 puff into the lungs daily as needed (Shortness of Breath). (Patient not taking: Reported on 04/10/2020)   celecoxib (CELEBREX) 200 MG capsule Take 200 mg by mouth 2 (two) times daily.    cetirizine (ZYRTEC) 10 MG tablet Take 10 mg by mouth daily.   dicyclomine (BENTYL) 20 MG tablet Take 20 mg by mouth 4 (four) times daily -  before meals and at bedtime. Up to four times daily as needed   diphenhydrAMINE (BENADRYL) 25 mg capsule Take 25 mg by mouth daily as needed for itching. (Patient not taking: Reported on 05/08/2354)   folic acid (FOLVITE) 1 MG tablet Take 3 mg by mouth daily.    gabapentin (NEURONTIN) 300 MG capsule Take 2 capsules (600 mg total) by mouth 3 (three) times daily.   inFLIXimab (REMICADE) 100 MG injection Inject 100 mg into the vein every 30  (thirty) days. Infuse by intravenous route every 4 weeks  Patient is receiving Inflectra brand verses Remicade brand due to insurance (Patient not taking: Reported on 04/10/2020)   inFLIXimab in sodium chloride 0.9 % Inject 10 mg/kg into the vein every 30 (thirty) days. Infuse every 4 weeks   loperamide (IMODIUM) 2 MG capsule Take 1 capsule (2 mg total) by mouth 4 (four) times daily as needed for diarrhea or loose stools.   meloxicam (MOBIC) 15 MG tablet Take 15 mg by mouth daily. (Patient not taking: Reported on 04/10/2020)   methotrexate (50 MG/ML) 1 g injection Inject into the vein every Monday. 0.27m every Monday   olmesartan (BENICAR) 20 MG tablet Take 1 tablet by mouth once daily   ondansetron (ZOFRAN) 4 MG tablet Take 4 mg by mouth every 6 (six) hours as needed for nausea or vomiting.   temazepam (RESTORIL) 7.5 MG capsule Take 1 capsule (7.5 mg total) by mouth at bedtime as needed for sleep.   tiZANidine (ZANAFLEX) 2 MG tablet Take 2 mg by mouth 3 (three) times daily.   traMADol (ULTRAM) 50 MG tablet Take 50 mg by mouth every 6 (six) hours as needed for moderate pain.    No facility-administered encounter medications on file as of 05/04/2020.    Current Diagnosis: Patient Active Problem List   Diagnosis Date Noted   Right-sided low back pain with right-sided sciatica 05/06/2019   Right sided numbness 03/27/2019   Paresthesia 12/25/2018   Essential hypertension 06/26/2018  Abdominal pain, chronic, right lower quadrant 01/06/2015   Nausea and vomiting 01/05/2015   Ulcerative colitis ([place]) 01/05/2015   Enteritis    Nausea with vomiting    Intractable nausea and vomiting 04/29/2014   Benign paroxysmal positional vertigo 03/14/2013   Lymphedema of arm 12/30/2011     Follow-Up:  Care Coordination with Outside Provider- [facility]- Dr Ulyses Amor office, spoke with Varney Biles- Dr Benson [place] nurse, requested Amitriptyline to be sent to [facility], last  request was sent to [facility]. Varney Biles was able to send this to [employer], [employer], CPP notified.   [name], [place] Pharmacist Assistant [phone]

## 2020-05-05 ENCOUNTER — Ambulatory Visit: Payer: Medicare HMO

## 2020-05-05 DIAGNOSIS — K5180 Other ulcerative colitis without complications: Secondary | ICD-10-CM

## 2020-05-05 DIAGNOSIS — I10 Essential (primary) hypertension: Secondary | ICD-10-CM

## 2020-05-05 NOTE — Chronic Care Management (AMB) (Signed)
Chronic Care Management    Social Work Follow Up Note  05/05/2020 Name: Gabrielle Hawkins MRN: [id number] DOB: [birth year]  Gabrielle Hawkins is a 54 y.o. year old female who is a primary care patient of [name], [place]. The CCM team was consulted for assistance with care coordination.   Review of patient status, including review of consultants reports, other relevant assessments, and collaboration with appropriate care team members and the patient's provider was performed as part of comprehensive patient evaluation and provision of chronic care management services.    SDOH (Social Determinants of Health) assessments performed: No    Outpatient Encounter Medications as of 05/05/2020  Medication Sig  . albuterol (VENTOLIN HFA) 108 (90 Base) MCG/ACT inhaler Inhale 2 puffs into the lungs every 6 (six) hours as needed for wheezing or shortness of breath.  [name] amitriptyline (ELAVIL) 25 MG tablet Take 25 mg by mouth at bedtime.   . beclomethasone (QVAR) 80 MCG/ACT inhaler Inhale 1 puff into the lungs daily as needed (Shortness of Breath). (Patient not taking: Reported on 04/10/2020)  . celecoxib (CELEBREX) 200 MG capsule Take 200 mg by mouth 2 (two) times daily.   . cetirizine (ZYRTEC) 10 MG tablet Take 10 mg by mouth daily.  [name] dicyclomine (BENTYL) 20 MG tablet Take 20 mg by mouth 4 (four) times daily -  before meals and at bedtime. Up to four times daily as needed  . diphenhydrAMINE (BENADRYL) 25 mg capsule Take 25 mg by mouth daily as needed for itching. (Patient not taking: Reported on 04/10/2020)  . folic acid (FOLVITE) 1 MG tablet Take 3 mg by mouth daily.   [name] gabapentin (NEURONTIN) 300 MG capsule Take 2 capsules (600 mg total) by mouth 3 (three) times daily.  [name] inFLIXimab (REMICADE) 100 MG injection Inject 100 mg into the vein every 30 (thirty) days. Infuse by intravenous route every 4 weeks  Patient is receiving Inflectra brand verses Remicade brand due to insurance (Patient not taking:  Reported on 04/10/2020)  . inFLIXimab in sodium chloride 0.9 % Inject 10 mg/kg into the vein every 30 (thirty) days. Infuse every 4 weeks  . loperamide (IMODIUM) 2 MG capsule Take 1 capsule (2 mg total) by mouth 4 (four) times daily as needed for diarrhea or loose stools.  . meloxicam (MOBIC) 15 MG tablet Take 15 mg by mouth daily. (Patient not taking: Reported on 04/10/2020)  . methotrexate (50 MG/ML) 1 g injection Inject into the vein every Monday. 0.85m every Monday  . olmesartan (BENICAR) 20 MG tablet Take 1 tablet by mouth once daily  . ondansetron (ZOFRAN) 4 MG tablet Take 4 mg by mouth every 6 (six) hours as needed for nausea or vomiting.  . temazepam (RESTORIL) 7.5 MG capsule Take 1 capsule (7.5 mg total) by mouth at bedtime as needed for sleep.  .[name] (ZANAFLEX) 2 MG tablet Take 2 mg by mouth 3 (three) times daily.  . traMADol (ULTRAM) 50 MG tablet Take 50 mg by mouth every 6 (six) hours as needed for moderate pain.    No facility-administered encounter medications on file as of 05/05/2020.     Goals Addressed              This Visit's Progress     Patient Stated   .  "I got a letter they are denying me for disability" (pt-stated)        CARE PLAN ENTRY (see longitudinal plan of care for additional care plan information)  Current Barriers:  .  Chronic conditions including HTN, Ulcerative Colitis, and Crohn's disease which impact patients ability to work . Unknown reason for decision to decline further disability payment after case review  Social Work Clinical Goal(s):  [name] Over the next 5 days the patient will work with a Media planner to initiate appeal process  CCM SW Interventions: Completed 05/05/20 . Inter-disciplinary care team collaboration (see longitudinal plan of care) . Successful outbound call placed to the patient to assist with care coordination needs . Determined the patient received a letter stating her case review resulted in a denial of disability  benefits . Discussed patient opportunity to appeal the denial o Patient reported she has tried to contact Disability office x 2 with both calls being cut off while holding for assistance . Determined the patient has contacted a lawyer office and spoken to the [place] regarding denial o Patient has provided a copy of the disability denial letter and is awaiting a return call from the lawyer . Advised the patient to contact the lawyer again if she does not receive a return call in the next 24 hours . Encouraged the patient for the steps she has taken to advocate for herself . Scheduled a follow up call for 05/06/20 to assess goal progression . Collaboration with RN Care Manager to inform of goal and plan to assist with care coordination surrounding disability denial  Patient Self Care Activities:  . Patient verbalizes understanding of plan to follow up with lawyer regarding appeal . Self administers medications as prescribed . Attends all scheduled provider appointments . Calls pharmacy for medication refills . Performs ADL's independently  Initial goal documentation     .  "I need help with roof repairs and heating" (pt-stated)        CARE PLAN ENTRY (see longitudinal plan of care for additional care plan information)  Current Barriers:  . Financial constraints related to cost of home modification needs . Limited knowledge of community resources to assist with home modifications . Chronic conditions including HTN and ulcerative colitis which impact patient ability to perform iADL's in the home   Social Work Clinical Goal(s):  [name] Over the next 30 days the patient will work with SW to identify resources to assist with home modifications  CCM SW Interventions: Completed 05/05/20 . Successful outbound call placed to the patient to discuss resource options . Advised the patient of the Home Repair program under [facility] ([place] to assist with roof repair needs and  heating costs o Discussed this program is for homeowners over the age of 28 or persons on disability o Patient reported the home is in her mothers name but is a family home . Provided the patient with contact number to [facility] to determine if eligible for program . Scheduled follow up call over the next two weeks to assess outcome of screening call  Completed 04/28/20 . Inter-disciplinary care team collaboration (see longitudinal plan of care) . Successful outbound call placed to the patient to assist with care coordination needs . Determined the patient is in need of home modification resources o The patient reports she has a leaky roof and is worried she may have mold in her home from the leaks o The patient indicates she heats her home with a gas stove located in the basement. It is difficult for patient to locate wood, go up and down stairs, and lift wood to place into wood stove o The patient is interested in resources to help with cost and installation  of electric heating unit . Advised the patient SW was not familiar with her county but would look into resources . Scheduled follow up call with the patient over the next week . Successful outbound call placed to Lajuana Ripple with [place] to inquire of local programs for home modifications o SW advised to contact [place] ([place]) for assistance . Outbound call placed to [facility] voice message left requesting return call  Patient Self Care Activities:  . Patient verbalizes understanding of plan to work with SW to identify resources to assist with care coordination needs . Self administers medications as prescribed . Attends all scheduled provider appointments . Performs ADL's independently . Calls provider office for new concerns or questions  Please see past updates related to this goal by clicking on the "Past Updates" button in the selected goal          Follow Up Plan: Appointment scheduled for SW  follow up with client by phone on: 05/06/20   [name], [place], CDP Social Worker, Certified Dementia Practitioner [place] [facility] [place] [facility] [phone]  Total time spent performing care coordination and/or care management activities with the patient by phone or face to face = 20 minutes.

## 2020-05-05 NOTE — Patient Instructions (Addendum)
Visit Information  Goals Addressed            This Visit's Progress   . Pharmacy Care Plan       CARE PLAN ENTRY (see longitudinal plan of care for additional care plan information)  Current Barriers:  . Chronic Disease Management support, education, and care coordination needs related to Hypertension and Ulcerative colitis   Hypertension BP Readings from Last 3 Encounters:  02/26/20 (!) 132/94  02/12/20 138/87  06/04/19 120/60   . Pharmacist Clinical Goal(s): o Over the next 180 days, patient will work with PharmD and providers to maintain BP goal <140/90 . Current regimen:  o Olmesartan 60m daily . Interventions: o Provided dietary and exercise recommendations o Recommend patient check blood pressure at different times during the day . Patient self care activities - Over the next 180 days, patient will: o Check BP twice weekly, document, and provide at future appointments o Ensure daily salt intake < 2300 mg/day o Increase exercise to 30 minutes daily 5 times per week  Ulcerative colitis . Pharmacist Clinical Goal(s) o Over the next 180 days, patient will work with PharmD and providers to control symptoms of ulcerative colitis . Current regimen:   Methotrexate 1g injection every week  Folic acid 337mdaily  Infliximab (Renflexis) 10035mnfusion every 4 weeks . Interventions: o Contacted [employer] Remicade patient assistance program- Notified patient of denial from [id number] new application for Remicade patient assistance program was sent to the office. Will assist patient with completing application - Patient states that Dr. HawTrudie Reedfice is checking into Remicade patient assistance for her . Patient self care activities - Over the next 90 days, patient will: o Contact PharmD if need help applying for Remicade patient assistance   Insomnia . Pharmacist Clinical Goal(s) o Over the next 90 days, patient will work  with PharmD and providers to improve sleep . Current regimen:   Temazepam 7.5mg46m bedtime as needed for sleep  Amitriptyline 25mg11mbedtime . Interventions: o Patient referred to therapist, awaiting scheduling o Provided patient education documents regarding sleep hygiene . Patient self care activities - Over the next 90 days, patient will: o Practice good sleep hygiene  o Schedule appointment with therapist  Medication management . Pharmacist Clinical Goal(s): o Over the next 90 days, patient will work with PharmD and providers to achieve optimal medication adherence . Current pharmacy: [facility] . Interventions o Comprehensive medication review performed. o Utilize [facility] for medication synchronization, packaging and delivery o Recommend OTC Voltaren gel for arthritis pain . Patient self care activities - Over the next 90 days, patient will: o Focus on medication adherence by medication synchronization and adherence packaging o Take medications as prescribed o Report any questions or concerns to PharmD and/or provider(s)  Please see past updates related to this goal by clicking on the "Past Updates" button in the selected goal         The patient verbalized understanding of instructions provided today and agreed to receive a mailed copy of patient instruction and/or educational materials.  Telephone follow up appointment with pharmacy team member scheduled for:05/29/20 @ 3:00 PM  CourtJannette FogormD Clinical Pharmacist [facility] [phone] Eating Plan DASH stands for "Dietary Approaches to Stop Hypertension." The DASH eating plan is a healthy eating plan that has been shown to reduce high blood pressure (hypertension). It may also reduce your risk for type 2 diabetes, heart  disease, and stroke. The DASH eating plan may also help with weight loss. What are tips for following this plan?  General guidelines  Avoid eating  more than 2,300 mg (milligrams) of salt (sodium) a day. If you have hypertension, you may need to reduce your sodium intake to 1,500 mg a day.  Limit alcohol intake to no more than 1 drink a day for nonpregnant women and 2 drinks a day for men. One drink equals 12 oz of beer, 5 oz of wine, or 1 oz of hard liquor.  Work with your health care provider to maintain a healthy body weight or to lose weight. Ask what an ideal weight is for you.  Get at least 30 minutes of exercise that causes your heart to beat faster (aerobic exercise) most days of the week. Activities may include walking, swimming, or biking.  Work with your health care provider or diet and nutrition specialist (dietitian) to adjust your eating plan to your individual calorie needs. Reading food labels   Check food labels for the amount of sodium per serving. Choose foods with less than 5 percent of the Daily Value of sodium. Generally, foods with less than 300 mg of sodium per serving fit into this eating plan.  To find whole grains, look for the word "whole" as the first word in the ingredient list. Shopping  Buy products labeled as "low-sodium" or "no salt added."  Buy fresh foods. Avoid canned foods and premade or frozen meals. Cooking  Avoid adding salt when cooking. Use salt-free seasonings or herbs instead of table salt or sea salt. Check with your health care provider or pharmacist before using salt substitutes.  Do not fry foods. Cook foods using healthy methods such as baking, boiling, grilling, and broiling instead.  Cook with heart-healthy oils, such as olive, canola, soybean, or sunflower oil. Meal planning  Eat a balanced diet that includes: ? 5 or more servings of fruits and vegetables each day. At each meal, try to fill half of your plate with fruits and vegetables. ? Up to 6-8 servings of whole grains each day. ? Less than 6 oz of lean meat, poultry, or fish each day. A 3-oz serving of meat is about the  same size as a deck of cards. One egg equals 1 oz. ? 2 servings of low-fat dairy each day. ? A serving of nuts, seeds, or beans 5 times each week. ? Heart-healthy fats. Healthy fats called Omega-3 fatty acids are found in foods such as flaxseeds and coldwater fish, like sardines, salmon, and mackerel.  Limit how much you eat of the following: ? Canned or prepackaged foods. ? Food that is high in trans fat, such as fried foods. ? Food that is high in saturated fat, such as fatty meat. ? Sweets, desserts, sugary drinks, and other foods with added sugar. ? Full-fat dairy products.  Do not salt foods before eating.  Try to eat at least 2 vegetarian meals each week.  Eat more home-cooked food and less restaurant, buffet, and fast food.  When eating at a restaurant, ask that your food be prepared with less salt or no salt, if possible. What foods are recommended? The items listed may not be a complete list. Talk with your dietitian about what dietary choices are best for you. Grains Whole-grain or whole-wheat bread. Whole-grain or whole-wheat pasta. Brown rice. [place]. Bulgur. Whole-grain and low-sodium cereals. Pita bread. Low-fat, low-sodium crackers. Whole-wheat flour tortillas. Vegetables Fresh or frozen vegetables (raw, steamed,  roasted, or grilled). Low-sodium or reduced-sodium tomato and vegetable juice. Low-sodium or reduced-sodium tomato sauce and tomato paste. Low-sodium or reduced-sodium canned vegetables. Fruits All fresh, dried, or frozen fruit. Canned fruit in natural juice (without added sugar). Meat and other protein foods Skinless chicken or [place]. Ground chicken or [place]. Pork with fat trimmed off. Fish and seafood. Egg whites. Dried beans, peas, or lentils. Unsalted nuts, nut butters, and seeds. Unsalted canned beans. Lean cuts of beef with fat trimmed off. Low-sodium, lean deli meat. Dairy Low-fat (1%) or fat-free (skim) milk. Fat-free, low-fat, or reduced-fat  cheeses. Nonfat, low-sodium ricotta or cottage cheese. Low-fat or nonfat yogurt. Low-fat, low-sodium cheese. Fats and oils Soft margarine without trans fats. Vegetable oil. Low-fat, reduced-fat, or light mayonnaise and salad dressings (reduced-sodium). Canola, safflower, olive, soybean, and sunflower oils. Avocado. Seasoning and other foods Herbs. Spices. Seasoning mixes without salt. Unsalted popcorn and pretzels. Fat-free sweets. What foods are not recommended? The items listed may not be a complete list. Talk with your dietitian about what dietary choices are best for you. Grains Baked goods made with fat, such as croissants, muffins, or some breads. Dry pasta or rice meal packs. Vegetables Creamed or fried vegetables. Vegetables in a cheese sauce. Regular canned vegetables (not low-sodium or reduced-sodium). Regular canned tomato sauce and paste (not low-sodium or reduced-sodium). Regular tomato and vegetable juice (not low-sodium or reduced-sodium). Angie Fava. Olives. Fruits Canned fruit in a light or heavy syrup. Fried fruit. Fruit in cream or butter sauce. Meat and other protein foods Fatty cuts of meat. Ribs. Fried meat. Berniece Salines. Sausage. Bologna and other processed lunch meats. Salami. Fatback. Hotdogs. Bratwurst. Salted nuts and seeds. Canned beans with added salt. Canned or smoked fish. Whole eggs or egg yolks. Chicken or [place] with skin. Dairy Whole or 2% milk, cream, and half-and-half. Whole or full-fat cream cheese. Whole-fat or sweetened yogurt. Full-fat cheese. Nondairy creamers. Whipped toppings. Processed cheese and cheese spreads. Fats and oils Butter. Stick margarine. Lard. Shortening. Ghee. Bacon fat. Tropical oils, such as coconut, palm kernel, or palm oil. Seasoning and other foods Salted popcorn and pretzels. Onion salt, garlic salt, seasoned salt, table salt, and sea salt. Worcestershire sauce. Tartar sauce. Barbecue sauce. Teriyaki sauce. Soy sauce, including  reduced-sodium. Steak sauce. Canned and packaged gravies. Fish sauce. Oyster sauce. Cocktail sauce. Horseradish that you find on the shelf. Ketchup. Mustard. Meat flavorings and tenderizers. Bouillon cubes. Hot sauce and Tabasco sauce. Premade or packaged marinades. Premade or packaged taco seasonings. Relishes. Regular salad dressings. Where to find more information:  National Heart, Lung, and [place]: https://wilson-eaton.com/  American Heart Association: www.heart.org Summary  The DASH eating plan is a healthy eating plan that has been shown to reduce high blood pressure (hypertension). It may also reduce your risk for type 2 diabetes, heart disease, and stroke.  With the DASH eating plan, you should limit salt (sodium) intake to 2,300 mg a day. If you have hypertension, you may need to reduce your sodium intake to 1,500 mg a day.  When on the DASH eating plan, aim to eat more fresh fruits and vegetables, whole grains, lean proteins, low-fat dairy, and heart-healthy fats.  Work with your health care provider or diet and nutrition specialist (dietitian) to adjust your eating plan to your individual calorie needs. This information is not intended to replace advice given to you by your health care provider. Make sure you discuss any questions you have with your health care provider. Document Revised: 08/04/2017 Document Reviewed: 08/15/2016 Elsevier Patient  Education  [employer].

## 2020-05-05 NOTE — Patient Instructions (Signed)
Social Worker Visit Information  Goals we discussed today:  Goals Addressed              This Visit's Progress     Patient Stated   .  "I got a letter they are denying me for disability" (pt-stated)        CARE PLAN ENTRY (see longitudinal plan of care for additional care plan information)  Current Barriers:  . Chronic conditions including HTN, Ulcerative Colitis, and Crohn's disease which impact patients ability to work . Unknown reason for decision to decline further disability payment after case review  Social Work Clinical Goal(s):  [name] Over the next 5 days the patient will work with a Media planner to initiate appeal process  CCM SW Interventions: Completed 05/05/20 . Inter-disciplinary care team collaboration (see longitudinal plan of care) . Successful outbound call placed to the patient to assist with care coordination needs . Determined the patient received a letter stating her case review resulted in a denial of disability benefits . Discussed patient opportunity to appeal the denial o Patient reported she has tried to contact Disability office x 2 with both calls being cut off while holding for assistance . Determined the patient has contacted a lawyer office and spoken to the [place] regarding denial o Patient has provided a copy of the disability denial letter and is awaiting a return call from the lawyer . Advised the patient to contact the lawyer again if she does not receive a return call in the next 24 hours . Encouraged the patient for the steps she has taken to advocate for herself . Scheduled a follow up call for 05/06/20 to assess goal progression . Collaboration with RN Care Manager to inform of goal and plan to assist with care coordination surrounding disability denial  Patient Self Care Activities:  . Patient verbalizes understanding of plan to follow up with lawyer regarding appeal . Self administers medications as prescribed . Attends all scheduled  provider appointments . Calls pharmacy for medication refills . Performs ADL's independently  Initial goal documentation     .  "I need help with roof repairs and heating" (pt-stated)        CARE PLAN ENTRY (see longitudinal plan of care for additional care plan information)  Current Barriers:  . Financial constraints related to cost of home modification needs . Limited knowledge of community resources to assist with home modifications . Chronic conditions including HTN and ulcerative colitis which impact patient ability to perform iADL's in the home   Social Work Clinical Goal(s):  [name] Over the next 30 days the patient will work with SW to identify resources to assist with home modifications  CCM SW Interventions: Completed 05/05/20 . Successful outbound call placed to the patient to discuss resource options . Advised the patient of the Home Repair program under [facility] ([place]) to assist with roof repair needs and heating costs o Discussed this program is for homeowners over the age of 38 or persons on disability o Patient reported the home is in her mothers name but is a family home . Provided the patient with contact number to [facility] to determine if eligible for program . Scheduled follow up call over the next two weeks to assess outcome of screening call  Completed 04/28/20 . Inter-disciplinary care team collaboration (see longitudinal plan of care) . Successful outbound call placed to the patient to assist with care coordination needs . Determined the patient is in need of home modification  resources o The patient reports she has a leaky roof and is worried she may have mold in her home from the leaks o The patient indicates she heats her home with a gas stove located in the basement. It is difficult for patient to locate wood, go up and down stairs, and lift wood to place into wood stove o The patient is interested in resources to help with cost and  installation of electric heating unit . Advised the patient SW was not familiar with her county but would look into resources . Scheduled follow up call with the patient over the next week . Successful outbound call placed to Lajuana Ripple with [place] to inquire of local programs for home modifications o SW advised to contact [place] ([place] for assistance . Outbound call placed to [facility] voice message left requesting return call  Patient Self Care Activities:  . Patient verbalizes understanding of plan to work with SW to identify resources to assist with care coordination needs . Self administers medications as prescribed . Attends all scheduled provider appointments . Performs ADL's independently . Calls provider office for new concerns or questions  Please see past updates related to this goal by clicking on the "Past Updates" button in the selected goal          Follow Up Plan: Appointment scheduled for SW follow up with client by phone on: 05/06/20   Gabrielle Hawkins, [place], CDP Social Worker, Certified Dementia Practitioner [place] [facility] [place] [facility] [phone]

## 2020-05-06 ENCOUNTER — Ambulatory Visit: Payer: Medicare HMO

## 2020-05-06 DIAGNOSIS — I10 Essential (primary) hypertension: Secondary | ICD-10-CM

## 2020-05-06 DIAGNOSIS — K5180 Other ulcerative colitis without complications: Secondary | ICD-10-CM

## 2020-05-06 NOTE — Progress Notes (Signed)
Should you send this to [facility]? I don't mind signing. Are you able to tell if I have signed something for her in the past? I think it causes billing issues if both of [place] sign for same patient .

## 2020-05-06 NOTE — Patient Instructions (Signed)
Social Worker Visit Information  Goals we discussed today:  Goals Addressed              This Visit's Progress     Patient Stated   .  "I got a letter they are denying me for disability" (pt-stated)        CARE PLAN ENTRY (see longitudinal plan of care for additional care plan information)  Current Barriers:  . Chronic conditions including HTN, Ulcerative Colitis, and Crohn's disease which impact patients ability to work . Unknown reason for decision to decline further disability payment after case review  Social Work Clinical Goal(s):  [name] Over the next 5 days the patient will work with a Media planner to initiate appeal process Goal Met . New 05/06/20 Over the next 60 days the patient will work with [place] to complete appeals process regarding Disability review   CCM SW Interventions: Completed 05/06/20 . Successful outbound call placed to the patient to assess goal progression . Determined the patient was contacted by the lawyer who informed the patient she is unable to assist with this part of the process . Discussed the patients letter stated they reviewer "feels the patient has improved enough to do light work" o Patient reports she spoke with Dr. Benson Norway who wrote a more detailed letter entailing why he would not release the patient to return to work . Determined  the patient has submitted paperwork for a "reconsideration" of the review o Paperwork includes letters from Dr. Benson Norway, the patient, her mother, sister, and cousin describing patient disability and inability to work o The patient is awaiting a call to schedule a review of the reconsideration o If the reconsideration is not approved, the patient will have 60 days to formally appeal the reviewers decision . Scheduled follow up call to the patient over the next two weeks o Advised the patient to contact SW sooner as needed  Completed 05/05/20 . Inter-disciplinary care team collaboration (see longitudinal plan of  care) . Successful outbound call placed to the patient to assist with care coordination needs . Determined the patient received a letter stating her case review resulted in a denial of disability benefits . Discussed patient opportunity to appeal the denial o Patient reported she has tried to contact Disability office x 2 with both calls being cut off while holding for assistance . Determined the patient has contacted a lawyer office and spoken to the [place] regarding denial o Patient has provided a copy of the disability denial letter and is awaiting a return call from the lawyer . Advised the patient to contact the lawyer again if she does not receive a return call in the next 24 hours . Encouraged the patient for the steps she has taken to advocate for herself . Scheduled a follow up call for 05/06/20 to assess goal progression . Collaboration with RN Care Manager to inform of goal and plan to assist with care coordination surrounding disability denial  Patient Self Care Activities:  . Patient verbalizes understanding of plan to follow up with lawyer regarding appeal . Self administers medications as prescribed . Attends all scheduled provider appointments . Calls pharmacy for medication refills . Performs ADL's independently  Please see past updates related to this goal by clicking on the "Past Updates" button in the selected goal          Follow Up Plan: SW will follow up with patient by phone over the next two weeks.   [name], BSW, CDP  Social Worker, Transport planner [place] [facility] [place] [facility] [phone]

## 2020-05-06 NOTE — Chronic Care Management (AMB) (Signed)
Original encounter routed to wrong prescriber to co-sign. Corrected.

## 2020-05-06 NOTE — Chronic Care Management (AMB) (Signed)
Chronic Care Management    Social Work Follow Up Note  05/06/2020 Name: Gabrielle Hawkins MRN: [id number] DOB: March 20, 1966  Gabrielle Hawkins is a 54 y.o. year old female who is a primary care patient of Gabrielle Hawkins, [place]. The CCM team was consulted for assistance with care coordination.   Review of patient status, including review of consultants reports, other relevant assessments, and collaboration with appropriate care team members and the patient's provider was performed as part of comprehensive patient evaluation and provision of chronic care management services.    SDOH (Social Determinants of Health) assessments performed: No    Outpatient Encounter Medications as of 05/06/2020  Medication Sig  . albuterol (VENTOLIN HFA) 108 (90 Base) MCG/ACT inhaler Inhale 2 puffs into the lungs every 6 (six) hours as needed for wheezing or shortness of breath.  [name] amitriptyline (ELAVIL) 25 MG tablet Take 25 mg by mouth at bedtime.   . beclomethasone (QVAR) 80 MCG/ACT inhaler Inhale 1 puff into the lungs daily as needed (Shortness of Breath). (Patient not taking: Reported on 04/10/2020)  . celecoxib (CELEBREX) 200 MG capsule Take 200 mg by mouth 2 (two) times daily.   . cetirizine (ZYRTEC) 10 MG tablet Take 10 mg by mouth daily.  [name] dicyclomine (BENTYL) 20 MG tablet Take 20 mg by mouth 4 (four) times daily -  before meals and at bedtime. Up to four times daily as needed  . diphenhydrAMINE (BENADRYL) 25 mg capsule Take 25 mg by mouth daily as needed for itching. (Patient not taking: Reported on 04/10/2020)  . folic acid (FOLVITE) 1 MG tablet Take 3 mg by mouth daily.   [name] gabapentin (NEURONTIN) 300 MG capsule Take 2 capsules (600 mg total) by mouth 3 (three) times daily.  [name] inFLIXimab (REMICADE) 100 MG injection Inject 100 mg into the vein every 30 (thirty) days. Infuse by intravenous route every 4 weeks  Patient is receiving Inflectra brand verses Remicade brand due to insurance (Patient not taking:  Reported on 04/10/2020)  . inFLIXimab in sodium chloride 0.9 % Inject 10 mg/kg into the vein every 30 (thirty) days. Infuse every 4 weeks  . loperamide (IMODIUM) 2 MG capsule Take 1 capsule (2 mg total) by mouth 4 (four) times daily as needed for diarrhea or loose stools.  . meloxicam (MOBIC) 15 MG tablet Take 15 mg by mouth daily. (Patient not taking: Reported on 04/10/2020)  . methotrexate (50 MG/ML) 1 g injection Inject into the vein every Monday. 0.84m every Monday  . olmesartan (BENICAR) 20 MG tablet Take 1 tablet by mouth once daily  . ondansetron (ZOFRAN) 4 MG tablet Take 4 mg by mouth every 6 (six) hours as needed for nausea or vomiting.  . temazepam (RESTORIL) 7.5 MG capsule Take 1 capsule (7.5 mg total) by mouth at bedtime as needed for sleep.  .[name] (ZANAFLEX) 2 MG tablet Take 2 mg by mouth 3 (three) times daily.  . traMADol (ULTRAM) 50 MG tablet Take 50 mg by mouth every 6 (six) hours as needed for moderate pain.    No facility-administered encounter medications on file as of 05/06/2020.     Goals Addressed              This Visit's Progress     Patient Stated   .  "I got a letter they are denying me for disability" (pt-stated)        CARE PLAN ENTRY (see longitudinal plan of care for additional care plan information)  Current Barriers:  .  Chronic conditions including HTN, Ulcerative Colitis, and Crohn's disease which impact patients ability to work . Unknown reason for decision to decline further disability payment after case review  Social Work Clinical Goal(s):  [name] Over the next 5 days the patient will work with a Media planner to initiate appeal process Goal Met . New 05/06/20 Over the next 60 days the patient will work with [place] to complete appeals process regarding Disability review   CCM SW Interventions: Completed 05/06/20 . Successful outbound call placed to the patient to assess goal progression . Determined the patient was contacted by the  lawyer who informed the patient she is unable to assist with this part of the process . Discussed the patients letter stated they reviewer "feels the patient has improved enough to do light work" o Patient reports she spoke with Dr. Benson Hawkins who wrote a more detailed letter entailing why he would not release the patient to return to work . Determined  the patient has submitted paperwork for a "reconsideration" of the review o Paperwork includes letters from Dr. Benson Hawkins, the patient, her mother, sister, and cousin describing patient disability and inability to work o The patient is awaiting a call to schedule a review of the reconsideration o If the reconsideration is not approved, the patient will have 60 days to formally appeal the reviewers decision . Scheduled follow up call to the patient over the next two weeks o Advised the patient to contact SW sooner as needed  Completed 05/05/20 . Inter-disciplinary care team collaboration (see longitudinal plan of care) . Successful outbound call placed to the patient to assist with care coordination needs . Determined the patient received a letter stating her case review resulted in a denial of disability benefits . Discussed patient opportunity to appeal the denial o Patient reported she has tried to contact Disability office x 2 with both calls being cut off while holding for assistance . Determined the patient has contacted a lawyer office and spoken to the [place] regarding denial o Patient has provided a copy of the disability denial letter and is awaiting a return call from the lawyer . Advised the patient to contact the lawyer again if she does not receive a return call in the next 24 hours . Encouraged the patient for the steps she has taken to advocate for herself . Scheduled a follow up call for 05/06/20 to assess goal progression . Collaboration with RN Care Manager to inform of goal and plan to assist with care coordination surrounding disability  denial  Patient Self Care Activities:  . Patient verbalizes understanding of plan to follow up with lawyer regarding appeal . Self administers medications as prescribed . Attends all scheduled provider appointments . Calls pharmacy for medication refills . Performs ADL's independently  Please see past updates related to this goal by clicking on the "Past Updates" button in the selected goal          Follow Up Plan: SW will follow up with patient by phone over the next two weeks.   [name], BSW, CDP Social Worker, Certified Dementia Practitioner [place] [facility] [place] [facility] [phone]  Total time spent performing care coordination and/or care management activities with the patient by phone or face to face = 10 minutes.

## 2020-05-13 ENCOUNTER — Telehealth: Payer: Self-pay

## 2020-05-13 ENCOUNTER — Ambulatory Visit (INDEPENDENT_AMBULATORY_CARE_PROVIDER_SITE_OTHER): Payer: Medicare HMO

## 2020-05-13 DIAGNOSIS — I10 Essential (primary) hypertension: Secondary | ICD-10-CM

## 2020-05-13 DIAGNOSIS — K5180 Other ulcerative colitis without complications: Secondary | ICD-10-CM

## 2020-05-13 NOTE — Patient Instructions (Signed)
Social Worker Visit Information  Goals we discussed today:  Goals Addressed              This Visit's Progress     Patient Stated   .  "I got a letter they are denying me for disability" (pt-stated)        CARE PLAN ENTRY (see longitudinal plan of care for additional care plan information)  Current Barriers:  . Chronic conditions including HTN, Ulcerative Colitis, and Crohn's disease which impact patients ability to work . Unknown reason for decision to decline further disability payment after case review  Social Work Clinical Goal(s):  [name] Over the next 5 days the patient will work with a Media planner to initiate appeal process Goal Met . New 05/06/20 Over the next 60 days the patient will work with [place] to complete appeals process regarding Disability review   CCM SW Interventions: Completed 05/13/20 . Received inbound call from the patient who reports her disability benefits have now stopped and she will lose her health insurance after this month o The patient is very concerned with losing insurance due to not being able to continue infusion treatments o The patient reports her income has now stopped without notice or response from "reconsideration" the patient submitted . Determined the patient was contacted by the Disability Case Reviewer who was unable to communicate why the patients benefits were stopped so soon without notice. The reviewer was also unable to confirm the reconsideration was received or how long it would take for the reconsideration to take place . Provided the patient with the number to the [place] in [place] to discuss needs . Collaboration with the patients primary provider and RN Care Manager to provide an update on goal progression  Completed 05/06/20 . Successful outbound call placed to the patient to assess goal progression . Determined the patient was contacted by the lawyer who informed the patient she is unable to  assist with this part of the process . Discussed the patients letter stated they reviewer "feels the patient has improved enough to do light work" o Patient reports she spoke with Dr. Benson Norway who wrote a more detailed letter entailing why he would not release the patient to return to work . Determined  the patient has submitted paperwork for a "reconsideration" of the review o Paperwork includes letters from Dr. Benson Norway, the patient, her mother, sister, and cousin describing patient disability and inability to work o The patient is awaiting a call to schedule a review of the reconsideration o If the reconsideration is not approved, the patient will have 60 days to formally appeal the reviewers decision . Scheduled follow up call to the patient over the next two weeks o Advised the patient to contact SW sooner as needed  Completed 05/05/20 . Inter-disciplinary care team collaboration (see longitudinal plan of care) . Successful outbound call placed to the patient to assist with care coordination needs . Determined the patient received a letter stating her case review resulted in a denial of disability benefits . Discussed patient opportunity to appeal the denial o Patient reported she has tried to contact Disability office x 2 with both calls being cut off while holding for assistance . Determined the patient has contacted a lawyer office and spoken to the [place] regarding denial o Patient has provided a copy of the disability denial letter and is awaiting a return call from the lawyer . Advised the patient to contact the lawyer again if she does  not receive a return call in the next 24 hours . Encouraged the patient for the steps she has taken to advocate for herself . Scheduled a follow up call for 05/06/20 to assess goal progression . Collaboration with RN Care Manager to inform of goal and plan to assist with care coordination surrounding disability denial  Patient Self Care Activities:   . Patient verbalizes understanding of plan to follow up with lawyer regarding appeal . Self administers medications as prescribed . Attends all scheduled provider appointments . Calls pharmacy for medication refills . Performs ADL's independently  Please see past updates related to this goal by clicking on the "Past Updates" button in the selected goal          Follow Up Plan: SW will follow up with patient by phone over the next week.  [name], BSW, CDP Social Worker, Certified Dementia Practitioner [place] [facility] [place] [facility] [phone]

## 2020-05-13 NOTE — Telephone Encounter (Signed)
  Chronic Care Management   Outreach Note  05/13/2020 Name: Gabrielle Hawkins MRN: [id number] DOB: [birth year]  Referred by: [name], FNP Reason for referral : Care Coordination   Sw placed an unsuccessful outbound call in response to voice message received requesting a return call. HIPAA compliant voice message left requesting a return call.  [name], BSW, CDP Social Worker, Certified Dementia Practitioner [place] [facility] [place] [facility] [phone]

## 2020-05-13 NOTE — Chronic Care Management (AMB) (Signed)
Chronic Care Management    Social Work Follow Up Note  05/13/2020 Name: SOLEIA BADOLATO MRN: [id number] DOB: 04-Aug-1966  BETHANNY TOELLE is a 54 y.o. year old female who is a primary care patient of [name], [place]. The CCM team was consulted for assistance with care coordination.   Review of patient status, including review of consultants reports, other relevant assessments, and collaboration with appropriate care team members and the patient's provider was performed as part of comprehensive patient evaluation and provision of chronic care management services.    SDOH (Social Determinants of Health) assessments performed: No    Outpatient Encounter Medications as of 05/13/2020  Medication Sig  . albuterol (VENTOLIN HFA) 108 (90 Base) MCG/ACT inhaler Inhale 2 puffs into the lungs every 6 (six) hours as needed for wheezing or shortness of breath.  [name] amitriptyline (ELAVIL) 25 MG tablet Take 25 mg by mouth at bedtime.   . beclomethasone (QVAR) 80 MCG/ACT inhaler Inhale 1 puff into the lungs daily as needed (Shortness of Breath). (Patient not taking: Reported on 04/10/2020)  . celecoxib (CELEBREX) 200 MG capsule Take 200 mg by mouth 2 (two) times daily.   . cetirizine (ZYRTEC) 10 MG tablet Take 10 mg by mouth daily.  [name] dicyclomine (BENTYL) 20 MG tablet Take 20 mg by mouth 4 (four) times daily -  before meals and at bedtime. Up to four times daily as needed  . diphenhydrAMINE (BENADRYL) 25 mg capsule Take 25 mg by mouth daily as needed for itching. (Patient not taking: Reported on 04/10/2020)  . folic acid (FOLVITE) 1 MG tablet Take 3 mg by mouth daily.   [name] gabapentin (NEURONTIN) 300 MG capsule Take 2 capsules (600 mg total) by mouth 3 (three) times daily.  [name] inFLIXimab (REMICADE) 100 MG injection Inject 100 mg into the vein every 30 (thirty) days. Infuse by intravenous route every 4 weeks  Patient is receiving Inflectra brand verses Remicade brand due to insurance (Patient not taking:  Reported on 04/10/2020)  . inFLIXimab in sodium chloride 0.9 % Inject 10 mg/kg into the vein every 30 (thirty) days. Infuse every 4 weeks  . loperamide (IMODIUM) 2 MG capsule Take 1 capsule (2 mg total) by mouth 4 (four) times daily as needed for diarrhea or loose stools.  . meloxicam (MOBIC) 15 MG tablet Take 15 mg by mouth daily. (Patient not taking: Reported on 04/10/2020)  . methotrexate (50 MG/ML) 1 g injection Inject into the vein every Monday. 0.20m every Monday  . olmesartan (BENICAR) 20 MG tablet Take 1 tablet by mouth once daily  . ondansetron (ZOFRAN) 4 MG tablet Take 4 mg by mouth every 6 (six) hours as needed for nausea or vomiting.  . temazepam (RESTORIL) 7.5 MG capsule Take 1 capsule (7.5 mg total) by mouth at bedtime as needed for sleep.  .[name] (ZANAFLEX) 2 MG tablet Take 2 mg by mouth 3 (three) times daily.  . traMADol (ULTRAM) 50 MG tablet Take 50 mg by mouth every 6 (six) hours as needed for moderate pain.    No facility-administered encounter medications on file as of 05/13/2020.     Goals Addressed              This Visit's Progress     Patient Stated   .  "I got a letter they are denying me for disability" (pt-stated)        CARE PLAN ENTRY (see longitudinal plan of care for additional care plan information)  Current Barriers:  .  Chronic conditions including HTN, Ulcerative Colitis, and Crohn's disease which impact patients ability to work . Unknown reason for decision to decline further disability payment after case review  Social Work Clinical Goal(s):  [name] Over the next 5 days the patient will work with a Media planner to initiate appeal process Goal Met . New 05/06/20 Over the next 60 days the patient will work with [place] to complete appeals process regarding Disability review   CCM SW Interventions: Completed 05/13/20 . Received inbound call from the patient who reports her disability benefits have now stopped and she will lose her health  insurance after this month o The patient is very concerned with losing insurance due to not being able to continue infusion treatments o The patient reports her income has now stopped without notice or response from "reconsideration" the patient submitted . Determined the patient was contacted by the Disability Case Reviewer who was unable to communicate why the patients benefits were stopped so soon without notice. The reviewer was also unable to confirm the reconsideration was received or how long it would take for the reconsideration to take place . Provided the patient with the number to the [place] in [place] to discuss needs . Collaboration with the patients primary provider and RN Care Manager to provide an update on goal progression  Completed 05/06/20 . Successful outbound call placed to the patient to assess goal progression . Determined the patient was contacted by the lawyer who informed the patient she is unable to assist with this part of the process . Discussed the patients letter stated they reviewer "feels the patient has improved enough to do light work" o Patient reports she spoke with Dr. Benson Norway who wrote a more detailed letter entailing why he would not release the patient to return to work . Determined  the patient has submitted paperwork for a "reconsideration" of the review o Paperwork includes letters from Dr. Benson Norway, the patient, her mother, sister, and cousin describing patient disability and inability to work o The patient is awaiting a call to schedule a review of the reconsideration o If the reconsideration is not approved, the patient will have 60 days to formally appeal the reviewers decision . Scheduled follow up call to the patient over the next two weeks o Advised the patient to contact SW sooner as needed  Completed 05/05/20 . Inter-disciplinary care team collaboration (see longitudinal plan of care) . Successful outbound call placed to the  patient to assist with care coordination needs . Determined the patient received a letter stating her case review resulted in a denial of disability benefits . Discussed patient opportunity to appeal the denial o Patient reported she has tried to contact Disability office x 2 with both calls being cut off while holding for assistance . Determined the patient has contacted a lawyer office and spoken to the [place] regarding denial o Patient has provided a copy of the disability denial letter and is awaiting a return call from the lawyer . Advised the patient to contact the lawyer again if she does not receive a return call in the next 24 hours . Encouraged the patient for the steps she has taken to advocate for herself . Scheduled a follow up call for 05/06/20 to assess goal progression . Collaboration with RN Care Manager to inform of goal and plan to assist with care coordination surrounding disability denial  Patient Self Care Activities:  . Patient verbalizes understanding of plan to follow up with lawyer  regarding appeal . Self administers medications as prescribed . Attends all scheduled provider appointments . Calls pharmacy for medication refills . Performs ADL's independently  Please see past updates related to this goal by clicking on the "Past Updates" button in the selected goal          Follow Up Plan: SW will follow up with patient by phone over the next week.   [name], BSW, CDP Social Worker, Certified Dementia Practitioner [place] [facility] [place] [facility] [phone]  Total time spent performing care coordination and/or care management activities with the patient by phone or face to face = 18 minutes.

## 2020-05-18 ENCOUNTER — Ambulatory Visit: Payer: Medicare HMO

## 2020-05-18 DIAGNOSIS — K5180 Other ulcerative colitis without complications: Secondary | ICD-10-CM

## 2020-05-18 DIAGNOSIS — I10 Essential (primary) hypertension: Secondary | ICD-10-CM

## 2020-05-18 NOTE — Patient Instructions (Signed)
Social Worker Visit Information  Goals we discussed today:  Goals Addressed              This Visit's Progress     Patient Stated   .  "I got a letter they are denying me for disability" (pt-stated)        CARE PLAN ENTRY (see longitudinal plan of care for additional care plan information)  Current Barriers:  . Chronic conditions including HTN, Ulcerative Colitis, and Crohn's disease which impact patients ability to work . Unknown reason for decision to decline further disability payment after case review  Social Work Clinical Goal(s):  [name] Over the next 5 days the patient will work with a Media planner to initiate appeal process Goal Met . New 05/06/20 Over the next 60 days the patient will work with [place] to complete appeals process regarding Disability review   CCM SW Interventions: Completed 05/18/20 . Successful outbound call placed to the patient to assess goal progression . Determined the patient just ended a meeting with the [place] who plans to work with the patient in the appeals process . Discussed the patient is beginning to face challenges associated with funds to afford food and utility costs . Advised the patient to contact her local DSS to request financial assistance  . Scheduled follow up call over the next week  Completed 05/13/20 . Received inbound call from the patient who reports her disability benefits have now stopped and she will lose her health insurance after this month o The patient is very concerned with losing insurance due to not being able to continue infusion treatments o The patient reports her income has now stopped without notice or response from "reconsideration" the patient submitted . Determined the patient was contacted by the Disability Case Reviewer who was unable to communicate why the patients benefits were stopped so soon without notice. The reviewer was also unable to confirm the reconsideration was received  or how long it would take for the reconsideration to take place . Provided the patient with the number to the [place] in [place] to discuss needs . Collaboration with the patients primary provider and RN Care Manager to provide an update on goal progression  Completed 05/06/20 . Successful outbound call placed to the patient to assess goal progression . Determined the patient was contacted by the lawyer who informed the patient she is unable to assist with this part of the process . Discussed the patients letter stated they reviewer "feels the patient has improved enough to do light work" o Patient reports she spoke with Dr. Benson Norway who wrote a more detailed letter entailing why he would not release the patient to return to work . Determined  the patient has submitted paperwork for a "reconsideration" of the review o Paperwork includes letters from Dr. Benson Norway, the patient, her mother, sister, and cousin describing patient disability and inability to work o The patient is awaiting a call to schedule a review of the reconsideration o If the reconsideration is not approved, the patient will have 60 days to formally appeal the reviewers decision . Scheduled follow up call to the patient over the next two weeks o Advised the patient to contact SW sooner as needed  Completed 05/05/20 . Inter-disciplinary care team collaboration (see longitudinal plan of care) . Successful outbound call placed to the patient to assist with care coordination needs . Determined the patient received a letter stating her case review resulted in a denial of  disability benefits . Discussed patient opportunity to appeal the denial o Patient reported she has tried to contact Disability office x 2 with both calls being cut off while holding for assistance . Determined the patient has contacted a lawyer office and spoken to the [place] regarding denial o Patient has provided a copy of the disability denial  letter and is awaiting a return call from the lawyer . Advised the patient to contact the lawyer again if she does not receive a return call in the next 24 hours . Encouraged the patient for the steps she has taken to advocate for herself . Scheduled a follow up call for 05/06/20 to assess goal progression . Collaboration with RN Care Manager to inform of goal and plan to assist with care coordination surrounding disability denial  Patient Self Care Activities:  . Patient verbalizes understanding of plan to follow up with lawyer regarding appeal . Self administers medications as prescribed . Attends all scheduled provider appointments . Calls pharmacy for medication refills . Performs ADL's independently  Please see past updates related to this goal by clicking on the "Past Updates" button in the selected goal          Follow Up Plan: SW will follow up with patient by phone over the next week.  [name], BSW, CDP Social Worker, Certified Dementia Practitioner [place] [facility] [phone]

## 2020-05-18 NOTE — Chronic Care Management (AMB) (Signed)
Chronic Care Management    Social Work Follow Up Note  05/18/2020 Name: Gabrielle Hawkins MRN: [id number] DOB: 11-17-1965  Gabrielle Hawkins is a 54 y.o. year old female who is a primary care patient of Gabrielle Hawkins, Gabrielle Hawkins. The CCM team was consulted for assistance with care coordination.   Review of patient status, including review of consultants reports, other relevant assessments, and collaboration with appropriate care team members and the patient's provider was performed as part of comprehensive patient evaluation and provision of chronic care management services.    SDOH (Social Determinants of Health) assessments performed: No    Outpatient Encounter Medications as of 05/18/2020  Medication Sig   albuterol (VENTOLIN HFA) 108 (90 Base) MCG/ACT inhaler Inhale 2 puffs into the lungs every 6 (six) hours as needed for wheezing or shortness of breath.   amitriptyline (ELAVIL) 25 MG tablet Take 25 mg by mouth at bedtime.    beclomethasone (QVAR) 80 MCG/ACT inhaler Inhale 1 puff into the lungs daily as needed (Shortness of Breath). (Patient not taking: Reported on 04/10/2020)   celecoxib (CELEBREX) 200 MG capsule Take 200 mg by mouth 2 (two) times daily.    cetirizine (ZYRTEC) 10 MG tablet Take 10 mg by mouth daily.   dicyclomine (BENTYL) 20 MG tablet Take 20 mg by mouth 4 (four) times daily -  before meals and at bedtime. Up to four times daily as needed   diphenhydrAMINE (BENADRYL) 25 mg capsule Take 25 mg by mouth daily as needed for itching. (Patient not taking: Reported on 01/10/8468)   folic acid (FOLVITE) 1 MG tablet Take 3 mg by mouth daily.    gabapentin (NEURONTIN) 300 MG capsule Take 2 capsules (600 mg total) by mouth 3 (three) times daily.   inFLIXimab (REMICADE) 100 MG injection Inject 100 mg into the vein every 30 (thirty) days. Infuse by intravenous route every 4 weeks  Patient is receiving Inflectra brand verses Remicade brand due to insurance (Patient not taking:  Reported on 04/10/2020)   inFLIXimab in sodium chloride 0.9 % Inject 10 mg/kg into the vein every 30 (thirty) days. Infuse every 4 weeks   loperamide (IMODIUM) 2 MG capsule Take 1 capsule (2 mg total) by mouth 4 (four) times daily as needed for diarrhea or loose stools.   meloxicam (MOBIC) 15 MG tablet Take 15 mg by mouth daily. (Patient not taking: Reported on 04/10/2020)   methotrexate (50 MG/ML) 1 g injection Inject into the vein every Monday. 0.71m every Monday   olmesartan (BENICAR) 20 MG tablet Take 1 tablet by mouth once daily   ondansetron (ZOFRAN) 4 MG tablet Take 4 mg by mouth every 6 (six) hours as needed for nausea or vomiting.   temazepam (RESTORIL) 7.5 MG capsule Take 1 capsule (7.5 mg total) by mouth at bedtime as needed for sleep.   tiZANidine (ZANAFLEX) 2 MG tablet Take 2 mg by mouth 3 (three) times daily.   traMADol (ULTRAM) 50 MG tablet Take 50 mg by mouth every 6 (six) hours as needed for moderate pain.    No facility-administered encounter medications on file as of 05/18/2020.     Goals Addressed              This Visit's Progress     Patient Stated     "I got a letter they are denying me for disability" (pt-stated)        CARE PLAN ENTRY (see longitudinal plan of care for additional care plan information)  Current Barriers:  Chronic conditions including HTN, Ulcerative Colitis, and Crohn's disease which impact patients ability to work  Unknown reason for decision to decline further disability payment after case review  Social Work Clinical Goal(s):   Over the next 5 days the patient will work with a Media planner to initiate appeal process Goal Met  New 05/06/20 Over the next 60 days the patient will work with Gabrielle Hawkins to complete appeals process regarding Disability review   CCM SW Interventions: Completed 05/18/20  Successful outbound call placed to the patient to assess goal progression  Determined the patient just ended a meeting  with the Gabrielle Hawkins who plans to work with the patient in the appeals process  Discussed the patient is beginning to face challenges associated with funds to afford food and utility costs  Advised the patient to contact her local DSS to request financial assistance   Scheduled follow up call over the next week  Completed 05/13/20  Received inbound call from the patient who reports her disability benefits have now stopped and she will lose her health insurance after this month o The patient is very concerned with losing insurance due to not being able to continue infusion treatments o The patient reports her income has now stopped without notice or response from "reconsideration" the patient submitted  Determined the patient was contacted by the Disability Case Reviewer who was unable to communicate why the patients benefits were stopped so soon without notice. The reviewer was also unable to confirm the reconsideration was received or how long it would take for the reconsideration to take place  Provided the patient with the number to the Gabrielle Hawkins in [place] to discuss needs  Collaboration with the patients primary provider and Gabrielle Hawkins to provide an update on goal progression  Completed 05/06/20  Successful outbound call placed to the patient to assess goal progression  Determined the patient was contacted by the lawyer who informed the patient she is unable to assist with this part of the process  Discussed the patients letter stated they reviewer "feels the patient has improved enough to do light work" o Patient reports she spoke with Gabrielle Hawkins who wrote a more detailed letter entailing why he would not release the patient to return to work  Determined  the patient has submitted paperwork for a "reconsideration" of the review o Paperwork includes letters from Gabrielle Hawkins, the patient, her mother, sister, and cousin describing patient disability and  inability to work o The patient is awaiting a call to schedule a review of the reconsideration o If the reconsideration is not approved, the patient will have 60 days to formally appeal the reviewers decision  Scheduled follow up call to the patient over the next two weeks o Advised the patient to contact SW sooner as needed  Completed 05/05/20  Inter-disciplinary care team collaboration (see longitudinal plan of care)  Successful outbound call placed to the patient to assist with care coordination needs  Determined the patient received a letter stating her case review resulted in a denial of disability benefits  Discussed patient opportunity to appeal the denial o Patient reported she has tried to contact Disability office x 2 with both calls being cut off while holding for assistance  Determined the patient has contacted a lawyer office and spoken to the [place] regarding denial o Patient has provided a copy of the disability denial letter and is awaiting a return call from the lawyer  Advised the patient to  contact the lawyer again if she does not receive a return call in the next 24 hours  Encouraged the patient for the steps she has taken to advocate for herself  Scheduled a follow up call for 05/06/20 to assess goal progression  Collaboration with RN Care Manager to inform of goal and plan to assist with care coordination surrounding disability denial  Patient Self Care Activities:   Patient verbalizes understanding of plan to follow up with lawyer regarding appeal  Self administers medications as prescribed  Attends all scheduled provider appointments  Calls pharmacy for medication refills  Performs ADL's independently  Please see past updates related to this goal by clicking on the "Past Updates" button in the selected goal          Follow Up Plan: SW will follow up with patient by phone over the next week.   [name], BSW, CDP Social Worker, Certified  Dementia Practitioner [place] [facility] [phone]  Total time spent performing care coordination and/or care management activities with the patient by phone or face to face = 10 minutes.

## 2020-05-21 ENCOUNTER — Ambulatory Visit: Payer: Medicare HMO | Admitting: Neurology

## 2020-05-25 ENCOUNTER — Encounter: Payer: Self-pay | Admitting: Nurse Practitioner

## 2020-05-25 ENCOUNTER — Other Ambulatory Visit: Payer: Self-pay

## 2020-05-25 DIAGNOSIS — I10 Essential (primary) hypertension: Secondary | ICD-10-CM

## 2020-05-25 MED ORDER — OLMESARTAN MEDOXOMIL 20 MG PO TABS: 20.0000 mg | ORAL_TABLET | Freq: Every day | ORAL | 1 refills | Status: DC

## 2020-05-27 DIAGNOSIS — K5190 Ulcerative colitis, unspecified, without complications: Secondary | ICD-10-CM | POA: Diagnosis not present

## 2020-05-28 ENCOUNTER — Ambulatory Visit: Payer: Medicare HMO

## 2020-05-28 DIAGNOSIS — I10 Essential (primary) hypertension: Secondary | ICD-10-CM

## 2020-05-28 DIAGNOSIS — K5180 Other ulcerative colitis without complications: Secondary | ICD-10-CM

## 2020-05-29 ENCOUNTER — Telehealth: Payer: Self-pay

## 2020-05-29 NOTE — Chronic Care Management (AMB) (Signed)
Chronic Care Management    Social Work Follow Up Note  05/29/2020 Name: Gabrielle Hawkins MRN: [id number] DOB: 10-Nov-1965  Gabrielle Hawkins is a 54 y.o. year old female who is a primary care patient of [name], [place]. The CCM team was consulted for assistance with care coordination.   Review of patient status, including review of consultants reports, other relevant assessments, and collaboration with appropriate care team members and the patient's provider was performed as part of comprehensive patient evaluation and provision of chronic care management services.    SDOH (Social Determinants of Health) assessments performed: No    Outpatient Encounter Medications as of 05/28/2020  Medication Sig  . albuterol (VENTOLIN HFA) 108 (90 Base) MCG/ACT inhaler Inhale 2 puffs into the lungs every 6 (six) hours as needed for wheezing or shortness of breath.  [name] amitriptyline (ELAVIL) 25 MG tablet Take 25 mg by mouth at bedtime.   . beclomethasone (QVAR) 80 MCG/ACT inhaler Inhale 1 puff into the lungs daily as needed (Shortness of Breath). (Patient not taking: Reported on 04/10/2020)  . celecoxib (CELEBREX) 200 MG capsule Take 200 mg by mouth 2 (two) times daily.   . cetirizine (ZYRTEC) 10 MG tablet Take 10 mg by mouth daily.  [name] dicyclomine (BENTYL) 20 MG tablet Take 20 mg by mouth 4 (four) times daily -  before meals and at bedtime. Up to four times daily as needed  . diphenhydrAMINE (BENADRYL) 25 mg capsule Take 25 mg by mouth daily as needed for itching. (Patient not taking: Reported on 04/10/2020)  . folic acid (FOLVITE) 1 MG tablet Take 3 mg by mouth daily.   [name] gabapentin (NEURONTIN) 300 MG capsule Take 2 capsules (600 mg total) by mouth 3 (three) times daily.  [name] inFLIXimab (REMICADE) 100 MG injection Inject 100 mg into the vein every 30 (thirty) days. Infuse by intravenous route every 4 weeks  Patient is receiving Inflectra brand verses Remicade brand due to insurance (Patient not taking:  Reported on 04/10/2020)  . inFLIXimab in sodium chloride 0.9 % Inject 10 mg/kg into the vein every 30 (thirty) days. Infuse every 4 weeks  . loperamide (IMODIUM) 2 MG capsule Take 1 capsule (2 mg total) by mouth 4 (four) times daily as needed for diarrhea or loose stools.  . meloxicam (MOBIC) 15 MG tablet Take 15 mg by mouth daily. (Patient not taking: Reported on 04/10/2020)  . methotrexate (50 MG/ML) 1 g injection Inject into the vein every Monday. 0.18m every Monday  . olmesartan (BENICAR) 20 MG tablet Take 1 tablet (20 mg total) by mouth daily.  . ondansetron (ZOFRAN) 4 MG tablet Take 4 mg by mouth every 6 (six) hours as needed for nausea or vomiting.  . temazepam (RESTORIL) 7.5 MG capsule Take 1 capsule (7.5 mg total) by mouth at bedtime as needed for sleep.  .[name] (ZANAFLEX) 2 MG tablet Take 2 mg by mouth 3 (three) times daily.  . traMADol (ULTRAM) 50 MG tablet Take 50 mg by mouth every 6 (six) hours as needed for moderate pain.    No facility-administered encounter medications on file as of 05/28/2020.     Goals Addressed              This Visit's Progress     Patient Stated   .  "I got a letter they are denying me for disability" (pt-stated)        CARE PLAN ENTRY (see longitudinal plan of care for additional care plan information)  Current  Barriers:  . Chronic conditions including HTN, Ulcerative Colitis, and Crohn's disease which impact patients ability to work . Unknown reason for decision to decline further disability payment after case review  Social Work Clinical Goal(s):  [name] Over the next 5 days the patient will work with a Media planner to initiate appeal process Goal Met . New 05/06/20 Over the next 60 days the patient will work with [place] to complete appeals process regarding Disability review   CCM SW Interventions: Completed 05/28/20 . Successful outbound call placed to the patient to assess goal progression . Determined the patient has been in  contact with Mrs. Glenford Peers with [place] to determine Mrs. Glenford Peers has yet to receive a response from Disability regarding patient appeal . Discussed the patient is actively looking for jobs she may be successful at due to concern with finances . Advised patient to contact SW once she receives a response from disability regarding appeal status . Scheduled follow up call over the next month  Completed 05/18/20 . Successful outbound call placed to the patient to assess goal progression . Determined the patient just ended a meeting with the [place] who plans to work with the patient in the appeals process . Discussed the patient is beginning to face challenges associated with funds to afford food and utility costs . Advised the patient to contact her local DSS to request financial assistance  . Scheduled follow up call over the next week  Completed 05/13/20 . Received inbound call from the patient who reports her disability benefits have now stopped and she will lose her health insurance after this month o The patient is very concerned with losing insurance due to not being able to continue infusion treatments o The patient reports her income has now stopped without notice or response from "reconsideration" the patient submitted . Determined the patient was contacted by the Disability Case Reviewer who was unable to communicate why the patients benefits were stopped so soon without notice. The reviewer was also unable to confirm the reconsideration was received or how long it would take for the reconsideration to take place . Provided the patient with the number to the [facility] in [place] to discuss needs . Collaboration with the patients primary provider and RN Care Manager to provide an update on goal progression  Completed 05/06/20 . Successful outbound call placed to the patient to assess goal progression . Determined the patient was  contacted by the lawyer who informed the patient she is unable to assist with this part of the process . Discussed the patients letter stated they reviewer "feels the patient has improved enough to do light work" o Patient reports she spoke with Dr. Benson Norway who wrote a more detailed letter entailing why he would not release the patient to return to work . Determined  the patient has submitted paperwork for a "reconsideration" of the review o Paperwork includes letters from Dr. Benson Norway, the patient, her mother, sister, and cousin describing patient disability and inability to work o The patient is awaiting a call to schedule a review of the reconsideration o If the reconsideration is not approved, the patient will have 60 days to formally appeal the reviewers decision . Scheduled follow up call to the patient over the next two weeks o Advised the patient to contact SW sooner as needed  Completed 05/05/20 . Inter-disciplinary care team collaboration (see longitudinal plan of care) . Successful outbound call placed to the patient to assist with  care coordination needs . Determined the patient received a letter stating her case review resulted in a denial of disability benefits . Discussed patient opportunity to appeal the denial o Patient reported she has tried to contact Disability office x 2 with both calls being cut off while holding for assistance . Determined the patient has contacted a lawyer office and spoken to the [place] regarding denial o Patient has provided a copy of the disability denial letter and is awaiting a return call from the lawyer . Advised the patient to contact the lawyer again if she does not receive a return call in the next 24 hours . Encouraged the patient for the steps she has taken to advocate for herself . Scheduled a follow up call for 05/06/20 to assess goal progression . Collaboration with RN Care Manager to inform of goal and plan to assist with care coordination  surrounding disability denial  Patient Self Care Activities:  . Patient verbalizes understanding of plan to follow up with lawyer regarding appeal . Self administers medications as prescribed . Attends all scheduled provider appointments . Calls pharmacy for medication refills . Performs ADL's independently  Please see past updates related to this goal by clicking on the "Past Updates" button in the selected goal          Follow Up Plan: SW will follow up with patient by phone over the next month.   [name], BSW, CDP Social Worker, Certified Dementia Practitioner [facility] [phone]  Total time spent performing care coordination and/or care management activities with the patient by phone or face to face = 8 minutes.

## 2020-05-29 NOTE — Patient Instructions (Signed)
Social Worker Visit Information  Goals we discussed today:  Goals Addressed              This Visit's Progress     Patient Stated   .  "I got a letter they are denying me for disability" (pt-stated)        CARE PLAN ENTRY (see longitudinal plan of care for additional care plan information)  Current Barriers:  . Chronic conditions including HTN, Ulcerative Colitis, and Crohn's disease which impact patients ability to work . Unknown reason for decision to decline further disability payment after case review  Social Work Clinical Goal(s):  [name] Over the next 5 days the patient will work with a Media planner to initiate appeal process Goal Met . New 05/06/20 Over the next 60 days the patient will work with [place] to complete appeals process regarding Disability review   CCM SW Interventions: Completed 05/28/20 . Successful outbound call placed to the patient to assess goal progression . Determined the patient has been in contact with Mrs. Glenford Peers with [place] [facility] to determine Mrs. Glenford Peers has yet to receive a response from Disability regarding patient appeal . Discussed the patient is actively looking for jobs she may be successful at due to concern with finances . Advised patient to contact SW once she receives a response from disability regarding appeal status . Scheduled follow up call over the next month  Completed 05/18/20 . Successful outbound call placed to the patient to assess goal progression . Determined the patient just ended a meeting with the [place] who plans to work with the patient in the appeals process . Discussed the patient is beginning to face challenges associated with funds to afford food and utility costs . Advised the patient to contact her local DSS to request financial assistance  . Scheduled follow up call over the next week  Completed 05/13/20 . Received inbound call from the patient who reports her disability  benefits have now stopped and she will lose her health insurance after this month o The patient is very concerned with losing insurance due to not being able to continue infusion treatments o The patient reports her income has now stopped without notice or response from "reconsideration" the patient submitted . Determined the patient was contacted by the Disability Case Reviewer who was unable to communicate why the patients benefits were stopped so soon without notice. The reviewer was also unable to confirm the reconsideration was received or how long it would take for the reconsideration to take place . Provided the patient with the number to the [facility] in [place] to discuss needs . Collaboration with the patients primary provider and RN Care Manager to provide an update on goal progression  Completed 05/06/20 . Successful outbound call placed to the patient to assess goal progression . Determined the patient was contacted by the lawyer who informed the patient she is unable to assist with this part of the process . Discussed the patients letter stated they reviewer "feels the patient has improved enough to do light work" o Patient reports she spoke with Dr. Benson Norway who wrote a more detailed letter entailing why he would not release the patient to return to work . Determined  the patient has submitted paperwork for a "reconsideration" of the review o Paperwork includes letters from Dr. Benson Norway, the patient, her mother, sister, and cousin describing patient disability and inability to work o The patient is awaiting a call to schedule a  review of the reconsideration o If the reconsideration is not approved, the patient will have 60 days to formally appeal the reviewers decision . Scheduled follow up call to the patient over the next two weeks o Advised the patient to contact SW sooner as needed  Completed 05/05/20 . Inter-disciplinary care team collaboration (see longitudinal plan  of care) . Successful outbound call placed to the patient to assist with care coordination needs . Determined the patient received a letter stating her case review resulted in a denial of disability benefits . Discussed patient opportunity to appeal the denial o Patient reported she has tried to contact Disability office x 2 with both calls being cut off while holding for assistance . Determined the patient has contacted a lawyer office and spoken to the [place] regarding denial o Patient has provided a copy of the disability denial letter and is awaiting a return call from the lawyer . Advised the patient to contact the lawyer again if she does not receive a return call in the next 24 hours . Encouraged the patient for the steps she has taken to advocate for herself . Scheduled a follow up call for 05/06/20 to assess goal progression . Collaboration with RN Care Manager to inform of goal and plan to assist with care coordination surrounding disability denial  Patient Self Care Activities:  . Patient verbalizes understanding of plan to follow up with lawyer regarding appeal . Self administers medications as prescribed . Attends all scheduled provider appointments . Calls pharmacy for medication refills . Performs ADL's independently  Please see past updates related to this goal by clicking on the "Past Updates" button in the selected goal          Follow Up Plan: SW will follow up with patient by phone over the next month.   [name], BSW, CDP Social Worker, Certified Dementia Practitioner [facility] [facility] [phone]

## 2020-05-29 NOTE — Chronic Care Management (AMB) (Deleted)
Chronic Care Management Pharmacy  Name: Gabrielle Hawkins  MRN: [id number] DOB: April 06, 1966  Chief Complaint/ HPI  Gabrielle Hawkins,  54 y.o. , female presents for their Follow-Up CCM visit with the clinical pharmacist via telephone due to COVID-19 Pandemic.  PCP : Gabrielle Brine, FNP  Their chronic conditions include: Hypertension and Ulcerative colitis and Insomnia  Office Visits: 03/30/20 Telephone call: Pt requesting refill for BP medication. Also complained of chronic fatigue and what tests are required to be testes for chronic fatigue syndrome.   02/26/20 OV: BP is controlled. Labs ordered (CBC w/ diff). Temazepam has been effective to help with insomnia, refill sent. Consider changing to mirtazapine to help with appetite if temazepam not effective.   Consult Visits: 04/17/20 Urgent Care OV: Presented with hematuria. Diagnosed with pyelonephritis. Urine culture obtained. Started on Bactrim DS twice daily for 7 days.   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 Order Xray of right hip.   02/12/20 Neurology OV w/ Gabrielle Hawkins: Continued complaints of right-sided low back pain radiating to right leg. Nerve conduction study in 03/2019 of right lower extremity was essentially normal. MRI of lumbar spine ordered. Increase gabapentin to 682m three times daily. Also taking meloxicam, tizanidine, amitriptyline, Celebrex, and tramadol. Consider physical therapy. Follow up in 3 months.  02/04/20 Urgent Care OV: Back pain. Xray of lumbar spine showed moderate multilevel degenerative disk disease. No fracture or lesions. Lower lumbar facet arthropathy is noted. Lumbar scoliosis present.   01/30/20: Renflexis infusion for ulcerative colitis  01/02/20: Renflexis infusion for ulcerative colitis  12/09/19 Urgent Care OV: Urinalysis/urine culture abnormal. Urinary tract  infection. Started Keflex 5069mfour times daily for 7 days.Abdominal Xray for left flank pain showed large amount of fecal retention throughout colon. No urinary tract calculi or stones.   11/06/19: Renflexis infusion for ulcerative colitis  10/09/19: Renflexis infusion for ulcerative colitis  CCM Encounters: 03/18/20 SW: Gabrielle Hawkins.   02/25/20 RN: Established care plan. Referred to PharmD for medication assistance.  Medications: Outpatient Encounter Medications as of 05/29/2020  Medication Sig  . albuterol (VENTOLIN HFA) 108 (90 Base) MCG/ACT inhaler Inhale 2 puffs into the lungs every 6 (six) hours as needed for wheezing or shortness of breath.  . Marland Kitchenmitriptyline (ELAVIL) 25 MG tablet Take 25 mg by mouth at bedtime.   . beclomethasone (QVAR) 80 MCG/ACT inhaler Inhale 1 puff into the lungs daily as needed (Shortness of Breath). (Patient not taking: Reported on 04/10/2020)  . celecoxib (CELEBREX) 200 MG capsule Take 200 mg by mouth 2 (two) times daily.   . cetirizine (ZYRTEC) 10 MG tablet Take 10 mg by mouth daily.  . Marland Kitchenicyclomine (BENTYL) 20 MG tablet Take 20 mg by mouth 4 (four) times daily -  before meals and at bedtime. Up to four times daily as needed  . diphenhydrAMINE (BENADRYL) 25 mg capsule Take 25 mg by mouth daily as needed for itching. (Patient not taking: Reported on 04/10/2020)  . folic acid (FOLVITE) 1 MG tablet Take 3 mg by mouth daily.   . Marland Kitchenabapentin (NEURONTIN) 300 MG capsule Take 2 capsules (600 mg total) by mouth 3 (three) times daily.  . Marland KitchennFLIXimab (REMICADE) 100 MG injection Inject 100 mg into the vein every 30 (thirty) days. Infuse by intravenous route every 4 weeks  Patient is receiving Inflectra brand verses Remicade brand due to insurance (Patient not taking:  Reported on 04/10/2020)  . inFLIXimab in sodium chloride 0.9 % Inject 10 mg/kg into the vein every 30 (thirty) days. Infuse every 4 weeks  . loperamide (IMODIUM) 2 MG capsule Take 1 capsule (2 mg  total) by mouth 4 (four) times daily as needed for diarrhea or loose stools.  . meloxicam (MOBIC) 15 MG tablet Take 15 mg by mouth daily. (Patient not taking: Reported on 04/10/2020)  . methotrexate (50 MG/ML) 1 g injection Inject into the vein every Monday. 0.14m every Monday  . olmesartan (BENICAR) 20 MG tablet Take 1 tablet (20 mg total) by mouth daily.  . ondansetron (ZOFRAN) 4 MG tablet Take 4 mg by mouth every 6 (six) hours as needed for nausea or vomiting.  . temazepam (RESTORIL) 7.5 MG capsule Take 1 capsule (7.5 mg total) by mouth at bedtime as needed for sleep.  .[name] (ZANAFLEX) 2 MG tablet Take 2 mg by mouth 3 (three) times daily.  . traMADol (ULTRAM) 50 MG tablet Take 50 mg by mouth every 6 (six) hours as needed for moderate pain.    No facility-administered encounter medications on file as of 05/29/2020.    Current Diagnosis/Assessment:    Goals Addressed   None    Hypertension   BP goal is:  <140/90  Office blood pressures are  BP Readings from Last 3 Encounters:  02/26/20 (!) 132/94  02/12/20 138/87  06/04/19 120/60   Patient checks BP at home 1-2x per week Patient home BP readings are ranging:   Patient has failed these meds in the past: Losartan Patient is currently controlled on the following medications:  . Olmesartan 274mdaily  We discussed:  Advised pt to alternate times of the day when she is checking her BP Exercise extensively Recommend 30 minutes of exercise 5 times weekly  Plan Continue current medications   Ulcerative Colitis   Patient has failed these meds in past: Prednisone, Humira, XeMorrie Sheldonmesalamine Patient is currently uncontrolled on the following medications:   Methotrexate 1g injection every week  Folic acid 30m22maily  Infliximab (Renflexis) 100m44mfusion every 4 weeks  We discussed:   Pt had an appointment with Dr. HawkTrudie Reed8 and discussed possibility of switching back to Remicade  Doctor's office is supposed to  be looking into this for patient  Advised pt that I could help with patient assistance application for Remicade if needed  Discussed with pt that I spoke with JohnWynetta Emery JohnWynetta Emeryient assistance foundation representative, [place] 04/10/20 who states that patient was denied patient assistance in September 2018 due to not meeting out of pocket spending requirement Plan Continue current medications  Assist patient with completing patient assistance application for Remicade if needed  Insomnia   Patient has failed these meds in past: Alprazolam, zolpidem Patient is currently uncontrolled on the following medications:   Temazepam 7.5mg 79mbedtime as needed for sleep  Amitriptyline 25mg 330medtime  We discussed:    Pt has been referred to therapist by PCP, awaiting scheduling  Pt received amitriptyline from local pharmacy this month (sent there instead of [facility]). Will get from [facility] next month  Plan Continue current medications   Neuropathy   Patient has failed these meds in past: Lyrica Patient is currently controlled on the following medications:   Gabapentin 300mg 255msules 3 times daily  We discussed:  She states that her doctor told her she could take 4 capsules at bedtime, instead of throughout the day  Plan Continue current medications  Contact  prescribing doctor regarding dosing clarification for gabapentin  Pain   Patient has failed these meds in past: Hydrocodone/APAP, meloxicam, morphine, naproxen Patient is currently controlled on the following medications:   Celecoxib 232m twice daily  Tizanidine 25mthree times daily as needed  Tramadol 5071mvery 6 hours as needed for moderate pain  We discussed:  Pt states that Celebrex is hurting stomach  Recommend pt try Voltaren gel for arthritis pain, available over the counter   Plan Continue current medications   Irritable Bowel Syndrome   Patient has failed these meds in past: Hyoscyamine,  Viberzi Patient is currently controlled on the following medications:   Dicyclomine 31m70m bedtime  We discussed:    Pt states that she takes dicyclomine up to four times daily as needed  Plan Recommend discontinuing dicyclomine due to ulcerative colitis and reports of diarrhea  Diarrhea   Patient has failed these meds in past: N/A Patient is currently controlled on the following medications:   Loperamide 2mg 59mimes daily as needed for loose stools or diarrhea  Plan Continue current medications   Health Maintenance   Patient is currently on the following medications:  . Cetirizine 10mg 75my . Acetaminophen 500mg .63mansetron 4mg eve60m6 hours as needed for nausea or vomiting . Ventolin HFA as needed for shortness of breath/coughing during asthma flare   Plan Continue current medications   Vaccines   Reviewed and discussed patient's vaccination history.  No [facility] records  Immunization History  Administered Date(s) Administered  . Influenza,inj,Quad PF,6+ Mos 06/04/2019  . Influenza-Unspecified 08/09/2018  . Moderna SARS-COVID-2 Vaccination 11/13/2019, 12/11/2019   Pt received Tdap vaccine 05/2019 at local pharmacy  Plan No current recommendations at this time  Medication Management   Pt uses [facility] medications Uses pill box? No - Unknown Pt endorses 99% compliance  We discussed:  . Importance of taking each medications daily as directed . Medication synchronization, adherence packaging, and delivery available with [facility] pharmacy . Pt states that her mother is interested in using [facility] pharmacy, will coordinate  Plan Utilize [facility] pharmacy for medication synchronization, packaging and delivery  Follow up: 2 month phone visit  CourtneyJannette Fogo Clinical Pharmacist [facility] [phone]

## 2020-05-31 ENCOUNTER — Emergency Department (HOSPITAL_COMMUNITY)
Admission: EM | Admit: 2020-05-31 | Discharge: 2020-05-31 | Disposition: A | Discharge status: Home / Self Care | Payer: Medicare HMO | Attending: Emergency Medicine | Admitting: Emergency Medicine

## 2020-05-31 ENCOUNTER — Other Ambulatory Visit: Payer: Self-pay

## 2020-05-31 ENCOUNTER — Encounter (HOSPITAL_COMMUNITY): Payer: Self-pay | Admitting: Emergency Medicine

## 2020-05-31 DIAGNOSIS — J45909 Unspecified asthma, uncomplicated: Secondary | ICD-10-CM | POA: Diagnosis not present

## 2020-05-31 DIAGNOSIS — Z853 Personal history of malignant neoplasm of breast: Secondary | ICD-10-CM | POA: Insufficient documentation

## 2020-05-31 DIAGNOSIS — M79604 Pain in right leg: Secondary | ICD-10-CM | POA: Diagnosis not present

## 2020-05-31 DIAGNOSIS — I10 Essential (primary) hypertension: Secondary | ICD-10-CM | POA: Diagnosis not present

## 2020-05-31 DIAGNOSIS — Z79899 Other long term (current) drug therapy: Secondary | ICD-10-CM | POA: Diagnosis not present

## 2020-05-31 DIAGNOSIS — G8929 Other chronic pain: Secondary | ICD-10-CM | POA: Insufficient documentation

## 2020-05-31 DIAGNOSIS — M5441 Lumbago with sciatica, right side: Secondary | ICD-10-CM

## 2020-05-31 DIAGNOSIS — M545 Low back pain: Secondary | ICD-10-CM | POA: Diagnosis not present

## 2020-05-31 DIAGNOSIS — M549 Dorsalgia, unspecified: Secondary | ICD-10-CM | POA: Diagnosis present

## 2020-05-31 MED ORDER — HYDROMORPHONE HCL 1 MG/ML IJ SOLN
1.0000 mg | Freq: Once | INTRAMUSCULAR | Status: AC
  Administered 2020-05-31: 1 mg via INTRAMUSCULAR
  Filled 2020-05-31: qty 1

## 2020-05-31 MED ORDER — HYDROCODONE-ACETAMINOPHEN 7.5-325 MG PO TABS: 1.0000 | ORAL_TABLET | Freq: Four times a day (QID) | ORAL | 0 refills | Status: DC | PRN

## 2020-05-31 NOTE — Discharge Instructions (Addendum)
You may alternate ice and heat to your lower back. Stop the tramadol while taking the hydrocodone. You may continue to take your time tizanidine as directed. Follow-up with your spine specialist this week.

## 2020-05-31 NOTE — ED Triage Notes (Signed)
Pt c/o lower back pain radiating down RT leg "for a few days." Pt states pain has gotten worse. Pt tearful. Denies injury. Hx of sciatica.

## 2020-05-31 NOTE — ED Provider Notes (Signed)
[facility] Provider Note   CSN: [id number] Arrival date & time: 05/31/20  1143     History Chief Complaint  Patient presents with  . Back Pain  . Leg Pain    Gabrielle Hawkins is a 54 y.o. female.  HPI     Gabrielle Hawkins is a 54 y.o. female with past medical history of Crohn's disease, anemia, anxiety, asthma, and chronic back pain.  She presents to the Emergency Department complaining of right low back pain gradually worsening for 5 days.  She reports history of the same and had a epidural injection one month ago and reports doing well until 5 days ago.  She describes a sharp pain from her right lower back into her buttocks, hip and down her right leg.  She has intermittent episodes of tingling any pins-and-needles sensation into her right leg to the level of her foot.  Symptoms are worse with lying supine and with weightbearing.  Symptoms improved lying on her left side in the fetal position.  She has tried over-the-counter pain relievers without relief.  She is scheduled to have a second injection and seen at the [facility].  She denies abdominal pain, fever, chills, urine or bowel changes, and weakness of her extremities. No recent injury  Past Medical History:  Diagnosis Date  . Anemia 1998   . Anxiety   . Arthritis    big toe   . Asthma   . BRCA1 negative   . BRCA2 negative   . Breast cancer ([place]) 2006   chem/radiation/2years tamoxifen  . Chronic neck and back pain   . Crohn's disease ([place])   . Depression   . Endometriosis   . Fibroid   . GERD (gastroesophageal reflux disease)   . History of chemotherapy 01/2005  . Hx of tamoxifen therapy   . Hx of transfusion of packed red blood cells   . IBS (irritable bowel syndrome)   . Kidney stones   . Lymphedema of arm   . Migraines    just at dx of breast CA  . Mucinous cystadenoma of ovary    left  . Neuropathy   . Paresthesias   . Peripheral neuropathy   . Radiation  04/2005  . Stroke [facility])    related to when taken compazine  . Ulcerative colitis ([place])    with chronic diarrhea  . Urinary tract infection    x 3  . Vertigo 2014  . Vitamin D deficiency     Patient Active Problem List   Diagnosis Date Noted  . Right-sided low back pain with right-sided sciatica 05/06/2019  . Right sided numbness 03/27/2019  . Paresthesia 12/25/2018  . Essential hypertension 06/26/2018  . Abdominal pain, chronic, right lower quadrant 01/06/2015  . Nausea and vomiting 01/05/2015  . Ulcerative colitis ([place]) 01/05/2015  . Enteritis   . Nausea with vomiting   . Intractable nausea and vomiting 04/29/2014  . Benign paroxysmal positional vertigo 03/14/2013  . Lymphedema of arm 12/30/2011    Past Surgical History:  Procedure Laterality Date  . ABDOMINAL HYSTERECTOMY  2007   TAH/BSO  . BREAST SURGERY  2006   LUMPECTOMY  . CHOLECYSTECTOMY    . CHOLECYSTECTOMY N/A 04/29/2014   Procedure: LAPAROSCOPIC CHOLECYSTECTOMY WITH INTRAOPERATIVE CHOLANGIOGRAM;  Surgeon: Edward Jolly, MD;  Location: [facility] ORS;  Service: General;  Laterality: N/A;  . COLONOSCOPY  June 2013   Dr. Benson Norway: sessile serrated adenoma  . COLONOSCOPY WITH PROPOFOL N/A 03/13/2015  Procedure: COLONOSCOPY WITH PROPOFOL;  Surgeon: [name], MD;  Location: [facility] END[facility];  Service: Endoscopy;  Laterality: N/A;  . DILATION AND CURETTAGE OF UTERUS    . FLEXIBLE SIGMOIDOSCOPY N/A 07/18/2014   Dr. Benson Norway: mild left-sided colitis, biopsies with quiescent UC  . MYOMECTOMY    . TONSILLECTOMY       OB History    Gravida  1   Para  0   Term      Preterm      AB  1   Living  0     SAB  1   TAB      Ectopic      Multiple      Live Births              Family History  Problem Relation Age of Onset  . Breast cancer Mother 50  . Heart failure Father   . Heart disease Father   . Cancer Maternal Grandmother        COLON  . Heart failure Paternal Grandmother   . Heart disease Paternal  Grandmother     Social History   Tobacco Use  . Smoking status: Never Smoker  . Smokeless tobacco: Never Used  Vaping Use  . Vaping Use: Never assessed  Substance Use Topics  . Alcohol use: No  . Drug use: No    Home Medications Prior to Admission medications   Medication Sig Start Date End Date Taking? Authorizing Provider  albuterol (VENTOLIN HFA) 108 (90 Base) MCG/ACT inhaler Inhale 2 puffs into the lungs every 6 (six) hours as needed for wheezing or shortness of breath. 04/13/20   [name], FNP  amitriptyline (ELAVIL) 25 MG tablet Take 25 mg by mouth at bedtime.  05/20/15   [provider]  beclomethasone (QVAR) 80 MCG/ACT inhaler Inhale 1 puff into the lungs daily as needed (Shortness of Breath). Patient not taking: Reported on 04/10/2020    [provider]  celecoxib (CELEBREX) 200 MG capsule Take 200 mg by mouth 2 (two) times daily.     [provider]  cetirizine (ZYRTEC) 10 MG tablet Take 10 mg by mouth daily.    [provider]  dicyclomine (BENTYL) 20 MG tablet Take 20 mg by mouth 4 (four) times daily -  before meals and at bedtime. Up to four times daily as needed    [provider]  diphenhydrAMINE (BENADRYL) 25 mg capsule Take 25 mg by mouth daily as needed for itching. Patient not taking: Reported on 04/10/2020    [provider]  folic acid (FOLVITE) 1 MG tablet Take 3 mg by mouth daily.     [provider]  gabapentin (NEURONTIN) 300 MG capsule Take 2 capsules (600 mg total) by mouth 3 (three) times daily. 04/21/20   Suzzanne Cloud, NP  inFLIXimab (REMICADE) 100 MG injection Inject 100 mg into the vein every 30 (thirty) days. Infuse by intravenous route every 4 weeks  Patient is receiving Inflectra brand verses Remicade brand due to insurance Patient not taking: Reported on 04/10/2020    [provider]  inFLIXimab in sodium chloride 0.9 % Inject 10 mg/kg into the vein every 30 (thirty) days. Infuse  every 4 weeks    [provider]  loperamide (IMODIUM) 2 MG capsule Take 1 capsule (2 mg total) by mouth 4 (four) times daily as needed for diarrhea or loose stools. 04/13/20   [name], FNP  meloxicam (MOBIC) 15 MG tablet Take 15 mg  by mouth daily. Patient not taking: Reported on 04/10/2020    [provider]  methotrexate (50 MG/ML) 1 g injection Inject into the vein every Monday. 0.12m every Monday    [provider]  olmesartan (BENICAR) 20 MG tablet Take 1 tablet (20 mg total) by mouth daily. 05/25/20   MMinette Brine FNP  ondansetron (ZOFRAN) 4 MG tablet Take 4 mg by mouth every 6 (six) hours as needed for nausea or vomiting.    [provider]  temazepam (RESTORIL) 7.5 MG capsule Take 1 capsule (7.5 mg total) by mouth at bedtime as needed for sleep. 04/13/20   MMinette Brine FNP  tiZANidine (ZANAFLEX) 2 MG tablet Take 2 mg by mouth 3 (three) times daily.    [provider]  traMADol (ULTRAM) 50 MG tablet Take 50 mg by mouth every 6 (six) hours as needed for moderate pain.     [provider]    Allergies    Humira [adalimumab], Ampicillin, Codeine, Compazine [prochlorperazine maleate], Cymbalta [duloxetine hcl], Imitrex [sumatriptan base], Lyrica [pregabalin], Metoclopramide hcl, Percocet [oxycodone-acetaminophen], Effexor [venlafaxine hydrochloride], and Tape  Review of Systems   Review of Systems  Constitutional: Negative for fever.  Respiratory: Negative for shortness of breath.   Cardiovascular: Negative for chest pain and leg swelling.  Gastrointestinal: Negative for abdominal pain, constipation, diarrhea, nausea and vomiting.  Genitourinary: Negative for decreased urine volume, difficulty urinating, dysuria, flank pain and hematuria.  Musculoskeletal: Positive for back pain. Negative for joint swelling.  Skin: Negative for rash.  Neurological: Negative for dizziness, speech difficulty, weakness and numbness.    Physical  Exam Updated Vital Signs BP 137/86 (BP Location: Right Arm)   Pulse 93   Temp 98.3 F (36.8 C) (Oral)   Resp 18   Ht 5' 8"  (1.727 m)   Wt 84.8 kg   SpO2 100%   BMI 28.43 kg/m   Physical Exam Vitals and nursing note reviewed.  Constitutional:      General: She is not in acute distress.    Appearance: Normal appearance. She is well-developed. She is not ill-appearing or toxic-appearing.  Cardiovascular:     Rate and Rhythm: Normal rate and regular rhythm.     Pulses: Normal pulses.     Comments: DP pulses are strong and palpable bilaterally Pulmonary:     Effort: Pulmonary effort is normal. No respiratory distress.     Breath sounds: Normal breath sounds.  Abdominal:     General: There is no distension.     Palpations: Abdomen is soft.     Tenderness: There is no abdominal tenderness. There is no right CVA tenderness or left CVA tenderness.  Musculoskeletal:        General: Tenderness present. No signs of injury. Normal range of motion.     Lumbar back: Tenderness present. No swelling, deformity or lacerations. Normal range of motion.     Comments: ttp of the lower lumbar spine and right paraspinal muscles.  Pt has 5/5 strength against resistance of bilateral lower extremities.     Skin:    General: Skin is warm.     Capillary Refill: Capillary refill takes less than 2 seconds.     Findings: No rash.  Neurological:     Mental Status: She is alert and oriented to person, place, and time.     Sensory: No sensory deficit.     Motor: No abnormal muscle tone.     Coordination: Coordination normal.     Gait: Gait normal.  Deep Tendon Reflexes:     Reflex Scores:      Patellar reflexes are 2+ on the right side and 2+ on the left side.      Achilles reflexes are 2+ on the right side and 2+ on the left side.    ED Results / Procedures / Treatments   Labs (all labs ordered are listed, but only abnormal results are displayed) Labs Reviewed - No data to  display  EKG None  Radiology No results found.  Procedures Procedures (including critical care time)  Medications Ordered in ED Medications  HYDROmorphone (DILAUDID) injection 1 mg (has no administration in time range)    ED Course  I have reviewed the triage vital signs and the nursing notes.  Pertinent labs & imaging results that were available during my care of the patient were reviewed by me and considered in my medical decision making (see chart for details).    MDM Rules/Calculators/A&P                          Pt with likely acute on chronic low back pain.  Epidural was one month ago per patient, she is well appearing and non toxic.  I do not feel this is related to spinal abscess, no concerning sx's for cauda equina.    She had a MRI of the L spine in June that showed stable hemangiomas noted at T12, L5 and S1 levels; no abnormal enhancement.  - Mild degenerative changes from L1-2 to L4-5; no spinal stenosis or foraminal narrowing  No recent injury to suggest need for repeat imaging, vitals reassuring.  I feel that she is appropriate for d/c home.  I will provide rx for short course of pain medication and she agrees to f/u with her spine specialist this week.    On recheck, pt reports feeling better and she has ambulated to the restroom with a slow but steady gait.     Final Clinical Impression(s) / ED Diagnoses Final diagnoses:  None    Rx / DC Orders ED Discharge Orders    None       Kem Parkinson, PA-C 05/31/20 1613    Isla Pence, MD 06/04/20 [phone]

## 2020-06-03 ENCOUNTER — Telehealth: Payer: Medicare HMO

## 2020-06-03 ENCOUNTER — Telehealth: Payer: Self-pay

## 2020-06-03 NOTE — Telephone Encounter (Signed)
  Chronic Care Management   Outreach Note  06/03/2020 Name: Gabrielle Hawkins MRN: [id number] DOB: [birth year]  Referred by: [name], FNP Reason for referral : Care Coordination   An unsuccessful telephone outreach was attempted today. The patient was referred to the case management team for assistance with care management and care coordination.   Follow Up Plan: A HIPAA compliant phone message was left for the patient providing contact information and requesting a return call.  The care management team will reach out to the patient again over the next 14 days.   [name], BSW, CDP Social Worker, Certified Dementia Practitioner [place] [facility] [phone]

## 2020-06-04 ENCOUNTER — Ambulatory Visit: Payer: Self-pay

## 2020-06-04 ENCOUNTER — Telehealth: Payer: Medicare HMO

## 2020-06-04 ENCOUNTER — Other Ambulatory Visit: Payer: Self-pay

## 2020-06-04 DIAGNOSIS — I10 Essential (primary) hypertension: Secondary | ICD-10-CM

## 2020-06-04 DIAGNOSIS — K5180 Other ulcerative colitis without complications: Secondary | ICD-10-CM

## 2020-06-08 NOTE — Chronic Care Management (AMB) (Signed)
Chronic Care Management   Follow Up Note   06/04/2020 Name: Gabrielle Hawkins MRN: [id number] DOB: Jan 31, 1966  Referred by: [name], FNP Reason for referral : Chronic Care Management (CCM RN CM FU Call)   Gabrielle Hawkins is a 54 y.o. year old female who is a primary care patient of [name], [facility]. The CCM team was consulted for assistance with chronic disease management and care coordination needs.    Review of patient status, including review of consultants reports, relevant laboratory and other test results, and collaboration with appropriate care team members and the patient's provider was performed as part of comprehensive patient evaluation and provision of chronic care management services.    SDOH (Social Determinants of Health) assessments performed: Yes See Care Plan activities for detailed interventions related to [place])   Placed outbound CCM RN CM follow up call to patient for a care plan update.    Outpatient Encounter Medications as of 06/04/2020  Medication Sig  . albuterol (VENTOLIN HFA) 108 (90 Base) MCG/ACT inhaler Inhale 2 puffs into the lungs every 6 (six) hours as needed for wheezing or shortness of breath.  [name] amitriptyline (ELAVIL) 25 MG tablet Take 25 mg by mouth at bedtime.   . beclomethasone (QVAR) 80 MCG/ACT inhaler Inhale 1 puff into the lungs daily as needed (Shortness of Breath). (Patient not taking: Reported on 04/10/2020)  . celecoxib (CELEBREX) 200 MG capsule Take 200 mg by mouth 2 (two) times daily.   . cetirizine (ZYRTEC) 10 MG tablet Take 10 mg by mouth daily.  [name] dicyclomine (BENTYL) 20 MG tablet Take 20 mg by mouth 4 (four) times daily -  before meals and at bedtime. Up to four times daily as needed  . diphenhydrAMINE (BENADRYL) 25 mg capsule Take 25 mg by mouth daily as needed for itching. (Patient not taking: Reported on 04/10/2020)  . folic acid (FOLVITE) 1 MG tablet Take 3 mg by mouth daily.   [name] gabapentin (NEURONTIN) 300 MG capsule Take  2 capsules (600 mg total) by mouth 3 (three) times daily.  [name] HYDROcodone-acetaminophen (NORCO) 7.5-325 MG tablet Take 1 tablet by mouth every 6 (six) hours as needed for moderate pain.  [name] inFLIXimab (REMICADE) 100 MG injection Inject 100 mg into the vein every 30 (thirty) days. Infuse by intravenous route every 4 weeks  Patient is receiving Inflectra brand verses Remicade brand due to insurance (Patient not taking: Reported on 04/10/2020)  . inFLIXimab in sodium chloride 0.9 % Inject 10 mg/kg into the vein every 30 (thirty) days. Infuse every 4 weeks  . loperamide (IMODIUM) 2 MG capsule Take 1 capsule (2 mg total) by mouth 4 (four) times daily as needed for diarrhea or loose stools.  . meloxicam (MOBIC) 15 MG tablet Take 15 mg by mouth daily. (Patient not taking: Reported on 04/10/2020)  . methotrexate (50 MG/ML) 1 g injection Inject into the vein every Monday. 0.59m every Monday  . olmesartan (BENICAR) 20 MG tablet Take 1 tablet (20 mg total) by mouth daily.  . ondansetron (ZOFRAN) 4 MG tablet Take 4 mg by mouth every 6 (six) hours as needed for nausea or vomiting.  . temazepam (RESTORIL) 7.5 MG capsule Take 1 capsule (7.5 mg total) by mouth at bedtime as needed for sleep.  .[name] (ZANAFLEX) 2 MG tablet Take 2 mg by mouth 3 (three) times daily.   No facility-administered encounter medications on file as of 06/04/2020.     Objective:  No results found for: HGBA1C Lab Results  Component Value Date   MICROALBUR 10 06/04/2019   LDLCALC 124 (H) 12/24/2009   CREATININE 0.89 02/18/2020   BP Readings from Last 3 Encounters:  05/31/20 (!) 111/59  02/26/20 (!) 132/94  02/12/20 138/87    Goals Addressed      Patient Stated   .  "to get my back pain and leg numbness under control" (pt-stated)        CARE PLAN ENTRY (see longitudinal plan of care for additional care plan information)  Current Barriers:  [name] Knowledge Deficits related to treatment and evaluation of right-sided low back pain  with right-sided sciatica . Chronic Disease Management support and education needs related to Essential hypertension, Ulcerative Colitis w/o complication, right-sided low back pain with right-sided sciatica   Nurse Case Manager Clinical Goal(s):  [name] Over the next 30 days, patient will verbalize understanding of plan for diagnosis and treatment of right-sided low back pain  . Over the next 90 days, patient will work with the CCM team and PCP to address needs related to disease education and support to improve Self Health management of right-sided back pain with right-sided sciatica  CCM RN CM Interventions:  06/04/20 call completed with patient  . Inter-disciplinary care team collaboration (see longitudinal plan of care) . Evaluation of current treatment plan related to right-sided back pain and patient's adherence to plan as established by provider. . Reviewed medications with patient and discussed current pharmacological treatment regimen for pain management: o Tramadol 50 mg q 6 hrs as needed for pain o Gabapentin 300 mg (take 2 capsules 600 mg tid) . Determined patient experienced worsening back pain with Sciatica involvement with the following Assessment/Plan discussed:  o ASSESSMENT AND PLAN o Pt with likely acute on chronic low back pain.  Epidural was one month ago per patient, she is well appearing and non toxic.  I do not feel this is related to spinal abscess, no concerning sx's for cauda equina.   o She had a MRI of the L spine in June that showed stable hemangiomas noted at T12, L5 and S1 levels; no abnormal enhancement.  o - Mild degenerative changes from L1-2 to L4-5; no spinal stenosis or foraminal narrowing o No recent injury to suggest need for repeat imaging, vitals reassuring.  I feel that she is appropriate for d/c home.  I will provide rx for short course of pain medication and she agrees to f/u with her spine specialist this week.   o On recheck, pt reports feeling better and  she has ambulated to the restroom with a slow but steady gait.   o     . Determined patient is feeling better today, although, she continues to have chronic severe pain . Re-educated patient on alternative treatments for chronic back pain including PT and Dry Needling . Reviewed scheduled/upcoming provider appointments including:  Spine & Scoliosis, Dr. Maia Petties MD to determine ongoing recommendations for treatment of her pain and associated symptoms . Encouraged patient to discuss being evaluated and treated with this type of treatment at next f/u with Dr. Maia Petties MD . Printed mailed educational materials related to "Dry Needling" to patient for review and discussion with MD  . Discussed plans with patient for ongoing care management follow up and provided patient with direct contact information for care management team  Patient Self Care Activities:  . Self administers medications as prescribed . Attends all scheduled provider appointments . Calls pharmacy for medication refills . Performs ADL's independently . Performs IADL's independently .  Calls provider office for new concerns or questions  Please see past updates related to this goal by clicking on the "Past Updates" button in the selected goal        Plan:   Telephone follow up appointment with care management team member scheduled for:07/08/20  Barb Merino, RN, BSN, CCM Care Management Coordinator [facility]  Direct Phone: [phone]

## 2020-06-08 NOTE — Patient Instructions (Addendum)
Visit Information  Goals Addressed      Patient Stated   .  "to get my back pain and leg numbness under control" (pt-stated)        CARE PLAN ENTRY (see longitudinal plan of care for additional care plan information)  Current Barriers:  [name] Knowledge Deficits related to treatment and evaluation of right-sided low back pain with right-sided sciatica . Chronic Disease Management support and education needs related to Essential hypertension, Ulcerative Colitis w/o complication, right-sided low back pain with right-sided sciatica   Nurse Case Manager Clinical Goal(s):  [name] Over the next 30 days, patient will verbalize understanding of plan for diagnosis and treatment of right-sided low back pain  . Over the next 90 days, patient will work with the CCM team and PCP to address needs related to disease education and support to improve Self Health management of right-sided back pain with right-sided sciatica  CCM RN CM Interventions:  06/04/20 call completed with patient  . Inter-disciplinary care team collaboration (see longitudinal plan of care) . Evaluation of current treatment plan related to right-sided back pain and patient's adherence to plan as established by provider. . Reviewed medications with patient and discussed current pharmacological treatment regimen for pain management: o Tramadol 50 mg q 6 hrs as needed for pain o Gabapentin 300 mg (take 2 capsules 600 mg tid) . Determined patient experienced worsening back pain with Sciatica involvement with the following Assessment/Plan discussed:  o ASSESSMENT AND PLAN o Pt with likely acute on chronic low back pain.  Epidural was one month ago per patient, she is well appearing and non toxic.  I do not feel this is related to spinal abscess, no concerning sx's for cauda equina.   o She had a MRI of the L spine in June that showed stable hemangiomas noted at T12, L5 and S1 levels; no abnormal enhancement.  o - Mild degenerative changes from L1-2  to L4-5; no spinal stenosis or foraminal narrowing o No recent injury to suggest need for repeat imaging, vitals reassuring.  I feel that she is appropriate for d/c home.  I will provide rx for short course of pain medication and she agrees to f/u with her spine specialist this week.   o On recheck, pt reports feeling better and she has ambulated to the restroom with a slow but steady gait.   o     . Determined patient is feeling better today, although, she continues to have chronic severe pain . Re-educated patient on alternative treatments for chronic back pain including PT and Dry Needling . Reviewed scheduled/upcoming provider appointments including:  Spine & Scoliosis, Dr. Maia Petties MD to determine ongoing recommendations for treatment of her pain and associated symptoms . Encouraged patient to discuss being evaluated and treated with this type of treatment at next f/u with Dr. Maia Petties MD . Printed mailed educational materials related to "Dry Needling" to patient for review and discussion with MD  . Discussed plans with patient for ongoing care management follow up and provided patient with direct contact information for care management team  Patient Self Care Activities:  . Self administers medications as prescribed . Attends all scheduled provider appointments . Calls pharmacy for medication refills . Performs ADL's independently . Performs IADL's independently . Calls provider office for new concerns or questions  Please see past updates related to this goal by clicking on the "Past Updates" button in the selected goal         Patient verbalizes understanding  of instructions provided today.   Telephone follow up appointment with care management team member scheduled for: 07/08/20  Barb Merino, RN, BSN, CCM Care Management Coordinator [place] [facility]  Direct Phone: [phone]

## 2020-06-11 ENCOUNTER — Encounter: Payer: Medicare HMO | Admitting: Nurse Practitioner

## 2020-06-11 ENCOUNTER — Ambulatory Visit: Payer: Medicare HMO

## 2020-06-16 ENCOUNTER — Ambulatory Visit: Payer: Medicare Other

## 2020-06-16 DIAGNOSIS — G8929 Other chronic pain: Secondary | ICD-10-CM

## 2020-06-16 DIAGNOSIS — I10 Essential (primary) hypertension: Secondary | ICD-10-CM

## 2020-06-16 NOTE — Chronic Care Management (AMB) (Signed)
Chronic Care Management    Social Work Follow Up Note  06/16/2020 Name: Gabrielle Hawkins MRN: [id number] DOB: April 11, 1966  Gabrielle Hawkins is a 54 y.o. year old female who is a primary care patient of [name], [place]. The CCM team was consulted for assistance with care coordination.   Review of patient status, including review of consultants reports, other relevant assessments, and collaboration with appropriate care team members and the patient's provider was performed as part of comprehensive patient evaluation and provision of chronic care management services.    SDOH (Social Determinants of Health) assessments performed: No    Outpatient Encounter Medications as of 06/16/2020  Medication Sig  . albuterol (VENTOLIN HFA) 108 (90 Base) MCG/ACT inhaler Inhale 2 puffs into the lungs every 6 (six) hours as needed for wheezing or shortness of breath.  [name] amitriptyline (ELAVIL) 25 MG tablet Take 25 mg by mouth at bedtime.   . beclomethasone (QVAR) 80 MCG/ACT inhaler Inhale 1 puff into the lungs daily as needed (Shortness of Breath). (Patient not taking: Reported on 04/10/2020)  . celecoxib (CELEBREX) 200 MG capsule Take 200 mg by mouth 2 (two) times daily.   . cetirizine (ZYRTEC) 10 MG tablet Take 10 mg by mouth daily.  [name] dicyclomine (BENTYL) 20 MG tablet Take 20 mg by mouth 4 (four) times daily -  before meals and at bedtime. Up to four times daily as needed  . diphenhydrAMINE (BENADRYL) 25 mg capsule Take 25 mg by mouth daily as needed for itching. (Patient not taking: Reported on 04/10/2020)  . folic acid (FOLVITE) 1 MG tablet Take 3 mg by mouth daily.   [name] gabapentin (NEURONTIN) 300 MG capsule Take 2 capsules (600 mg total) by mouth 3 (three) times daily.  [name] HYDROcodone-acetaminophen (NORCO) 7.5-325 MG tablet Take 1 tablet by mouth every 6 (six) hours as needed for moderate pain.  [name] inFLIXimab (REMICADE) 100 MG injection Inject 100 mg into the vein every 30 (thirty) days. Infuse by  intravenous route every 4 weeks  Patient is receiving Inflectra brand verses Remicade brand due to insurance (Patient not taking: Reported on 04/10/2020)  . inFLIXimab in sodium chloride 0.9 % Inject 10 mg/kg into the vein every 30 (thirty) days. Infuse every 4 weeks  . loperamide (IMODIUM) 2 MG capsule Take 1 capsule (2 mg total) by mouth 4 (four) times daily as needed for diarrhea or loose stools.  . meloxicam (MOBIC) 15 MG tablet Take 15 mg by mouth daily. (Patient not taking: Reported on 04/10/2020)  . methotrexate (50 MG/ML) 1 g injection Inject into the vein every Monday. 0.24m every Monday  . olmesartan (BENICAR) 20 MG tablet Take 1 tablet (20 mg total) by mouth daily.  . ondansetron (ZOFRAN) 4 MG tablet Take 4 mg by mouth every 6 (six) hours as needed for nausea or vomiting.  . temazepam (RESTORIL) 7.5 MG capsule Take 1 capsule (7.5 mg total) by mouth at bedtime as needed for sleep.  .[name] (ZANAFLEX) 2 MG tablet Take 2 mg by mouth 3 (three) times daily.   No facility-administered encounter medications on file as of 06/16/2020.     Goals Addressed              This Visit's Progress     Patient Stated   .  "I got a letter they are denying me for disability" (pt-stated)        CARE PLAN ENTRY (see longitudinal plan of care for additional care plan information)  Current Barriers:  .  Chronic conditions including HTN, Ulcerative Colitis, and Crohn's disease which impact patients ability to work . Unknown reason for decision to decline further disability payment after case review  Social Work Clinical Goal(s):  [name] Over the next 5 days the patient will work with a Media planner to initiate appeal process Goal Met . New 05/06/20 Over the next 60 days the patient will work with [place] to complete appeals process regarding Disability review   CCM SW Interventions: Completed 06/16/20 . Successful outbound call placed to the patient to assess goal  progression . Determined the patient is no longer pursuing Disability appeal due to concern surrounding lawyer fees . 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 Almyra Free is concerned she may not be able to perform full-time job duties . Discussed patient concern over possibility of needing back surgery o "Dr. Eugenio Hoes says he does not think dry needling or therapy will help" o Patient reports plans to see Dr. Jon Billings tomorrow regarding possibility of upcoming surgery due to back pain . Collaboration with [place] regarding patient plans to return to work and possibility of back surgery being needed in the near future . Scheduled follow up call to the patient over the next month   Completed 05/28/20 . Successful outbound call placed to the patient to assess goal progression . Determined the patient has been in contact with Mrs. Glenford Peers with [place] to determine Mrs. Glenford Peers has yet to receive a response from Disability regarding patient appeal . Discussed the patient is actively looking for jobs she may be successful at due to concern with finances . Advised patient to contact SW once she receives a response from disability regarding appeal status . Scheduled follow up call over the next month  Completed 05/18/20 . Successful outbound call placed to the patient to assess goal progression . Determined the patient just ended a meeting with the [place] who plans to work with the patient in the appeals process . Discussed the patient is beginning to face challenges associated with funds to afford food and utility costs . Advised the patient to contact her local DSS to request financial assistance  . Scheduled  follow up call over the next week  Completed 05/13/20 . Received inbound call from the patient who reports her disability benefits have now stopped and she will lose her health insurance after this month o The patient is very concerned with losing insurance due to not being able to continue infusion treatments o The patient reports her income has now stopped without notice or response from "reconsideration" the patient submitted . Determined the patient was contacted by the Disability Case Reviewer who was unable to communicate why the patients benefits were stopped so soon without notice. The reviewer was also unable to confirm the reconsideration was received or how long it would take for the reconsideration to take place . Provided the patient with the number to the [facility] [employer] [facility] in [place] to discuss needs . Collaboration with the patients primary provider and RN Care Manager to provide an update on goal progression  Completed 05/06/20 . Successful outbound call placed to the patient to assess goal progression . Determined the patient was contacted by the lawyer  who informed the patient she is unable to assist with this part of the process . Discussed the patients letter stated they reviewer "feels the patient has improved enough to do light work" o Patient reports she spoke with Dr. Benson Norway who wrote a more detailed letter entailing why he would not release the patient to return to work . Determined  the patient has submitted paperwork for a "reconsideration" of the review o Paperwork includes letters from Dr. Benson Norway, the patient, her mother, sister, and cousin describing patient disability and inability to work o The patient is awaiting a call to schedule a review of the reconsideration o If the reconsideration is not approved, the patient will have 60 days to formally appeal the reviewers decision . Scheduled follow up call to the patient over the next two weeks o Advised the  patient to contact SW sooner as needed  Completed 05/05/20 . Inter-disciplinary care team collaboration (see longitudinal plan of care) . Successful outbound call placed to the patient to assist with care coordination needs . Determined the patient received a letter stating her case review resulted in a denial of disability benefits . Discussed patient opportunity to appeal the denial o Patient reported she has tried to contact Disability office x 2 with both calls being cut off while holding for assistance . Determined the patient has contacted a lawyer office and spoken to the [place] regarding denial o Patient has provided a copy of the disability denial letter and is awaiting a return call from the lawyer . Advised the patient to contact the lawyer again if she does not receive a return call in the next 24 hours . Encouraged the patient for the steps she has taken to advocate for herself . Scheduled a follow up call for 05/06/20 to assess goal progression . Collaboration with RN Care Manager to inform of goal and plan to assist with care coordination surrounding disability denial  Patient Self Care Activities:  . Patient verbalizes understanding of plan to follow up with lawyer regarding appeal . Self administers medications as prescribed . Attends all scheduled provider appointments . Calls pharmacy for medication refills . Performs ADL's independently  Please see past updates related to this goal by clicking on the "Past Updates" button in the selected goal      .  COMPLETED: "I need help with roof repairs and heating" (pt-stated)        CARE PLAN ENTRY (see longitudinal plan of care for additional care plan information)  Current Barriers:  . Financial constraints related to cost of home modification needs . Limited knowledge of community resources to assist with home modifications . Chronic conditions including HTN and ulcerative colitis which impact patient ability to perform  iADL's in the home   Social Work Clinical Goal(s):  [name] Over the next 30 days the patient will work with SW to identify resources to assist with home modifications  CCM SW Interventions: Completed 06/16/20 . Successful outbound call placed to the patient to assess goal progression . Determined the patient has chosen to return to work which means she is no longer eligible to access the Home Repair program under [employer]  . Goal closed  Completed 05/05/20 . Successful outbound call placed to the patient to discuss resource options . Advised the patient of the Home Repair program under [facility] ([place]) to assist with roof repair needs and heating costs o Discussed this program is for homeowners over the age of 8 or persons on  disability o Patient reported the home is in her mothers name but is a family home . Provided the patient with contact number to [facility] to determine if eligible for program . Scheduled follow up call over the next two weeks to assess outcome of screening call  Completed 04/28/20 . Inter-disciplinary care team collaboration (see longitudinal plan of care) . Successful outbound call placed to the patient to assist with care coordination needs . Determined the patient is in need of home modification resources o The patient reports she has a leaky roof and is worried she may have mold in her home from the leaks o The patient indicates she heats her home with a gas stove located in the basement. It is difficult for patient to locate wood, go up and down stairs, and lift wood to place into wood stove o The patient is interested in resources to help with cost and installation of electric heating unit . Advised the patient SW was not familiar with her county but would look into resources . Scheduled follow up call with the patient over the next week . Successful outbound call placed to Lajuana Ripple with [place] to inquire of local programs for home  modifications o SW advised to contact [place] ([place]) for assistance . Outbound call placed to [facility] voice message left requesting return call  Patient Self Care Activities:  . Patient verbalizes understanding of plan to work with SW to identify resources to assist with care coordination needs . Self administers medications as prescribed . Attends all scheduled provider appointments . Performs ADL's independently . Calls provider office for new concerns or questions  Please see past updates related to this goal by clicking on the "Past Updates" button in the selected goal          Follow Up Plan: SW will follow up with patient by phone over the next month.   [name], BSW, CDP Social Worker, Certified Dementia Practitioner [place] [facility] [place] [facility] [phone]  Total time spent performing care coordination and/or care management activities with the patient by phone or face to face = 16 minutes.

## 2020-06-16 NOTE — Patient Instructions (Signed)
Social Worker Visit Information  Goals we discussed today:  Goals Addressed              This Visit's Progress     Patient Stated   .  "I got a letter they are denying me for disability" (pt-stated)        CARE PLAN ENTRY (see longitudinal plan of care for additional care plan information)  Current Barriers:  . Chronic conditions including HTN, Ulcerative Colitis, and Crohn's disease which impact patients ability to work . Unknown reason for decision to decline further disability payment after case review  Social Work Clinical Goal(s):  [name] Over the next 5 days the patient will work with a Media planner to initiate appeal process Goal Met . New 05/06/20 Over the next 60 days the patient will work with [place] to complete appeals process regarding Disability review   CCM SW Interventions: Completed 06/16/20 . Successful outbound call placed to the patient to assess goal progression . Determined the patient is no longer pursuing Disability appeal due to concern surrounding lawyer fees . 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 Almyra Free is concerned she may not be able to perform full-time job duties . Discussed patient concern over possibility of needing back surgery o "Dr. Eugenio Hoes says he does not think dry needling or therapy will help" o Patient reports plans to see Dr. Jon Billings tomorrow regarding possibility of upcoming surgery due to back pain . Collaboration with [place] regarding patient plans to return to work and possibility of back surgery being needed in the near future . Scheduled follow up call to the patient over the next month   Completed 05/28/20 . Successful outbound call placed to the patient to assess  goal progression . Determined the patient has been in contact with Mrs. Glenford Peers with [place] to determine Mrs. Glenford Peers has yet to receive a response from Disability regarding patient appeal . Discussed the patient is actively looking for jobs she may be successful at due to concern with finances . Advised patient to contact SW once she receives a response from disability regarding appeal status . Scheduled follow up call over the next month  Completed 05/18/20 . Successful outbound call placed to the patient to assess goal progression . Determined the patient just ended a meeting with the [place] who plans to work with the patient in the appeals process . Discussed the patient is beginning to face challenges associated with funds to afford food and utility costs . Advised the patient to contact her local DSS to request financial assistance  . Scheduled follow up call over the next week  Completed 05/13/20 . Received inbound call from the patient who reports her disability benefits have now stopped and she will lose her health insurance after this month o The patient is very concerned with losing insurance due to not being able to continue infusion treatments o The patient reports her income has now stopped without notice or response from "reconsideration" the patient submitted . Determined the patient was contacted by the Disability Case Reviewer who was unable to communicate why the patients benefits were stopped so soon without notice. The reviewer was also unable to confirm the reconsideration  was received or how long it would take for the reconsideration to take place . Provided the patient with the number to the [facility] in [place] to discuss needs . Collaboration with the patients primary provider and RN Care Manager to provide an update on goal progression  Completed 05/06/20 . Successful outbound call placed to the patient to  assess goal progression . Determined the patient was contacted by the lawyer who informed the patient she is unable to assist with this part of the process . Discussed the patients letter stated they reviewer "feels the patient has improved enough to do light work" o Patient reports she spoke with Dr. Benson Norway who wrote a more detailed letter entailing why he would not release the patient to return to work . Determined  the patient has submitted paperwork for a "reconsideration" of the review o Paperwork includes letters from Dr. Benson Norway, the patient, her mother, sister, and cousin describing patient disability and inability to work o The patient is awaiting a call to schedule a review of the reconsideration o If the reconsideration is not approved, the patient will have 60 days to formally appeal the reviewers decision . Scheduled follow up call to the patient over the next two weeks o Advised the patient to contact SW sooner as needed  Completed 05/05/20 . Inter-disciplinary care team collaboration (see longitudinal plan of care) . Successful outbound call placed to the patient to assist with care coordination needs . Determined the patient received a letter stating her case review resulted in a denial of disability benefits . Discussed patient opportunity to appeal the denial o Patient reported she has tried to contact Disability office x 2 with both calls being cut off while holding for assistance . Determined the patient has contacted a lawyer office and spoken to the [place] regarding denial o Patient has provided a copy of the disability denial letter and is awaiting a return call from the lawyer . Advised the patient to contact the lawyer again if she does not receive a return call in the next 24 hours . Encouraged the patient for the steps she has taken to advocate for herself . Scheduled a follow up call for 05/06/20 to assess goal progression . Collaboration with RN Care Manager to inform of  goal and plan to assist with care coordination surrounding disability denial  Patient Self Care Activities:  . Patient verbalizes understanding of plan to follow up with lawyer regarding appeal . Self administers medications as prescribed . Attends all scheduled provider appointments . Calls pharmacy for medication refills . Performs ADL's independently  Please see past updates related to this goal by clicking on the "Past Updates" button in the selected goal      .  COMPLETED: "I need help with roof repairs and heating" (pt-stated)        CARE PLAN ENTRY (see longitudinal plan of care for additional care plan information)  Current Barriers:  . Financial constraints related to cost of home modification needs . Limited knowledge of community resources to assist with home modifications . Chronic conditions including HTN and ulcerative colitis which impact patient ability to perform iADL's in the home   Social Work Clinical Goal(s):  [name] Over the next 30 days the patient will work with SW to identify resources to assist with home modifications  CCM SW Interventions: Completed 06/16/20 . Successful outbound call placed to the patient to assess goal progression . Determined the patient has chosen to return to work  which means she is no longer eligible to access the Home Repair program under [facility]  . Goal closed  Completed 05/05/20 . Successful outbound call placed to the patient to discuss resource options . Advised the patient of the Home Repair program under [facility] ([place]) to assist with roof repair needs and heating costs o Discussed this program is for homeowners over the age of 37 or persons on disability o Patient reported the home is in her mothers name but is a family home . Provided the patient with contact number to [facility] to determine if eligible for program . Scheduled follow up call over the next two weeks to assess outcome of screening call  Completed  04/28/20 . Inter-disciplinary care team collaboration (see longitudinal plan of care) . Successful outbound call placed to the patient to assist with care coordination needs . Determined the patient is in need of home modification resources o The patient reports she has a leaky roof and is worried she may have mold in her home from the leaks o The patient indicates she heats her home with a gas stove located in the basement. It is difficult for patient to locate wood, go up and down stairs, and lift wood to place into wood stove o The patient is interested in resources to help with cost and installation of electric heating unit . Advised the patient SW was not familiar with her county but would look into resources . Scheduled follow up call with the patient over the next week . Successful outbound call placed to Lajuana Ripple with [place] to inquire of local programs for home modifications o SW advised to contact [place] ([place]) for assistance . Outbound call placed to [facility] voice message left requesting return call  Patient Self Care Activities:  . Patient verbalizes understanding of plan to work with SW to identify resources to assist with care coordination needs . Self administers medications as prescribed . Attends all scheduled provider appointments . Performs ADL's independently . Calls provider office for new concerns or questions  Please see past updates related to this goal by clicking on the "Past Updates" button in the selected goal         Follow Up Plan: SW will follow up with patient by phone over the next month.   [name], BSW, CDP Social Worker, Certified Dementia Practitioner [place] [facility] [place] [facility] [phone]

## 2020-06-21 ENCOUNTER — Telehealth: Payer: Self-pay | Admitting: Pharmacist

## 2020-06-21 NOTE — Chronic Care Management (AMB) (Signed)
Chronic Care Management Pharmacy Assistant   Name: KENDRICK REMIGIO  MRN: [id number] DOB: [birth year]  Reason for Encounter: Medication Review/ Dispensing Call  Patient Questions:  1.  Have you seen any other providers since your last visit? Yes, 05/05/20, 05/06/20, 05/13/20, 05/18/20, 05/28/20 with [name] (CCM LSW), 05/31/20- [facility], 06/04/20 & 06/16/20- Barb Merino, RN (CCM Nurse).  2.  Any changes in your medicines or health? Yes, Hydrocodone 7.5/325 mg added on 05/31/20 and Tramadol 50 mg discontinued 05/31/20.   PCP : [name], FNP  Allergies:   Allergies  Allergen Reactions  . Humira [Adalimumab] Anaphylaxis  . Ampicillin Hives and Itching  . Codeine   . Compazine [Prochlorperazine Maleate] Other (See Comments)    "Stroke-like symptoms" Can tolerate Phenergan.  [name] Cymbalta [Duloxetine Hcl] Other (See Comments)    Fainting  . Imitrex [Sumatriptan Base] Hives and Nausea And Vomiting  . Lyrica [Pregabalin] Other (See Comments)    Fainting  . Metoclopramide Hcl Hives  . Percocet [Oxycodone-Acetaminophen] Itching  . Effexor [Venlafaxine Hydrochloride] Hives and Palpitations  . Tape Itching and Rash    Medications: Outpatient Encounter Medications as of 06/21/2020  Medication Sig  . albuterol (VENTOLIN HFA) 108 (90 Base) MCG/ACT inhaler Inhale 2 puffs into the lungs every 6 (six) hours as needed for wheezing or shortness of breath.  [name] amitriptyline (ELAVIL) 25 MG tablet Take 25 mg by mouth at bedtime.   . beclomethasone (QVAR) 80 MCG/ACT inhaler Inhale 1 puff into the lungs daily as needed (Shortness of Breath). (Patient not taking: Reported on 04/10/2020)  . celecoxib (CELEBREX) 200 MG capsule Take 200 mg by mouth 2 (two) times daily.   . cetirizine (ZYRTEC) 10 MG tablet Take 10 mg by mouth daily.  [name] dicyclomine (BENTYL) 20 MG tablet Take 20 mg by mouth 4 (four) times daily -  before meals and at bedtime. Up to four times daily as needed  . diphenhydrAMINE  (BENADRYL) 25 mg capsule Take 25 mg by mouth daily as needed for itching. (Patient not taking: Reported on 04/10/2020)  . folic acid (FOLVITE) 1 MG tablet Take 3 mg by mouth daily.   [name] gabapentin (NEURONTIN) 300 MG capsule Take 2 capsules (600 mg total) by mouth 3 (three) times daily.  [name] HYDROcodone-acetaminophen (NORCO) 7.5-325 MG tablet Take 1 tablet by mouth every 6 (six) hours as needed for moderate pain.  [name] inFLIXimab (REMICADE) 100 MG injection Inject 100 mg into the vein every 30 (thirty) days. Infuse by intravenous route every 4 weeks  Patient is receiving Inflectra brand verses Remicade brand due to insurance (Patient not taking: Reported on 04/10/2020)  . inFLIXimab in sodium chloride 0.9 % Inject 10 mg/kg into the vein every 30 (thirty) days. Infuse every 4 weeks  . loperamide (IMODIUM) 2 MG capsule Take 1 capsule (2 mg total) by mouth 4 (four) times daily as needed for diarrhea or loose stools.  . meloxicam (MOBIC) 15 MG tablet Take 15 mg by mouth daily. (Patient not taking: Reported on 04/10/2020)  . methotrexate (50 MG/ML) 1 g injection Inject into the vein every Monday. 0.25m every Monday  . olmesartan (BENICAR) 20 MG tablet Take 1 tablet (20 mg total) by mouth daily.  . ondansetron (ZOFRAN) 4 MG tablet Take 4 mg by mouth every 6 (six) hours as needed for nausea or vomiting.  . temazepam (RESTORIL) 7.5 MG capsule Take 1 capsule (7.5 mg total) by mouth at bedtime as needed for sleep.  .[name] (ZANAFLEX)  2 MG tablet Take 2 mg by mouth 3 (three) times daily.   No facility-administered encounter medications on file as of 06/21/2020.    Current Diagnosis: Patient Active Problem List   Diagnosis Date Noted  . Right-sided low back pain with right-sided sciatica 05/06/2019  . Right sided numbness 03/27/2019  . Paresthesia 12/25/2018  . Essential hypertension 06/26/2018  . Abdominal pain, chronic, right lower quadrant 01/06/2015  . Nausea and vomiting 01/05/2015  . Ulcerative colitis  ([place]) 01/05/2015  . Enteritis   . Nausea with vomiting   . Intractable nausea and vomiting 04/29/2014  . Benign paroxysmal positional vertigo 03/14/2013  . Lymphedema of arm 12/30/2011   Reviewed chart for medication changes ahead of medication coordination call.  OVs and hospital visits-  05/05/20, 05/06/20, 05/13/20, 05/18/20, 05/28/20 with [name] (CCM LSW), 05/31/20- [facility], 06/04/20 & 06/16/20- Barb Merino, RN (CCM Nurse) since last care coordination call/Pharmacist visit.   Medication changes indicated:  Hydrocodone 7.5/325 mg added on 05/31/20 and Tramadol 50 mg discontinued 05/31/20.    BP Readings from Last 3 Encounters:  05/31/20 (!) 111/59  02/26/20 (!) 132/94  02/12/20 138/87    No results found for: HGBA1C   Patient obtains medications through V[facility]  30 and 90 Day Supply   Last adherence delivery included: On 04/24/20 patient received Temazepam 7.5 mg- 1 capsule at bedtime as needed for sleep (30 DS), Folic Acid 1 mg- 3 tablets once daily (67 DS), Tizanidine 2 mg- 1 tablet three times daily (90 DS), Albuterol Aer Hfa- inhale two puffs every 6 hours as needed (25 DS), Celebrex 200 mg- 1 capsule twice daily with food (69 DS), Gabapentin 300 mg- 2 capsules by mouth three times daily (67 DS), Methotrexate 50 mg/47m vial- inject 1 ml once weekly (84 DS), Loperamide 2 mg- 1 capsule by mouth four times daily as needed(3 DS). On 04/23/2020- Dicyclomine 20 mg- 1 tablet four times daily (66 DS).     Patient did not decline any medications due using PRN or having an adequate supply until next adherence delivery.  Patient is due for next adherence delivery on: 06/25/20. Called patient and reviewed medications and coordinated delivery.  This delivery to include: Temazepam 7.5 mg - 1 capsule daily as needed for sleep and Olmesartan 20 mg- 1 tablet by mouth daily, Gabapentin 300 mg- 2 capsules by mouth three times daily, Dicyclomine 20 mg- 1 tablet four times daily.  Patient declined  the following medications: Folic Acid 1 mg- 3 tablets once daily due to having a 30 DS left on medication from previous [facility] fill, Tizanidine 2 mg- 1 tablet three times daily due to having and adequate supply until 07/27/20,   Albuterol Aer Hfa- inhale two puffs every 6 hours as needed due to PRN use, Celebrex 200 mg- 1 capsule twice daily with food due to having an adequate supply until 07/06/20,  Loperamide 2 mg- 1 capsule by mouth four times daily as needed due to PRN use, Methotrexate 50 mg/241mvial- inject 1 ml once weekly due to having an adequate supply until 07/21/20. Amitriptyline 25 mg- 1 tablet nightly declined due to [place]  Coordinated acute fill for Celebrex 200 mg- 1 capsule twice daily with food to be delivered 07/03/2020 and Methotrexate 50 mg/2 ml- Inject 1 ml once weekly to be delivered 07/17/20.   Patient needs refills for Olmesartan 20 mg- 1 tablet daily .  Confirmed delivery date of  06/25/20, advised patient that pharmacy will contact them the morning of delivery.  Follow-Up:  Coordination of Enhanced Pharmacy Services and Pharmacist Review  Patient will need a new prescription sent to [facility]. Patient also mentioned that she will continue to get her Amitriptyline from [facility] in [place], [place]. Called [facility] to see when patient last filled Olmesartan, patient last received on 03/30/2020 for 90 DS. Leata Mouse, CPP notified.  [name], [place] Pharmacist Assistant [phone]

## 2020-06-22 ENCOUNTER — Other Ambulatory Visit: Payer: Self-pay

## 2020-06-22 DIAGNOSIS — I10 Essential (primary) hypertension: Secondary | ICD-10-CM

## 2020-06-22 MED ORDER — OLMESARTAN MEDOXOMIL 20 MG PO TABS: 20.0000 mg | ORAL_TABLET | Freq: Every day | ORAL | 1 refills | Status: DC

## 2020-06-23 ENCOUNTER — Telehealth: Payer: Self-pay

## 2020-06-23 NOTE — Telephone Encounter (Signed)
I called the pt to schedule AWV, but she said her Medicare coverage will be cancelled effective 06/29/20.

## 2020-06-27 ENCOUNTER — Emergency Department (HOSPITAL_COMMUNITY): Payer: Medicare Other

## 2020-06-27 ENCOUNTER — Other Ambulatory Visit: Payer: Self-pay

## 2020-06-27 ENCOUNTER — Emergency Department (HOSPITAL_COMMUNITY)
Admission: EM | Admit: 2020-06-27 | Discharge: 2020-06-27 | Disposition: A | Discharge status: Home / Self Care | Payer: Medicare Other | Attending: Emergency Medicine | Admitting: Emergency Medicine

## 2020-06-27 ENCOUNTER — Encounter (HOSPITAL_COMMUNITY): Payer: Self-pay | Admitting: Emergency Medicine

## 2020-06-27 DIAGNOSIS — R55 Syncope and collapse: Secondary | ICD-10-CM | POA: Insufficient documentation

## 2020-06-27 DIAGNOSIS — Z20822 Contact with and (suspected) exposure to covid-19: Secondary | ICD-10-CM | POA: Insufficient documentation

## 2020-06-27 DIAGNOSIS — N3091 Cystitis, unspecified with hematuria: Secondary | ICD-10-CM | POA: Insufficient documentation

## 2020-06-27 DIAGNOSIS — J45909 Unspecified asthma, uncomplicated: Secondary | ICD-10-CM | POA: Insufficient documentation

## 2020-06-27 DIAGNOSIS — N3001 Acute cystitis with hematuria: Secondary | ICD-10-CM

## 2020-06-27 LAB — CBC WITH DIFFERENTIAL/PLATELET
Abs Immature Granulocytes: 0.02 10*3/uL (ref 0.00–0.07)
Basophils Absolute: 0 10*3/uL (ref 0.0–0.1)
Basophils Relative: 0 %
Eosinophils Absolute: 0.1 10*3/uL (ref 0.0–0.5)
Eosinophils Relative: 2 %
HCT: 41.9 % (ref 36.0–46.0)
Hemoglobin: 13.2 g/dL (ref 12.0–15.0)
Immature Granulocytes: 0 %
Lymphocytes Relative: 26 %
Lymphs Abs: 1.9 10*3/uL (ref 0.7–4.0)
MCH: 29.7 pg (ref 26.0–34.0)
MCHC: 31.5 g/dL (ref 30.0–36.0)
MCV: 94.2 fL (ref 80.0–100.0)
Monocytes Absolute: 0.6 10*3/uL (ref 0.1–1.0)
Monocytes Relative: 9 %
Neutro Abs: 4.7 10*3/uL (ref 1.7–7.7)
Neutrophils Relative %: 63 %
Platelets: 204 10*3/uL (ref 150–400)
RBC: 4.45 MIL/uL (ref 3.87–5.11)
RDW: 13.7 % (ref 11.5–15.5)
WBC: 7.3 10*3/uL (ref 4.0–10.5)
nRBC: 0 % (ref 0.0–0.2)

## 2020-06-27 LAB — RESP PANEL BY RT PCR (RSV, FLU A&B, COVID)
Influenza A by PCR: NEGATIVE
Influenza B by PCR: NEGATIVE
SARS Coronavirus 2 by RT PCR: NEGATIVE

## 2020-06-27 LAB — URINALYSIS, ROUTINE W REFLEX MICROSCOPIC
Bilirubin Urine: NEGATIVE
Glucose, UA: NEGATIVE mg/dL
Ketones, ur: NEGATIVE mg/dL
Nitrite: POSITIVE — AB
Protein, ur: NEGATIVE mg/dL
RBC / HPF: >50 RBC/hpf — ABNORMAL HIGH (ref 0–5)
Specific Gravity, Urine: 1.011 (ref 1.005–1.030)
pH: 7 (ref 5.0–8.0)

## 2020-06-27 LAB — COMPREHENSIVE METABOLIC PANEL
ALT: 20 U/L (ref 0–44)
AST: 17 U/L (ref 15–41)
Albumin: 3.9 g/dL (ref 3.5–5.0)
Alkaline Phosphatase: 81 U/L (ref 38–126)
Anion gap: 8 (ref 5–15)
BUN: 14 mg/dL (ref 6–20)
CO2: 27 mmol/L (ref 22–32)
Calcium: 9.2 mg/dL (ref 8.9–10.3)
Chloride: 103 mmol/L (ref 98–111)
Creatinine, Ser: 0.93 mg/dL (ref 0.44–1.00)
GFR, Estimated: >60 mL/min (ref >60)
Glucose, Bld: 109 mg/dL — ABNORMAL HIGH (ref 70–99)
Potassium: 4.3 mmol/L (ref 3.5–5.1)
Sodium: 138 mmol/L (ref 135–145)
Total Bilirubin: 0.7 mg/dL (ref 0.3–1.2)
Total Protein: 7.6 g/dL (ref 6.5–8.1)

## 2020-06-27 MED ORDER — ONDANSETRON HCL 4 MG/2ML IJ SOLN
4.0000 mg | Freq: Once | INTRAMUSCULAR | Status: AC
  Administered 2020-06-27: 4 mg via INTRAVENOUS
  Filled 2020-06-27: qty 2

## 2020-06-27 MED ORDER — SULFAMETHOXAZOLE-TRIMETHOPRIM 800-160 MG PO TABS
1.0000 | ORAL_TABLET | Freq: Once | ORAL | Status: AC
  Administered 2020-06-27: 1 via ORAL
  Filled 2020-06-27: qty 1

## 2020-06-27 MED ORDER — SODIUM CHLORIDE 0.9 % IV BOLUS
1000.0000 mL | Freq: Once | INTRAVENOUS | Status: AC
  Administered 2020-06-27: 1000 mL via INTRAVENOUS

## 2020-06-27 MED ORDER — NITROFURANTOIN MONOHYD MACRO 100 MG PO CAPS: 100.0000 mg | ORAL_CAPSULE | Freq: Two times a day (BID) | ORAL | 0 refills | Status: DC

## 2020-06-27 NOTE — ED Provider Notes (Signed)
[facility] EMERGENCY DEPARTMENT Provider Note   CSN: [id number] Arrival date & time: 06/27/20  1149     History Chief Complaint  Patient presents with   Dizziness    Gabrielle Hawkins is a 54 y.o. female with a history significant for Crohn's disease on remicade infusion, also with asthma, GERD, anxiety, chronic low back pain with right sided sciatica presenting with syncope and collapse x 2.  She found out she was exposed to a friend 4 days ago who tested positive for Covid this week.  She left her home with the intention of buying a covid test kit at a drug store, instead ended up at her parents home, where she fainted upon entering the home, hitting the floor and briefly being unconscious. When ems arrived and were walking her out to the ambulance, she had another presyncopal event.  She does not recall driving, going to the drug store or arriving at her parents home. She denies headache, chest pain, sob palpitations, also no n/v or abdominal pain.  She describes right lateral neck pain at this time.  She is covid vaccinated.   She and mother give hx, mother stating pt has been on disability x 6 years, recently found out her disability was being cancelled. She plans to return to work this next week but has concerns her GI specialist doesn't want her to return but she needs to work to keep insurance.  Endorses sig stress over this situation, but at the same time excited about returning to work.   The history is provided by the patient and a parent.       Past Medical History:  Diagnosis Date   Anemia 1998    Anxiety    Arthritis    big toe    Asthma    BRCA1 negative    BRCA2 negative    Breast cancer ([place]) 2006   chem/radiation/2years tamoxifen   Chronic neck and back pain    Crohn's disease ([place])    Depression    Endometriosis    Fibroid    GERD (gastroesophageal reflux disease)    History of chemotherapy 01/2005   Hx of tamoxifen therapy    Hx of  transfusion of packed red blood cells    IBS (irritable bowel syndrome)    Kidney stones    Lymphedema of arm    Migraines    just at dx of breast CA   Mucinous cystadenoma of ovary    left   Neuropathy    Paresthesias    Peripheral neuropathy    Radiation 04/2005   Stroke [facility])    related to when taken compazine   Ulcerative colitis ([place])    with chronic diarrhea   Urinary tract infection    x 3   Vertigo 2014   Vitamin D deficiency     Patient Active Problem List   Diagnosis Date Noted   Right-sided low back pain with right-sided sciatica 05/06/2019   Right sided numbness 03/27/2019   Paresthesia 12/25/2018   Essential hypertension 06/26/2018   Abdominal pain, chronic, right lower quadrant 01/06/2015   Nausea and vomiting 01/05/2015   Ulcerative colitis ([place]) 01/05/2015   Enteritis    Nausea with vomiting    Intractable nausea and vomiting 04/29/2014   Benign paroxysmal positional vertigo 03/14/2013   Lymphedema of arm 12/30/2011    Past Surgical History:  Procedure Laterality Date   ABDOMINAL HYSTERECTOMY  2007   TAH/BSO   BREAST SURGERY  2006  LUMPECTOMY   CHOLECYSTECTOMY     CHOLECYSTECTOMY N/A 04/29/2014   Procedure: LAPAROSCOPIC CHOLECYSTECTOMY WITH INTRAOPERATIVE CHOLANGIOGRAM;  Surgeon: Edward Jolly, MD;  Location: [facility] ORS;  Service: General;  Laterality: N/A;   COLONOSCOPY  June 2013   Dr. Benson Norway: sessile serrated adenoma   COLONOSCOPY WITH PROPOFOL N/A 03/13/2015   Procedure: COLONOSCOPY WITH PROPOFOL;  Surgeon: [name], MD;  Location: [facility] ENDOSCOPY;  Service: Endoscopy;  Laterality: N/A;   DILATION AND CURETTAGE OF UTERUS     FLEXIBLE SIGMOIDOSCOPY N/A 07/18/2014   Dr. Benson Norway: mild left-sided colitis, biopsies with quiescent UC   MYOMECTOMY     TONSILLECTOMY       OB History    Gravida  1   Para  0   Term      Preterm      AB  1   Living  0     SAB  1   TAB      Ectopic      Multiple        Live Births              Family History  Problem Relation Age of Onset   Breast cancer Mother 9   Heart failure Father    Heart disease Father    Cancer Maternal Grandmother        COLON   Heart failure Paternal Grandmother    Heart disease Paternal Grandmother     Social History   Tobacco Use   Smoking status: Never Smoker   Smokeless tobacco: Never Used  Vaping Use   Vaping Use: Never assessed  Substance Use Topics   Alcohol use: No   Drug use: No    Home Medications Prior to Admission medications   Medication Sig Start Date End Date Taking? Authorizing Provider  albuterol (VENTOLIN HFA) 108 (90 Base) MCG/ACT inhaler Inhale 2 puffs into the lungs every 6 (six) hours as needed for wheezing or shortness of breath. 04/13/20   [name], FNP  amitriptyline (ELAVIL) 25 MG tablet Take 25 mg by mouth at bedtime.  05/20/15   [provider]  beclomethasone (QVAR) 80 MCG/ACT inhaler Inhale 1 puff into the lungs daily as needed (Shortness of Breath). Patient not taking: Reported on 04/10/2020    [provider]  celecoxib (CELEBREX) 200 MG capsule Take 200 mg by mouth 2 (two) times daily.     [provider]  cetirizine (ZYRTEC) 10 MG tablet Take 10 mg by mouth daily.    [provider]  dicyclomine (BENTYL) 20 MG tablet Take 20 mg by mouth 4 (four) times daily -  before meals and at bedtime. Up to four times daily as needed    [provider]  diphenhydrAMINE (BENADRYL) 25 mg capsule Take 25 mg by mouth daily as needed for itching. Patient not taking: Reported on 04/10/2020    [provider]  folic acid (FOLVITE) 1 MG tablet Take 3 mg by mouth daily.     [provider]  gabapentin (NEURONTIN) 300 MG capsule Take 2 capsules (600 mg total) by mouth 3 (three) times daily. 04/21/20   Suzzanne Cloud, NP  HYDROcodone-acetaminophen (NORCO) 7.5-325 MG tablet Take 1 tablet by mouth every 6 (six) hours as needed  for moderate pain. 05/31/20   Triplett, Tammy, PA-C  inFLIXimab (REMICADE) 100 MG injection Inject 100 mg into the vein every 30 (thirty) days. Infuse by intravenous route every 4 weeks  Patient is receiving Inflectra brand verses  Remicade brand due to insurance Patient not taking: Reported on 04/10/2020    [provider]  inFLIXimab in sodium chloride 0.9 % Inject 10 mg/kg into the vein every 30 (thirty) days. Infuse every 4 weeks    [provider]  loperamide (IMODIUM) 2 MG capsule Take 1 capsule (2 mg total) by mouth 4 (four) times daily as needed for diarrhea or loose stools. 04/13/20   [name], FNP  meloxicam (MOBIC) 15 MG tablet Take 15 mg by mouth daily. Patient not taking: Reported on 04/10/2020    [provider]  methotrexate (50 MG/ML) 1 g injection Inject into the vein every Monday. 0.91m every Monday    [provider]  nitrofurantoin, macrocrystal-monohydrate, (MACROBID) 100 MG capsule Take 1 capsule (100 mg total) by mouth 2 (two) times daily. 06/27/20   Ellijah Leffel, JAlmyra Free PA-C  olmesartan (BENICAR) 20 MG tablet Take 1 tablet (20 mg total) by mouth daily. 06/22/20   MMinette Brine FNP  ondansetron (ZOFRAN) 4 MG tablet Take 4 mg by mouth every 6 (six) hours as needed for nausea or vomiting.    [provider]  temazepam (RESTORIL) 7.5 MG capsule Take 1 capsule (7.5 mg total) by mouth at bedtime as needed for sleep. 04/13/20   MMinette Brine FNP  tiZANidine (ZANAFLEX) 2 MG tablet Take 2 mg by mouth 3 (three) times daily.    [provider]    Allergies    Humira [adalimumab], Ampicillin, Codeine, Compazine [prochlorperazine maleate], Cymbalta [duloxetine hcl], Imitrex [sumatriptan base], Lyrica [pregabalin], Metoclopramide hcl, Percocet [oxycodone-acetaminophen], Effexor [venlafaxine hydrochloride], and Tape  Review of Systems   Review of Systems  Constitutional: Negative for chills and fever.  HENT: Negative.   Eyes: Negative.    Respiratory: Negative for chest tightness and shortness of breath.   Cardiovascular: Negative for chest pain and palpitations.  Gastrointestinal: Negative for abdominal pain, nausea and vomiting.  Genitourinary: Negative.   Musculoskeletal: Positive for neck pain. Negative for arthralgias and joint swelling.  Skin: Negative.  Negative for rash and wound.  Neurological: Positive for syncope and light-headedness. Negative for dizziness, weakness, numbness and headaches.  Psychiatric/Behavioral: Negative.     Physical Exam Updated Vital Signs BP 120/77    Pulse 79    Resp 20    Ht 5' 7"  (1.702 m)    Wt 83 kg    SpO2 98%    BMI 28.66 kg/m   Physical Exam Vitals and nursing note reviewed.  Constitutional:      Appearance: She is well-developed.  HENT:     Head: Normocephalic and atraumatic.  Eyes:     Conjunctiva/sclera: Conjunctivae normal.  Cardiovascular:     Rate and Rhythm: Normal rate and regular rhythm.     Pulses: Normal pulses.     Heart sounds: Normal heart sounds.  Pulmonary:     Effort: Pulmonary effort is normal. No respiratory distress.     Breath sounds: Normal breath sounds. No wheezing, rhonchi or rales.  Abdominal:     General: Bowel sounds are normal. There is no distension.     Palpations: Abdomen is soft.     Tenderness: There is no abdominal tenderness. There is no guarding or rebound.  Musculoskeletal:        General: Normal range of motion.     Cervical back: Normal range of motion.  Skin:    General: Skin is warm and dry.  Neurological:     General: No focal deficit present.     Mental  Status: She is alert.     ED Results / Procedures / Treatments   Labs (all labs ordered are listed, but only abnormal results are displayed) Labs Reviewed  COMPREHENSIVE METABOLIC PANEL - Abnormal; Notable for the following components:      Result Value   Glucose, Bld 109 (*)    All other components within normal limits  URINALYSIS, ROUTINE W REFLEX MICROSCOPIC  - Abnormal; Notable for the following components:   APPearance HAZY (*)    Hgb urine dipstick LARGE (*)    Nitrite POSITIVE (*)    Leukocytes,Ua MODERATE (*)    RBC / HPF >50 (*)    Bacteria, UA FEW (*)    All other components within normal limits  RESP PANEL BY RT PCR (RSV, FLU A&B, COVID)  CBC WITH DIFFERENTIAL/PLATELET    EKG EKG Interpretation  Date/Time:  Saturday June 27 2020 11:57:33 EDT Ventricular Rate:  89 PR Interval:  152 QRS Duration: 96 QT Interval:  384 QTC Calculation: 467 R Axis:   -14 Text Interpretation: Normal sinus rhythm No significant change since last tracing Confirmed by Lajean Saver [phone]) on 06/27/2020 6:44:55 PM   Radiology CT Head Wo Contrast  Result Date: 06/27/2020 CLINICAL DATA:  Head trauma. EXAM: CT HEAD WITHOUT CONTRAST CT CERVICAL SPINE WITHOUT CONTRAST TECHNIQUE: Multidetector CT imaging of the head and cervical spine was performed following the standard protocol without intravenous contrast. Multiplanar CT image reconstructions of the cervical spine were also generated. COMPARISON:  MRI head 04/29/2019, CT head November 13, 2006, MRI cervical spine from March 05 2013. FINDINGS: CT HEAD FINDINGS Brain: No evidence of acute infarction, hemorrhage, hydrocephalus, extra-axial collection or mass lesion/mass effect. Similar slightly dysmorphic appearance of the superior vermis, likely congenital. Vascular: Mild calcific atherosclerosis. Skull: No acute fracture. Sinuses/Orbits: Sinuses are clear.  Unremarkable orbits. Other: No mastoid effusions. CT CERVICAL SPINE FINDINGS Alignment: There is reversal of the normal cervical lordosis, likely positional. Slight anterolisthesis of C3 on C4, similar in appearance to prior MRI of the cervical spine from 03/05/2013. Slight rotation of C1 on C2. Skull base and vertebrae: No acute fracture. Vertebral body heights are maintained. No primary bone lesion or focal pathologic process. Soft tissues and spinal canal: No  prevertebral fluid or swelling. No visible canal hematoma. Disc levels: Mild multilevel degenerative change with small disc bulges at multiple levels. No evidence of high-grade bony canal stenosis. Upper chest: Negative. Other: None. IMPRESSION: CT head: No evidence of acute intracranial abnormality. CT cervical spine: 1. No evidence of acute fracture. 2. Mild rotation of C1 on C2, which is likely positional in the absence of a fixed torticollis. Otherwise, no evidence of acute traumatic malalignment. Electronically Signed   By: Margaretha Sheffield MD   On: 06/27/2020 15:27   CT Cervical Spine Wo Contrast  Result Date: 06/27/2020 CLINICAL DATA:  Head trauma. EXAM: CT HEAD WITHOUT CONTRAST CT CERVICAL SPINE WITHOUT CONTRAST TECHNIQUE: Multidetector CT imaging of the head and cervical spine was performed following the standard protocol without intravenous contrast. Multiplanar CT image reconstructions of the cervical spine were also generated. COMPARISON:  MRI head 04/29/2019, CT head November 13, 2006, MRI cervical spine from March 05 2013. FINDINGS: CT HEAD FINDINGS Brain: No evidence of acute infarction, hemorrhage, hydrocephalus, extra-axial collection or mass lesion/mass effect. Similar slightly dysmorphic appearance of the superior vermis, likely congenital. Vascular: Mild calcific atherosclerosis. Skull: No acute fracture. Sinuses/Orbits: Sinuses are clear.  Unremarkable orbits. Other: No mastoid effusions. CT CERVICAL SPINE FINDINGS Alignment: There  is reversal of the normal cervical lordosis, likely positional. Slight anterolisthesis of C3 on C4, similar in appearance to prior MRI of the cervical spine from 03/05/2013. Slight rotation of C1 on C2. Skull base and vertebrae: No acute fracture. Vertebral body heights are maintained. No primary bone lesion or focal pathologic process. Soft tissues and spinal canal: No prevertebral fluid or swelling. No visible canal hematoma. Disc levels: Mild multilevel  degenerative change with small disc bulges at multiple levels. No evidence of high-grade bony canal stenosis. Upper chest: Negative. Other: None. IMPRESSION: CT head: No evidence of acute intracranial abnormality. CT cervical spine: 1. No evidence of acute fracture. 2. Mild rotation of C1 on C2, which is likely positional in the absence of a fixed torticollis. Otherwise, no evidence of acute traumatic malalignment. Electronically Signed   By: Margaretha Sheffield MD   On: 06/27/2020 15:27    Procedures Procedures (including critical care time)  Medications Ordered in ED Medications  sodium chloride 0.9 % bolus 1,000 mL (0 mLs Intravenous Stopped 06/27/20 1739)  sulfamethoxazole-trimethoprim (BACTRIM DS) 800-160 MG per tablet 1 tablet (1 tablet Oral Given 06/27/20 1646)  ondansetron (ZOFRAN) injection 4 mg (4 mg Intravenous Given 06/27/20 1645)    ED Course  I have reviewed the triage vital signs and the nursing notes.  Pertinent labs & imaging results that were available during my care of the patient were reviewed by me and considered in my medical decision making (see chart for details).    MDM Rules/Calculators/A&P                          Patient's labs and imaging were reviewed and discussed with patient, EKG is normal, head CT and C-spine imaging negative for acute injury.  She has no shortness of breath, no complaints of chest pain or palpitations and her vital signs have been stable here, doubt syncope was related to a PE, no risk factors or exam findings to suggest PE or DVT,, also ACS is unlikely.  Patient is ambulatory in the department, no orthostasis here.  Patient had an episode of syncope of unclear etiology, suspect there may have been an anxiety component given her recent increase stressors.  She was symptom-free during this ED visit.  She does have an acute cystitis and reports she has had several recently, most recently was antibiotics in June for the same concern.  She has been  referred to [facility], in fact just this past week they called for an appointment but she missed the call.  She was encouraged to call them on Monday to schedule an office visit, in the interim she was placed on Macrobid for treatment.  Strict return precautions were discussed. Final Clinical Impression(s) / ED Diagnoses Final diagnoses:  Acute cystitis with hematuria  Syncope, unspecified syncope type    Rx / DC Orders ED Discharge Orders         Ordered    nitrofurantoin, macrocrystal-monohydrate, (MACROBID) 100 MG capsule  2 times daily        06/27/20 1832           Evalee Jefferson, Hershal Coria 06/27/20 1912    Noemi Chapel, MD 06/29/20 [phone]

## 2020-06-27 NOTE — Discharge Instructions (Addendum)
Take the entire course of the antibiotic prescribed and make sure you are drinking plenty of fluids.  Get rechecked for any new or worsening symptoms.

## 2020-06-27 NOTE — ED Triage Notes (Signed)
Pt reports vertigo x 3 days   Also exposed to covid She has had vaccines   Per EMS , pt drove to family home walked in and fell to floor  Per family pt fell to floor never LOC Per pt she does not remember   Pt w chronic chron's  VS per EMS, WNL Monitor NSR

## 2020-06-30 ENCOUNTER — Other Ambulatory Visit: Payer: Self-pay | Admitting: Orthopaedic Surgery

## 2020-06-30 ENCOUNTER — Encounter: Payer: Self-pay | Admitting: Nurse Practitioner

## 2020-06-30 DIAGNOSIS — M25551 Pain in right hip: Secondary | ICD-10-CM

## 2020-07-08 ENCOUNTER — Other Ambulatory Visit: Payer: Self-pay

## 2020-07-08 ENCOUNTER — Ambulatory Visit: Payer: Self-pay

## 2020-07-08 ENCOUNTER — Telehealth: Payer: Medicare Other

## 2020-07-08 DIAGNOSIS — I10 Essential (primary) hypertension: Secondary | ICD-10-CM

## 2020-07-08 DIAGNOSIS — K5180 Other ulcerative colitis without complications: Secondary | ICD-10-CM

## 2020-07-08 DIAGNOSIS — G4700 Insomnia, unspecified: Secondary | ICD-10-CM

## 2020-07-09 ENCOUNTER — Telehealth: Payer: Self-pay | Admitting: Pharmacist

## 2020-07-09 ENCOUNTER — Other Ambulatory Visit: Payer: Self-pay | Admitting: Nurse Practitioner

## 2020-07-09 DIAGNOSIS — F419 Anxiety disorder, unspecified: Secondary | ICD-10-CM

## 2020-07-09 MED ORDER — BUSPIRONE HCL 5 MG PO TABS: 5.0000 mg | ORAL_TABLET | Freq: Two times a day (BID) | ORAL | 1 refills | Status: DC

## 2020-07-09 NOTE — Patient Instructions (Signed)
Visit Information  Goals Addressed      Patient Stated   .  "figure out why I am getting dizzy and passing out" (pt-stated)        CARE PLAN ENTRY (see longitudinal plan of care for additional care plan information)  Current Barriers:  [name] Knowledge Deficits related to evaluate and treat symptoms of vertigo and syncope . Chronic Disease Management support and education needs related to Essential hypertension, Ulcerative Colitis w/o complication, right-sided low back pain with right-sided sciatica  . Film/video editor.  . Anxiety   Nurse Case Manager Clinical Goal(s):  [name] Over the next 90 days, patient will work with the CCM team and PCP to address needs related to evaluation and treatment for vertigo and syncope  CCM RN CM Interventions:  07/08/20 call completed with patient  . Inter-disciplinary care team collaboration (see longitudinal plan of care) . Evaluation of current treatment plan related to Vertigo and Syncope and patient's adherence to plan as established by provider. . Provided education to patient re: orthostatic hypotension and how to check for this condition with home BP monitoring; Discussed patient will check her BP's at home as directed and record readings to discuss at next call; Educated on importance of drinking plenty of water to stay well hydrated and to reduce the risk for having orthostatic hypotensive episodes . Reviewed medications with patient and discussed patient is adhering to her prescribed regimen; Educated patient on potential SE that may occur with some of her medications; Medication recon completed; Determined patient's newest medications added include Macrobid and Restoril  . Collaborated with PCP [name] FNP via secure chat regarding patient's reported symptoms and interest in titrating off Restoril  . Discussed plans with patient for ongoing care management follow up and provided patient with direct contact information for care management  team 07/09/20 Case Collaboration  . Received message from PCP [name], FNP stating the patient may titrate off her Restoril and start Magnesium at bedtime  . Successful call completed with patient to inform her of the recommendations to titrate off Restoril and start OTC Magnesium 200 mg at bedtime if desired to help with insomnia    Patient Self Care Activities:  . Self administers medications as prescribed . Attends all scheduled provider appointments . Calls pharmacy for medication refills . Calls provider office for new concerns or questions . Lacks social connections  Initial goal documentation     .  "to be less anxious and have some hope" (pt-stated)        CARE PLAN ENTRY (see longitudinal plan of care for additional care plan information)  Current Barriers:  [name] Knowledge Deficits related to evaluation and treatment for anxiety and depression  . Chronic Disease Management support and education needs related to Essential hypertension, Ulcerative Colitis w/o complication, right-sided low back pain with right-sided sciatica  . Film/video editor.   Nurse Case Manager Clinical Goal(s):  [name] Over the next 180 days, patient will work with the CCM team and PCP to address needs related to disease education and support for improved Self Health management of depression and anxiety   CCM RN CM Interventions:  07/08/20 call completed with patient  . Inter-disciplinary care team collaboration (see longitudinal plan of care) . Evaluation of current treatment plan related to anxiety and depression  and patient's adherence to plan as established by provider. . Provided education to patient re: recommendations for treatment of anxiety/depression: Determined patient would like to start a prescribed medication as determined by  PCP for symptom management; Determined patient is interested in talk therapy if available while uninsured . Reviewed medications with patient and discussed patient is  not currently prescribed an anti-anxiety/anti-depressant for symptom management related to anxiety/depression  . Collaborated with [name] FNP via secure chat regarding recommendations for starting patient on an anti-anxiety/anti-depressant for symptom management: Collaborated with embedded BSW [name] regarding outreach to patient to discuss any available resources that may allow patient to receive counseling while uninsured  . Discussed plans with patient for ongoing care management follow up and provided patient with direct contact information for care management team 07/09/20 Case Collaboration . Message received from PCP [name], FNP stating she is prescribing Buspirone for patient to take once daily x 1 week then increase to twice daily and return to the office in 4 weeks for evaluation of medication effectiveness  . Successful call placed to patient, informed patient of prescribed anti-anxiety Buspirone; Educated on indication, dosage and frequency as prescribed by PCP, patient verbalizes understanding . Discussed PCP would like to see her in 4 weeks for evaluation of her symptoms and mediation effectiveness   Patient Self Care Activities:  . Self administers medications as prescribed . Attends all scheduled provider appointments . Calls pharmacy for medication refills . Calls provider office for new concerns or questions . Lacks social connections  Initial goal documentation     .  "to get help with resources" (pt-stated)        CARE PLAN ENTRY (see longitudinal plan of care for additional care plan information)  Current Barriers:  [name] Knowledge Deficits related to resources to assist with receiving healthcare while being uninsured  . Chronic Disease Management support and education needs related to Essential hypertension, Ulcerative Colitis w/o complication, right-sided low back pain with right-sided sciatica  . Film/video editor.  . Anxiety   Nurse Case Manager  Clinical Goal(s):  [name] Over the next 45 days, patient will work with the embedded BSW [name] to address needs related to community resources related to healthcare availability for the uninsured  CCM RN CM Interventions:  07/08/20 call completed with patient  . Inter-disciplinary care team collaboration (see longitudinal plan of care) . Evaluation of current treatment plan related to health maintenance and patient's adherence to plan as established by provider. [name] Collaborated with [name] BSW  regarding patient's request for assistance with resources needed for ongoing health maintenance while uninsured . Discussed plans with patient for ongoing care management follow up and provided patient with direct contact information for care management team  Patient Self Care Activities:  . Self administers medications as prescribed . Attends all scheduled provider appointments . Calls pharmacy for medication refills . Calls provider office for new concerns or questions . Lacks social connections  Initial goal documentation       Patient verbalizes understanding of instructions provided today.   Telephone follow up appointment with care management team member scheduled for: 07/24/20  Barb Merino, RN, BSN, CCM Care Management Coordinator [facility]  Direct Phone: [phone]

## 2020-07-09 NOTE — Chronic Care Management (AMB) (Signed)
Care Management   Follow Up Note   07/08/2020 Name: Gabrielle Hawkins MRN: [id number] DOB: June 28, 1966  Referred by: Gabrielle Brine, FNP Reason for referral : Chronic Care Management (CCM RNCM FU Call )   Gabrielle Hawkins is a 54 y.o. year old female who is a primary care patient of Gabrielle Hawkins, [place]. The CCM team was consulted for assistance with chronic disease management and care coordination needs.    Review of patient status, including review of consultants reports, relevant laboratory and other test results, and collaboration with appropriate care team members and the patient's provider was performed as part of comprehensive patient evaluation and provision of chronic care management services.    SDOH (Social Determinants of Health) assessments performed: Yes See Care Plan activities for detailed interventions related to [name])   Placed CCM RN CM outbound call to patient for a care plan update. Patient is currently uninsured. CCM eligibility status changed to "not eligible". Patient will continue to be followed due meeting other criteria for chronic conditions.    Outpatient Encounter Medications as of 07/08/2020  Medication Sig Note  . albuterol (VENTOLIN HFA) 108 (90 Base) MCG/ACT inhaler Inhale 2 puffs into the lungs every 6 (six) hours as needed for wheezing or shortness of breath.   [name] amitriptyline (ELAVIL) 25 MG tablet Take 25 mg by mouth at bedtime.    . beclomethasone (QVAR) 80 MCG/ACT inhaler Inhale 1 puff into the lungs daily as needed (Shortness of Breath).    . celecoxib (CELEBREX) 200 MG capsule Take 200 mg by mouth 2 (two) times daily.    [name] dicyclomine (BENTYL) 20 MG tablet Take 20 mg by mouth 4 (four) times daily -  before meals and at bedtime. Up to four times daily as needed 07/09/2020: Patient is taking 1-2 times daily   . diphenhydrAMINE (BENADRYL) 25 mg capsule Take 25 mg by mouth daily as needed for itching.    . folic acid (FOLVITE) 1 MG tablet Take 3 mg by  mouth daily.    [name] gabapentin (NEURONTIN) 300 MG capsule Take 2 capsules (600 mg total) by mouth 3 (three) times daily.   . methotrexate (50 MG/ML) 1 g injection Inject into the vein every Monday. 0.65m every Monday   . nitrofurantoin, macrocrystal-monohydrate, (MACROBID) 100 MG capsule Take 1 capsule (100 mg total) by mouth 2 (two) times daily.   .Marland Kitchenolmesartan (BENICAR) 20 MG tablet Take 1 tablet (20 mg total) by mouth daily.   . ondansetron (ZOFRAN) 4 MG tablet Take 4 mg by mouth every 6 (six) hours as needed for nausea or vomiting.   .[name] (ZANAFLEX) 2 MG tablet Take 2 mg by mouth 3 (three) times daily. 07/09/2020: Patient is taking once daily at bedtime   . cetirizine (ZYRTEC) 10 MG tablet Take 10 mg by mouth daily.   .Marland KitchenHYDROcodone-acetaminophen (NORCO) 7.5-325 MG tablet Take 1 tablet by mouth every 6 (six) hours as needed for moderate pain. (Patient not taking: Reported on 07/09/2020)   . inFLIXimab (REMICADE) 100 MG injection Inject 100 mg into the vein every 30 (thirty) days. Infuse by intravenous route every 4 weeks  Patient is receiving Inflectra brand verses Remicade brand due to insurance (Patient not taking: Reported on 04/10/2020)   . inFLIXimab in sodium chloride 0.9 % Inject 10 mg/kg into the vein every 30 (thirty) days. Infuse every 4 weeks (Patient not taking: Reported on 07/09/2020)   . loperamide (IMODIUM) 2 MG capsule Take 1 capsule (2 mg total) by  mouth 4 (four) times daily as needed for diarrhea or loose stools.   . meloxicam (MOBIC) 15 MG tablet Take 15 mg by mouth daily. (Patient not taking: Reported on 04/10/2020)   . temazepam (RESTORIL) 7.5 MG capsule Take 1 capsule (7.5 mg total) by mouth at bedtime as needed for sleep. (Patient not taking: Reported on 07/09/2020)    No facility-administered encounter medications on file as of 07/08/2020.     Objective:  No results found for: HGBA1C Lab Results  Component Value Date   MICROALBUR 10 06/04/2019   LDLCALC 124 (H)  12/24/2009   CREATININE 0.93 06/27/2020   BP Readings from Last 3 Encounters:  06/27/20 120/77  05/31/20 (!) 111/59  02/26/20 (!) 132/94    Goals Addressed      Patient Stated   .  "figure out why I am getting dizzy and passing out" (pt-stated)        CARE PLAN ENTRY (see longitudinal plan of care for additional care plan information)  Current Barriers:  [name] Knowledge Deficits related to evaluate and treat symptoms of vertigo and syncope . Chronic Disease Management support and education needs related to Essential hypertension, Ulcerative Colitis w/o complication, right-sided low back pain with right-sided sciatica  . Film/video editor.  . Anxiety   Nurse Case Manager Clinical Goal(s):  [name] Over the next 90 days, patient will work with the CCM team and PCP to address needs related to evaluation and treatment for vertigo and syncope  CCM RN CM Interventions:  07/08/20 call completed with patient  . Inter-disciplinary care team collaboration (see longitudinal plan of care) . Evaluation of current treatment plan related to Vertigo and Syncope and patient's adherence to plan as established by provider. . Provided education to patient re: orthostatic hypotension and how to check for this condition with home BP monitoring; Discussed patient will check her BP's at home as directed and record readings to discuss at next call; Educated on importance of drinking plenty of water to stay well hydrated and to reduce the risk for having orthostatic hypotensive episodes . Reviewed medications with patient and discussed patient is adhering to her prescribed regimen; Educated patient on potential SE that may occur with some of her medications; Medication recon completed; Determined patient's newest medications added include Macrobid and Restoril  . Collaborated with PCP Gabrielle Brine FNP via secure chat regarding patient's reported symptoms and interest in titrating off Restoril  . Discussed plans with  patient for ongoing care management follow up and provided patient with direct contact information for care management team 07/09/20 Case Collaboration  . Received message from PCP Gabrielle Brine, FNP stating the patient may titrate off her Restoril and start Magnesium at bedtime  . Successful call completed with patient to inform her of the recommendations to titrate off Restoril and start OTC Magnesium 200 mg at bedtime if desired to help with insomnia    Patient Self Care Activities:  . Self administers medications as prescribed . Attends all scheduled provider appointments . Calls pharmacy for medication refills . Calls provider office for new concerns or questions . Lacks social connections  Initial goal documentation     .  "to be less anxious and have some hope" (pt-stated)        CARE PLAN ENTRY (see longitudinal plan of care for additional care plan information)  Current Barriers:  [name] Knowledge Deficits related to evaluation and treatment for anxiety and depression  . Chronic Disease Management support and education needs related to  Essential hypertension, Ulcerative Colitis w/o complication, right-sided low back pain with right-sided sciatica  . Film/video editor.   Nurse Case Manager Clinical Goal(s):  [name] Over the next 180 days, patient will work with the CCM team and PCP to address needs related to disease education and support for improved Self Health management of depression and anxiety   CCM RN CM Interventions:  07/08/20 call completed with patient  . Inter-disciplinary care team collaboration (see longitudinal plan of care) . Evaluation of current treatment plan related to anxiety and depression  and patient's adherence to plan as established by provider. . Provided education to patient re: recommendations for treatment of anxiety/depression: Determined patient would like to start a prescribed medication as determined by PCP for symptom management; Determined patient is  interested in talk therapy if available while uninsured . Reviewed medications with patient and discussed patient is not currently prescribed an anti-anxiety/anti-depressant for symptom management related to anxiety/depression  . Collaborated with Gabrielle Brine FNP via secure chat regarding recommendations for starting patient on an anti-anxiety/anti-depressant for symptom management: Collaborated with embedded BSW [name] regarding outreach to patient to discuss any available resources that may allow patient to receive counseling while uninsured  . Discussed plans with patient for ongoing care management follow up and provided patient with direct contact information for care management team 07/09/20 Case Collaboration . Message received from PCP Gabrielle Brine, FNP stating she is prescribing Buspirone for patient to take once daily x 1 week then increase to twice daily and return to the office in 4 weeks for evaluation of medication effectiveness  . Successful call placed to patient, informed patient of prescribed anti-anxiety Buspirone; Educated on indication, dosage and frequency as prescribed by PCP, patient verbalizes understanding . Discussed PCP would like to see her in 4 weeks for evaluation of her symptoms and mediation effectiveness   Patient Self Care Activities:  . Self administers medications as prescribed . Attends all scheduled provider appointments . Calls pharmacy for medication refills . Calls provider office for new concerns or questions . Lacks social connections  Initial goal documentation     .  "to get help with resources" (pt-stated)        CARE PLAN ENTRY (see longitudinal plan of care for additional care plan information)  Current Barriers:  [name] Knowledge Deficits related to resources to assist with receiving healthcare while being uninsured  . Chronic Disease Management support and education needs related to Essential hypertension, Ulcerative Colitis w/o  complication, right-sided low back pain with right-sided sciatica  . Film/video editor.  . Anxiety   Nurse Case Manager Clinical Goal(s):  [name] Over the next 45 days, patient will work with the embedded BSW [name] to address needs related to community resources related to healthcare availability for the uninsured  CCM RN CM Interventions:  07/08/20 call completed with patient  . Inter-disciplinary care team collaboration (see longitudinal plan of care) . Evaluation of current treatment plan related to health maintenance and patient's adherence to plan as established by provider. [name] Collaborated with [name] BSW  regarding patient's request for assistance with resources needed for ongoing health maintenance while uninsured . Discussed plans with patient for ongoing care management follow up and provided patient with direct contact information for care management team  Patient Self Care Activities:  . Self administers medications as prescribed . Attends all scheduled provider appointments . Calls pharmacy for medication refills . Calls provider office for new concerns or questions . Lacks social connections  Initial goal documentation       Plan:   Telephone follow up appointment with care management team member scheduled for: 07/10/20  Barb Merino, RN, BSN, CCM Care Management Coordinator [facility]  Direct Phone: [phone]

## 2020-07-09 NOTE — Chronic Care Management (AMB) (Signed)
Chronic Care Management Pharmacy Assistant   Name: Gabrielle Hawkins  MRN: [id number] DOB: 25-Dec-1965  Reason for Encounter: Medication Review - General Adherence Call   PCP : Gabrielle Brine, FNP  Allergies:   Allergies  Allergen Reactions  . Humira [Adalimumab] Anaphylaxis  . Ampicillin Hives and Itching  . Codeine   . Compazine [Prochlorperazine Maleate] Other (See Comments)    "Stroke-like symptoms" Can tolerate Phenergan.  [name] Cymbalta [Duloxetine Hcl] Other (See Comments)    Fainting  . Imitrex [Sumatriptan Base] Hives and Nausea And Vomiting  . Lyrica [Pregabalin] Other (See Comments)    Fainting  . Metoclopramide Hcl Hives  . Percocet [Oxycodone-Acetaminophen] Itching  . Effexor [Venlafaxine Hydrochloride] Hives and Palpitations  . Tape Itching and Rash    Medications: Outpatient Encounter Medications as of 07/09/2020  Medication Sig  . albuterol (VENTOLIN HFA) 108 (90 Base) MCG/ACT inhaler Inhale 2 puffs into the lungs every 6 (six) hours as needed for wheezing or shortness of breath.  [name] amitriptyline (ELAVIL) 25 MG tablet Take 25 mg by mouth at bedtime.   . beclomethasone (QVAR) 80 MCG/ACT inhaler Inhale 1 puff into the lungs daily as needed (Shortness of Breath). (Patient not taking: Reported on 04/10/2020)  . celecoxib (CELEBREX) 200 MG capsule Take 200 mg by mouth 2 (two) times daily.   . cetirizine (ZYRTEC) 10 MG tablet Take 10 mg by mouth daily.  [name] dicyclomine (BENTYL) 20 MG tablet Take 20 mg by mouth 4 (four) times daily -  before meals and at bedtime. Up to four times daily as needed  . diphenhydrAMINE (BENADRYL) 25 mg capsule Take 25 mg by mouth daily as needed for itching. (Patient not taking: Reported on 04/10/2020)  . folic acid (FOLVITE) 1 MG tablet Take 3 mg by mouth daily.   [name] gabapentin (NEURONTIN) 300 MG capsule Take 2 capsules (600 mg total) by mouth 3 (three) times daily.  [name] HYDROcodone-acetaminophen (NORCO) 7.5-325 MG tablet Take 1 tablet by mouth  every 6 (six) hours as needed for moderate pain.  [name] inFLIXimab (REMICADE) 100 MG injection Inject 100 mg into the vein every 30 (thirty) days. Infuse by intravenous route every 4 weeks  Patient is receiving Inflectra brand verses Remicade brand due to insurance (Patient not taking: Reported on 04/10/2020)  . inFLIXimab in sodium chloride 0.9 % Inject 10 mg/kg into the vein every 30 (thirty) days. Infuse every 4 weeks  . loperamide (IMODIUM) 2 MG capsule Take 1 capsule (2 mg total) by mouth 4 (four) times daily as needed for diarrhea or loose stools.  . meloxicam (MOBIC) 15 MG tablet Take 15 mg by mouth daily. (Patient not taking: Reported on 04/10/2020)  . methotrexate (50 MG/ML) 1 g injection Inject into the vein every Monday. 0.40m every Monday  . nitrofurantoin, macrocrystal-monohydrate, (MACROBID) 100 MG capsule Take 1 capsule (100 mg total) by mouth 2 (two) times daily.  .Marland Kitchenolmesartan (BENICAR) 20 MG tablet Take 1 tablet (20 mg total) by mouth daily.  . ondansetron (ZOFRAN) 4 MG tablet Take 4 mg by mouth every 6 (six) hours as needed for nausea or vomiting.  . temazepam (RESTORIL) 7.5 MG capsule Take 1 capsule (7.5 mg total) by mouth at bedtime as needed for sleep.  .[name] (ZANAFLEX) 2 MG tablet Take 2 mg by mouth 3 (three) times daily.   No facility-administered encounter medications on file as of 07/09/2020.    Current Diagnosis: Patient Active Problem List   Diagnosis Date Noted  . Right-sided  low back pain with right-sided sciatica 05/06/2019  . Right sided numbness 03/27/2019  . Paresthesia 12/25/2018  . Essential hypertension 06/26/2018  . Abdominal pain, chronic, right lower quadrant 01/06/2015  . Nausea and vomiting 01/05/2015  . Ulcerative colitis ([place]) 01/05/2015  . Enteritis   . Nausea with vomiting   . Intractable nausea and vomiting 04/29/2014  . Benign paroxysmal positional vertigo 03/14/2013  . Lymphedema of arm 12/30/2011   07/09/2020 - Patient called pharmacy  states she does not want her medication delivered, because she does not have insurance and can not afford them. States she has tried to work and can not so now she has no insurance or income.  07/09/2020- called patient left message to call me back.  07/10/2020- Called patient to check on her and to make sure she was not without medications , per patient she states that she has no insurance, or no way to pay for her medications ,  We reviewed list of medications and patient has enough medications for the rest of November and December 2021 she will be out January 1,2022, she states that she has a hearing for disability September 30, 2020, but she is not for sure what she will do from 09/05/2020 till approved. Patient states she has appointment with Social Worker 07/21/2020, she will call us back in few weeks to set up an appointment with Gabrielle Hawkins,CPP . I gave patient my number to call me if I can help her in anyway.   Follow-Up:  Pharmacist Review -   Gabrielle Hawkins,CPP. Notified.  Gabrielle Hawkins, [facility] Clinical Pharmacist Assistant [phone]

## 2020-07-10 ENCOUNTER — Ambulatory Visit: Payer: Medicare Other

## 2020-07-10 DIAGNOSIS — K5180 Other ulcerative colitis without complications: Secondary | ICD-10-CM

## 2020-07-10 DIAGNOSIS — I10 Essential (primary) hypertension: Secondary | ICD-10-CM

## 2020-07-10 NOTE — Patient Instructions (Signed)
Visit Information  Goals Addressed              This Visit's Progress     Patient Stated   .  COMPLETED: "I got a letter they are denying me for disability" (pt-stated)        CARE PLAN ENTRY (see longitudinal plan of care for additional care plan information)  Current Barriers:  . Chronic conditions including HTN, Ulcerative Colitis, and Crohn's disease which impact patients ability to work . Unknown reason for decision to decline further disability payment after case review  Social Work Clinical Goal(s):  [name] Over the next 5 days the patient will work with a Media planner to initiate appeal process Goal Met . New 05/06/20 Over the next 60 days the patient will work with [place] to complete appeals process regarding Disability review   07/10/20- See new SW goal for ongoing updates regarding care coordination needs  CCM SW Interventions: Completed 06/16/20 . Successful outbound call placed to the patient to assess goal progression . Determined the patient is no longer pursuing Disability appeal due to concern surrounding lawyer fees . 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 Almyra Free is concerned she may not be able to perform full-time job duties . Discussed patient concern over possibility of needing back surgery o "Dr. Eugenio Hoes says he does not think dry needling or therapy will help" o Patient reports plans to see Dr. Jon Billings tomorrow regarding possibility of upcoming surgery due to back pain . Collaboration with [place] regarding patient plans to return to work and possibility of back surgery being needed in the near future . Scheduled follow up call to the patient over the next month   Patient Self Care  Activities:  . Patient verbalizes understanding of plan to follow up with lawyer regarding appeal . Self administers medications as prescribed . Attends all scheduled provider appointments . Calls pharmacy for medication refills . Performs ADL's independently  Please see past updates related to this goal by clicking on the "Past Updates" button in the selected goal        Other   .  Collaborate with RN Care Manager to perform appropriate assessments to assist with care coordination needs        CARE PLAN ENTRY (see longitudinal plan of care for additional care plan information)  Current Barriers:  . Financial constraints related to insurance coverage . Recent denial of disability benefits . Lacks knowledge of community resources to assist with costs of medical care . Chronic conditions including HTN, Crohn's Disease, and Ulcerative Colitis which put patient at increased risk for hospitalization  Social Work Clinical Goal(s):  [name] Over the next 120 days the patient will work with SW to address ongoing care coordination needs  SW Interventions: Completed 07/10/20 . Inter-disciplinary care team collaboration (see longitudinal plan of care) . Collaboration with RN Care Manager [place] who reports patient needs assistance identifying resources to assist with cost of healthcare due to denial of Medicaid and cost of Medicare premium . Successful outbound call placed to the patient to assess for care coordination needs . Determined the patient is no longer working or receiving disability income o Patient reports her disability appeals hearing is scheduled  for January 26th o The patient will be representing herself as she does not have funds to hire an attorney o The patient had previous interaction with [place] but has not been able to stay in contact with Mrs. Glenford Peers. The patient states they have not return her calls or e-mails . Discussed the patient applied for  Medicaid but was denied for full coverage due to the number of assets in her name, mainly family land o Although Medicaid has denied the patient for full coverage, it will pay the cost of her Part B Medicare premium o The patient reports she received a letter stating she must pay $594 by October 25th to keep Part A coverage o The patient has yet to pay this but reports her dad and brother plan to help cover this amount in order for the patient to keep her health coverage o Assessed if this is a monthly cost of $594- the patient is unsure but reports she will not be able to afford this cost monthly . Determined the patient has not received her infusion since September due to coverage lapse but is hoping to resume these in the near future . The patient reports she has recently started a new medication for anxiety and is not feeling well o Discussed the patient not sleeping well and feeling tired o Advised the patient to contact her PCP on Monday if she continues to feel bad due to medication change . Discussed concern for ongoing financial hardship in the event the patients appeal is denied for disability benefits o The patient reports she does not have much in savings and has been using her credit cards to afford cost of living at this time o The patient applied for benefits under Food and Nutrition services and was awarded $20 per month to assist with the cost of food . Advised the patient SW would look into resources to assist with medical care in her county  . Successful outreach placed to [place] there is a Primary Care division which will see patients for primary care needs. Cost is a sliding scale based on income o Discussed patient need to see specialists and inquired if there are programs to assist with this o Informed the [facility] Dept contracts with [place] program to link patients to specific specialists within the [facility]  network . Successful outbound call placed to the patient to provide this information o Mailed the patient information on health department primary care program as well as [facility] application and program information . Scheduled follow up call over the next 3 weeks  Patient Self Care Activities:  . Patient verbalizes understanding of plan to contact SW as needed prior to next scheduled call . Self administers medications as prescribed . Attends all scheduled provider appointments . Calls pharmacy for medication refills . Performs ADL's independently . Calls provider office for new concerns or questions  Initial goal documentation        SW will follow up with the patient over the next 3 weeks.  [name], BSW, CDP Social Worker, Certified Dementia Practitioner [place] [facility] [place] [facility]

## 2020-07-10 NOTE — Chronic Care Management (AMB) (Signed)
Care Management    Social Work Follow Up Note  07/10/2020 Name: MAZZY SANTARELLI MRN: [id number] DOB: 12-19-1965  DALLIS DARDEN is a 54 y.o. year old female who is a primary care patient of [name], [place]. The CCM team was consulted for assistance with care coordination.   Review of patient status, including review of consultants reports, other relevant assessments, and collaboration with appropriate care team members and the patient's provider was performed as part of comprehensive patient evaluation and provision of chronic care management services.    SDOH (Social Determinants of Health) assessments performed: No    Outpatient Encounter Medications as of 07/10/2020  Medication Sig Note  . albuterol (VENTOLIN HFA) 108 (90 Base) MCG/ACT inhaler Inhale 2 puffs into the lungs every 6 (six) hours as needed for wheezing or shortness of breath.   [name] amitriptyline (ELAVIL) 25 MG tablet Take 25 mg by mouth at bedtime.    . beclomethasone (QVAR) 80 MCG/ACT inhaler Inhale 1 puff into the lungs daily as needed (Shortness of Breath).    . busPIRone (BUSPAR) 5 MG tablet Take 1 tablet (5 mg total) by mouth 2 (two) times daily.   . celecoxib (CELEBREX) 200 MG capsule Take 200 mg by mouth 2 (two) times daily.    . cetirizine (ZYRTEC) 10 MG tablet Take 10 mg by mouth daily.   [name] dicyclomine (BENTYL) 20 MG tablet Take 20 mg by mouth 4 (four) times daily -  before meals and at bedtime. Up to four times daily as needed 07/09/2020: Patient is taking 1-2 times daily   . diphenhydrAMINE (BENADRYL) 25 mg capsule Take 25 mg by mouth daily as needed for itching.    . folic acid (FOLVITE) 1 MG tablet Take 3 mg by mouth daily.    [name] gabapentin (NEURONTIN) 300 MG capsule Take 2 capsules (600 mg total) by mouth 3 (three) times daily.   [name] HYDROcodone-acetaminophen (NORCO) 7.5-325 MG tablet Take 1 tablet by mouth every 6 (six) hours as needed for moderate pain. (Patient not taking: Reported on 07/09/2020)   .  inFLIXimab (REMICADE) 100 MG injection Inject 100 mg into the vein every 30 (thirty) days. Infuse by intravenous route every 4 weeks  Patient is receiving Inflectra brand verses Remicade brand due to insurance (Patient not taking: Reported on 04/10/2020)   . inFLIXimab in sodium chloride 0.9 % Inject 10 mg/kg into the vein every 30 (thirty) days. Infuse every 4 weeks (Patient not taking: Reported on 07/09/2020)   . loperamide (IMODIUM) 2 MG capsule Take 1 capsule (2 mg total) by mouth 4 (four) times daily as needed for diarrhea or loose stools.   . meloxicam (MOBIC) 15 MG tablet Take 15 mg by mouth daily. (Patient not taking: Reported on 04/10/2020)   . methotrexate (50 MG/ML) 1 g injection Inject into the vein every Monday. 0.71m every Monday   . nitrofurantoin, macrocrystal-monohydrate, (MACROBID) 100 MG capsule Take 1 capsule (100 mg total) by mouth 2 (two) times daily.   .Marland Kitchenolmesartan (BENICAR) 20 MG tablet Take 1 tablet (20 mg total) by mouth daily.   . ondansetron (ZOFRAN) 4 MG tablet Take 4 mg by mouth every 6 (six) hours as needed for nausea or vomiting.   . temazepam (RESTORIL) 7.5 MG capsule Take 1 capsule (7.5 mg total) by mouth at bedtime as needed for sleep. (Patient not taking: Reported on 07/09/2020)   . tiZANidine (ZANAFLEX) 2 MG tablet Take 2 mg by mouth 3 (three) times daily. 07/09/2020: Patient is  taking once daily at bedtime    No facility-administered encounter medications on file as of 07/10/2020.     Goals Addressed              This Visit's Progress     Patient Stated   .  COMPLETED: "I got a letter they are denying me for disability" (pt-stated)        CARE PLAN ENTRY (see longitudinal plan of care for additional care plan information)  Current Barriers:  . Chronic conditions including HTN, Ulcerative Colitis, and Crohn's disease which impact patients ability to work . Unknown reason for decision to decline further disability payment after case review  Social Work  Clinical Goal(s):  [name] Over the next 5 days the patient will work with a Media planner to initiate appeal process Goal Met . New 05/06/20 Over the next 60 days the patient will work with [place] to complete appeals process regarding Disability review   07/10/20- See new SW goal for ongoing updates regarding care coordination needs  CCM SW Interventions: Completed 06/16/20 . Successful outbound call placed to the patient to assess goal progression . Determined the patient is no longer pursuing Disability appeal due to concern surrounding lawyer fees . 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 Almyra Free is concerned she may not be able to perform full-time job duties . Discussed patient concern over possibility of needing back surgery o "Dr. Eugenio Hoes says he does not think dry needling or therapy will help" o Patient reports plans to see Dr. Jon Billings tomorrow regarding possibility of upcoming surgery due to back pain . Collaboration with [place] regarding patient plans to return to work and possibility of back surgery being needed in the near future . Scheduled follow up call to the patient over the next month   Patient Self Care Activities:  . Patient verbalizes understanding of plan to follow up with lawyer regarding appeal . Self administers medications as prescribed . Attends all scheduled provider appointments . Calls pharmacy for medication refills . Performs ADL's independently  Please see past updates related to this goal by clicking on the "Past Updates" button in the selected goal        Other   .  Collaborate with RN Care Manager to perform appropriate assessments to assist with care coordination needs        CARE PLAN  ENTRY (see longitudinal plan of care for additional care plan information)  Current Barriers:  . Financial constraints related to insurance coverage . Recent denial of disability benefits . Lacks knowledge of community resources to assist with costs of medical care . Chronic conditions including HTN, Crohn's Disease, and Ulcerative Colitis which put patient at increased risk for hospitalization  Social Work Clinical Goal(s):  [name] Over the next 120 days the patient will work with SW to address ongoing care coordination needs  SW Interventions: Completed 07/10/20 . Inter-disciplinary care team collaboration (see longitudinal plan of care) . Collaboration with RN Care Manager [place] who reports patient needs assistance identifying resources to assist with cost of healthcare due to denial of Medicaid and cost of Medicare premium . Successful outbound call placed to the patient to assess for care coordination needs . Determined  the patient is no longer working or receiving disability income o Patient reports her disability appeals hearing is scheduled for January 26th o The patient will be representing herself as she does not have funds to hire an attorney o The patient had previous interaction with [place] but has not been able to stay in contact with Mrs. Glenford Peers. The patient states they have not return her calls or e-mails . Discussed the patient applied for Medicaid but was denied for full coverage due to the number of assets in her name, mainly family land o Although Medicaid has denied the patient for full coverage, it will pay the cost of her Part B Medicare premium o The patient reports she received a letter stating she must pay $594 by October 25th to keep Part A coverage o The patient has yet to pay this but reports her dad and brother plan to help cover this amount in order for the patient to keep her health coverage o Assessed if this is a monthly cost of $594-  the patient is unsure but reports she will not be able to afford this cost monthly . Determined the patient has not received her infusion since September due to coverage lapse but is hoping to resume these in the near future . The patient reports she has recently started a new medication for anxiety and is not feeling well o Discussed the patient not sleeping well and feeling tired o Advised the patient to contact her PCP on Monday if she continues to feel bad due to medication change . Discussed concern for ongoing financial hardship in the event the patients appeal is denied for disability benefits o The patient reports she does not have much in savings and has been using her credit cards to afford cost of living at this time o The patient applied for benefits under Food and Nutrition services and was awarded $20 per month to assist with the cost of food . Advised the patient SW would look into resources to assist with medical care in her county  . Successful outreach placed to [place] there is a Primary Care division which will see patients for primary care needs. Cost is a sliding scale based on income o Discussed patient need to see specialists and inquired if there are programs to assist with this o Informed the [facility] Dept contracts with [place] program to link patients to specific specialists within the [facility] network . Successful outbound call placed to the patient to provide this information o Mailed the patient information on health department primary care program as well as [facility] application and program information . Scheduled follow up call over the next 3 weeks  Patient Self Care Activities:  . Patient verbalizes understanding of plan to contact SW as needed prior to next scheduled call . Self administers medications as prescribed . Attends all scheduled provider appointments . Calls pharmacy for  medication refills . Performs ADL's independently . Calls provider office for new concerns or questions  Initial goal documentation         Follow Up Plan: SW will follow up with patient by phone over the next 3 weeks.  [name], BSW, CDP Social Worker, Certified Dementia Practitioner [place] [facility] [place] [facility] [phone]

## 2020-07-15 ENCOUNTER — Encounter: Payer: Medicare Other | Admitting: Nurse Practitioner

## 2020-07-17 ENCOUNTER — Telehealth: Payer: Self-pay

## 2020-07-20 ENCOUNTER — Other Ambulatory Visit: Payer: Medicare Other

## 2020-07-22 ENCOUNTER — Ambulatory Visit: Payer: Medicare Other

## 2020-07-22 DIAGNOSIS — F419 Anxiety disorder, unspecified: Secondary | ICD-10-CM

## 2020-07-22 DIAGNOSIS — I10 Essential (primary) hypertension: Secondary | ICD-10-CM

## 2020-07-22 DIAGNOSIS — K5180 Other ulcerative colitis without complications: Secondary | ICD-10-CM

## 2020-07-22 NOTE — Patient Instructions (Signed)
Social Worker Visit Information  Goals we discussed today:  Goals Addressed            This Visit's Progress   . Collaborate with RN Care Manager to perform appropriate assessments to assist with care coordination needs   On track    [place] (see longitudinal plan of care for additional care plan information)  Current Barriers:  . Financial constraints related to insurance coverage . Recent denial of disability benefits . Lacks knowledge of community resources to assist with costs of medical care . Chronic conditions including HTN, Crohn's Disease, and Ulcerative Colitis which put patient at increased risk for hospitalization  Social Work Clinical Goal(s):  [name] Over the next 120 days the patient will work with SW to address ongoing care coordination needs  SW Interventions: Completed 07/22/20 . Successful outbound call placed to the patient to assist with care coordination needs . Confirmed receipt of mailed resources o The patient reports she does not plan to follow up with [facility] Department for primary care or [facility] for specialty care at this time . Discussed the patient is currently in process of completing new hire paperwork to begin working from home for a company called "[employer] o The patient has completed a phone interview and drug screen o The patient is awaiting information on start date as well as orientation schedule o She is very optimistic about this job as she can work from home and continue to manage infusions  . Determined the patient is interested in telephonic resources to assist with counseling o Mailed the patient a [facility] guide for review  Completed 07/10/20 . Inter-disciplinary care team collaboration (see longitudinal plan of care) . Collaboration with RN Care Manager [place] who reports patient needs assistance identifying resources to assist with cost of healthcare due to denial of Medicaid and cost of  Medicare premium . Successful outbound call placed to the patient to assess for care coordination needs . Determined the patient is no longer working or receiving disability income o Patient reports her disability appeals hearing is scheduled for January 26th o The patient will be representing herself as she does not have funds to hire an attorney o The patient had previous interaction with [place] but has not been able to stay in contact with Mrs. Glenford Peers. The patient states they have not return her calls or e-mails . Discussed the patient applied for Medicaid but was denied for full coverage due to the number of assets in her name, mainly family land o Although Medicaid has denied the patient for full coverage, it will pay the cost of her Part B Medicare premium o The patient reports she received a letter stating she must pay $594 by October 25th to keep Part A coverage o The patient has yet to pay this but reports her dad and brother plan to help cover this amount in order for the patient to keep her health coverage o Assessed if this is a monthly cost of $594- the patient is unsure but reports she will not be able to afford this cost monthly . Determined the patient has not received her infusion since September due to coverage lapse but is hoping to resume these in the near future . The patient reports she has recently started a new medication for anxiety and is not feeling well o Discussed the patient not sleeping well and feeling tired o Advised the patient to contact her PCP on  Monday if she continues to feel bad due to medication change . Discussed concern for ongoing financial hardship in the event the patients appeal is denied for disability benefits o The patient reports she does not have much in savings and has been using her credit cards to afford cost of living at this time o The patient applied for benefits under Food and Nutrition services and was awarded $20 per  month to assist with the cost of food . Advised the patient SW would look into resources to assist with medical care in her county  . Successful outreach placed to [employer] there is a Primary Care division which will see patients for primary care needs. Cost is a sliding scale based on income o Discussed patient need to see specialists and inquired if there are programs to assist with this o Informed the [facility] Dept contracts with [place] program to link patients to specific specialists within the [facility] network . Successful outbound call placed to the patient to provide this information o Mailed the patient information on health department primary care program as well as [facility] application and program information . Scheduled follow up call over the next 3 weeks  Patient Self Care Activities:  . Patient verbalizes understanding of plan to contact SW as needed prior to next scheduled call . Self administers medications as prescribed . Attends all scheduled provider appointments . Calls pharmacy for medication refills . Performs ADL's independently . Calls provider office for new concerns or questions  Please see past updates related to this goal by clicking on the "Past Updates" button in the selected goal          Follow Up Plan: SW will follow up with patient by phone over the next month.   [name], BSW, CDP Social Worker, Certified Dementia Practitioner [place] [facility] [place] [facility] [phone]

## 2020-07-22 NOTE — Chronic Care Management (AMB) (Signed)
Care Management    Social Work Follow Up Note  07/22/2020 Name: Gabrielle Hawkins MRN: [id number] DOB: 1966/07/17  Gabrielle Hawkins is a 54 y.o. year old female who is a primary care patient of Gabrielle Hawkins, Gabrielle Hawkins. The CCM team was consulted for assistance with care coordination.   Review of patient status, including review of consultants reports, other relevant assessments, and collaboration with appropriate care team members and the patient's provider was performed as part of comprehensive patient evaluation and provision of chronic care management services.    SDOH (Social Determinants of Health) assessments performed: No    Outpatient Encounter Medications as of 07/22/2020  Medication Sig Note  . albuterol (VENTOLIN HFA) 108 (90 Base) MCG/ACT inhaler Inhale 2 puffs into the lungs every 6 (six) hours as needed for wheezing or shortness of breath.   [name] amitriptyline (ELAVIL) 25 MG tablet Take 25 mg by mouth at bedtime.    . beclomethasone (QVAR) 80 MCG/ACT inhaler Inhale 1 puff into the lungs daily as needed (Shortness of Breath).    . busPIRone (BUSPAR) 5 MG tablet Take 1 tablet (5 mg total) by mouth 2 (two) times daily.   . celecoxib (CELEBREX) 200 MG capsule Take 200 mg by mouth 2 (two) times daily.    . cetirizine (ZYRTEC) 10 MG tablet Take 10 mg by mouth daily.   [name] dicyclomine (BENTYL) 20 MG tablet Take 20 mg by mouth 4 (four) times daily -  before meals and at bedtime. Up to four times daily as needed 07/09/2020: Patient is taking 1-2 times daily   . diphenhydrAMINE (BENADRYL) 25 mg capsule Take 25 mg by mouth daily as needed for itching.    . folic acid (FOLVITE) 1 MG tablet Take 3 mg by mouth daily.    [name] gabapentin (NEURONTIN) 300 MG capsule Take 2 capsules (600 mg total) by mouth 3 (three) times daily.   [name] HYDROcodone-acetaminophen (NORCO) 7.5-325 MG tablet Take 1 tablet by mouth every 6 (six) hours as needed for moderate pain. (Patient not taking: Reported on 07/09/2020)   .  inFLIXimab (REMICADE) 100 MG injection Inject 100 mg into the vein every 30 (thirty) days. Infuse by intravenous route every 4 weeks  Patient is receiving Inflectra brand verses Remicade brand due to insurance (Patient not taking: Reported on 04/10/2020)   . inFLIXimab in sodium chloride 0.9 % Inject 10 mg/kg into the vein every 30 (thirty) days. Infuse every 4 weeks (Patient not taking: Reported on 07/09/2020)   . loperamide (IMODIUM) 2 MG capsule Take 1 capsule (2 mg total) by mouth 4 (four) times daily as needed for diarrhea or loose stools.   . meloxicam (MOBIC) 15 MG tablet Take 15 mg by mouth daily. (Patient not taking: Reported on 04/10/2020)   . methotrexate (50 MG/ML) 1 g injection Inject into the vein every Monday. 0.6m every Monday   . nitrofurantoin, macrocrystal-monohydrate, (MACROBID) 100 MG capsule Take 1 capsule (100 mg total) by mouth 2 (two) times daily.   .Marland Kitchenolmesartan (BENICAR) 20 MG tablet Take 1 tablet (20 mg total) by mouth daily.   . ondansetron (ZOFRAN) 4 MG tablet Take 4 mg by mouth every 6 (six) hours as needed for nausea or vomiting.   . temazepam (RESTORIL) 7.5 MG capsule Take 1 capsule (7.5 mg total) by mouth at bedtime as needed for sleep. (Patient not taking: Reported on 07/09/2020)   . tiZANidine (ZANAFLEX) 2 MG tablet Take 2 mg by mouth 3 (three) times daily. 07/09/2020: Patient is  taking once daily at bedtime    No facility-administered encounter medications on file as of 07/22/2020.     Goals Addressed            This Visit's Progress   . Collaborate with RN Care Manager to perform appropriate assessments to assist with care coordination needs   On track    [place] (see longitudinal plan of care for additional care plan information)  Current Barriers:  . Financial constraints related to insurance coverage . Recent denial of disability benefits . Lacks knowledge of community resources to assist with costs of medical care . Chronic conditions including  HTN, Crohn's Disease, and Ulcerative Colitis which put patient at increased risk for hospitalization  Social Work Clinical Goal(s):  [name] Over the next 120 days the patient will work with SW to address ongoing care coordination needs  SW Interventions: Completed 07/22/20 . Successful outbound call placed to the patient to assist with care coordination needs . Confirmed receipt of mailed resources o The patient reports she does not plan to follow up with [facility] for primary care or [facility] for specialty care at this time . Discussed the patient is currently in process of completing new hire paperwork to begin working from home for a company called "[employer] o The patient has completed a phone interview and drug screen o The patient is awaiting information on start date as well as orientation schedule o She is very optimistic about this job as she can work from home and continue to manage infusions  . Determined the patient is interested in telephonic resources to assist with counseling o Mailed the patient a [facility] guide for review  Completed 07/10/20 . Inter-disciplinary care team collaboration (see longitudinal plan of care) . Collaboration with RN Care Manager [place] who reports patient needs assistance identifying resources to assist with cost of healthcare due to denial of Medicaid and cost of Medicare premium . Successful outbound call placed to the patient to assess for care coordination needs . Determined the patient is no longer working or receiving disability income o Patient reports her disability appeals hearing is scheduled for January 26th o The patient will be representing herself as she does not have funds to hire an attorney o The patient had previous interaction with [place] but has not been able to stay in contact with Mrs. Glenford Peers. The patient states they have not return her calls or e-mails . Discussed  the patient applied for Medicaid but was denied for full coverage due to the number of assets in her name, mainly family land o Although Medicaid has denied the patient for full coverage, it will pay the cost of her Part B Medicare premium o The patient reports she received a letter stating she must pay $594 by October 25th to keep Part A coverage o The patient has yet to pay this but reports her dad and brother plan to help cover this amount in order for the patient to keep her health coverage o Assessed if this is a monthly cost of $594- the patient is unsure but reports she will not be able to afford this cost monthly . Determined the patient has not received her infusion since September due to coverage lapse but is hoping to resume these in the near future . The patient reports she has recently started a new medication for anxiety and is not feeling well o Discussed the patient not sleeping well  and feeling tired o Advised the patient to contact her PCP on Monday if she continues to feel bad due to medication change . Discussed concern for ongoing financial hardship in the event the patients appeal is denied for disability benefits o The patient reports she does not have much in savings and has been using her credit cards to afford cost of living at this time o The patient applied for benefits under Food and Nutrition services and was awarded $20 per month to assist with the cost of food . Advised the patient SW would look into resources to assist with medical care in her county  . Successful outreach placed to [place] there is a Primary Care division which will see patients for primary care needs. Cost is a sliding scale based on income o Discussed patient need to see specialists and inquired if there are programs to assist with this o Informed the [facility] Dept contracts with [place] program to link patients to specific  specialists within the [facility] network . Successful outbound call placed to the patient to provide this information o Mailed the patient information on health department primary care program as well as [facility] application and program information . Scheduled follow up call over the next 3 weeks  Patient Self Care Activities:  . Patient verbalizes understanding of plan to contact SW as needed prior to next scheduled call . Self administers medications as prescribed . Attends all scheduled provider appointments . Calls pharmacy for medication refills . Performs ADL's independently . Calls provider office for new concerns or questions  Please see past updates related to this goal by clicking on the "Past Updates" button in the selected goal          Follow Up Plan: SW will follow up with patient by phone over the next month.   [name], BSW, CDP Social Worker, Certified Dementia Practitioner [place] [facility] [place] [facility] [phone]

## 2020-07-24 ENCOUNTER — Other Ambulatory Visit: Payer: Self-pay

## 2020-07-24 ENCOUNTER — Telehealth: Payer: Medicare Other

## 2020-07-24 ENCOUNTER — Ambulatory Visit: Payer: Self-pay

## 2020-07-24 DIAGNOSIS — K5180 Other ulcerative colitis without complications: Secondary | ICD-10-CM

## 2020-07-24 DIAGNOSIS — G8929 Other chronic pain: Secondary | ICD-10-CM

## 2020-07-24 DIAGNOSIS — I10 Essential (primary) hypertension: Secondary | ICD-10-CM

## 2020-07-24 NOTE — Chronic Care Management (AMB) (Signed)
Chronic Care Management Pharmacy Assistant   Name: Gabrielle Hawkins  MRN: [id number] DOB: Feb 01, 1966  Reason for Encounter: Medication Review  Patient Questions:  1.  Have you seen any other providers since your last visit? Yes, 06/27/2020- [place] [facility], 07/08/2020- Barb Merino, RN (CCM), 07/10/2020- [name], LSW (CCM), 07/22/2020- [name], LSW (CCM).  2.  Any changes in your medicines or health? Yes, 06/27/2020- Macrobid 100 mg started.   PCP : [name], FNP  Allergies:   Allergies  Allergen Reactions  . Humira [Adalimumab] Anaphylaxis  . Ampicillin Hives and Itching  . Codeine   . Compazine [Prochlorperazine Maleate] Other (See Comments)    "Stroke-like symptoms" Can tolerate Phenergan.  [name] Cymbalta [Duloxetine Hcl] Other (See Comments)    Fainting  . Imitrex [Sumatriptan Base] Hives and Nausea And Vomiting  . Lyrica [Pregabalin] Other (See Comments)    Fainting  . Metoclopramide Hcl Hives  . Percocet [Oxycodone-Acetaminophen] Itching  . Effexor [Venlafaxine Hydrochloride] Hives and Palpitations  . Tape Itching and Rash    Medications: Outpatient Encounter Medications as of 07/17/2020  Medication Sig Note  . albuterol (VENTOLIN HFA) 108 (90 Base) MCG/ACT inhaler Inhale 2 puffs into the lungs every 6 (six) hours as needed for wheezing or shortness of breath.   [name] amitriptyline (ELAVIL) 25 MG tablet Take 25 mg by mouth at bedtime.    . beclomethasone (QVAR) 80 MCG/ACT inhaler Inhale 1 puff into the lungs daily as needed (Shortness of Breath).    . busPIRone (BUSPAR) 5 MG tablet Take 1 tablet (5 mg total) by mouth 2 (two) times daily.   . celecoxib (CELEBREX) 200 MG capsule Take 200 mg by mouth 2 (two) times daily.    . cetirizine (ZYRTEC) 10 MG tablet Take 10 mg by mouth daily.   [name] dicyclomine (BENTYL) 20 MG tablet Take 20 mg by mouth 4 (four) times daily -  before meals and at bedtime. Up to four times daily as needed 07/09/2020: Patient is taking 1-2  times daily   . diphenhydrAMINE (BENADRYL) 25 mg capsule Take 25 mg by mouth daily as needed for itching.    . folic acid (FOLVITE) 1 MG tablet Take 3 mg by mouth daily.    [name] gabapentin (NEURONTIN) 300 MG capsule Take 2 capsules (600 mg total) by mouth 3 (three) times daily.   [name] HYDROcodone-acetaminophen (NORCO) 7.5-325 MG tablet Take 1 tablet by mouth every 6 (six) hours as needed for moderate pain. (Patient not taking: Reported on 07/09/2020)   . inFLIXimab (REMICADE) 100 MG injection Inject 100 mg into the vein every 30 (thirty) days. Infuse by intravenous route every 4 weeks  Patient is receiving Inflectra brand verses Remicade brand due to insurance (Patient not taking: Reported on 04/10/2020)   . inFLIXimab in sodium chloride 0.9 % Inject 10 mg/kg into the vein every 30 (thirty) days. Infuse every 4 weeks (Patient not taking: Reported on 07/09/2020)   . loperamide (IMODIUM) 2 MG capsule Take 1 capsule (2 mg total) by mouth 4 (four) times daily as needed for diarrhea or loose stools.   . meloxicam (MOBIC) 15 MG tablet Take 15 mg by mouth daily. (Patient not taking: Reported on 04/10/2020)   . methotrexate (50 MG/ML) 1 g injection Inject into the vein every Monday. 0.63m every Monday   . nitrofurantoin, macrocrystal-monohydrate, (MACROBID) 100 MG capsule Take 1 capsule (100 mg total) by mouth 2 (two) times daily.   .Marland Kitchenolmesartan (BENICAR) 20 MG tablet Take 1  tablet (20 mg total) by mouth daily.   . ondansetron (ZOFRAN) 4 MG tablet Take 4 mg by mouth every 6 (six) hours as needed for nausea or vomiting.   . temazepam (RESTORIL) 7.5 MG capsule Take 1 capsule (7.5 mg total) by mouth at bedtime as needed for sleep. (Patient not taking: Reported on 07/09/2020)   . tiZANidine (ZANAFLEX) 2 MG tablet Take 2 mg by mouth 3 (three) times daily. 07/09/2020: Patient is taking once daily at bedtime    No facility-administered encounter medications on file as of 07/17/2020.    Current Diagnosis: Patient Active  Problem List   Diagnosis Date Noted  . Right-sided low back pain with right-sided sciatica 05/06/2019  . Right sided numbness 03/27/2019  . Paresthesia 12/25/2018  . Essential hypertension 06/26/2018  . Abdominal pain, chronic, right lower quadrant 01/06/2015  . Nausea and vomiting 01/05/2015  . Ulcerative colitis ([place]) 01/05/2015  . Enteritis   . Nausea with vomiting   . Intractable nausea and vomiting 04/29/2014  . Benign paroxysmal positional vertigo 03/14/2013  . Lymphedema of arm 12/30/2011   Reviewed chart for medication changes ahead of medication coordination call.  OVs, hospital visits- 06/27/2020- [facility], 07/08/2020- Barb Merino, RN (CCM), 07/10/2020- [name], LSW (CCM), 07/22/2020- [name], LSW (CCM)  since last care coordination call/Pharmacist visit.   Medication changes indicated: 06/27/2020- Macrobid 100 mg started.  BP Readings from Last 3 Encounters:  06/27/20 120/77  05/31/20 (!) 111/59  02/26/20 (!) 132/94    No results found for: HGBA1C   Patient obtains medications through Vials  90 Days   Last adherence delivery included: Temazepam 7.5 mg - 1 capsule daily as needed for sleep Olmesartan 20 mg- 1 tablet by mouth daily Gabapentin 300 mg- 2 capsules by mouth three times daily Dicyclomine 20 mg- 1 tablet four times daily.  Patient declined the following medications last month:  Folic Acid 1 mg- 3 tablets once daily due to having a 30 DS left on medication from previous [facility] fill Tizanidine 2 mg- 1 tablet three times daily due to having and adequate supply until 07/27/20 Albuterol Aer Hfa- inhale two puffs every 6 hours as needed due to PRN use Celebrex 200 mg- 1 capsule twice daily with food due to having an adequate supply until 07/06/20 Loperamide 2 mg- 1 capsule by mouth four times daily as needed due to PRN use Methotrexate 50 mg/28m vial- inject 1 ml once weekly due to having an adequate supply until 07/21/20. Amitriptyline 25  mg- 1 tablet nightly declined due to [place]   Patient is due for next adherence delivery on: 07/25/2020. 07/17/2020- Patient called stating she does not want any medications delivered this month, unable to afford. Patient states she will call if medications are needed.   This delivery to include: None due to patient being unable to afford medications. [facility] aware to place profile on hold until needed.  Short fill or acute fill not needed.  Patient declined the following medications:  Temazepam 7.5 mg - 1 capsule daily as needed for sleep Olmesartan 20 mg- 1 tablet by mouth daily Gabapentin 300 mg- 2 capsules by mouth three times daily Dicyclomine 20 mg- 1 tablet four times daily. Folic Acid 1 mg- 3 tablets once daily  Tizanidine 2 mg- 1 tablet three times daily  Albuterol Aer Hfa- inhale two puffs every 6 hours as needed  Celebrex 200 mg- 1 capsule twice daily  Loperamide 2 mg- 1 capsule by mouth four times daily as needed  Methotrexate 50 mg/74m vial- inject 1 ml once weekly Amitriptyline 25 mg- 1 tablet nightly   Patient needs refills for none.  Follow-Up:  Coordination of Enhanced Pharmacy Services and Pharmacist Review  Patient does not want any refills or deliveries due to financial reasons, we have referred patient for CCM services to assist with financial difficulties.  VOrlando Penner CPP notified.  TPattricia Boss CWinnsboro MillsPharmacist Assistant [phone]

## 2020-07-27 NOTE — Progress Notes (Signed)
I have reviewed and agreed above plan. 

## 2020-07-29 NOTE — Chronic Care Management (AMB) (Signed)
Care Management   Follow Up Note   07/24/2020 Name: Gabrielle Hawkins MRN: [id number] DOB: [birth year]  Referred by: [name], FNP Reason for referral : Chronic Care Management (RNCM FU Call )   Gabrielle Hawkins is a 54 y.o. year old female who is a primary care patient of [name], [place]. The CCM team was consulted for assistance with chronic disease management and care coordination needs.    Review of patient status, including review of consultants reports, relevant laboratory and other test results, and collaboration with appropriate care team members and the patient's provider was performed as part of comprehensive patient evaluation and provision of chronic care management services.    SDOH (Social Determinants of Health) assessments performed: No See Care Plan activities for detailed interventions related to [place])   Placed outbound CCM RN CM call to patient for a care plan update.     Outpatient Encounter Medications as of 07/24/2020  Medication Sig Note  . albuterol (VENTOLIN HFA) 108 (90 Base) MCG/ACT inhaler Inhale 2 puffs into the lungs every 6 (six) hours as needed for wheezing or shortness of breath.   [name] amitriptyline (ELAVIL) 25 MG tablet Take 25 mg by mouth at bedtime.    . beclomethasone (QVAR) 80 MCG/ACT inhaler Inhale 1 puff into the lungs daily as needed (Shortness of Breath).    . busPIRone (BUSPAR) 5 MG tablet Take 1 tablet (5 mg total) by mouth 2 (two) times daily.   . celecoxib (CELEBREX) 200 MG capsule Take 200 mg by mouth 2 (two) times daily.    . cetirizine (ZYRTEC) 10 MG tablet Take 10 mg by mouth daily.   [name] dicyclomine (BENTYL) 20 MG tablet Take 20 mg by mouth 4 (four) times daily -  before meals and at bedtime. Up to four times daily as needed 07/09/2020: Patient is taking 1-2 times daily   . diphenhydrAMINE (BENADRYL) 25 mg capsule Take 25 mg by mouth daily as needed for itching.    . folic acid (FOLVITE) 1 MG tablet Take 3 mg by mouth daily.      [name] gabapentin (NEURONTIN) 300 MG capsule Take 2 capsules (600 mg total) by mouth 3 (three) times daily.   [name] HYDROcodone-acetaminophen (NORCO) 7.5-325 MG tablet Take 1 tablet by mouth every 6 (six) hours as needed for moderate pain. (Patient not taking: Reported on 07/09/2020)   . inFLIXimab (REMICADE) 100 MG injection Inject 100 mg into the vein every 30 (thirty) days. Infuse by intravenous route every 4 weeks  Patient is receiving Inflectra brand verses Remicade brand due to insurance (Patient not taking: Reported on 04/10/2020)   . inFLIXimab in sodium chloride 0.9 % Inject 10 mg/kg into the vein every 30 (thirty) days. Infuse every 4 weeks (Patient not taking: Reported on 07/09/2020)   . loperamide (IMODIUM) 2 MG capsule Take 1 capsule (2 mg total) by mouth 4 (four) times daily as needed for diarrhea or loose stools.   . meloxicam (MOBIC) 15 MG tablet Take 15 mg by mouth daily. (Patient not taking: Reported on 04/10/2020)   . methotrexate (50 MG/ML) 1 g injection Inject into the vein every Monday. 0.48m every Monday   . nitrofurantoin, macrocrystal-monohydrate, (MACROBID) 100 MG capsule Take 1 capsule (100 mg total) by mouth 2 (two) times daily.   .Marland Kitchenolmesartan (BENICAR) 20 MG tablet Take 1 tablet (20 mg total) by mouth daily.   . ondansetron (ZOFRAN) 4 MG tablet Take 4 mg by mouth every 6 (six) hours as needed  for nausea or vomiting.   . temazepam (RESTORIL) 7.5 MG capsule Take 1 capsule (7.5 mg total) by mouth at bedtime as needed for sleep. (Patient not taking: Reported on 07/09/2020)   . tiZANidine (ZANAFLEX) 2 MG tablet Take 2 mg by mouth 3 (three) times daily. 07/09/2020: Patient is taking once daily at bedtime    No facility-administered encounter medications on file as of 07/24/2020.     Objective:  No results found for: HGBA1C Lab Results  Component Value Date   MICROALBUR 10 06/04/2019   LDLCALC 124 (H) 12/24/2009   CREATININE 0.93 06/27/2020   BP Readings from Last 3 Encounters:   06/27/20 120/77  05/31/20 (!) 111/59  02/26/20 (!) 132/94    Goals Addressed      Patient Stated   .  "to be less anxious and have some hope" (pt-stated)        CARE PLAN ENTRY (see longitudinal plan of care for additional care plan information)  Current Barriers:  [name] Knowledge Deficits related to evaluation and treatment for anxiety and depression  . Chronic Disease Management support and education needs related to Essential hypertension, Ulcerative Colitis w/o complication, right-sided low back pain with right-sided sciatica  . Film/video editor.   Nurse Case Manager Clinical Goal(s):  [name] Over the next 180 days, patient will work with the CCM team and PCP to address needs related to disease education and support for improved Self Health management of depression and anxiety   CCM RN CM Interventions:  07/08/20 call completed with patient  . Inter-disciplinary care team collaboration (see longitudinal plan of care) . Evaluation of current treatment plan related to anxiety and depression  and patient's adherence to plan as established by provider. . Provided education to patient re: recommendations for treatment of anxiety/depression: Determined patient would like to start a prescribed medication as determined by PCP for symptom management; Determined patient is interested in talk therapy if available while uninsured . Reviewed medications with patient and discussed patient is not currently prescribed an anti-anxiety/anti-depressant for symptom management related to anxiety/depression  . Collaborated with [name] FNP via secure chat regarding recommendations for starting patient on an anti-anxiety/anti-depressant for symptom management: Collaborated with embedded BSW [name] regarding outreach to patient to discuss any available resources that may allow patient to receive counseling while uninsured  . Discussed plans with patient for ongoing care management follow up and  provided patient with direct contact information for care management team 07/09/20 Case Collaboration . Message received from PCP [name], FNP stating she is prescribing Buspirone for patient to take once daily x 1 week then increase to twice daily and return to the office in 4 weeks for evaluation of medication effectiveness  . Successful call placed to patient, informed patient of prescribed anti-anxiety Buspirone; Educated on indication, dosage and frequency as prescribed by PCP, patient verbalizes understanding . Discussed PCP would like to see her in 4 weeks for evaluation of her symptoms and mediation effectiveness  07/24/20 call completed with patient  . Determined patient has applied for a remote position and is waiting for a start date . Discussed she will have full benefits and will be able to follow through with receiving the health care needed . Discussed patient feels more hopeful about her situation and is coping more effectively since being offered a position that will allow her to work from home with a flexible position  . Discussed plans with patient for ongoing care management follow up and provided patient  with direct contact information for care management team  Patient Self Care Activities:  . Self administers medications as prescribed . Attends all scheduled provider appointments . Calls pharmacy for medication refills . Calls provider office for new concerns or questions . Lacks social connections  Please see past updates related to this goal by clicking on the "Past Updates" button in the selected goal         Plan:   Telephone follow up appointment with care management team member scheduled for: 09/15/20  Barb Merino, RN, BSN, CCM Care Management Coordinator [place] [facility]  Direct Phone: [phone]

## 2020-08-20 ENCOUNTER — Ambulatory Visit: Payer: Medicare Other

## 2020-08-20 ENCOUNTER — Telehealth: Payer: Self-pay

## 2020-08-20 DIAGNOSIS — I10 Essential (primary) hypertension: Secondary | ICD-10-CM

## 2020-08-20 DIAGNOSIS — K5180 Other ulcerative colitis without complications: Secondary | ICD-10-CM

## 2020-08-20 NOTE — Patient Instructions (Signed)
  Goals we discussed today:  Goals Addressed            This Visit's Progress   . Work with SW to manage care coordination needs       Timeframe:  Long-Range Goal Priority:  [employer] Start Date:   12.16.21                          Expected End Date:  4.15.22                     Next outreach planned for: 1.31.22  Patient Goals/Self-Care Activities Over the next 60 days, patient will:   - Patient will self administer medications as prescribed Patient will attend all scheduled provider appointments Patient will call provider office for new concerns or questions Participate in Disability Appeals hearing scheduled for 09/30/20 Contact SW as needed prior to next scheduled call

## 2020-08-20 NOTE — Progress Notes (Signed)
  Please void note , Open in Error.  [place] Pharmacist Assistant [phone]

## 2020-08-20 NOTE — Chronic Care Management (AMB) (Signed)
Chronic Care Management    Social Work Follow Up Note  08/20/2020 Name: Gabrielle Hawkins MRN: [id number] DOB: 1966-01-17  Gabrielle Hawkins is a 54 y.o. year old female who is a primary care patient of [name], [place]. The CCM team was consulted for assistance with care coordination.   Review of patient status, including review of consultants reports, other relevant assessments, and collaboration with appropriate care team members and the patient's provider was performed as part of comprehensive patient evaluation and provision of chronic care management services.    SDOH (Social Determinants of Health) assessments performed: No    Outpatient Encounter Medications as of 08/20/2020  Medication Sig Note  . albuterol (VENTOLIN HFA) 108 (90 Base) MCG/ACT inhaler Inhale 2 puffs into the lungs every 6 (six) hours as needed for wheezing or shortness of breath.   [name] amitriptyline (ELAVIL) 25 MG tablet Take 25 mg by mouth at bedtime.    . beclomethasone (QVAR) 80 MCG/ACT inhaler Inhale 1 puff into the lungs daily as needed (Shortness of Breath).    . busPIRone (BUSPAR) 5 MG tablet Take 1 tablet (5 mg total) by mouth 2 (two) times daily.   . celecoxib (CELEBREX) 200 MG capsule Take 200 mg by mouth 2 (two) times daily.    . cetirizine (ZYRTEC) 10 MG tablet Take 10 mg by mouth daily.   [name] dicyclomine (BENTYL) 20 MG tablet Take 20 mg by mouth 4 (four) times daily -  before meals and at bedtime. Up to four times daily as needed 07/09/2020: Patient is taking 1-2 times daily   . diphenhydrAMINE (BENADRYL) 25 mg capsule Take 25 mg by mouth daily as needed for itching.    . folic acid (FOLVITE) 1 MG tablet Take 3 mg by mouth daily.    [name] gabapentin (NEURONTIN) 300 MG capsule Take 2 capsules (600 mg total) by mouth 3 (three) times daily.   [name] HYDROcodone-acetaminophen (NORCO) 7.5-325 MG tablet Take 1 tablet by mouth every 6 (six) hours as needed for moderate pain. (Patient not taking: Reported on  07/09/2020)   . inFLIXimab (REMICADE) 100 MG injection Inject 100 mg into the vein every 30 (thirty) days. Infuse by intravenous route every 4 weeks  Patient is receiving Inflectra brand verses Remicade brand due to insurance (Patient not taking: Reported on 04/10/2020)   . inFLIXimab in sodium chloride 0.9 % Inject 10 mg/kg into the vein every 30 (thirty) days. Infuse every 4 weeks (Patient not taking: Reported on 07/09/2020)   . loperamide (IMODIUM) 2 MG capsule Take 1 capsule (2 mg total) by mouth 4 (four) times daily as needed for diarrhea or loose stools.   . meloxicam (MOBIC) 15 MG tablet Take 15 mg by mouth daily. (Patient not taking: Reported on 04/10/2020)   . methotrexate (50 MG/ML) 1 g injection Inject into the vein every Monday. 0.36m every Monday   . nitrofurantoin, macrocrystal-monohydrate, (MACROBID) 100 MG capsule Take 1 capsule (100 mg total) by mouth 2 (two) times daily.   .Marland Kitchenolmesartan (BENICAR) 20 MG tablet Take 1 tablet (20 mg total) by mouth daily.   . ondansetron (ZOFRAN) 4 MG tablet Take 4 mg by mouth every 6 (six) hours as needed for nausea or vomiting.   . temazepam (RESTORIL) 7.5 MG capsule Take 1 capsule (7.5 mg total) by mouth at bedtime as needed for sleep. (Patient not taking: Reported on 07/09/2020)   . tiZANidine (ZANAFLEX) 2 MG tablet Take 2 mg by mouth 3 (three) times daily. 07/09/2020: Patient  is taking once daily at bedtime    No facility-administered encounter medications on file as of 08/20/2020.     Patient Care Plan: Social Work Care Plan    Problem Identified: Care Coordination     Long-Range Goal: Collaborate with RN Care Manager to perform appropriate assessments to assist with care coordination needs   Start Date: 08/20/2020  Expected End Date: 12/18/2020  This Visit's Progress: On track  Priority: Medium  Note:   Current Barriers:  . Financial constraints related to recent loss of Disability benefit . Chronic conditions including HTN and Crohn's Disease  which put patient at increased risk of hospitalization  Social Work Clinical Goal(s):  [name] Over the next 120 days, patient will work with SW to address concerns related to care coordination  Interventions: . 1:1 collaboration with [name], [place] regarding development and update of comprehensive plan of care as evidenced by provider attestation and co-signature . Inter-disciplinary care team collaboration (see longitudinal plan of care) . Successful outbound call placed to the patient to assess care coordination needs . Determined the job the patient was pursuing fell through so she is still without income . Discussed patients disability hearing is scheduled for 09/30/20 to determine outcome of appeal . Scheduled follow up call for 10/05/20  Patient Goals/Self-Care Activities Over the next 60 days, patient will:   - Patient will self administer medications as prescribed Patient will attend all scheduled provider appointments Patient will call provider office for new concerns or questions Participate in [place] hearing scheduled for 09/30/20 Contact SW as needed prior to next scheduled call  Follow up Plan: SW will follow up with patient by phone over the next 45 days     [name], BSW, CDP Social Worker, Certified Dementia Practitioner [place] [facility] [place] [facility] [phone]

## 2020-08-30 ENCOUNTER — Telehealth: Payer: Self-pay | Admitting: Gastroenterology

## 2020-08-30 NOTE — Telephone Encounter (Signed)
Patient called with complaints of mucus per rectum with some blood and abdominal pain.  She said a lot of mucus, small amounts of blood and she stated 10/10 on the pain scale, but did not sound in acute distress.  Has history of UC.  She was on Remicade with good control until September but then lost her insurance.  Advised that if she is passing a lot of blood, pain is intolerable, develops fever, etc then she needs to proceed to the ED.  Otherwise she can call Dr. Ulyses Amor office in the morning to determine if this can be managed as an outpatient.  Can use Bentyl that she has at home, heating pad, tylenol, etc in the meantime.

## 2020-09-15 ENCOUNTER — Telehealth: Payer: Medicare Other

## 2020-09-15 ENCOUNTER — Telehealth: Payer: Self-pay

## 2020-09-15 NOTE — Chronic Care Management (AMB) (Signed)
Chronic Care Management Pharmacy Assistant   Name: Gabrielle Hawkins  MRN: [id number] DOB: [birth year]  Reason for Encounter: Medication Review  Patient Questions:  1.  Have you seen any other providers since your last visit? Yes, 12/16/2021Tillie Rung Hawkins, LSW (CCM)  2.  Any changes in your medicines or health? No    PCP : Gabrielle Brine, FNP  Allergies:   Allergies  Allergen Reactions   Humira [Adalimumab] Anaphylaxis   Ampicillin Hives and Itching   Codeine    Compazine [Prochlorperazine Maleate] Other (See Comments)    "Stroke-like symptoms" Can tolerate Phenergan.   Cymbalta [Duloxetine Hcl] Other (See Comments)    Fainting   Imitrex [Sumatriptan Base] Hives and Nausea And Vomiting   Lyrica [Pregabalin] Other (See Comments)    Fainting   Metoclopramide Hcl Hives   Percocet [Oxycodone-Acetaminophen] Itching   Effexor [Venlafaxine Hydrochloride] Hives and Palpitations   Tape Itching and Rash    Medications: Outpatient Encounter Medications as of 09/15/2020  Medication Sig Note   albuterol (VENTOLIN HFA) 108 (90 Base) MCG/ACT inhaler Inhale 2 puffs into the lungs every 6 (six) hours as needed for wheezing or shortness of breath.    amitriptyline (ELAVIL) 25 MG tablet Take 25 mg by mouth at bedtime.     beclomethasone (QVAR) 80 MCG/ACT inhaler Inhale 1 puff into the lungs daily as needed (Shortness of Breath).     busPIRone (BUSPAR) 5 MG tablet Take 1 tablet (5 mg total) by mouth 2 (two) times daily.    celecoxib (CELEBREX) 200 MG capsule Take 200 mg by mouth 2 (two) times daily.     cetirizine (ZYRTEC) 10 MG tablet Take 10 mg by mouth daily.    dicyclomine (BENTYL) 20 MG tablet Take 20 mg by mouth 4 (four) times daily -  before meals and at bedtime. Up to four times daily as needed 07/09/2020: Patient is taking 1-2 times daily    diphenhydrAMINE (BENADRYL) 25 mg capsule Take 25 mg by mouth daily as needed for itching.     folic acid (FOLVITE) 1 MG  tablet Take 3 mg by mouth daily.     gabapentin (NEURONTIN) 300 MG capsule Take 2 capsules (600 mg total) by mouth 3 (three) times daily.    HYDROcodone-acetaminophen (NORCO) 7.5-325 MG tablet Take 1 tablet by mouth every 6 (six) hours as needed for moderate pain. (Patient not taking: Reported on 07/09/2020)    inFLIXimab (REMICADE) 100 MG injection Inject 100 mg into the vein every 30 (thirty) days. Infuse by intravenous route every 4 weeks  Patient is receiving Inflectra brand verses Remicade brand due to insurance (Patient not taking: Reported on 04/10/2020)    inFLIXimab in sodium chloride 0.9 % Inject 10 mg/kg into the vein every 30 (thirty) days. Infuse every 4 weeks (Patient not taking: Reported on 07/09/2020)    loperamide (IMODIUM) 2 MG capsule Take 1 capsule (2 mg total) by mouth 4 (four) times daily as needed for diarrhea or loose stools.    meloxicam (MOBIC) 15 MG tablet Take 15 mg by mouth daily. (Patient not taking: Reported on 04/10/2020)    methotrexate (50 MG/ML) 1 g injection Inject into the vein every Monday. 0.22m every Monday    nitrofurantoin, macrocrystal-monohydrate, (MACROBID) 100 MG capsule Take 1 capsule (100 mg total) by mouth 2 (two) times daily.    olmesartan (BENICAR) 20 MG tablet Take 1 tablet (20 mg total) by mouth daily.    ondansetron (ZOFRAN) 4 MG tablet Take 4 mg  by mouth every 6 (six) hours as needed for nausea or vomiting.    temazepam (RESTORIL) 7.5 MG capsule Take 1 capsule (7.5 mg total) by mouth at bedtime as needed for sleep. (Patient not taking: Reported on 07/09/2020)    tiZANidine (ZANAFLEX) 2 MG tablet Take 2 mg by mouth 3 (three) times daily. 07/09/2020: Patient is taking once daily at bedtime    No facility-administered encounter medications on file as of 09/15/2020.    Current Diagnosis: Patient Active Problem List   Diagnosis Date Noted   Right-sided low back pain with right-sided sciatica 05/06/2019   Right sided numbness 03/27/2019    Paresthesia 12/25/2018   Essential hypertension 06/26/2018   Abdominal pain, chronic, right lower quadrant 01/06/2015   Nausea and vomiting 01/05/2015   Ulcerative colitis ([place]) 01/05/2015   Enteritis    Nausea with vomiting    Intractable nausea and vomiting 04/29/2014   Benign paroxysmal positional vertigo 03/14/2013   Lymphedema of arm 12/30/2011   Reviewed chart for medication changes ahead of medication coordination call.   OV-  08/20/2020- Kendra Hawkins, LSW (CCM) since last care coordination call/Pharmacist visit.   No medication changes indicated.  BP Readings from Last 3 Encounters:  06/27/20 120/77  05/31/20 (!) 111/59  02/26/20 (!) 132/94    No results found for: HGBA1C   Patient obtains medications through [facility]  90 Days   09/24/20- Before attempting to call patient again, checked in [facility] system and patient no longer has a profile with pharmacy. No more attempts made.  Follow-Up:  Coordination of Enhanced Pharmacy Services and Pharmacist Review  Patient no longer with [facility], pharmacy notified, to remove from adherence call sheets and we will follow up with patient through [facility] Pharmacist services. Gabrielle Hawkins, CPP notified.  Gabrielle Hawkins, [place] Pharmacist Assistant [phone]

## 2020-10-01 ENCOUNTER — Telehealth: Payer: Self-pay | Admitting: Nurse Practitioner

## 2020-10-01 NOTE — Telephone Encounter (Signed)
Left message for patient to call back and schedule Medicare Annual Wellness Visit (AWV).   Last AWV 06/04/2019  please schedule at anytime with [facility]    This should be a 45 minute visit.

## 2020-10-05 ENCOUNTER — Telehealth: Payer: Medicare Other

## 2020-10-05 ENCOUNTER — Telehealth: Payer: Self-pay

## 2020-10-05 NOTE — Telephone Encounter (Signed)
  Chronic Care Management   Outreach Note  10/05/2020 Name: Gabrielle Hawkins MRN: [id number] DOB: August 02, 1966  Referred by: [name], FNP Reason for referral : Care Coordination   An unsuccessful telephone outreach was attempted today. The patient was referred to the case management team for assistance with care management and care coordination.   Follow Up Plan: A HIPAA compliant phone message was left for the patient providing contact information and requesting a return call.  The care management team will reach out to the patient again over the next 21 days.   [name], BSW, CDP Social Worker, Certified Dementia Practitioner [place] [facility] [place] [facility] [phone]

## 2020-10-15 ENCOUNTER — Telehealth: Payer: Medicare Other

## 2020-10-21 ENCOUNTER — Telehealth: Payer: Medicare Other

## 2020-10-21 ENCOUNTER — Telehealth: Payer: Self-pay

## 2020-10-21 NOTE — Telephone Encounter (Signed)
  Chronic Care Management   Outreach Note  10/21/2020 Name: Gabrielle Hawkins MRN: [id number] DOB: 22-Jun-1966  Referred by: [name], FNP Reason for referral : Care Coordination   A second unsuccessful telephone outreach was attempted today. The patient was referred to the case management team for assistance with care management and care coordination.     Follow Up Plan: A HIPAA compliant phone message was left for the patient providing contact information and requesting a return call.  The care management team will reach out to the patient again over the next 30 days.   [name], BSW, CDP Social Worker, Certified Dementia Practitioner [place] [facility] [place] [facility] [phone]

## 2020-10-26 ENCOUNTER — Telehealth: Payer: Self-pay

## 2020-10-26 ENCOUNTER — Telehealth: Payer: Medicare Other

## 2020-10-26 NOTE — Telephone Encounter (Signed)
  Chronic Care Management   Outreach Note  10/26/2020 Name: Gabrielle Hawkins MRN: [id number] DOB: May 29, 1966  Referred by: [name], FNP Reason for referral : Care Coordination   SW placed a successful outbound call to the patient to assist with care coordination needs. Discussed the patient is unable to complete today's call and requests SW contact her at a later time.    Follow Up Plan: Appointment scheduled for March 2nd.  [name], BSW, CDP Social Worker, Certified Dementia Practitioner [place] [facility] [place] [facility] [phone]

## 2020-11-02 ENCOUNTER — Emergency Department (HOSPITAL_COMMUNITY): Payer: 59

## 2020-11-02 ENCOUNTER — Emergency Department (HOSPITAL_COMMUNITY)
Admission: EM | Admit: 2020-11-02 | Discharge: 2020-11-02 | Disposition: A | Discharge status: Home / Self Care | Payer: 59 | Attending: Emergency Medicine | Admitting: Emergency Medicine

## 2020-11-02 ENCOUNTER — Other Ambulatory Visit: Payer: Self-pay

## 2020-11-02 DIAGNOSIS — Z79899 Other long term (current) drug therapy: Secondary | ICD-10-CM | POA: Diagnosis not present

## 2020-11-02 DIAGNOSIS — I10 Essential (primary) hypertension: Secondary | ICD-10-CM | POA: Diagnosis not present

## 2020-11-02 DIAGNOSIS — Z041 Encounter for examination and observation following transport accident: Secondary | ICD-10-CM | POA: Insufficient documentation

## 2020-11-02 DIAGNOSIS — R079 Chest pain, unspecified: Secondary | ICD-10-CM | POA: Diagnosis present

## 2020-11-02 DIAGNOSIS — K5090 Crohn's disease, unspecified, without complications: Secondary | ICD-10-CM | POA: Diagnosis not present

## 2020-11-02 DIAGNOSIS — M545 Low back pain, unspecified: Secondary | ICD-10-CM | POA: Diagnosis not present

## 2020-11-02 MED ORDER — KETOROLAC TROMETHAMINE 60 MG/2ML IM SOLN
30.0000 mg | Freq: Once | INTRAMUSCULAR | Status: AC
  Administered 2020-11-02: 30 mg via INTRAMUSCULAR
  Filled 2020-11-02: qty 2

## 2020-11-02 MED ORDER — HYDROCODONE-ACETAMINOPHEN 5-325 MG PO TABS
1.0000 | ORAL_TABLET | Freq: Once | ORAL | Status: AC
  Administered 2020-11-02: 1 via ORAL
  Filled 2020-11-02: qty 1
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
## 2020-11-04 ENCOUNTER — Telehealth: Payer: Medicare Other

## 2020-11-04 ENCOUNTER — Emergency Department (HOSPITAL_COMMUNITY)
Admission: EM | Admit: 2020-11-04 | Discharge: 2020-11-04 | Disposition: A | Discharge status: Home / Self Care | Payer: 59 | Attending: Emergency Medicine | Admitting: Emergency Medicine

## 2020-11-04 ENCOUNTER — Encounter (HOSPITAL_COMMUNITY): Payer: Self-pay

## 2020-11-04 ENCOUNTER — Emergency Department (HOSPITAL_COMMUNITY): Payer: 59

## 2020-11-04 ENCOUNTER — Other Ambulatory Visit: Payer: Self-pay

## 2020-11-04 DIAGNOSIS — R0602 Shortness of breath: Secondary | ICD-10-CM | POA: Diagnosis not present

## 2020-11-04 DIAGNOSIS — Z853 Personal history of malignant neoplasm of breast: Secondary | ICD-10-CM | POA: Insufficient documentation

## 2020-11-04 DIAGNOSIS — J45909 Unspecified asthma, uncomplicated: Secondary | ICD-10-CM | POA: Insufficient documentation

## 2020-11-04 DIAGNOSIS — M25532 Pain in left wrist: Secondary | ICD-10-CM | POA: Diagnosis not present

## 2020-11-04 DIAGNOSIS — I10 Essential (primary) hypertension: Secondary | ICD-10-CM | POA: Diagnosis not present

## 2020-11-04 DIAGNOSIS — Y92410 Unspecified street and highway as the place of occurrence of the external cause: Secondary | ICD-10-CM | POA: Diagnosis not present

## 2020-11-04 DIAGNOSIS — E041 Nontoxic single thyroid nodule: Secondary | ICD-10-CM

## 2020-11-04 DIAGNOSIS — R0789 Other chest pain: Secondary | ICD-10-CM

## 2020-11-04 LAB — BASIC METABOLIC PANEL
Anion gap: 7 (ref 5–15)
BUN: 12 mg/dL (ref 6–20)
CO2: 25 mmol/L (ref 22–32)
Calcium: 8.9 mg/dL (ref 8.9–10.3)
Chloride: 105 mmol/L (ref 98–111)
Creatinine, Ser: 0.77 mg/dL (ref 0.44–1.00)
GFR, Estimated: >60 mL/min (ref >60)
Glucose, Bld: 93 mg/dL (ref 70–99)
Potassium: 3.9 mmol/L (ref 3.5–5.1)
Sodium: 137 mmol/L (ref 135–145)

## 2020-11-04 LAB — TROPONIN I (HIGH SENSITIVITY): Troponin I (High Sensitivity): 4 ng/L (ref <18)

## 2020-11-04 MED ORDER — TRAMADOL HCL 50 MG PO TABS: 50.0000 mg | ORAL_TABLET | Freq: Four times a day (QID) | ORAL | 0 refills | Status: DC | PRN

## 2020-11-04 MED ORDER — KETOROLAC TROMETHAMINE 30 MG/ML IJ SOLN
15.0000 mg | Freq: Once | INTRAMUSCULAR | Status: AC
  Administered 2020-11-04: 15 mg via INTRAVENOUS
  Filled 2020-11-04: qty 1

## 2020-11-04 MED ORDER — IOHEXOL 300 MG/ML  SOLN
75.0000 mL | Freq: Once | INTRAMUSCULAR | Status: AC | PRN
  Administered 2020-11-04: 75 mL via INTRAVENOUS
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
## 2020-11-04 NOTE — ED Notes (Signed)
Patient returned to room, ambulated unassisted to the stretcher.
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
## 2020-11-04 NOTE — ED Notes (Signed)
Patient transported to CT 

## 2020-11-04 NOTE — ED Notes (Signed)
Pt ambulated to the bathroom unassisted with even steady gait.

## 2020-11-10 ENCOUNTER — Encounter: Payer: Self-pay | Admitting: Nurse Practitioner

## 2020-11-10 ENCOUNTER — Ambulatory Visit: Payer: Medicare Other | Admitting: Nurse Practitioner

## 2020-11-11 ENCOUNTER — Ambulatory Visit: Payer: Medicare Other

## 2020-11-11 DIAGNOSIS — I10 Essential (primary) hypertension: Secondary | ICD-10-CM

## 2020-11-11 DIAGNOSIS — K5180 Other ulcerative colitis without complications: Secondary | ICD-10-CM

## 2020-11-12 NOTE — Patient Instructions (Signed)
   Goals we discussed today:  Goals Addressed            This Visit's Progress   . Work with SW to manage care coordination needs   On track    Timeframe:  Long-Range Goal Priority:  [employer] Start Date:   12.16.21                          Expected End Date:  4.15.22                     Next outreach planned for: 5.9.[name]22  Patient Goals/Self-Care Activities Over the next 60 days, patient will:   - Patient will self administer medications as prescribed Patient will attend all scheduled provider appointments Patient will call provider office for new concerns or questions Contact SW as needed prior to next scheduled call
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
## 2020-11-13 ENCOUNTER — Telehealth: Payer: Self-pay

## 2020-11-16 ENCOUNTER — Telehealth: Payer: Medicare Other

## 2020-11-16 NOTE — Chronic Care Management (AMB) (Signed)
Chronic Care Management Pharmacy Assistant   Name: Gabrielle Hawkins  MRN: [id number] DOB: [birth year]   Reason for Encounter: General Adherence Call    Recent office visits:  11/11/20-Humble, Tillie Rung (CCM).   Recent consult visits:  11/04/20-AP-CT Imaging. 11/02/20-[facility]-Diagnostic RAD.  Hospital visits:  Medication Reconciliation was completed by comparing discharge summary, patient's EMR and Pharmacy list, and upon discussion with patient.  Admitted to the hospital on 11/02/20 and 11/04/20 due to [employer]. Discharge date was 11/02/20 and 11/04/20. Discharged from [facility] Emergency Department.     No New?Medications Started at [facility] Discharge.  No Medication Changes at [facility] Discharge.  Medications Discontinued at Hospital Discharge: -Stopped Hydrocodone-Acetaminophen 7.5-325 MG, inFLIXimab 100 mg, inFLIXimab in sodium chloride 0.9 %, meloxicam 15 MG tablet, temazepam 7.5 MG capsule due to patient not taking.  Medications that remain the same after Hospital Discharge:??  -All other medications will remain the same.    Medications: Outpatient Encounter Medications as of 11/13/2020  Medication Sig Note  . albuterol (VENTOLIN HFA) 108 (90 Base) MCG/ACT inhaler Inhale 2 puffs into the lungs every 6 (six) hours as needed for wheezing or shortness of breath.   [name] amitriptyline (ELAVIL) 25 MG tablet Take 25 mg by mouth at bedtime.    . beclomethasone (QVAR) 80 MCG/ACT inhaler Inhale 1 puff into the lungs daily as needed (Shortness of Breath).    . busPIRone (BUSPAR) 5 MG tablet Take 1 tablet (5 mg total) by mouth 2 (two) times daily.   . celecoxib (CELEBREX) 200 MG capsule Take 200 mg by mouth 2 (two) times daily.    . cetirizine (ZYRTEC) 10 MG tablet Take 10 mg by mouth daily.   [name] dicyclomine (BENTYL) 20 MG tablet Take 20 mg by mouth 4 (four) times daily -  before meals and at bedtime. Up to four times daily as needed 07/09/2020: Patient is taking 1-2 times  daily   . diphenhydrAMINE (BENADRYL) 25 mg capsule Take 25 mg by mouth daily as needed for itching.    . folic acid (FOLVITE) 1 MG tablet Take 3 mg by mouth daily.    [name] gabapentin (NEURONTIN) 300 MG capsule Take 2 capsules (600 mg total) by mouth 3 (three) times daily.   [name] loperamide (IMODIUM) 2 MG capsule Take 1 capsule (2 mg total) by mouth 4 (four) times daily as needed for diarrhea or loose stools.   . methotrexate (50 MG/ML) 1 g injection Inject into the vein every Monday. 0.50m every Monday   . nitrofurantoin, macrocrystal-monohydrate, (MACROBID) 100 MG capsule Take 1 capsule (100 mg total) by mouth 2 (two) times daily.   .Marland Kitchenolmesartan (BENICAR) 20 MG tablet Take 1 tablet (20 mg total) by mouth daily.   . ondansetron (ZOFRAN) 4 MG tablet Take 4 mg by mouth every 6 (six) hours as needed for nausea or vomiting.   .[name] (ZANAFLEX) 2 MG tablet Take 2 mg by mouth 3 (three) times daily. 07/09/2020: Patient is taking once daily at bedtime   . traMADol (ULTRAM) 50 MG tablet Take 1 tablet (50 mg total) by mouth every 6 (six) hours as needed.    No facility-administered encounter medications on file as of 11/13/2020.   11/16/20-Successful telephone outreach to the patient for a general adherence call. Patient voiced she is doing fine with no concerns or questions at this time. The patient is scheduled to see Dr. JMinette Brine FNP on 11/17/20 at 2:45 PM. The patient confirmed appointment through telephone call. The  patient voiced she has some other concerns she will like to speak on with Dr. [name], FNP. I informed the patient to please feel free to call if needing any future assistance and care coordination. The patient voiced understanding and gave thanks.  Star Rating Drugs: Olmesartan 20 MG Tablet: #90 DS, last filled 09/25/20 at [facility].  Orlando Penner, CPP Notified.  Raynelle Highland, [place] Pharmacist Assistant [phone] CCM Total Time: 39 minutes

## 2020-11-17 ENCOUNTER — Other Ambulatory Visit: Payer: Self-pay

## 2020-11-17 ENCOUNTER — Encounter: Payer: Self-pay | Admitting: Nurse Practitioner

## 2020-11-17 ENCOUNTER — Telehealth: Payer: Self-pay

## 2020-11-17 ENCOUNTER — Ambulatory Visit (INDEPENDENT_AMBULATORY_CARE_PROVIDER_SITE_OTHER): Payer: BC Managed Care – PPO | Admitting: Nurse Practitioner

## 2020-11-17 VITALS — BP 122/84 | HR 99 | Temp 98.2°F | Ht 67.2 in | Wt 192.2 lb

## 2020-11-17 DIAGNOSIS — R4586 Emotional lability: Secondary | ICD-10-CM

## 2020-11-17 DIAGNOSIS — E041 Nontoxic single thyroid nodule: Secondary | ICD-10-CM

## 2020-11-17 DIAGNOSIS — G4700 Insomnia, unspecified: Secondary | ICD-10-CM

## 2020-11-17 DIAGNOSIS — R5383 Other fatigue: Secondary | ICD-10-CM | POA: Diagnosis not present

## 2020-11-17 DIAGNOSIS — I10 Essential (primary) hypertension: Secondary | ICD-10-CM

## 2020-11-17 DIAGNOSIS — Z1159 Encounter for screening for other viral diseases: Secondary | ICD-10-CM

## 2020-11-17 DIAGNOSIS — I2510 Atherosclerotic heart disease of native coronary artery without angina pectoris: Secondary | ICD-10-CM

## 2020-11-17 MED ORDER — ESZOPICLONE 2 MG PO TABS: 2.0000 mg | ORAL_TABLET | Freq: Every evening | ORAL | 1 refills | Status: DC | PRN
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
## 2020-11-17 NOTE — Telephone Encounter (Signed)
Pt stated she will call to schedule that 6wk f/u appt after she goes home to look at her schedule

## 2020-11-18 ENCOUNTER — Telehealth: Payer: Medicare Other

## 2020-11-18 LAB — BMP8+EGFR
BUN/Creatinine Ratio: 15 (ref 9–23)
BUN: 13 mg/dL (ref 6–24)
CO2: 24 mmol/L (ref 20–29)
Calcium: 9.1 mg/dL (ref 8.7–10.2)
Chloride: 103 mmol/L (ref 96–106)
Creatinine, Ser: 0.85 mg/dL (ref 0.57–1.00)
Glucose: 95 mg/dL (ref 65–99)
Potassium: 3.9 mmol/L (ref 3.5–5.2)
Sodium: 141 mmol/L (ref 134–144)
eGFR: 82 mL/min/{1.73_m2} (ref >59)

## 2020-11-18 LAB — T3, FREE: T3, Free: 2.6 pg/mL (ref 2.0–4.4)

## 2020-11-18 LAB — LIPID PANEL
Chol/HDL Ratio: 3.4 ratio (ref 0.0–4.4)
Cholesterol, Total: 204 mg/dL — ABNORMAL HIGH (ref 100–199)
HDL: 60 mg/dL (ref >39)
LDL Chol Calc (NIH): 130 mg/dL — ABNORMAL HIGH (ref 0–99)
Triglycerides: 80 mg/dL (ref 0–149)
VLDL Cholesterol Cal: 14 mg/dL (ref 5–40)

## 2020-11-18 LAB — TSH: TSH: 1.64 u[IU]/mL (ref 0.450–4.500)

## 2020-11-18 LAB — T4: T4, Total: 8.4 ug/dL (ref 4.5–12.0)

## 2020-11-18 LAB — HEPATITIS C ANTIBODY: Hep C Virus Ab: <0.1 s/co ratio (ref 0.0–0.9)

## 2020-11-19 ENCOUNTER — Telehealth: Payer: Medicare Other

## 2020-11-19 ENCOUNTER — Ambulatory Visit: Payer: Self-pay

## 2020-11-19 DIAGNOSIS — N390 Urinary tract infection, site not specified: Secondary | ICD-10-CM

## 2020-11-19 DIAGNOSIS — Z87442 Personal history of urinary calculi: Secondary | ICD-10-CM | POA: Diagnosis not present

## 2020-11-19 DIAGNOSIS — R310 Gross hematuria: Secondary | ICD-10-CM | POA: Diagnosis not present

## 2020-11-19 DIAGNOSIS — G8929 Other chronic pain: Secondary | ICD-10-CM

## 2020-11-19 DIAGNOSIS — K5180 Other ulcerative colitis without complications: Secondary | ICD-10-CM

## 2020-11-19 DIAGNOSIS — I10 Essential (primary) hypertension: Secondary | ICD-10-CM

## 2020-11-19 DIAGNOSIS — E041 Nontoxic single thyroid nodule: Secondary | ICD-10-CM

## 2020-11-19 DIAGNOSIS — R5383 Other fatigue: Secondary | ICD-10-CM

## 2020-11-23 ENCOUNTER — Other Ambulatory Visit: Payer: Self-pay | Admitting: Nurse Practitioner

## 2020-11-23 DIAGNOSIS — E559 Vitamin D deficiency, unspecified: Secondary | ICD-10-CM

## 2020-11-23 LAB — SPECIMEN STATUS REPORT

## 2020-11-23 LAB — VITAMIN D 25 HYDROXY (VIT D DEFICIENCY, FRACTURES): Vit D, 25-Hydroxy: 15 ng/mL — ABNORMAL LOW (ref 30.0–100.0)

## 2020-11-23 MED ORDER — VITAMIN D (ERGOCALCIFEROL) 1.25 MG (50000 UNIT) PO CAPS
50000.0000 [IU] | ORAL_CAPSULE | ORAL | 1 refills | Status: DC
Start: 2020-11-23 — End: 2022-05-30

## 2020-11-23 NOTE — Patient Instructions (Signed)
Goals Addressed    Other   .  Evaluate and Treat thyroid nodule   On track     Timeframe:  Long-Range Goal Priority:  High Start Date: 11/19/20                            Expected End Date:  05/21/21  Next Follow Up date: 02/19/21  Patient Self-Care Activities:  . Complete thyroid US as recommended and as scheduled for evaluation and treatment of thyroid nodule                       .  Recurrent UTI - minmized or prevented   On track     Timeframe:  Long-Range Goal Priority:  High Start Date: 11/19/20                            Expected End Date: 05/21/21     Next Follow Up date: 02/19/21  Patient Self Care Activities:  . Drink 64 oz of water daily . Call [facility] to schedule a new patient appointment for evaluation and treatment of recurrent UTI . Report early symptoms suggestive of UTI promptly in order to seek treatment early  . Avoid caffeinated drinks                       .  Track My Symptoms-Crohn's disease   On track     Timeframe:  Long-Range Goal Priority:  Medium Start Date:  11/19/20                           Expected End Date:  05/21/21                     Follow Up Date: 02/19/21   - begin a symptom diary - bring symptom diary to all appointments - track what makes symptoms worse and what makes them better  . Keep all scheduled MD appointments . Contact PCP for questions or concerns . Call the pharmacy to refill your medications at least 7 days before you run out of medication    Why is this important?    Keeping track of symptoms that you have helps you to understand your condition. You will also learn what works to manage it.   You and your doctor will then be able to come up with the best treatment plan for you.    Notes:     [name]  Vitamin D deficiency improved or resolved   On track     Timeframe:  Long-Range Goal Priority:  High Start Date:  11/19/20                          Expected End Date:  05/21/21  Next Follow Up Date: 02/19/21   Self Care  Activities:  . Keep all scheduled MD appointments . Contact PCP for questions or concerns . Call the pharmacy to refill your medications at least 7 days before you run out of medication  . Start Vitamin D 3 and take exactly as directed w/o missed doses . Get at least 15 minutes of natural sunlight when able . Eat Vitamin D rich foods
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
## 2020-11-26 ENCOUNTER — Other Ambulatory Visit: Payer: Self-pay | Admitting: Nurse Practitioner

## 2020-11-26 DIAGNOSIS — F419 Anxiety disorder, unspecified: Secondary | ICD-10-CM

## 2020-11-30 ENCOUNTER — Ambulatory Visit
Admission: RE | Admit: 2020-11-30 | Discharge: 2020-11-30 | Disposition: A | Discharge status: Home / Self Care | Payer: BC Managed Care – PPO | Source: Ambulatory Visit | Attending: Nurse Practitioner | Admitting: Nurse Practitioner

## 2020-11-30 DIAGNOSIS — R5383 Other fatigue: Secondary | ICD-10-CM

## 2020-11-30 DIAGNOSIS — E041 Nontoxic single thyroid nodule: Secondary | ICD-10-CM

## 2020-11-30 DIAGNOSIS — R4586 Emotional lability: Secondary | ICD-10-CM

## 2020-12-01 ENCOUNTER — Other Ambulatory Visit: Payer: Self-pay | Admitting: Nurse Practitioner

## 2020-12-01 DIAGNOSIS — E041 Nontoxic single thyroid nodule: Secondary | ICD-10-CM

## 2020-12-01 NOTE — Progress Notes (Signed)
Spoke with patient via phone and gave results of thyroid ultrasound, needs further work up. Will refer to ENT for possible biopsy of right thyroid nodule

## 2020-12-09 ENCOUNTER — Encounter: Payer: Self-pay | Admitting: Nurse Practitioner

## 2020-12-15 DIAGNOSIS — M459 Ankylosing spondylitis of unspecified sites in spine: Secondary | ICD-10-CM | POA: Diagnosis not present

## 2020-12-15 DIAGNOSIS — Z79899 Other long term (current) drug therapy: Secondary | ICD-10-CM | POA: Diagnosis not present

## 2020-12-15 DIAGNOSIS — M2550 Pain in unspecified joint: Secondary | ICD-10-CM | POA: Diagnosis not present

## 2020-12-15 DIAGNOSIS — K5100 Ulcerative (chronic) pancolitis without complications: Secondary | ICD-10-CM | POA: Diagnosis not present

## 2020-12-24 ENCOUNTER — Other Ambulatory Visit: Payer: Self-pay

## 2020-12-24 ENCOUNTER — Encounter (INDEPENDENT_AMBULATORY_CARE_PROVIDER_SITE_OTHER): Payer: Self-pay | Admitting: Otolaryngology

## 2020-12-24 ENCOUNTER — Ambulatory Visit (INDEPENDENT_AMBULATORY_CARE_PROVIDER_SITE_OTHER): Payer: BC Managed Care – PPO | Admitting: Otolaryngology

## 2020-12-24 VITALS — Temp 97.2°F

## 2020-12-24 DIAGNOSIS — E041 Nontoxic single thyroid nodule: Secondary | ICD-10-CM
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
## 2020-12-29 ENCOUNTER — Other Ambulatory Visit (INDEPENDENT_AMBULATORY_CARE_PROVIDER_SITE_OTHER): Payer: Self-pay

## 2020-12-29 DIAGNOSIS — D440 Neoplasm of uncertain behavior of thyroid gland: Secondary | ICD-10-CM

## 2020-12-31 DIAGNOSIS — R310 Gross hematuria: Secondary | ICD-10-CM | POA: Diagnosis not present

## 2021-01-05 ENCOUNTER — Other Ambulatory Visit (HOSPITAL_COMMUNITY)
Admission: RE | Admit: 2021-01-05 | Discharge: 2021-01-05 | Disposition: A | Discharge status: Home / Self Care | Payer: BC Managed Care – PPO | Source: Ambulatory Visit | Attending: Otolaryngology | Admitting: Otolaryngology

## 2021-01-05 ENCOUNTER — Ambulatory Visit
Admission: RE | Admit: 2021-01-05 | Discharge: 2021-01-05 | Disposition: A | Discharge status: Home / Self Care | Payer: BC Managed Care – PPO | Source: Ambulatory Visit | Attending: Otolaryngology | Admitting: Otolaryngology

## 2021-01-05 DIAGNOSIS — E041 Nontoxic single thyroid nodule: Secondary | ICD-10-CM | POA: Diagnosis not present

## 2021-01-05 DIAGNOSIS — D440 Neoplasm of uncertain behavior of thyroid gland: Secondary | ICD-10-CM | POA: Diagnosis not present

## 2021-01-05 DIAGNOSIS — D34 Benign neoplasm of thyroid gland: Secondary | ICD-10-CM | POA: Diagnosis not present

## 2021-01-06 LAB — CYTOLOGY - NON PAP

## 2021-01-11 ENCOUNTER — Telehealth: Payer: Medicare Other

## 2021-01-12 ENCOUNTER — Other Ambulatory Visit: Payer: Self-pay

## 2021-01-12 ENCOUNTER — Encounter (INDEPENDENT_AMBULATORY_CARE_PROVIDER_SITE_OTHER): Payer: Self-pay | Admitting: Otolaryngology

## 2021-01-12 ENCOUNTER — Ambulatory Visit (INDEPENDENT_AMBULATORY_CARE_PROVIDER_SITE_OTHER): Payer: BC Managed Care – PPO | Admitting: Otolaryngology

## 2021-01-12 VITALS — Temp 97.2°F

## 2021-01-12 DIAGNOSIS — R490 Dysphonia: Secondary | ICD-10-CM

## 2021-01-12 DIAGNOSIS — E041 Nontoxic single thyroid nodule: Secondary | ICD-10-CM | POA: Diagnosis not present

## 2021-01-12 NOTE — Progress Notes (Signed)
HPI: Gabrielle Hawkins is a 55 y.o. female who returns today for evaluation of fine-needle aspirate biopsy of right thyroid nodule.  This demonstrated a follicular nodule which appeared benign Bethesda category II.  I reviewed the biopsy report with the patient in the office today. She states that sometimes she gets hoarse especially in the mornings.  She was wondering if the nodule cause for this.  She has normal voice in the office today..  Past Medical History:  Diagnosis Date  . Anemia 1998   . Anxiety   . Arthritis    big toe   . Asthma   . BRCA1 negative   . BRCA2 negative   . Breast cancer ([place]) 2006   chem/radiation/2years tamoxifen  . Chronic neck and back pain   . Crohn's disease ([place])   . Depression   . Endometriosis   . Fibroid   . GERD (gastroesophageal reflux disease)   . History of chemotherapy 01/2005  . Hx of tamoxifen therapy   . Hx of transfusion of packed red blood cells   . IBS (irritable bowel syndrome)   . Kidney stones   . Lymphedema of arm   . Migraines    just at dx of breast CA  . Mucinous cystadenoma of ovary    left  . Neuropathy   . Paresthesias   . Peripheral neuropathy   . Radiation 04/2005  . Stroke [facility])    related to when taken compazine  . Ulcerative colitis ([place])    with chronic diarrhea  . Urinary tract infection    x 3  . Vertigo 2014  . Vitamin D deficiency    Past Surgical History:  Procedure Laterality Date  . ABDOMINAL HYSTERECTOMY  2007   TAH/BSO  . BREAST SURGERY  2006   LUMPECTOMY  . CHOLECYSTECTOMY    . CHOLECYSTECTOMY N/A 04/29/2014   Procedure: LAPAROSCOPIC CHOLECYSTECTOMY WITH INTRAOPERATIVE CHOLANGIOGRAM;  Surgeon: Edward Jolly, MD;  [facility];  Service: General;  Laterality: N/A;  . COLONOSCOPY  June 2013   Dr. Benson Norway: sessile serrated adenoma  . COLONOSCOPY WITH PROPOFOL N/A 03/13/2015   Procedure: COLONOSCOPY WITH PROPOFOL;  Surgeon: [name], MD;  [facility];  Service: Endoscopy;   Laterality: N/A;  . DILATION AND CURETTAGE OF UTERUS    . FLEXIBLE SIGMOIDOSCOPY N/A 07/18/2014   Dr. Benson Norway: mild left-sided colitis, biopsies with quiescent UC  . MYOMECTOMY    . TONSILLECTOMY     Social History   Socioeconomic History  . Marital status: Single    Spouse name: Not on file  . Number of children: Not on file  . Years of education: Not on file  . Highest education level: Not on file  Occupational History  . 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 Smoking status: Never Smoker  . Smokeless tobacco: Never Used  Vaping Use  . Vaping Use: Never used  Substance and Sexual Activity  . Alcohol use: No  . Drug use: No  . Sexual activity: Not Currently    Birth control/protection: None, Surgical    Comment: HYST  Other Topics Concern  . Not on file  Social History Narrative   Patient lives at home with her nephew.    Patient has 2 years of college education.    Patient has 0 children.    Patient has a boyfriend.          Social Determinants  of Health   Financial Resource Strain: Medium Risk  . Difficulty of Paying Living Expenses: Somewhat hard  Food Insecurity: No Food Insecurity  . Worried About Charity fundraiser in the Last Year: Never true  . Ran Out of Food in the Last Year: Never true  Transportation Needs: No Transportation Needs  . Lack of Transportation (Medical): No  . Lack of Transportation (Non-Medical): No  Physical Activity: Not on file  Stress: Not on file  Social Connections: Not on file   Family History  Problem Relation Age of Onset  . Breast cancer Mother 99  . Heart failure Father   . Heart disease Father   . Cancer Maternal Grandmother        COLON  . Heart failure Paternal Grandmother   . Heart disease Paternal Grandmother    Allergies  Allergen Reactions  . Humira [Adalimumab] Anaphylaxis  . Ampicillin Hives and Itching  . Codeine   . Compazine [Prochlorperazine  Maleate] Other (See Comments)    "Stroke-like symptoms" Can tolerate Phenergan.  [name] Cymbalta [Duloxetine Hcl] Other (See Comments)    Fainting  . Imitrex [Sumatriptan Base] Hives and Nausea And Vomiting  . Lyrica [Pregabalin] Other (See Comments)    Fainting  . Metoclopramide Hcl Hives  . Percocet [Oxycodone-Acetaminophen] Itching  . Effexor [Venlafaxine Hydrochloride] Hives and Palpitations  . Tape Itching and Rash   Prior to Admission medications   Medication Sig Start Date End Date Taking? Authorizing Provider  albuterol (VENTOLIN HFA) 108 (90 Base) MCG/ACT inhaler Inhale 2 puffs into the lungs every 6 (six) hours as needed for wheezing or shortness of breath. 04/13/20   [name], FNP  amitriptyline (ELAVIL) 25 MG tablet Take 25 mg by mouth at bedtime.  05/20/15   [provider]  beclomethasone (QVAR) 80 MCG/ACT inhaler Inhale 1 puff into the lungs daily as needed (Shortness of Breath).     [provider]  busPIRone (BUSPAR) 5 MG tablet TAKE ONE TABLET BY MOUTH TWICE DAILY 11/26/20   [name], FNP  celecoxib (CELEBREX) 200 MG capsule Take 200 mg by mouth 2 (two) times daily.     [provider]  cetirizine (ZYRTEC) 10 MG tablet Take 10 mg by mouth daily.    [provider]  dicyclomine (BENTYL) 20 MG tablet Take 20 mg by mouth 4 (four) times daily -  before meals and at bedtime. Up to four times daily as needed    [provider]  diphenhydrAMINE (BENADRYL) 25 mg capsule Take 25 mg by mouth daily as needed for itching.     [provider]  eszopiclone (LUNESTA) 2 MG TABS tablet Take 1 tablet (2 mg total) by mouth at bedtime as needed for sleep. Take immediately before bedtime 11/17/20 11/17/21  [name], FNP  folic acid (FOLVITE) 1 MG tablet Take 3 mg by mouth daily.     [provider]  gabapentin (NEURONTIN) 300 MG capsule Take 2 capsules (600 mg total) by mouth 3 (three) times daily. 04/21/20   Suzzanne Cloud, NP   loperamide (IMODIUM) 2 MG capsule Take 1 capsule (2 mg total) by mouth 4 (four) times daily as needed for diarrhea or loose stools. 04/13/20   [name], FNP  mesalamine (APRISO) 0.375 g 24 hr capsule Take 375 mg by mouth daily. 4 caps In the morning    [provider]  mesalamine (CANASA) 1000 MG suppository Place 1,000 mg rectally at bedtime. 4 g/60 mL  [provider]  methotrexate (50 MG/ML) 1 g injection Inject into the vein every Monday. 0.30m every Monday    [provider]  nitrofurantoin, macrocrystal-monohydrate, (MACROBID) 100 MG capsule Take 1 capsule (100 mg total) by mouth 2 (two) times daily. 06/27/20   Idol, JAlmyra Free PA-C  olmesartan (BENICAR) 20 MG tablet Take 1 tablet (20 mg total) by mouth daily. 06/22/20   MMinette Brine FNP  ondansetron (ZOFRAN) 4 MG tablet Take 4 mg by mouth every 6 (six) hours as needed for nausea or vomiting.    [provider]  tiZANidine (ZANAFLEX) 2 MG tablet Take 2 mg by mouth 3 (three) times daily.    [provider]  traMADol (ULTRAM) 50 MG tablet Take 1 tablet (50 mg total) by mouth every 6 (six) hours as needed. 11/04/20   IEvalee Jefferson PA-C  Vitamin D, Ergocalciferol, (DRISDOL) 1.25 MG (50000 UNIT) CAPS capsule Take 1 capsule (50,000 Units total) by mouth 2 (two) times a week. 11/23/20   MMinette Brine FNP  inFLIXimab (REMICADE) 100 MG injection Inject 100 mg into the vein every 30 (thirty) days. Infuse by intravenous route every 4 weeks  Patient is receiving Inflectra brand verses Remicade brand due to insurance Patient not taking: Reported on 04/10/2020  11/02/20  [provider]  temazepam (RESTORIL) 7.5 MG capsule Take 1 capsule (7.5 mg total) by mouth at bedtime as needed for sleep. Patient not taking: Reported on 07/09/2020 04/13/20 11/02/20  MMinette Brine FNP     Positive ROS: Otherwise negative  All other systems have been reviewed and were otherwise negative with the exception of those  mentioned in the HPI and as above.  Physical Exam: Constitutional: Alert, well-appearing, no acute distress Ears: External ears without lesions or tenderness. Ear canals are clear bilaterally with intact, clear TMs.  Nasal: External nose without lesions.. Clear nasal passages Oral: Lips and gums without lesions. Tongue and palate mucosa without lesions. Posterior oropharynx clear.  Indirect laryngoscopy revealed a clear base of tongue vallecula epiglottis.  Vocal cords a little bit difficult to visualize but had normal mobility. Neck: No palpable adenopathy or masses.  Patient with a easily palpable right thyroid nodule.  No palpable adenopathy otherwise. Respiratory: Breathing comfortably  Skin: No facial/neck lesions or rash noted.  Procedures  Assessment: Benign right thyroid nodule. Questionable GE reflux disease causing patient's a.m. hoarseness.  Plan: Placed her on omeprazole 40 mg daily before dinner for the next month. I discussed with her concerning follow-up here next time she has hoarseness for evaluation after trying the omeprazole for a month. Concerning the thyroid nodule would not recommend any further treatment unless this enlarges.   CRadene Journey MD

## 2021-01-14 ENCOUNTER — Emergency Department (HOSPITAL_COMMUNITY)
Admission: EM | Admit: 2021-01-14 | Discharge: 2021-01-14 | Disposition: A | Discharge status: Home / Self Care | Payer: BC Managed Care – PPO | Attending: Emergency Medicine | Admitting: Emergency Medicine

## 2021-01-14 ENCOUNTER — Other Ambulatory Visit: Payer: Self-pay

## 2021-01-14 ENCOUNTER — Encounter (HOSPITAL_COMMUNITY): Payer: Self-pay | Admitting: *Deleted

## 2021-01-14 DIAGNOSIS — R10817 Generalized abdominal tenderness: Secondary | ICD-10-CM | POA: Diagnosis not present

## 2021-01-14 DIAGNOSIS — J45909 Unspecified asthma, uncomplicated: Secondary | ICD-10-CM | POA: Insufficient documentation

## 2021-01-14 DIAGNOSIS — I10 Essential (primary) hypertension: Secondary | ICD-10-CM | POA: Insufficient documentation

## 2021-01-14 DIAGNOSIS — R112 Nausea with vomiting, unspecified: Secondary | ICD-10-CM | POA: Diagnosis not present

## 2021-01-14 DIAGNOSIS — R1030 Lower abdominal pain, unspecified: Secondary | ICD-10-CM | POA: Diagnosis not present

## 2021-01-14 DIAGNOSIS — R110 Nausea: Secondary | ICD-10-CM | POA: Insufficient documentation

## 2021-01-14 DIAGNOSIS — Z853 Personal history of malignant neoplasm of breast: Secondary | ICD-10-CM | POA: Insufficient documentation

## 2021-01-14 DIAGNOSIS — K51919 Ulcerative colitis, unspecified with unspecified complications: Secondary | ICD-10-CM

## 2021-01-14 DIAGNOSIS — Z79899 Other long term (current) drug therapy: Secondary | ICD-10-CM | POA: Insufficient documentation

## 2021-01-14 DIAGNOSIS — R197 Diarrhea, unspecified: Secondary | ICD-10-CM | POA: Diagnosis not present

## 2021-01-14 LAB — COMPREHENSIVE METABOLIC PANEL
ALT: 18 U/L (ref 0–44)
AST: 18 U/L (ref 15–41)
Albumin: 3.8 g/dL (ref 3.5–5.0)
Alkaline Phosphatase: 88 U/L (ref 38–126)
Anion gap: 6 (ref 5–15)
BUN: 20 mg/dL (ref 6–20)
CO2: 29 mmol/L (ref 22–32)
Calcium: 9.2 mg/dL (ref 8.9–10.3)
Chloride: 103 mmol/L (ref 98–111)
Creatinine, Ser: 0.77 mg/dL (ref 0.44–1.00)
GFR, Estimated: >60 mL/min (ref >60)
Glucose, Bld: 82 mg/dL (ref 70–99)
Potassium: 3.6 mmol/L (ref 3.5–5.1)
Sodium: 138 mmol/L (ref 135–145)
Total Bilirubin: 0.6 mg/dL (ref 0.3–1.2)
Total Protein: 8 g/dL (ref 6.5–8.1)

## 2021-01-14 LAB — CBC WITH DIFFERENTIAL/PLATELET
Abs Immature Granulocytes: 0.02 10*3/uL (ref 0.00–0.07)
Basophils Absolute: 0 10*3/uL (ref 0.0–0.1)
Basophils Relative: 0 %
Eosinophils Absolute: 0.1 10*3/uL (ref 0.0–0.5)
Eosinophils Relative: 2 %
HCT: 40.7 % (ref 36.0–46.0)
Hemoglobin: 12.7 g/dL (ref 12.0–15.0)
Immature Granulocytes: 0 %
Lymphocytes Relative: 30 %
Lymphs Abs: 2 10*3/uL (ref 0.7–4.0)
MCH: 29.2 pg (ref 26.0–34.0)
MCHC: 31.2 g/dL (ref 30.0–36.0)
MCV: 93.6 fL (ref 80.0–100.0)
Monocytes Absolute: 0.7 10*3/uL (ref 0.1–1.0)
Monocytes Relative: 10 %
Neutro Abs: 3.8 10*3/uL (ref 1.7–7.7)
Neutrophils Relative %: 58 %
Platelets: 209 10*3/uL (ref 150–400)
RBC: 4.35 MIL/uL (ref 3.87–5.11)
RDW: 13.5 % (ref 11.5–15.5)
WBC: 6.6 10*3/uL (ref 4.0–10.5)
nRBC: 0 % (ref 0.0–0.2)

## 2021-01-14 LAB — LIPASE, BLOOD: Lipase: 27 U/L (ref 11–51)

## 2021-01-14 MED ORDER — HYDROMORPHONE HCL 1 MG/ML IJ SOLN
0.5000 mg | Freq: Once | INTRAMUSCULAR | Status: AC
Start: 2021-01-14 — End: 2021-01-14
  Administered 2021-01-14: 0.5 mg via INTRAVENOUS
  Filled 2021-01-14: qty 1

## 2021-01-14 MED ORDER — ONDANSETRON 4 MG PO TBDP
4.0000 mg | ORAL_TABLET | Freq: Once | ORAL | Status: AC
  Administered 2021-01-14: 4 mg via ORAL
  Filled 2021-01-14: qty 1

## 2021-01-14 MED ORDER — PREDNISONE 10 MG PO TABS: 40.0000 mg | ORAL_TABLET | Freq: Every day | ORAL | 0 refills | Status: DC

## 2021-01-14 MED ORDER — SODIUM CHLORIDE 0.9 % IV BOLUS
1000.0000 mL | Freq: Once | INTRAVENOUS | Status: AC
  Administered 2021-01-14: 1000 mL via INTRAVENOUS

## 2021-01-14 MED ORDER — ONDANSETRON HCL 4 MG/2ML IJ SOLN
4.0000 mg | Freq: Once | INTRAMUSCULAR | Status: AC
  Administered 2021-01-14: 4 mg via INTRAVENOUS
  Filled 2021-01-14: qty 2

## 2021-01-14 NOTE — ED Triage Notes (Signed)
States she has ulcerative colitis and is having a flare up for the last 4 days, has nausea and vomiting

## 2021-01-14 NOTE — ED Provider Notes (Addendum)
Gabrielle Hawkins   CSN: [id number] Arrival date & time: 01/14/21  1115     History Chief Complaint  Patient presents with  . Abdominal Pain    Gabrielle Hawkins is a 55 y.o. female.  55 year old female with history of ulcerative colitis, presents with abdominal cramping, nausea, mucous and blood clots with her diarrhea similar to prior flares. Unable to eat due to nausea, no vomiting, no fevers. Take lunesta, bentyl, gabapentin, methotrexate, celebrex and mesalamine without improvement. Stopped her prednisone due to swelling.  Prior flares on Remicade infusions which controled her flares. Insurance has not authorized treatment lately.        Past Medical History:  Diagnosis Date  . Anemia 1998   . Anxiety   . Arthritis    big toe   . Asthma   . BRCA1 negative   . BRCA2 negative   . Breast cancer ([place]) 2006   chem/radiation/2years tamoxifen  . Chronic neck and back pain   . Crohn's disease ([place])   . Depression   . Endometriosis   . Fibroid   . GERD (gastroesophageal reflux disease)   . History of chemotherapy 01/2005  . Hx of tamoxifen therapy   . Hx of transfusion of packed red blood cells   . IBS (irritable bowel syndrome)   . Kidney stones   . Lymphedema of arm   . Migraines    just at dx of breast CA  . Mucinous cystadenoma of ovary    left  . Neuropathy   . Paresthesias   . Peripheral neuropathy   . Radiation 04/2005  . Stroke [facility])    related to when taken compazine  . Ulcerative colitis ([place])    with chronic diarrhea  . Urinary tract infection    x 3  . Vertigo 2014  . Vitamin D deficiency     Patient Active Problem List   Diagnosis Date Noted  . Right-sided low back pain with right-sided sciatica 05/06/2019  . Right sided numbness 03/27/2019  . Paresthesia 12/25/2018  . Essential hypertension 06/26/2018  . Abdominal pain, chronic, right lower quadrant 01/06/2015  . Nausea and vomiting 01/05/2015  .  Ulcerative colitis ([place]) 01/05/2015  . Enteritis   . Nausea with vomiting   . Intractable nausea and vomiting 04/29/2014  . Benign paroxysmal positional vertigo 03/14/2013  . Lymphedema of arm 12/30/2011    Past Surgical History:  Procedure Laterality Date  . ABDOMINAL HYSTERECTOMY  2007   TAH/BSO  . BREAST SURGERY  2006   LUMPECTOMY  . CHOLECYSTECTOMY    . CHOLECYSTECTOMY N/A 04/29/2014   Procedure: LAPAROSCOPIC CHOLECYSTECTOMY WITH INTRAOPERATIVE CHOLANGIOGRAM;  Surgeon: Edward Jolly, MD;  Location: [facility]S;  Service: General;  Laterality: N/A;  . COLONOSCOPY  June 2013   Dr. Benson [place]: sessile serrated adenoma  . COLONOSCOPY WITH PROPOFOL N/A 03/13/2015   Procedure: COLONOSCOPY WITH PROPOFOL;  Surgeon: [name], MD;  Location: [facility] ENDOSCOPY;  Service: Endoscopy;  Laterality: N/A;  . DILATION AND CURETTAGE OF UTERUS    . FLEXIBLE SIGMOIDOSCOPY N/A 07/18/2014   Dr. Benson [place]: mild left-sided colitis, biopsies with quiescent UC  . MYOMECTOMY    . TONSILLECTOMY       OB History    Gravida  1   Para  0   Term      Preterm      AB  1   Living  0     SAB  1   IAB  Ectopic      Multiple      Live Births              Family History  Problem Relation Age of Onset  . Breast cancer Mother 29  . Heart failure Father   . Heart disease Father   . Cancer Maternal Grandmother        COLON  . Heart failure Paternal Grandmother   . Heart disease Paternal Grandmother     Social History   Tobacco Use  . Smoking status: Never Smoker  . Smokeless tobacco: Never Used  Vaping Use  . Vaping Use: Never used  Substance Use Topics  . Alcohol use: No  . Drug use: No    Home Medications Prior to Admission medications   Medication Sig Start Date End Date Taking? Authorizing Provider  albuterol (VENTOLIN HFA) 108 (90 Base) MCG/ACT inhaler Inhale 2 puffs into the lungs every 6 (six) hours as needed for wheezing or shortness of breath. 04/13/20   [name], FNP   amitriptyline (ELAVIL) 25 MG tablet Take 25 mg by mouth at bedtime.  05/20/15   [provider]  beclomethasone (QVAR) 80 MCG/ACT inhaler Inhale 1 puff into the lungs daily as needed (Shortness of Breath).     [provider]  busPIRone (BUSPAR) 5 MG tablet TAKE ONE TABLET BY MOUTH TWICE DAILY 11/26/20   [name], FNP  celecoxib (CELEBREX) 200 MG capsule Take 200 mg by mouth 2 (two) times daily.     [provider]  cetirizine (ZYRTEC) 10 MG tablet Take 10 mg by mouth daily.    [provider]  dicyclomine (BENTYL) 20 MG tablet Take 20 mg by mouth 4 (four) times daily -  before meals and at bedtime. Up to four times daily as needed    [provider]  diphenhydrAMINE (BENADRYL) 25 mg capsule Take 25 mg by mouth daily as needed for itching.     [provider]  eszopiclone (LUNESTA) 2 MG TABS tablet Take 1 tablet (2 mg total) by mouth at bedtime as needed for sleep. Take immediately before bedtime 11/17/20 11/17/21  [name], FNP  folic acid (FOLVITE) 1 MG tablet Take 3 mg by mouth daily.     [provider]  gabapentin (NEURONTIN) 300 MG capsule Take 2 capsules (600 mg total) by mouth 3 (three) times daily. 04/21/20   Suzzanne Cloud, NP  loperamide (IMODIUM) 2 MG capsule Take 1 capsule (2 mg total) by mouth 4 (four) times daily as needed for diarrhea or loose stools. Patient not taking: Reported on 01/14/2021 04/13/20   [name], FNP  mesalamine (APRISO) 0.375 g 24 hr capsule Take 375 mg by mouth daily. 4 caps In the morning    [provider]  mesalamine (CANASA) 1000 MG suppository Place 1,000 mg rectally at bedtime. 4 g/60 mL    [provider]  methotrexate (50 MG/ML) 1 g injection Inject into the vein every Monday. 0.11m every Monday    [provider]  nitrofurantoin, macrocrystal-monohydrate, (MACROBID) 100 MG capsule Take 1 capsule (100 mg total) by mouth 2 (two) times daily. Patient not taking:  Reported on 01/14/2021 06/27/20   IEvalee Jefferson PA-C  olmesartan (BENICAR) 20 MG tablet Take 1 tablet (20 mg total) by mouth daily. 06/22/20   MMinette Brine FNP  ondansetron (ZOFRAN) 4 MG tablet Take 4 mg by mouth every 6 (six) hours as needed for nausea or vomiting.    [provider]  predniSONE (DELTASONE) 10 MG tablet Take 4 tablets (40 mg total) by mouth daily. 01/14/21   Tacy Learn, PA-C  tiZANidine (ZANAFLEX) 2 MG tablet Take 2 mg by mouth 3 (three) times daily.    [provider]  traMADol (ULTRAM) 50 MG tablet Take 1 tablet (50 mg total) by mouth every 6 (six) hours as needed. 11/04/20   Evalee Jefferson, PA-C  Vitamin D, Ergocalciferol, (DRISDOL) 1.25 MG (50000 UNIT) CAPS capsule Take 1 capsule (50,000 Units total) by mouth 2 (two) times a week. 11/23/20   [name], FNP  inFLIXimab (REMICADE) 100 MG injection Inject 100 mg into the vein every 30 (thirty) days. Infuse by intravenous route every 4 weeks  Patient is receiving Inflectra brand verses Remicade brand due to insurance Patient not taking: Reported on 04/10/2020  11/02/20  [provider]  temazepam (RESTORIL) 7.5 MG capsule Take 1 capsule (7.5 mg total) by mouth at bedtime as needed for sleep. Patient not taking: Reported on 07/09/2020 04/13/20 11/02/20  [name], FNP    Allergies    Humira [adalimumab], Ampicillin, Codeine, Compazine [prochlorperazine maleate], Cymbalta [duloxetine hcl], Imitrex [sumatriptan base], Lyrica [pregabalin], Metoclopramide hcl, Percocet [oxycodone-acetaminophen], Effexor [venlafaxine hydrochloride], and Tape  Review of Systems   Review of Systems  Constitutional: Negative for fever.  Respiratory: Negative for shortness of breath.   Cardiovascular: Negative for chest pain.  Gastrointestinal: Positive for abdominal pain, blood in stool, diarrhea and nausea. Negative for vomiting.  Genitourinary: Negative for difficulty urinating and dysuria.  Musculoskeletal: Negative for  arthralgias and myalgias.  Skin: Negative for rash and wound.  Allergic/Immunologic: Positive for immunocompromised state.  Neurological: Negative for weakness.  Psychiatric/Behavioral: Negative for confusion.  All other systems reviewed and are negative.   Physical Exam Updated Vital Signs BP 119/74   Pulse 70   Temp 98.2 F (36.8 C) (Oral)   Resp 18   Ht 5' 8"  (1.727 m)   Wt 81.6 kg   SpO2 98%   BMI 27.37 kg/m   Physical Exam Vitals and nursing Hawkins reviewed.  Constitutional:      General: She is not in acute distress.    Appearance: She is well-developed. She is not diaphoretic.  HENT:     Head: Normocephalic and atraumatic.  Cardiovascular:     Rate and Rhythm: Normal rate and regular rhythm.     Heart sounds: Normal heart sounds.  Pulmonary:     Effort: Pulmonary effort is normal.     Breath sounds: Normal breath sounds.  Abdominal:     Palpations: Abdomen is soft.     Tenderness: There is generalized abdominal tenderness.  Skin:    General: Skin is warm and dry.  Neurological:     Mental Status: She is alert and oriented to person, place, and time.  Psychiatric:        Behavior: Behavior normal.     ED Results / Procedures / Treatments   Labs (all labs ordered are listed, but only abnormal results are displayed) Labs Reviewed  CBC WITH DIFFERENTIAL/PLATELET  COMPREHENSIVE METABOLIC PANEL  LIPASE, BLOOD    EKG None  Radiology No results found.  Procedures Procedures   Medications Ordered in ED Medications  sodium chloride 0.9 % bolus 1,000 mL (0 mLs Intravenous Stopped 01/14/21 1545)  ondansetron (ZOFRAN) injection 4 mg (4 mg Intravenous Given 01/14/21 1326)  HYDROmorphone (DILAUDID) injection 0.5 mg (0.5 mg Intravenous Given 01/14/21 1326)  ondansetron (ZOFRAN-ODT) disintegrating tablet 4 mg (4 mg Oral Given  01/14/21 1735)    ED Course  I have reviewed the triage vital signs and the nursing notes.  Pertinent labs & imaging results that were  available during my care of the patient were reviewed by me and considered in my medical decision making (see chart for details).  Clinical Course as of 01/14/21 1742  Thu Jan 14, 2845  2972 55 year old female with history of ulcerative colitis presents with complaint of crampy abdominal pain with nausea as well as mucus and blood clots in her stools.  Similar symptoms to prior ulcerative colitis flares, no fevers, unable to get her Remicade infusions since September due to insurance problems. Found to have generalized abdominal tenderness, labs reassuring including normal CBC, CMP, lipase. Case discussed with Dr. Benson Norway with GI who recommends 40 mg of prednisone daily, request 10 mg tablets, dispense #120, patient to call Dr. Benson Norway on Monday to taper prednisone accordingly.  Also requests C. difficile prior to discharge.  Discussed results and plan of care with patient who verbalizes understanding. [LM]  1742 Patient was held for an additional 2-1/2 hours, passed a small amount of mucus without stool however voided in collection basin this is unable to be sent for C. difficile.  Patient has eaten a small snack without further stool output.  C. difficile not collected in ED today. [LM]    Clinical Course User Index [LM] Roque Lias   MDM Rules/Calculators/A&P                          Final Clinical Impression(s) / ED Diagnoses Final diagnoses:  Ulcerative colitis with complication, unspecified location [facility])    Rx / DC Orders ED Discharge Orders         Ordered    predniSONE (DELTASONE) 10 MG tablet  Daily,   Status:  Discontinued        01/14/21 1459    Clostridium Difficile by PCR([facility])  Status:  Canceled        01/14/21 1459    predniSONE (DELTASONE) 10 MG tablet  Daily        01/14/21 1501           Tacy Learn, PA-C 01/14/21 1531    Tacy Learn, PA-C 01/14/21 1742    Milton Ferguson, MD 01/15/21 1636

## 2021-01-14 NOTE — Discharge Instructions (Addendum)
Prescription for prednisone 34m tablets sent to your pharmacy. Take 4 tablets daily (413m. Call Dr. HuBenson Norwayn Monday to discuss further care plan.  C diff testing sent out today. This should be available in your MyChart when completed.  Return to the emergency room for worsening or concerning symptoms.

## 2021-01-18 DIAGNOSIS — K625 Hemorrhage of anus and rectum: Secondary | ICD-10-CM | POA: Diagnosis not present

## 2021-01-18 DIAGNOSIS — K5190 Ulcerative colitis, unspecified, without complications: Secondary | ICD-10-CM | POA: Diagnosis not present

## 2021-01-18 DIAGNOSIS — R197 Diarrhea, unspecified: Secondary | ICD-10-CM | POA: Diagnosis not present

## 2021-01-19 ENCOUNTER — Telehealth: Payer: Medicare Other

## 2021-01-19 ENCOUNTER — Telehealth: Payer: Self-pay

## 2021-01-19 NOTE — Telephone Encounter (Signed)
  Care Management   Follow Up Note   01/19/2021 Name: Gabrielle Hawkins MRN: [id number] DOB: 1966-02-02   Referred by: [name], FNP Reason for referral : Chronic Care Management   An unsuccessful telephone outreach was attempted today. The patient was referred to the case management team for assistance with care management and care coordination.   Follow Up Plan: A HIPPA compliant phone message was left for the patient providing contact information and requesting a return call. The care management team will attempt to outreach the patient over the next 21 days.  [name], BSW, CDP Social Worker, Certified Dementia Practitioner [place] [facility] [place] [facility] [phone]

## 2021-01-21 ENCOUNTER — Telehealth: Payer: Self-pay

## 2021-01-21 NOTE — Chronic Care Management (AMB) (Signed)
Chronic Care Management Pharmacy Assistant   Name: LIHANNA BIEVER  MRN: [id number] DOB: 10-18-1965   Reason for Encounter: Disease State/ Hypertension     Recent office visits:  11-17-2020 [name], [place]. START Lunesta 2 mg one tablet at mouth at bedtime as needed.  11-19-2020 Little, [name], RN (CCM)    Recent consult visits:  12-14-2020 [name] MD (Gastroenterology)  12-24-2020 [name], MD (Otolaryngology)  01-05-2021 [name], MD (Otolaryngology)  01-12-2021 [name], MD (Otolaryngology)  01-18-2021 [name] MD (Gastroenterology)   Hospital visits:  Medication Reconciliation was completed by comparing discharge summary, patient's EMR and Pharmacy list, and upon discussion with patient.  Admitted to the hospital on 01-14-2021 due to Ulcerative colitis with complication. Discharge date was 01-14-2021. Discharged from [place]?Medications Started at [facility] Discharge:?? -started Prednisone 10 mg due to Ulcerative colitis   Medication Changes at Hospital Discharge: None  Medications Discontinued at Hospital Discharge: None  Medications that remain the same after Hospital Discharge:??  -All other medications will remain the same.    Medications: Outpatient Encounter Medications as of 01/21/2021  Medication Sig Note  . albuterol (VENTOLIN HFA) 108 (90 Base) MCG/ACT inhaler Inhale 2 puffs into the lungs every 6 (six) hours as needed for wheezing or shortness of breath.   [name] amitriptyline (ELAVIL) 25 MG tablet Take 25 mg by mouth at bedtime.    . beclomethasone (QVAR) 80 MCG/ACT inhaler Inhale 1 puff into the lungs daily as needed (Shortness of Breath).    . busPIRone (BUSPAR) 5 MG tablet TAKE ONE TABLET BY MOUTH TWICE DAILY   . celecoxib (CELEBREX) 200 MG capsule Take 200 mg by mouth 2 (two) times daily.    . cetirizine (ZYRTEC) 10 MG tablet Take 10 mg by mouth daily.   [name] dicyclomine  (BENTYL) 20 MG tablet Take 20 mg by mouth 4 (four) times daily -  before meals and at bedtime. Up to four times daily as needed 07/09/2020: Patient is taking 1-2 times daily   . diphenhydrAMINE (BENADRYL) 25 mg capsule Take 25 mg by mouth daily as needed for itching.    . eszopiclone (LUNESTA) 2 MG TABS tablet Take 1 tablet (2 mg total) by mouth at bedtime as needed for sleep. Take immediately before bedtime   . folic acid (FOLVITE) 1 MG tablet Take 3 mg by mouth daily.    [name] gabapentin (NEURONTIN) 300 MG capsule Take 2 capsules (600 mg total) by mouth 3 (three) times daily.   [name] loperamide (IMODIUM) 2 MG capsule Take 1 capsule (2 mg total) by mouth 4 (four) times daily as needed for diarrhea or loose stools. (Patient not taking: Reported on 01/14/2021)   . mesalamine (APRISO) 0.375 g 24 hr capsule Take 375 mg by mouth daily. 4 caps In the morning   . mesalamine (CANASA) 1000 MG suppository Place 1,000 mg rectally at bedtime. 4 g/60 mL   . methotrexate (50 MG/ML) 1 g injection Inject into the vein every Monday. 0.25m every Monday   . nitrofurantoin, macrocrystal-monohydrate, (MACROBID) 100 MG capsule Take 1 capsule (100 mg total) by mouth 2 (two) times daily. (Patient not taking: Reported on 01/14/2021)   . olmesartan (BENICAR) 20 MG tablet Take 1 tablet (20 mg total) by mouth daily.   . ondansetron (ZOFRAN) 4 MG tablet Take 4 mg by mouth every 6 (six) hours as needed for nausea or vomiting.   . predniSONE (DELTASONE) 10 MG tablet  Take 4 tablets (40 mg total) by mouth daily.   [name] tiZANidine (ZANAFLEX) 2 MG tablet Take 2 mg by mouth 3 (three) times daily. 07/09/2020: Patient is taking once daily at bedtime   . traMADol (ULTRAM) 50 MG tablet Take 1 tablet (50 mg total) by mouth every 6 (six) hours as needed.   . Vitamin D, Ergocalciferol, (DRISDOL) 1.25 MG (50000 UNIT) CAPS capsule Take 1 capsule (50,000 Units total) by mouth 2 (two) times a week.   . [DISCONTINUED] inFLIXimab (REMICADE) 100 MG injection  Inject 100 mg into the vein every 30 (thirty) days. Infuse by intravenous route every 4 weeks  Patient is receiving Inflectra brand verses Remicade brand due to insurance (Patient not taking: Reported on 04/10/2020)   . [DISCONTINUED] temazepam (RESTORIL) 7.5 MG capsule Take 1 capsule (7.5 mg total) by mouth at bedtime as needed for sleep. (Patient not taking: Reported on 07/09/2020)    No facility-administered encounter medications on file as of 01/21/2021.   Reviewed chart prior to disease state call. Spoke with patient regarding BP  Recent Office Vitals: BP Readings from Last 3 Encounters:  01/14/21 119/74  11/17/20 122/84  11/04/20 122/73   Pulse Readings from Last 3 Encounters:  01/14/21 70  11/17/20 99  11/04/20 80    Wt Readings from Last 3 Encounters:  01/14/21 180 lb (81.6 kg)  11/17/20 192 lb 3.2 oz (87.2 kg)  11/04/20 180 lb (81.6 kg)     01-21-2021: 1st attempt left VM 01-28-2021: 2nd attempt Left VM 02-02-2021: 3rd attempt Left VM  Star Rating Drugs: Olmesartan 20 mg- Last filled 12-24-2020 [place] Clinical Pharmacist Assistant [phone]

## 2021-02-02 DIAGNOSIS — M459 Ankylosing spondylitis of unspecified sites in spine: Secondary | ICD-10-CM | POA: Diagnosis not present

## 2021-02-09 ENCOUNTER — Ambulatory Visit: Payer: Medicare Other

## 2021-02-09 DIAGNOSIS — I10 Essential (primary) hypertension: Secondary | ICD-10-CM

## 2021-02-09 DIAGNOSIS — K5180 Other ulcerative colitis without complications: Secondary | ICD-10-CM

## 2021-02-09 NOTE — Patient Instructions (Signed)
Visit Information  Goals Addressed            This Visit's Progress   . Work with SW to manage care coordination needs   On track    Timeframe:  Long-Range Goal Priority:  Low Start Date:   12.16.21                          Expected End Date:  10.5.22                    Next outreach planned for: 9.7.[name]22  Patient Goals/Self-Care Activities  patient will:   - Patient will call provider office for new concerns or questions Contact SW as needed prior to next scheduled call       Patient verbalizes understanding of instructions provided today and agrees to view in [place].   SW will follow up with the patient over the next 90 days  [name], BSW, CDP Social Worker, Certified Dementia Practitioner [place] [facility] [place] [facility] [phone]

## 2021-02-09 NOTE — Chronic Care Management (AMB) (Signed)
Social Work Note  02/09/2021 Name: Gabrielle Hawkins MRN: [id number] DOB: 05/31/1966  Gabrielle Hawkins is a 55 y.o. year old female who is a primary care patient of Gabrielle Hawkins, [place].  The Care Management team was consulted for assistance with chronic disease management and care coordination needs.  Gabrielle Hawkins was given information about Care Management services today including:  1. Care Management services include personalized support from designated clinical staff supervised by her physician, including individualized plan of care and coordination with other care providers 2. 24/7 contact phone numbers for assistance for urgent and routine care needs. 3. The patient may stop care management services at any time (effective at the end of the month) by phone call to the office staff.  Patient agreed to services and consent obtained.   Engaged with patient by telephone for follow up visit in response to provider referral for social work chronic care management and care coordination services.  Assessment: Review of patient past medical history, allergies, medications, and health status, including review of pertinent consultant reports was performed as part of comprehensive evaluation and provision of care management/care coordination services.   SDOH (Social Determinants of Health) assessments and interventions performed:  Yes SDOH Interventions   Flowsheet Row Most Recent Value  SDOH Interventions   Food Insecurity Interventions Intervention Not Indicated  Housing Interventions Intervention Not Indicated  Transportation Interventions Intervention Not Indicated       Advanced Directives Status: Not addressed in this encounter.  Care Plan  Allergies  Allergen Reactions  . Humira [Adalimumab] Anaphylaxis  . Ampicillin Hives and Itching  . Codeine   . Compazine [Prochlorperazine Maleate] Other (See Comments)    "Stroke-like symptoms" Can tolerate Phenergan.  [name] Cymbalta  [Duloxetine Hcl] Other (See Comments)    Fainting  . Imitrex [Sumatriptan Base] Hives and Nausea And Vomiting  . Lyrica [Pregabalin] Other (See Comments)    Fainting  . Metoclopramide Hcl Hives  . Percocet [Oxycodone-Acetaminophen] Itching  . Effexor [Venlafaxine Hydrochloride] Hives and Palpitations  . Tape Itching and Rash    Outpatient Encounter Medications as of 02/09/2021  Medication Sig Note  . albuterol (VENTOLIN HFA) 108 (90 Base) MCG/ACT inhaler Inhale 2 puffs into the lungs every 6 (six) hours as needed for wheezing or shortness of breath.   [name] amitriptyline (ELAVIL) 25 MG tablet Take 25 mg by mouth at bedtime.    . beclomethasone (QVAR) 80 MCG/ACT inhaler Inhale 1 puff into the lungs daily as needed (Shortness of Breath).    . busPIRone (BUSPAR) 5 MG tablet TAKE ONE TABLET BY MOUTH TWICE DAILY   . celecoxib (CELEBREX) 200 MG capsule Take 200 mg by mouth 2 (two) times daily.    . cetirizine (ZYRTEC) 10 MG tablet Take 10 mg by mouth daily.   [name] dicyclomine (BENTYL) 20 MG tablet Take 20 mg by mouth 4 (four) times daily -  before meals and at bedtime. Up to four times daily as needed 07/09/2020: Patient is taking 1-2 times daily   . diphenhydrAMINE (BENADRYL) 25 mg capsule Take 25 mg by mouth daily as needed for itching.    . eszopiclone (LUNESTA) 2 MG TABS tablet Take 1 tablet (2 mg total) by mouth at bedtime as needed for sleep. Take immediately before bedtime   . folic acid (FOLVITE) 1 MG tablet Take 3 mg by mouth daily.    [name] gabapentin (NEURONTIN) 300 MG capsule Take 2 capsules (600 mg total) by mouth 3 (three) times daily.   [name]  loperamide (IMODIUM) 2 MG capsule Take 1 capsule (2 mg total) by mouth 4 (four) times daily as needed for diarrhea or loose stools. (Patient not taking: Reported on 01/14/2021)   . mesalamine (APRISO) 0.375 g 24 hr capsule Take 375 mg by mouth daily. 4 caps In the morning   . mesalamine (CANASA) 1000 MG suppository Place 1,000 mg rectally at bedtime. 4 g/60 mL    . methotrexate (50 MG/ML) 1 g injection Inject into the vein every Monday. 0.41m every Monday   . nitrofurantoin, macrocrystal-monohydrate, (MACROBID) 100 MG capsule Take 1 capsule (100 mg total) by mouth 2 (two) times daily. (Patient not taking: Reported on 01/14/2021)   . olmesartan (BENICAR) 20 MG tablet Take 1 tablet (20 mg total) by mouth daily.   . ondansetron (ZOFRAN) 4 MG tablet Take 4 mg by mouth every 6 (six) hours as needed for nausea or vomiting.   . predniSONE (DELTASONE) 10 MG tablet Take 4 tablets (40 mg total) by mouth daily.   .[name] (ZANAFLEX) 2 MG tablet Take 2 mg by mouth 3 (three) times daily. 07/09/2020: Patient is taking once daily at bedtime   . traMADol (ULTRAM) 50 MG tablet Take 1 tablet (50 mg total) by mouth every 6 (six) hours as needed.   . Vitamin D, Ergocalciferol, (DRISDOL) 1.25 MG (50000 UNIT) CAPS capsule Take 1 capsule (50,000 Units total) by mouth 2 (two) times a week.   . [DISCONTINUED] inFLIXimab (REMICADE) 100 MG injection Inject 100 mg into the vein every 30 (thirty) days. Infuse by intravenous route every 4 weeks  Patient is receiving Inflectra brand verses Remicade brand due to insurance (Patient not taking: Reported on 04/10/2020)   . [DISCONTINUED] temazepam (RESTORIL) 7.5 MG capsule Take 1 capsule (7.5 mg total) by mouth at bedtime as needed for sleep. (Patient not taking: Reported on 07/09/2020)    No facility-administered encounter medications on file as of 02/09/2021.    Patient Active Problem List   Diagnosis Date Noted  . Right-sided low back pain with right-sided sciatica 05/06/2019  . Right sided numbness 03/27/2019  . Paresthesia 12/25/2018  . Essential hypertension 06/26/2018  . Abdominal pain, chronic, right lower quadrant 01/06/2015  . Nausea and vomiting 01/05/2015  . Ulcerative colitis ([place] 01/05/2015  . Enteritis   . Nausea with vomiting   . Intractable nausea and vomiting 04/29/2014  . Benign paroxysmal positional vertigo  03/14/2013  . Lymphedema of arm 12/30/2011    Conditions to be addressed/monitored: HTN and Ulcerative Colitis  Care Plan : Social Work Care Plan  Updates made by HDaneen Schicksince 02/09/2021 12:00 AM    Problem: Care Coordination     Long-Range Goal: Collaborate with RN Care Manager to perform appropriate assessments to assist with care coordination needs   Start Date: 08/20/2020  Expected End Date: 06/09/2021  This Visit's Progress: On track  Recent Progress: On track  Priority: Low  Note:   Current Barriers:  . Financial constraints related to recent loss of Disability benefit . Chronic conditions including HTN and Crohn's Disease which put patient at increased risk of hospitalization  Social Work Clinical Goal(s):  .  patient will work with SW to address concerns related to care coordination  Interventions: . 1:1 collaboration with MMinette [place] development and update of comprehensive plan of care as evidenced by provider attestation and co-signature . Inter-disciplinary care team collaboration (see longitudinal plan of care) . Successful outbound call placed to the patient to assess care coordination needs .  Performed chart review to note recent ED visit on 5.12.22 due to Ulcerative Colitis . Discussed the patient is feeling better, she was discharged from the ED with a prescription of Prednisone 40 mg daily and was instructed to follow up with Dr. Benson Norway . Patient confirms she has seen Dr. Benson Norway since recent ED visit . Patient received her infusion last Tuesday 5.31.22 and reports she will receive a second infusion at the end of this month . Patient acknowledges how much better she feels after infusions and is happy to be starting these again . Determined the patient continues to work full time and is covered by Medicare Part A and a secondary health plan called "[employer]" which she purchased through the marketplace . No acute SDoH challenges noted at  this time . Discussed long term follow up planned by SW- patient agreeable . Scheduled follow up call over the next 90 days  Patient Goals/Self-Care Activities  patient will:   - Patient will call provider office for new concerns or questions Contact SW as needed prior to next scheduled call   Follow up Plan: SW will follow up with patient by phone over the next 90 days       Follow Up Plan: SW will follow up with patient by phone over the next 90 days.      [name], BSW, CDP Social Worker, Certified Dementia Practitioner [place] [facility] [phone]

## 2021-02-19 ENCOUNTER — Telehealth: Payer: Medicare Other

## 2021-02-19 ENCOUNTER — Telehealth: Payer: Self-pay

## 2021-02-19 NOTE — Telephone Encounter (Signed)
  Care Management   Follow Up Note   02/19/2021 Name: Gabrielle Hawkins MRN: [id number] DOB: November 18, 1965   Referred by: [name], FNP Reason for referral : Chronic Care Management (RNCM Follow up call - 1st attempt )   An unsuccessful telephone outreach was attempted today. The patient was referred to the case management team for assistance with care management and care coordination.   Follow Up Plan: A HIPPA compliant phone message was left for the patient providing contact information and requesting a return call.   Barb Merino, RN, BSN, CCM Care Management Coordinator [place] [facility]  Direct Phone: [phone]

## 2021-02-23 ENCOUNTER — Other Ambulatory Visit: Payer: Self-pay | Admitting: Nurse Practitioner

## 2021-02-23 ENCOUNTER — Telehealth: Payer: Self-pay

## 2021-02-23 DIAGNOSIS — G4700 Insomnia, unspecified: Secondary | ICD-10-CM

## 2021-02-23 NOTE — Chronic Care Management (AMB) (Signed)
Chronic Care Management Pharmacy Assistant   Name: Gabrielle Hawkins  MRN: [id number] DOB: 1966/08/01   Reason for Encounter: Disease State/ Hypertension  Recent office visits:  11-17-2020 Gabrielle Hawkins, [place]. START Lunesta 2 mg one tablet at mouth at bedtime as needed.   11-19-2020 [place], Gabrielle Stapler, RN (CCM)  02-09-2021 Gabrielle Hawkins (CCM)  Recent consult visits:  12-14-2020 Gabrielle Ada MD (Gastroenterology)   12-24-2020 Gabrielle Nunnery, MD (Otolaryngology)   01-05-2021 Gabrielle Nunnery, MD (Otolaryngology)   01-12-2021 Gabrielle Nunnery, MD (Otolaryngology)   01-18-2021 Gabrielle Ada MD (Gastroenterology)  Hospital visits:  Medication Reconciliation was completed by comparing discharge summary, patient's EMR and Pharmacy list, and upon discussion with patient.   Admitted to the hospital on 01-14-2021 due to Ulcerative colitis with complication. Discharge date was 01-14-2021. Discharged from [facility]?Medications Started at [facility] Discharge:?? -started Prednisone 10 mg due to Ulcerative colitis    Medication Changes at Hospital Discharge: None   Medications Discontinued at Hospital Discharge: None   Medications that remain the same after Hospital Discharge:?? -All other medications will remain the same.    Medications: Outpatient Encounter Medications as of 02/23/2021  Medication Sig Note   albuterol (VENTOLIN HFA) 108 (90 Base) MCG/ACT inhaler Inhale 2 puffs into the lungs every 6 (six) hours as needed for wheezing or shortness of breath.    amitriptyline (ELAVIL) 25 MG tablet Take 25 mg by mouth at bedtime.     beclomethasone (QVAR) 80 MCG/ACT inhaler Inhale 1 puff into the lungs daily as needed (Shortness of Breath).     busPIRone (BUSPAR) 5 MG tablet TAKE ONE TABLET BY MOUTH TWICE DAILY    celecoxib (CELEBREX) 200 MG capsule Take 200 mg by mouth 2 (two) times daily.     cetirizine (ZYRTEC) 10 MG tablet Take 10 mg by  mouth daily.    dicyclomine (BENTYL) 20 MG tablet Take 20 mg by mouth 4 (four) times daily -  before meals and at bedtime. Up to four times daily as needed 07/09/2020: Patient is taking 1-2 times daily    diphenhydrAMINE (BENADRYL) 25 mg capsule Take 25 mg by mouth daily as needed for itching.     eszopiclone (LUNESTA) 2 MG TABS tablet Take 1 tablet (2 mg total) by mouth at bedtime as needed for sleep. Take immediately before bedtime    folic acid (FOLVITE) 1 MG tablet Take 3 mg by mouth daily.     gabapentin (NEURONTIN) 300 MG capsule Take 2 capsules (600 mg total) by mouth 3 (three) times daily.    loperamide (IMODIUM) 2 MG capsule Take 1 capsule (2 mg total) by mouth 4 (four) times daily as needed for diarrhea or loose stools. (Patient not taking: Reported on 01/14/2021)    mesalamine (APRISO) 0.375 g 24 hr capsule Take 375 mg by mouth daily. 4 caps In the morning    mesalamine (CANASA) 1000 MG suppository Place 1,000 mg rectally at bedtime. 4 g/60 mL    methotrexate (50 MG/ML) 1 g injection Inject into the vein every Monday. 0.23m every Monday    nitrofurantoin, macrocrystal-monohydrate, (MACROBID) 100 MG capsule Take 1 capsule (100 mg total) by mouth 2 (two) times daily. (Patient not taking: Reported on 01/14/2021)    olmesartan (BENICAR) 20 MG tablet Take 1 tablet (20 mg total) by mouth daily.    ondansetron (ZOFRAN) 4 MG tablet Take 4 mg by mouth every 6 (six) hours as needed for nausea or vomiting.  predniSONE (DELTASONE) 10 MG tablet Take 4 tablets (40 mg total) by mouth daily.    tiZANidine (ZANAFLEX) 2 MG tablet Take 2 mg by mouth 3 (three) times daily. 07/09/2020: Patient is taking once daily at bedtime    traMADol (ULTRAM) 50 MG tablet Take 1 tablet (50 mg total) by mouth every 6 (six) hours as needed.    Vitamin D, Ergocalciferol, (DRISDOL) 1.25 MG (50000 UNIT) CAPS capsule Take 1 capsule (50,000 Units total) by mouth 2 (two) times a week.    [DISCONTINUED] inFLIXimab (REMICADE) 100 MG  injection Inject 100 mg into the vein every 30 (thirty) days. Infuse by intravenous route every 4 weeks  Patient is receiving Inflectra brand verses Remicade brand due to insurance (Patient not taking: Reported on 04/10/2020)    [DISCONTINUED] temazepam (RESTORIL) 7.5 MG capsule Take 1 capsule (7.5 mg total) by mouth at bedtime as needed for sleep. (Patient not taking: Reported on 07/09/2020)    No facility-administered encounter medications on file as of 02/23/2021.     02-23-2021: 1st attempt left VM 03-01-2021: 2nd attempt left VM 03-03-2021: 3rd attempt LVM   Star Rating Drugs: Olmesartan 20 mg- Last filled 12-24-2020 [place] Clinical Pharmacist Assistant [phone]

## 2021-03-02 DIAGNOSIS — K5190 Ulcerative colitis, unspecified, without complications: Secondary | ICD-10-CM | POA: Diagnosis not present

## 2021-03-02 DIAGNOSIS — M459 Ankylosing spondylitis of unspecified sites in spine: Secondary | ICD-10-CM | POA: Diagnosis not present

## 2021-03-18 ENCOUNTER — Other Ambulatory Visit: Payer: Self-pay | Admitting: Internal Medicine

## 2021-03-18 DIAGNOSIS — Z1231 Encounter for screening mammogram for malignant neoplasm of breast: Secondary | ICD-10-CM

## 2021-03-24 DIAGNOSIS — M2550 Pain in unspecified joint: Secondary | ICD-10-CM | POA: Diagnosis not present

## 2021-03-24 DIAGNOSIS — Z79899 Other long term (current) drug therapy: Secondary | ICD-10-CM | POA: Diagnosis not present

## 2021-03-24 DIAGNOSIS — M459 Ankylosing spondylitis of unspecified sites in spine: Secondary | ICD-10-CM | POA: Diagnosis not present

## 2021-03-24 DIAGNOSIS — K5100 Ulcerative (chronic) pancolitis without complications: Secondary | ICD-10-CM | POA: Diagnosis not present

## 2021-04-07 ENCOUNTER — Telehealth: Payer: Medicare Other

## 2021-04-07 ENCOUNTER — Ambulatory Visit: Payer: Self-pay

## 2021-04-07 DIAGNOSIS — M5441 Lumbago with sciatica, right side: Secondary | ICD-10-CM

## 2021-04-07 DIAGNOSIS — I10 Essential (primary) hypertension: Secondary | ICD-10-CM

## 2021-04-07 DIAGNOSIS — K5180 Other ulcerative colitis without complications: Secondary | ICD-10-CM

## 2021-04-07 DIAGNOSIS — G8929 Other chronic pain: Secondary | ICD-10-CM

## 2021-04-07 DIAGNOSIS — E559 Vitamin D deficiency, unspecified: Secondary | ICD-10-CM

## 2021-04-14 NOTE — Progress Notes (Signed)
This encounter was created in error - please disregard.

## 2021-04-14 NOTE — Chronic Care Management (AMB) (Addendum)
Care Management   CCM RN Visit Note  04/07/2021 Name: Gabrielle Hawkins MRN: [id number] DOB: [birth year]  Subjective: Gabrielle Hawkins is a 55 y.o. year old female who is a primary care patient of [name], [place]. The care management team was consulted for assistance with disease management and care coordination needs.    Engaged with patient by telephone for follow up visit in response to provider referral for case management and/or care coordination services.   Consent to Services:  The patient was given information about Chronic Care Management services, agreed to services, and gave verbal consent prior to initiation of services.  Please see initial visit note for detailed documentation.   Patient agreed to services and verbal consent obtained.   Assessment: Review of patient past medical history, allergies, medications, health status, including review of consultants reports, laboratory and other test data, was performed as part of comprehensive evaluation and provision of chronic care management services.   SDOH (Social Determinants of Health) assessments and interventions performed:  Yes, no acute challenges   CCM Care Plan  Allergies  Allergen Reactions   Humira [Adalimumab] Anaphylaxis   Ampicillin Hives and Itching   Codeine    Compazine [Prochlorperazine Maleate] Other (See Comments)    "Stroke-like symptoms" Can tolerate Phenergan.   Cymbalta [Duloxetine Hcl] Other (See Comments)    Fainting   Imitrex [Sumatriptan Base] Hives and Nausea And Vomiting   Lyrica [Pregabalin] Other (See Comments)    Fainting   Metoclopramide Hcl Hives   Percocet [Oxycodone-Acetaminophen] Itching   Effexor [Venlafaxine Hydrochloride] Hives and Palpitations   Tape Itching and Rash    Outpatient Encounter Medications as of 04/07/2021  Medication Sig Note   albuterol (VENTOLIN HFA) 108 (90 Base) MCG/ACT inhaler Inhale 2 puffs into the lungs every 6 (six) hours as needed for wheezing or  shortness of breath.    amitriptyline (ELAVIL) 25 MG tablet Take 25 mg by mouth at bedtime.     beclomethasone (QVAR) 80 MCG/ACT inhaler Inhale 1 puff into the lungs daily as needed (Shortness of Breath).     busPIRone (BUSPAR) 5 MG tablet TAKE ONE TABLET BY MOUTH TWICE DAILY    celecoxib (CELEBREX) 200 MG capsule Take 200 mg by mouth 2 (two) times daily.     cetirizine (ZYRTEC) 10 MG tablet Take 10 mg by mouth daily.    dicyclomine (BENTYL) 20 MG tablet Take 20 mg by mouth 4 (four) times daily -  before meals and at bedtime. Up to four times daily as needed 07/09/2020: Patient is taking 1-2 times daily    diphenhydrAMINE (BENADRYL) 25 mg capsule Take 25 mg by mouth daily as needed for itching.     eszopiclone (LUNESTA) 2 MG TABS tablet TAKE ONE TABLET by MOUTH AT bedtime as needed FOR SLEEP. Take immediately BEFORE bedtime    folic acid (FOLVITE) 1 MG tablet Take 3 mg by mouth daily.     gabapentin (NEURONTIN) 300 MG capsule Take 2 capsules (600 mg total) by mouth 3 (three) times daily.    loperamide (IMODIUM) 2 MG capsule Take 1 capsule (2 mg total) by mouth 4 (four) times daily as needed for diarrhea or loose stools. (Patient not taking: Reported on 01/14/2021)    mesalamine (APRISO) 0.375 g 24 hr capsule Take 375 mg by mouth daily. 4 caps In the morning    mesalamine (CANASA) 1000 MG suppository Place 1,000 mg rectally at bedtime. 4 g/60 mL    methotrexate (50 MG/ML) 1 g injection  Inject into the vein every Monday. 0.72m every Monday    nitrofurantoin, macrocrystal-monohydrate, (MACROBID) 100 MG capsule Take 1 capsule (100 mg total) by mouth 2 (two) times daily. (Patient not taking: Reported on 01/14/2021)    olmesartan (BENICAR) 20 MG tablet Take 1 tablet (20 mg total) by mouth daily.    ondansetron (ZOFRAN) 4 MG tablet Take 4 mg by mouth every 6 (six) hours as needed for nausea or vomiting.    predniSONE (DELTASONE) 10 MG tablet Take 4 tablets (40 mg total) by mouth daily.    tiZANidine  (ZANAFLEX) 2 MG tablet Take 2 mg by mouth 3 (three) times daily. 07/09/2020: Patient is taking once daily at bedtime    traMADol (ULTRAM) 50 MG tablet Take 1 tablet (50 mg total) by mouth every 6 (six) hours as needed.    Vitamin D, Ergocalciferol, (DRISDOL) 1.25 MG (50000 UNIT) CAPS capsule Take 1 capsule (50,000 Units total) by mouth 2 (two) times a week.    No facility-administered encounter medications on file as of 04/07/2021.    Patient Active Problem List   Diagnosis Date Noted   Right-sided low back pain with right-sided sciatica 05/06/2019   Right sided numbness 03/27/2019   Paresthesia 12/25/2018   Essential hypertension 06/26/2018   Abdominal pain, chronic, right lower quadrant 01/06/2015   Nausea and vomiting 01/05/2015   Ulcerative colitis ([place] 01/05/2015   Enteritis    Nausea with vomiting    Intractable nausea and vomiting 04/29/2014   Benign paroxysmal positional vertigo 03/14/2013   Lymphedema of arm 12/30/2011    Conditions to be addressed/monitored:HTN and Ulcerative Colitis, Ankylosing Spondylitis, Vitamin D deficiency   Care Plan : Ulcerative Colitis  Updates made by LLynne Logan RN since 04/07/2021 12:00 AM     Problem: Symptoms (Ulcerative Colitis)   Priority: Medium     Long-Range Goal: Symptoms Managed-Ulcerative Colitis   Start Date: 11/19/2020  Expected End Date: 11/19/2021  Recent Progress: On track  Priority: Medium  Note:   Current Barriers:  Ineffective Self Health Maintenance Clinical Goal(s):  Collaboration with MMinette Brine FNP regarding development and update of comprehensive plan of care as evidenced by provider attestation and co-signature Inter-disciplinary care team collaboration (see longitudinal plan of care) patient will work with care management team to address care coordination and chronic disease management needs related to Disease Management Educational Needs Care Coordination Medication Management and  Education Psychosocial Support   Interventions:  04/07/21 completed successful outbound call with patient  Evaluation of current treatment plan related to Ulcerative Colitis and ankylosing spondylitis, self-management and patient's adherence to plan as established by provider. Collaboration with MMinette Brine FNP regarding development and update of comprehensive plan of care as evidenced by provider attestation       and co-signature Inter-disciplinary care team collaboration (see longitudinal plan of care) Review of patient status, including review of consultant's reports, relevant laboratory and other test results, and medications completed. Reviewed medications with patient and discussed importance of medication adherence Reviewed and discussed recent Assessment/Plan following MD follow up with Dr. HLenna Gilfordon 03/24/21 with the following noted patient has started Simponi Aria, every 3 months for UC and ankylosing spondylitis Determined patient's symptoms are improving however, she is experiencing wearing off effects right before her next infusion, patient states Dr. HLenna Gilfordis aware and may increase dosage or frequency  Educated on potentials triggers for exacerbation and when to call her doctor  Encouraged patient to balance her activity with rest, getting at least 8 hours  of sleep per night, eating a well balanced diet and increasing her water intake to 64 oz daily  Discussed plans with patient for ongoing care management follow up and provided patient with direct contact information for care management team Self Care Activities:  begin a symptom diary bring symptom diary to all appointments track what makes symptoms worse and what makes them better Keep all scheduled MD appointments Contact PCP for questions or concerns Call the pharmacy to refill your medications at least 7 days before you run out of medication  Patient Goals: - Manage my symptoms related to Crohn's disease and Ankylosing  Spondylitis   Follow Up Plan: Telephone follow up appointment with care management team member scheduled for: 07/08/21     Care Plan : High Risk for Infection (Recurrent UTI)  Updates made by Lynne Logan, RN since 04/07/2021 12:00 AM  Completed 04/14/2021   Problem: Recurrent UTI Resolved 04/07/2021  Priority: High     Long-Range Goal: Recurrent UTI minimized or prevented Completed 04/07/2021  Start Date: 11/19/2020  Expected End Date: 05/21/2021  Recent Progress: On track  Priority: High  Note:   Current Barriers:  Ineffective Self Health Maintenance Clinical Goal(s):  Collaboration with [name], FNP regarding development and update of comprehensive plan of care as evidenced by provider attestation and co-signature Inter-disciplinary care team collaboration (see longitudinal plan of care) patient will work with care management team to address care coordination and chronic disease management needs related to Disease Management Educational Needs Care Coordination Medication Management and Education Psychosocial Support   Interventions:  04/07/21 completed successful outbound call with patient  Evaluation of current treatment plan related to   HTN, Thyroid nodule, Vitamin D deficiency, Ulcerative Colitis, fatigue, right sided low back pain with sciatica  , self-management and patient's adherence to plan as established by provider. Collaboration with [name], FNP regarding development and update of comprehensive plan of care as evidenced by provider attestation       and co-signature Inter-disciplinary care team collaboration (see longitudinal plan of care) Determined patient has experienced no further UTI's since last diagnosed/treated in October 2021 Educated patient on importance of drinking at least 64 oz of water per day unless otherwise directed Encouraged on perineal hygiene and when to call the doctor if symptoms reoccur, educated on prompt reporting of symptoms will  help get early treatment and help prevent ED/IP events  Discussed plans with patient for ongoing care management follow up and provided patient with direct contact information for care management team Self Care Activities:  Drink 64 oz of water daily Call [facility] to schedule a new patient appointment for evaluation and treatment of recurrent UTI Report early symptoms suggestive of UTI promptly in order to seek treatment early  Avoid caffeinated drinks  Patient Goals: - completed follow up with Urology for evaluation and treatment of recurrent UTI  Follow Up Plan: Follow up with provider re: recurrent UTI as directed        Care Plan : Vitamin D deficiency  Updates made by Lynne Logan, RN since 04/07/2021 12:00 AM     Problem: Vitamin D deficiency   Priority: High     Long-Range Goal: Vitamin D deficiency improved or resolved   Start Date: 11/19/2020  Expected End Date: 11/18/2021  Recent Progress: On track  Priority: High  Note:   Current Barriers:  Ineffective Self Health Maintenance Clinical Goal(s):  Collaboration with [name], FNP regarding development and update of comprehensive plan of care  as evidenced by provider attestation and co-signature Inter-disciplinary care team collaboration (see longitudinal plan of care) patient will work with care management team to address care coordination and chronic disease management needs related to Disease Management Educational Needs Care Coordination Medication Management and Education Psychosocial Support   Interventions:  04/07/21 completed successful outbound call with patient  Evaluation of current treatment plan related to  HTN, Thyroid nodule, Vitamin D deficiency, Ulcerative Colitis, fatigue, right sided low back pain with sciatica , self-management and patient's adherence to plan as established by provider. Collaboration with [name], FNP regarding development and update of comprehensive plan of care as  evidenced by provider attestation       and co-signature Inter-disciplinary care team collaboration (see longitudinal plan of care) Provided education to patient about basic disease process related to Vitamin D deficiency  Review of patient status, including review of consultant's reports, relevant laboratory and other test results, and medications completed. Reviewed medications with patient and discussed importance of medication adherence; Determined patient has not been taking the Vitamin D supplement as prescribed  Reviewed and informed patient of PCP recommendations for treatment of Vitamin D deficiency and instructed patient on dosing and frequency Re-educated on the importance to eat a Vitamin D rich diet, get at least 15 minutes of natural sunlight when possible, take Vitamin D supplement as directed  Discussed plans with patient for ongoing care management follow up and provided patient with direct contact information for care management team Self Care Activities:  Keep all scheduled MD appointments Contact PCP for questions or concerns Call the pharmacy to refill your medications at least 7 days before you run out of medication  Start Vitamin D 3 and take exactly as directed w/o missed doses Get at least 15 minutes of natural sunlight when able Eat Vitamin D rich foods  Patient Goals: - to improve and or resolve Vitamin D deficiency and have less fatigue   Follow Up Plan: Telephone follow up appointment with care management team member scheduled for: 07/08/21     Care Plan : Evaluation of Thyroid nodule  Updates made by Lynne Logan, RN since 04/07/2021 12:00 AM  Completed 04/14/2021   Problem: Evaluate and Treat abnormal thyroid nodule Resolved 04/07/2021  Priority: High     Long-Range Goal: Evaluate and treat thyroid nodule Completed 04/07/2021  Start Date: 11/23/2020  Expected End Date: 05/21/2021  Recent Progress: On track  Priority: High  Note:   Current Barriers:   Ineffective Self Health Maintenance Clinical Goal(s):  Collaboration with [name], FNP regarding development and update of comprehensive plan of care as evidenced by provider attestation and co-signature Inter-disciplinary care team collaboration (see longitudinal plan of care) patient will work with care management team to address care coordination and chronic disease management needs related to Disease Management Educational Needs Care Coordination Medication Management and Education Psychosocial Support   Interventions:  04/07/21 completed successful outbound call with patient  Evaluation of current treatment plan related to  HTN, Thyroid nodule, Vitamin D deficiency, Ulcerative Colitis, fatigue, right sided low back pain with sciatica , self-management and patient's adherence to plan as established by provider. Collaboration with [name], FNP regarding development and update of comprehensive plan of care as evidenced by provider attestation       and co-signature Inter-disciplinary care team collaboration (see longitudinal plan of care) Determined patient underwent a fine needle aspiration biopsy of a small thyroid nodule with results proving to be benign and no further follow up was  recommended unless this nodule enlarges Discussed plans with patient for ongoing care management follow up and provided patient with direct contact information for care management team Self Care Activities:  Complete thyroid US as recommended and as scheduled  Patient Goals: - to have a thyroid ultrasound for evaluation of thyroid nodule   Follow Up Plan: Follow up with provider re: as directed        Plan:Telephone follow up appointment with care management team member scheduled for:  07/08/21  Barb Merino, RN, BSN, CCM Care Management Coordinator [place] [facility]  Direct Phone: [phone]

## 2021-04-14 NOTE — Patient Instructions (Signed)
Goals Addressed       COMPLETED: Evaluate and Treat thyroid nodule        Timeframe:  Long-Range Goal Priority:  High Start Date: 11/19/20                            Expected End Date:  05/21/21  Next Follow Up date: 02/19/21  Patient Self-Care Activities:  Complete thyroid US as recommended and as scheduled for evaluation and treatment of thyroid nodule                         COMPLETED: Recurrent UTI - minmized or prevented        Timeframe:  Long-Range Goal Priority:  High Start Date: 11/19/20                            Expected End Date: 05/21/21      Patient Self Care Activities:  Drink 64 oz of water daily Call [facility] to schedule a new patient appointment for evaluation and treatment of recurrent UTI Report early symptoms suggestive of UTI promptly in order to seek treatment early  Avoid caffeinated drinks                         Track My Symptoms-Ulcerative Colitis/Ankylosing Spondylitis   On track     Timeframe:  Long-Range Goal Priority:  Medium Start Date:  11/19/20                           Expected End Date: 11/19/21                     Follow Up Date: 07/08/21   - begin a symptom diary - bring symptom diary to all appointments - track what makes symptoms worse and what makes them better  Keep all scheduled MD appointments Contact PCP for questions or concerns Call the pharmacy to refill your medications at least 7 days before you run out of medication    Why is this important?   Keeping track of symptoms that you have helps you to understand your condition. You will also learn what works to manage it.  You and your doctor will then be able to come up with the best treatment plan for you.    Notes:       Vitamin D deficiency improved or resolved   On track     Timeframe:  Long-Range Goal Priority:  High Start Date:  11/19/20                          Expected End Date:  11/19/21  Next Follow Up Date: 07/08/21  Self Care Activities:  Keep all  scheduled MD appointments Contact PCP for questions or concerns Call the pharmacy to refill your medications at least 7 days before you run out of medication  Start Vitamin D 3 and take exactly as directed w/o missed doses Get at least 15 minutes of natural sunlight when able Eat Vitamin D rich foods

## 2021-04-21 ENCOUNTER — Ambulatory Visit: Payer: BC Managed Care – PPO | Admitting: Nurse Practitioner

## 2021-04-21 ENCOUNTER — Other Ambulatory Visit: Payer: Self-pay

## 2021-04-21 ENCOUNTER — Encounter: Payer: Self-pay | Admitting: Nurse Practitioner

## 2021-04-21 VITALS — BP 120/84 | HR 85 | Temp 98.3°F | Ht 68.0 in | Wt 191.6 lb

## 2021-04-21 DIAGNOSIS — I10 Essential (primary) hypertension: Secondary | ICD-10-CM

## 2021-04-21 DIAGNOSIS — Z23 Encounter for immunization: Secondary | ICD-10-CM | POA: Diagnosis not present

## 2021-04-21 DIAGNOSIS — F419 Anxiety disorder, unspecified: Secondary | ICD-10-CM

## 2021-04-21 DIAGNOSIS — G4700 Insomnia, unspecified: Secondary | ICD-10-CM

## 2021-04-21 MED ORDER — OLMESARTAN MEDOXOMIL 20 MG PO TABS: 20.0000 mg | ORAL_TABLET | Freq: Every day | ORAL | 1 refills | Status: DC

## 2021-04-21 MED ORDER — BUSPIRONE HCL 5 MG PO TABS: 5.0000 mg | ORAL_TABLET | Freq: Two times a day (BID) | ORAL | 1 refills | Status: DC

## 2021-04-21 MED ORDER — SHINGRIX 50 MCG/0.5ML IM SUSR: 0.5000 mL | Freq: Once | INTRAMUSCULAR | 0 refills | Status: AC

## 2021-04-21 MED ORDER — ESZOPICLONE 2 MG PO TABS: ORAL_TABLET | ORAL | 5 refills | Status: DC

## 2021-04-21 MED ORDER — PREVNAR 20 0.5 ML IM SUSY: 0.5000 mL | PREFILLED_SYRINGE | INTRAMUSCULAR | 0 refills | Status: AC
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
## 2021-04-21 NOTE — Patient Instructions (Signed)
Cooking With Less Salt Cooking with less salt is one way to reduce the amount of sodium you get from food. Sodium is one of the elements that make up salt. It is found naturally in foods and is also added to certain foods. Depending on your condition and overall health, your health care provider or dietitian may recommend that you reduce your sodium intake. Most people should have less than 2,300 milligrams (mg) of sodium each day. If you have high blood pressure (hypertension), you may need to limit your sodium to 1,500 mg each day. Follow the tipsbelow to help reduce your sodium intake. What are tips for eating less sodium? Reading food labels  Check the food label before buying or using packaged ingredients. Always check the label for the serving size and sodium content. Look for products with no more than 140 mg of sodium in one serving. Check the % Daily Value column to see what percent of the daily recommended amount of sodium is provided in one serving of the product. Foods with 5% or less in this column are considered low in sodium. Foods with 20% or higher are considered high in sodium. Do not choose foods with salt as one of the first three ingredients on the ingredients list. If salt is one of the first three ingredients, it usually means the item is high in sodium.  Shopping Buy sodium-free or low-sodium products. Look for the following words on food labels: Low-sodium. Sodium-free. Reduced-sodium. No salt added. Unsalted. Always check the sodium content even if foods are labeled as low-sodium or no salt added. Buy fresh foods. Cooking Use herbs, seasonings without salt, and spices as substitutes for salt. Use sodium-free baking soda when baking. Grill, braise, or roast foods to add flavor with less salt. Avoid adding salt to pasta, rice, or hot cereals. Drain and rinse canned vegetables, beans, and meat before use. Avoid adding salt when cooking sweets and desserts. Cook with  low-sodium ingredients. What foods are high in sodium? Vegetables Regular canned vegetables (not low-sodium or reduced-sodium). Sauerkraut, pickled vegetables, and relishes. Olives. French fries. Onion rings. Regular canned tomato sauce and paste. Regular tomato and vegetable juice. Frozenvegetables in sauces. Grains Instant hot cereals. Bread stuffing, pancake, and biscuit mixes. Croutons. Seasoned rice or pasta mixes. Noodle soup cups. Boxed or frozen macaroni and cheese. Regular salted crackers. Self-rising flour. Rolls. Bagels. Flourtortillas and wraps. Meats and other proteins Meat or fish that is salted, canned, smoked, cured, spiced, or pickled. This includes bacon, ham, sausages, hot dogs, corned beef, chipped beef, meat loaves, salt pork, jerky, pickled herring, anchovies, regular canned tuna, andsardines. Salted nuts. Dairy Processed cheese and cheese spreads. Cheese curds. Blue cheese. Feta cheese.String cheese. Regular cottage cheese. Buttermilk. Canned milk. The items listed above may not be a complete list of foods high in sodium. Actual amounts of sodium may be different depending on processing. Contact a dietitian for more information. What foods are low in sodium? Fruits Fresh, frozen, or canned fruit with no sauce added. Fruit juice. Vegetables Fresh or frozen vegetables with no sauce added. "No salt added" canned vegetables. "No salt added" tomato sauce and paste. Low-sodium orreduced-sodium tomato and vegetable juice. Grains Noodles, pasta, quinoa, rice. Shredded or puffed wheat or puffed rice. Regular or quick oats (not instant). Low-sodium crackers. Low-sodium bread. Whole-grainbread and whole-grain pasta. Unsalted popcorn. Meats and other proteins Fresh or frozen whole meats, poultry (not injected with sodium), and fish with no sauce added. Unsalted nuts. Dried peas, beans, and   lentils without added salt. Unsalted canned beans. Eggs. Unsalted nut butters. Low-sodium canned  tunaor chicken. Dairy Milk. Soy milk. Yogurt. Low-sodium cheeses, such as Swiss, Monterey Jack, mozzarella, and ricotta. Sherbet or ice cream (keep to  cup per serving).Cream cheese. Fats and oils Unsalted butter or margarine. Other foods Homemade pudding. Sodium-free baking soda and baking powder. Herbs and spices.Low-sodium seasoning mixes. Beverages Coffee and tea. Carbonated beverages. The items listed above may not be a complete list of foods low in sodium. Actual amounts of sodium may be different depending on processing. Contact a dietitian for more information. What are some salt alternatives when cooking? The following are herbs, seasonings, and spices that can be used instead of salt to flavor your food. Herbs should be fresh or dried. Do not choose packaged mixes. Next to the name of the herb, spice, or seasoning aresome examples of foods you can pair it with. Herbs Bay leaves - Soups, meat and vegetable dishes, and spaghetti sauce. Basil - Italian dishes, soups, pasta, and fish dishes. Cilantro - Meat, poultry, and vegetable dishes. Chili powder - Marinades and Mexican dishes. Chives - Salad dressings and potato dishes. Cumin - Mexican dishes, couscous, and meat dishes. Dill - Fish dishes, sauces, and salads. Fennel - Meat and vegetable dishes, breads, and cookies. Garlic (do not use garlic salt) - Italian dishes, meat dishes, salad dressings, and sauces. Marjoram - Soups, potato dishes, and meat dishes. Oregano - Pizza and spaghetti sauce. Parsley - Salads, soups, pasta, and meat dishes. Rosemary - Italian dishes, salad dressings, soups, and red meats. Saffron - Fish dishes, pasta, and some poultry dishes. Sage - Stuffings and sauces. Tarragon - Fish and poultry dishes. Thyme - Stuffing, meat, and fish dishes. Seasonings Lemon juice - Fish dishes, poultry dishes, vegetables, and salads. Vinegar - Salad dressings, vegetables, and fish dishes. Spices Cinnamon - Sweet  dishes, such as cakes, cookies, and puddings. Cloves - Gingerbread, puddings, and marinades for meats. Curry - Vegetable dishes, fish and poultry dishes, and stir-fry dishes. Ginger - Vegetable dishes, fish dishes, and stir-fry dishes. Nutmeg - Pasta, vegetables, poultry, fish dishes, and custard. Summary Cooking with less salt is one way to reduce the amount of sodium that you get from food. Buy sodium-free or low-sodium products. Check the food label before using or buying packaged ingredients. Use herbs, seasonings without salt, and spices as substitutes for salt in foods. This information is not intended to replace advice given to you by your health care provider. Make sure you discuss any questions you have with your healthcare provider. Document Revised: 08/14/2019 Document Reviewed: 08/14/2019 Elsevier Patient Education  2022 Elsevier Inc.  

## 2021-04-22 ENCOUNTER — Other Ambulatory Visit (INDEPENDENT_AMBULATORY_CARE_PROVIDER_SITE_OTHER): Payer: Self-pay | Admitting: Otolaryngology

## 2021-04-27 DIAGNOSIS — M459 Ankylosing spondylitis of unspecified sites in spine: Secondary | ICD-10-CM | POA: Diagnosis not present

## 2021-04-27 DIAGNOSIS — Z79899 Other long term (current) drug therapy: Secondary | ICD-10-CM | POA: Diagnosis not present

## 2021-04-27 DIAGNOSIS — K5190 Ulcerative colitis, unspecified, without complications: Secondary | ICD-10-CM | POA: Diagnosis not present

## 2021-04-29 ENCOUNTER — Other Ambulatory Visit (INDEPENDENT_AMBULATORY_CARE_PROVIDER_SITE_OTHER): Payer: Self-pay | Admitting: Otolaryngology

## 2021-04-29 MED ORDER — OMEPRAZOLE 40 MG PO CPDR: 40.0000 mg | DELAYED_RELEASE_CAPSULE | Freq: Every day | ORAL | 3 refills | Status: AC

## 2021-05-04 ENCOUNTER — Encounter: Payer: Self-pay | Admitting: Nurse Practitioner

## 2021-05-04 ENCOUNTER — Ambulatory Visit: Payer: BC Managed Care – PPO

## 2021-05-12 ENCOUNTER — Telehealth: Payer: Medicare Other

## 2021-05-21 ENCOUNTER — Ambulatory Visit: Payer: Medicare Other

## 2021-05-21 DIAGNOSIS — I10 Essential (primary) hypertension: Secondary | ICD-10-CM

## 2021-05-21 DIAGNOSIS — K5180 Other ulcerative colitis without complications: Secondary | ICD-10-CM

## 2021-05-21 NOTE — Chronic Care Management (AMB) (Signed)
Social Work Note  05/21/2021 Name: Gabrielle Hawkins MRN: [id number] DOB: 1966-02-25  Gabrielle Hawkins is a 55 y.o. year old female who is a primary care patient of Gabrielle Hawkins, [place].  The Care Management team was consulted for assistance with chronic disease management and care coordination needs.  [name] was given information about Care Management services today including:  Care Management services include personalized support from designated clinical staff supervised by her physician, including individualized plan of care and coordination with other care providers 24/7 contact phone numbers for assistance for urgent and routine care needs. The patient may stop care management services at any time (effective at the end of the month) by phone call to the office staff.  Patient agreed to services and consent obtained.   Engaged with patient by telephone for follow up visit in response to provider referral for social work chronic care management and care coordination services.  Assessment: Review of patient past medical history, allergies, medications, and health status, including review of pertinent consultant reports was performed as part of comprehensive evaluation and provision of care management/care coordination services.   SDOH (Social Determinants of Health) assessments and interventions performed:  No    Advanced Directives Status: Not addressed in this encounter.  Care Plan  Allergies  Allergen Reactions   Humira [Adalimumab] Anaphylaxis   Ampicillin Hives and Itching   Codeine    Compazine [Prochlorperazine Maleate] Other (See Comments)    "Stroke-like symptoms" Can tolerate Phenergan.   Cymbalta [Duloxetine Hcl] Other (See Comments)    Fainting   Imitrex [Sumatriptan Base] Hives and Nausea And Vomiting   Lyrica [Pregabalin] Other (See Comments)    Fainting   Metoclopramide Hcl Hives   Percocet [Oxycodone-Acetaminophen] Itching   Effexor [Venlafaxine  Hydrochloride] Hives and Palpitations   Tape Itching and Rash    Outpatient Encounter Medications as of 05/21/2021  Medication Sig Note   albuterol (VENTOLIN HFA) 108 (90 Base) MCG/ACT inhaler Inhale 2 puffs into the lungs every 6 (six) hours as needed for wheezing or shortness of breath.    amitriptyline (ELAVIL) 25 MG tablet Take 25 mg by mouth at bedtime.     busPIRone (BUSPAR) 5 MG tablet Take 1 tablet (5 mg total) by mouth 2 (two) times daily.    cetirizine (ZYRTEC) 10 MG tablet Take 10 mg by mouth daily.    dicyclomine (BENTYL) 20 MG tablet Take 20 mg by mouth 4 (four) times daily -  before meals and at bedtime. Up to four times daily as needed 07/09/2020: Patient is taking 1-2 times daily    eszopiclone (LUNESTA) 2 MG TABS tablet TAKE ONE TABLET by MOUTH AT bedtime as needed FOR SLEEP. Take immediately BEFORE bedtime    folic acid (FOLVITE) 1 MG tablet Take 3 mg by mouth daily.     gabapentin (NEURONTIN) 300 MG capsule Take 2 capsules (600 mg total) by mouth 3 (three) times daily.    loperamide (IMODIUM) 2 MG capsule Take 1 capsule (2 mg total) by mouth 4 (four) times daily as needed for diarrhea or loose stools.    mesalamine (APRISO) 0.375 g 24 hr capsule Take 375 mg by mouth daily. 4 caps In the morning    methotrexate (50 MG/ML) 1 g injection Inject into the vein every Monday. 0.73m every Monday    olmesartan (BENICAR) 20 MG tablet Take 1 tablet (20 mg total) by mouth daily.    omeprazole (PRILOSEC) 40 MG capsule Take 1 capsule (40 mg total)  by mouth daily.    ondansetron (ZOFRAN) 4 MG tablet Take 4 mg by mouth every 6 (six) hours as needed for nausea or vomiting.    tiZANidine (ZANAFLEX) 2 MG tablet Take 2 mg by mouth 3 (three) times daily. 07/09/2020: Patient is taking once daily at bedtime    Vitamin D, Ergocalciferol, (DRISDOL) 1.25 MG (50000 UNIT) CAPS capsule Take 1 capsule (50,000 Units total) by mouth 2 (two) times a week.    [DISCONTINUED] inFLIXimab (REMICADE) 100 MG injection  Inject 100 mg into the vein every 30 (thirty) days. Infuse by intravenous route every 4 weeks  Patient is receiving Inflectra brand verses Remicade brand due to insurance (Patient not taking: Reported on 04/10/2020)    [DISCONTINUED] temazepam (RESTORIL) 7.5 MG capsule Take 1 capsule (7.5 mg total) by mouth at bedtime as needed for sleep. (Patient not taking: Reported on 07/09/2020)    No facility-administered encounter medications on file as of 05/21/2021.    Patient Active Problem List   Diagnosis Date Noted   Right-sided low back pain with right-sided sciatica 05/06/2019   Right sided numbness 03/27/2019   Paresthesia 12/25/2018   Essential hypertension 06/26/2018   Abdominal pain, chronic, right lower quadrant 01/06/2015   Nausea and vomiting 01/05/2015   Ulcerative colitis ([place]) 01/05/2015   Enteritis    Nausea with vomiting    Intractable nausea and vomiting 04/29/2014   Benign paroxysmal positional vertigo 03/14/2013   Lymphedema of arm 12/30/2011    Conditions to be addressed/monitored:  HTN, Ulcerative Colitis, and Crohns Disease  Care Plan : Social Work Care Plan  Updates made by [name] since 05/21/2021 12:00 AM     Problem: Care Coordination      Long-Range Goal: Collaborate with RN Care Manager to perform appropriate assessments to assist with care coordination needs   Start Date: 08/20/2020  Expected End Date: 06/09/2021  This Visit's Progress: On track  Recent Progress: On track  Priority: Low  Note:   Current Barriers:  Financial constraints related to recent loss of Disability benefit Chronic conditions including HTN and Crohn's Disease which put patient at increased risk of hospitalization  Social Work Clinical Goal(s):   patient will work with SW to address concerns related to care coordination  Interventions: 1:1 collaboration with Gabrielle Hawkins, [place] regarding development and update of comprehensive plan of care as evidenced by provider attestation  and co-signature Inter-disciplinary care team collaboration (see longitudinal plan of care) Successful outbound call placed to the patient to assess care coordination needs Determined the patient quit her job on August 30th and is en route to an interview Patient reports she prefers to work from home in order to cut down on commute time but has been unsuccessful locating a remote position Discussed patient is currently receiving food stamp assistance at $20 per month Encouraged the patient to contact her case worker if she is out of work for a prolonged period in order to have amount increased Discussed the patient continues to receive infusion every other month Performed chart review to note upcomming appointment scheduled with Consulting civil engineer in November - advised the patient to contact [place] sooner if needed Scheduled follow up call to the patient over the next month  Patient Goals/Self-Care Activities  patient will:   - Patient will call provider office for new concerns or questions Contact SW as needed prior to next scheduled call   Follow up Plan: SW will follow up with patient by phone over the next  month       Follow Up Plan: SW will follow up with patient by phone over the next month      [name], BSW, CDP Social Worker, Certified Dementia Practitioner [place] [facility] [place] [facility] [phone]

## 2021-05-21 NOTE — Patient Instructions (Signed)
Visit Information   Goals Addressed             This Visit's Progress    Work with SW to manage care coordination needs       Timeframe:  Long-Range Goal Priority:  Low Start Date:   12.16.21                          Expected End Date:  10.5.22                    Next outreach planned for: 10.10..22  Patient Goals/Self-Care Activities  patient will:   - Patient will call provider office for new concerns or questions Contact SW as needed prior to next scheduled call        Patient verbalizes understanding of instructions provided today and agrees to view in [place].   SW will follow up with the patient over the next month  [name], BSW, CDP Social Worker, Certified Dementia Practitioner [place] [facility] [place] [facility] [phone]

## 2021-05-27 ENCOUNTER — Telehealth: Payer: Self-pay

## 2021-05-27 NOTE — Chronic Care Management (AMB) (Signed)
Chronic Care Management Pharmacy Assistant   Name: Gabrielle Hawkins  MRN: [id number] DOB: 1965-12-08   Reason for Encounter: Disease State/ Hypertension   Recent office visits:  02-09-2021 [name] (CCM)  04-07-2021 Lynne Logan, RN (CCM)  04-21-2021 [name], [place]. Prevnar and shingrix given. STOP Qvar inhaler, Celebrex, Benadryl, Macrobid, Prednisone and tramadol.  05-21-2021 [name] (CCM)  Recent consult visits:  12-15-2020 Gavin Pound, MD (Rheumatology). Unable to view encounter  12-24-2020 [name], MD (Otolaryngology). Plan to schedule for fine needle aspirate of right thyroid nodule.   01-05-2021 [name], MD (Otolaryngology). Unable to view encounter   01-12-2021 [name], MD (Otolaryngology). START omeprazole 40 mg daily.   02-02-2021 Gavin Pound, MD (Rheumatology). Unable to view encounter  03-02-2021 Gavin Pound, MD (Rheumatology). Unable to view encounter  03-24-2021 Gavin Pound, MD (Rheumatology). Unable to view encounter  04-27-2021 Gavin Pound, MD (Rheumatology). Unable to view encounter  Hospital visits:  Medication Reconciliation was completed by comparing discharge summary, patient's EMR and Pharmacy list, and upon discussion with patient.   Admitted to the hospital on 01-14-2021 due to Ulcerative colitis with complication. Discharge date was 01-14-2021. Discharged from [place]?Medications Started at [facility] Discharge:?? -started Prednisone 10 mg due to Ulcerative colitis    Medication Changes at Hospital Discharge: None   Medications Discontinued at Hospital Discharge: None   Medications that remain the same after Hospital Discharge:??  -All other medications will remain the same.      Medications: Outpatient Encounter Medications as of 05/27/2021  Medication Sig Note   albuterol (VENTOLIN HFA) 108 (90 Base) MCG/ACT inhaler Inhale 2 puffs  into the lungs every 6 (six) hours as needed for wheezing or shortness of breath.    amitriptyline (ELAVIL) 25 MG tablet Take 25 mg by mouth at bedtime.     busPIRone (BUSPAR) 5 MG tablet Take 1 tablet (5 mg total) by mouth 2 (two) times daily.    cetirizine (ZYRTEC) 10 MG tablet Take 10 mg by mouth daily.    dicyclomine (BENTYL) 20 MG tablet Take 20 mg by mouth 4 (four) times daily -  before meals and at bedtime. Up to four times daily as needed 07/09/2020: Patient is taking 1-2 times daily    eszopiclone (LUNESTA) 2 MG TABS tablet TAKE ONE TABLET by MOUTH AT bedtime as needed FOR SLEEP. Take immediately BEFORE bedtime    folic acid (FOLVITE) 1 MG tablet Take 3 mg by mouth daily.     gabapentin (NEURONTIN) 300 MG capsule Take 2 capsules (600 mg total) by mouth 3 (three) times daily.    loperamide (IMODIUM) 2 MG capsule Take 1 capsule (2 mg total) by mouth 4 (four) times daily as needed for diarrhea or loose stools.    mesalamine (APRISO) 0.375 g 24 hr capsule Take 375 mg by mouth daily. 4 caps In the morning    methotrexate (50 MG/ML) 1 g injection Inject into the vein every Monday. 0.21m every Monday    olmesartan (BENICAR) 20 MG tablet Take 1 tablet (20 mg total) by mouth daily.    omeprazole (PRILOSEC) 40 MG capsule Take 1 capsule (40 mg total) by mouth daily.    ondansetron (ZOFRAN) 4 MG tablet Take 4 mg by mouth every 6 (six) hours as needed for nausea or vomiting.    tiZANidine (ZANAFLEX) 2 MG tablet Take 2 mg by mouth 3 (three) times daily. 07/09/2020: Patient is taking once daily  at bedtime    Vitamin D, Ergocalciferol, (DRISDOL) 1.25 MG (50000 UNIT) CAPS capsule Take 1 capsule (50,000 Units total) by mouth 2 (two) times a week.    [DISCONTINUED] inFLIXimab (REMICADE) 100 MG injection Inject 100 mg into the vein every 30 (thirty) days. Infuse by intravenous route every 4 weeks  Patient is receiving Inflectra brand verses Remicade brand due to insurance (Patient not taking: Reported on  04/10/2020)    [DISCONTINUED] temazepam (RESTORIL) 7.5 MG capsule Take 1 capsule (7.5 mg total) by mouth at bedtime as needed for sleep. (Patient not taking: Reported on 07/09/2020)    No facility-administered encounter medications on file as of 05/27/2021.   Reviewed chart prior to disease state call. Spoke with patient regarding BP  Recent Office Vitals: BP Readings from Last 3 Encounters:  04/21/21 120/84  01/14/21 119/74  11/17/20 122/84   Pulse Readings from Last 3 Encounters:  04/21/21 85  01/14/21 70  11/17/20 99    Wt Readings from Last 3 Encounters:  04/21/21 191 lb 9.6 oz (86.9 kg)  01/14/21 180 lb (81.6 kg)  11/17/20 192 lb 3.2 oz (87.2 kg)     Kidney Function Lab Results  Component Value Date/Time   CREATININE 0.77 01/14/2021 01:05 PM   CREATININE 0.85 11/17/2020 03:25 PM   CREATININE 0.9 09/21/2012 11:40 AM   CREATININE 1.3 (H) 07/30/2012 03:52 PM   GFR 102.57 02/14/2013 02:49 PM   GFRNONAA >60 01/14/2021 01:05 PM   GFRAA 86 02/18/2020 01:27 PM    BMP Latest Ref Rng & Units 01/14/2021 11/17/2020 11/04/2020  Glucose 70 - 99 mg/dL 82 95 93  BUN 6 - 20 mg/dL 20 13 12   Creatinine 0.44 - 1.00 mg/dL 0.77 0.85 0.77  BUN/Creat Ratio 9 - 23 - 15 -  Sodium 135 - 145 mmol/L 138 141 137  Potassium 3.5 - 5.1 mmol/L 3.6 3.9 3.9  Chloride 98 - 111 mmol/L 103 103 105  CO2 22 - 32 mmol/L 29 24 25   Calcium 8.9 - 10.3 mg/dL 9.2 9.1 8.9     05-27-2021: 1st attempt left VM 05-31-2021: 2nd attempt left VM 06-01-2021: 3rd attempt left VM  Care Gaps: Mammogram overdue Covid booster overdue Flu vaccine overdue  Star Rating Drugs: Olmesartan 20 mg- Last filled 04-21-2021 30 DS Upstream  [place] Clinical Pharmacist Assistant [phone]

## 2021-06-14 ENCOUNTER — Ambulatory Visit: Payer: Medicare Other

## 2021-06-14 DIAGNOSIS — K5180 Other ulcerative colitis without complications: Secondary | ICD-10-CM

## 2021-06-14 DIAGNOSIS — I10 Essential (primary) hypertension: Secondary | ICD-10-CM

## 2021-06-14 NOTE — Patient Instructions (Signed)
Visit Information   Goals Addressed             This Visit's Progress    COMPLETED: Work with SW to manage care coordination needs       Timeframe:  Long-Range Goal Priority:  Low Start Date:   12.16.21                          Expected End Date:  10.5.22                    Next outreach planned for: 10.10..22  Patient Goals/Self-Care Activities  patient will:   - Patient will call provider office for new concerns or questions Contact SW as needed        Patient verbalizes understanding of instructions provided today and agrees to view in [place].   No SW follow up planned at this time. Please contact me as needed  [name], BSW, CDP Social Worker, Certified Dementia Practitioner [place] [facility] [phone]

## 2021-06-14 NOTE — Chronic Care Management (AMB) (Signed)
Social Work Note  06/14/2021 Name: Gabrielle Hawkins MRN: [id number] DOB: 10-May-1966  Gabrielle Hawkins is a 55 y.o. year old female who is a primary care patient of Gabrielle Hawkins, [place].  The Care Management team was consulted for assistance with chronic disease management and care coordination needs.  Ms. Gudgel was given information about Care Management services today including:  Care Management services include personalized support from designated clinical staff supervised by her physician, including individualized plan of care and coordination with other care providers 24/7 contact phone numbers for assistance for urgent and routine care needs. The patient may stop care management services at any time (effective at the end of the month) by phone call to the office staff.  Patient agreed to services and consent obtained.   Engaged with patient by telephone for follow up visit in response to provider referral for social work chronic care management and care coordination services.  Assessment: Review of patient past medical history, allergies, medications, and health status, including review of pertinent consultant reports was performed as part of comprehensive evaluation and provision of care management/care coordination services.   SDOH (Social Determinants of Health) assessments and interventions performed:  No    Advanced Directives Status: Not addressed in this encounter.  Care Plan  Allergies  Allergen Reactions   Humira [Adalimumab] Anaphylaxis   Ampicillin Hives and Itching   Codeine    Compazine [Prochlorperazine Maleate] Other (See Comments)    "Stroke-like symptoms" Can tolerate Phenergan.   Cymbalta [Duloxetine Hcl] Other (See Comments)    Fainting   Imitrex [Sumatriptan Base] Hives and Nausea And Vomiting   Lyrica [Pregabalin] Other (See Comments)    Fainting   Metoclopramide Hcl Hives   Percocet [Oxycodone-Acetaminophen] Itching   Effexor [Venlafaxine  Hydrochloride] Hives and Palpitations   Tape Itching and Rash    Outpatient Encounter Medications as of 06/14/2021  Medication Sig Note   albuterol (VENTOLIN HFA) 108 (90 Base) MCG/ACT inhaler Inhale 2 puffs into the lungs every 6 (six) hours as needed for wheezing or shortness of breath.    amitriptyline (ELAVIL) 25 MG tablet Take 25 mg by mouth at bedtime.     busPIRone (BUSPAR) 5 MG tablet Take 1 tablet (5 mg total) by mouth 2 (two) times daily.    cetirizine (ZYRTEC) 10 MG tablet Take 10 mg by mouth daily.    dicyclomine (BENTYL) 20 MG tablet Take 20 mg by mouth 4 (four) times daily -  before meals and at bedtime. Up to four times daily as needed 07/09/2020: Patient is taking 1-2 times daily    eszopiclone (LUNESTA) 2 MG TABS tablet TAKE ONE TABLET by MOUTH AT bedtime as needed FOR SLEEP. Take immediately BEFORE bedtime    folic acid (FOLVITE) 1 MG tablet Take 3 mg by mouth daily.     gabapentin (NEURONTIN) 300 MG capsule Take 2 capsules (600 mg total) by mouth 3 (three) times daily.    loperamide (IMODIUM) 2 MG capsule Take 1 capsule (2 mg total) by mouth 4 (four) times daily as needed for diarrhea or loose stools.    mesalamine (APRISO) 0.375 g 24 hr capsule Take 375 mg by mouth daily. 4 caps In the morning    methotrexate (50 MG/ML) 1 g injection Inject into the vein every Monday. 0.79m every Monday    olmesartan (BENICAR) 20 MG tablet Take 1 tablet (20 mg total) by mouth daily.    omeprazole (PRILOSEC) 40 MG capsule Take 1 capsule (40 mg total)  by mouth daily.    ondansetron (ZOFRAN) 4 MG tablet Take 4 mg by mouth every 6 (six) hours as needed for nausea or vomiting.    tiZANidine (ZANAFLEX) 2 MG tablet Take 2 mg by mouth 3 (three) times daily. 07/09/2020: Patient is taking once daily at bedtime    Vitamin D, Ergocalciferol, (DRISDOL) 1.25 MG (50000 UNIT) CAPS capsule Take 1 capsule (50,000 Units total) by mouth 2 (two) times a week.    [DISCONTINUED] inFLIXimab (REMICADE) 100 MG  injection Inject 100 mg into the vein every 30 (thirty) days. Infuse by intravenous route every 4 weeks  Patient is receiving Inflectra brand verses Remicade brand due to insurance (Patient not taking: Reported on 04/10/2020)    [DISCONTINUED] temazepam (RESTORIL) 7.5 MG capsule Take 1 capsule (7.5 mg total) by mouth at bedtime as needed for sleep. (Patient not taking: Reported on 07/09/2020)    No facility-administered encounter medications on file as of 06/14/2021.    Patient Active Problem List   Diagnosis Date Noted   Right-sided low back pain with right-sided sciatica 05/06/2019   Right sided numbness 03/27/2019   Paresthesia 12/25/2018   Essential hypertension 06/26/2018   Abdominal pain, chronic, right lower quadrant 01/06/2015   Nausea and vomiting 01/05/2015   Ulcerative colitis ([place]) 01/05/2015   Enteritis    Nausea with vomiting    Intractable nausea and vomiting 04/29/2014   Benign paroxysmal positional vertigo 03/14/2013   Lymphedema of arm 12/30/2011    Conditions to be addressed/monitored:  HTN, Ulcerative Colitis, Crohn's Disease  Care Plan : Social Work Care Plan  Updates made by [name] since 06/14/2021 12:00 AM  Completed 06/14/2021   Problem: Care Coordination Resolved 06/14/2021     Long-Range Goal: Collaborate with RN Care Manager to perform appropriate assessments to assist with care coordination needs Completed 06/14/2021  Start Date: 08/20/2020  Expected End Date: 06/09/2021  Recent Progress: On track  Priority: Low  Note:   Current Barriers:  Financial constraints related to recent loss of Disability benefit Chronic conditions including HTN and Crohn's Disease which put patient at increased risk of hospitalization  Social Work Clinical Goal(s):   patient will work with SW to address concerns related to care coordination  Interventions: 1:1 collaboration with Gabrielle Hawkins, [place] regarding development and update of comprehensive plan of care as  evidenced by provider attestation and co-signature Inter-disciplinary care team collaboration (see longitudinal plan of care) Successful outbound call placed to the patient to assess care coordination needs Discussed the patient has been offered a job with [employer] in [place] and will start 10.11.22 Congratulated the patient on her new job opportunity - patient will continue the ability to afford costs of living which was previously a major concern Discussed the patient will work daily from 8-5:30 pm  Encouraged the patient to contact SW as needed regarding resource needs Collaboration with Smitty Knudsen advising of SW plan to sign off with option to re-open as needed  Patient Goals/Self-Care Activities  patient will:   - Patient will call provider office for new concerns or questions Contact SW as needed         Follow Up Plan:  No SW follow up planned at this time. The patient will remain engaged with RN Care Manager      [name], BSW, CDP Social Worker, Certified Dementia Practitioner [place] [facility] [place] [facility] [phone]

## 2021-06-17 ENCOUNTER — Telehealth: Payer: Self-pay

## 2021-06-17 NOTE — Chronic Care Management (AMB) (Signed)
    Chronic Care Management Pharmacy Assistant   Name: Gabrielle Hawkins  MRN: [id number] DOB: 11-13-1965   Reason for Encounter: Follow up on medication coordination status    Medications: Outpatient Encounter Medications as of 06/17/2021  Medication Sig Note   albuterol (VENTOLIN HFA) 108 (90 Base) MCG/ACT inhaler Inhale 2 puffs into the lungs every 6 (six) hours as needed for wheezing or shortness of breath.    amitriptyline (ELAVIL) 25 MG tablet Take 25 mg by mouth at bedtime.     busPIRone (BUSPAR) 5 MG tablet Take 1 tablet (5 mg total) by mouth 2 (two) times daily.    cetirizine (ZYRTEC) 10 MG tablet Take 10 mg by mouth daily.    dicyclomine (BENTYL) 20 MG tablet Take 20 mg by mouth 4 (four) times daily -  before meals and at bedtime. Up to four times daily as needed 07/09/2020: Patient is taking 1-2 times daily    eszopiclone (LUNESTA) 2 MG TABS tablet TAKE ONE TABLET by MOUTH AT bedtime as needed FOR SLEEP. Take immediately BEFORE bedtime    folic acid (FOLVITE) 1 MG tablet Take 3 mg by mouth daily.     gabapentin (NEURONTIN) 300 MG capsule Take 2 capsules (600 mg total) by mouth 3 (three) times daily.    loperamide (IMODIUM) 2 MG capsule Take 1 capsule (2 mg total) by mouth 4 (four) times daily as needed for diarrhea or loose stools.    mesalamine (APRISO) 0.375 g 24 hr capsule Take 375 mg by mouth daily. 4 caps In the morning    methotrexate (50 MG/ML) 1 g injection Inject into the vein every Monday. 0.64m every Monday    olmesartan (BENICAR) 20 MG tablet Take 1 tablet (20 mg total) by mouth daily.    omeprazole (PRILOSEC) 40 MG capsule Take 1 capsule (40 mg total) by mouth daily.    ondansetron (ZOFRAN) 4 MG tablet Take 4 mg by mouth every 6 (six) hours as needed for nausea or vomiting.    tiZANidine (ZANAFLEX) 2 MG tablet Take 2 mg by mouth 3 (three) times daily. 07/09/2020: Patient is taking once daily at bedtime    Vitamin D, Ergocalciferol, (DRISDOL) 1.25 MG (50000 UNIT)  CAPS capsule Take 1 capsule (50,000 Units total) by mouth 2 (two) times a week.    [DISCONTINUED] inFLIXimab (REMICADE) 100 MG injection Inject 100 mg into the vein every 30 (thirty) days. Infuse by intravenous route every 4 weeks  Patient is receiving Inflectra brand verses Remicade brand due to insurance (Patient not taking: Reported on 04/10/2020)    [DISCONTINUED] temazepam (RESTORIL) 7.5 MG capsule Take 1 capsule (7.5 mg total) by mouth at bedtime as needed for sleep. (Patient not taking: Reported on 07/09/2020)    No facility-administered encounter medications on file as of 06/17/2021.   06-17-2021: 1st attempt left VM 06-21-2021: 2nd attempt left VM 06-22-2021: 3rd attempt left VM  Care Gaps: Mammogram overdue Covid booster overdue Flu vaccine overdue  Star Rating Drugs: Olmesartan 20 mg- Last filled 04-21-2021 30 DS Upstream  [place] [phone]

## 2021-07-08 ENCOUNTER — Telehealth: Payer: Medicare Other

## 2021-07-13 ENCOUNTER — Telehealth: Payer: Medicare Other

## 2021-07-13 ENCOUNTER — Ambulatory Visit: Payer: Self-pay

## 2021-07-13 DIAGNOSIS — E559 Vitamin D deficiency, unspecified: Secondary | ICD-10-CM

## 2021-07-13 DIAGNOSIS — K5180 Other ulcerative colitis without complications: Secondary | ICD-10-CM

## 2021-07-14 NOTE — Chronic Care Management (AMB) (Signed)
Care Management    RN Visit Note  07/13/2021 Name: Gabrielle Hawkins MRN: [id number] DOB: 07-Jan-1966  Subjective: Gabrielle Hawkins is a 55 y.o. year old female who is a primary care patient of Gabrielle Hawkins, [facility]. The care management team was consulted for assistance with disease management and care coordination needs.    Engaged with patient by telephone for follow up visit in response to provider referral for case management and/or care coordination services.   Consent to Services:   [name] was given information about Care Management services today including:  Care Management services includes personalized support from designated clinical staff supervised by her physician, including individualized plan of care and coordination with other care providers 24/7 contact phone numbers for assistance for urgent and routine care needs. The patient may stop case management services at any time by phone call to the office staff.  Patient agreed to services and consent obtained.   Assessment: Review of patient past medical history, allergies, medications, health status, including review of consultants reports, laboratory and other test data, was performed as part of comprehensive evaluation and provision of chronic care management services.   SDOH (Social Determinants of Health) assessments and interventions performed:  Yes, see care plan   Care Plan  Allergies  Allergen Reactions   Humira [Adalimumab] Anaphylaxis   Ampicillin Hives and Itching   Codeine    Compazine [Prochlorperazine Maleate] Other (See Comments)    "Stroke-like symptoms" Can tolerate Phenergan.   Cymbalta [Duloxetine Hcl] Other (See Comments)    Fainting   Imitrex [Sumatriptan Base] Hives and Nausea And Vomiting   Lyrica [Pregabalin] Other (See Comments)    Fainting   Metoclopramide Hcl Hives   Percocet [Oxycodone-Acetaminophen] Itching   Effexor [Venlafaxine Hydrochloride] Hives and Palpitations   Tape  Itching and Rash    Outpatient Encounter Medications as of 07/13/2021  Medication Sig Note   albuterol (VENTOLIN HFA) 108 (90 Base) MCG/ACT inhaler Inhale 2 puffs into the lungs every 6 (six) hours as needed for wheezing or shortness of breath.    amitriptyline (ELAVIL) 25 MG tablet Take 25 mg by mouth at bedtime.     busPIRone (BUSPAR) 5 MG tablet Take 1 tablet (5 mg total) by mouth 2 (two) times daily.    cetirizine (ZYRTEC) 10 MG tablet Take 10 mg by mouth daily.    dicyclomine (BENTYL) 20 MG tablet Take 20 mg by mouth 4 (four) times daily -  before meals and at bedtime. Up to four times daily as needed 07/09/2020: Patient is taking 1-2 times daily    eszopiclone (LUNESTA) 2 MG TABS tablet TAKE ONE TABLET by MOUTH AT bedtime as needed FOR SLEEP. Take immediately BEFORE bedtime    folic acid (FOLVITE) 1 MG tablet Take 3 mg by mouth daily.     gabapentin (NEURONTIN) 300 MG capsule Take 2 capsules (600 mg total) by mouth 3 (three) times daily.    loperamide (IMODIUM) 2 MG capsule Take 1 capsule (2 mg total) by mouth 4 (four) times daily as needed for diarrhea or loose stools.    mesalamine (APRISO) 0.375 g 24 hr capsule Take 375 mg by mouth daily. 4 caps In the morning    methotrexate (50 MG/ML) 1 g injection Inject into the vein every Monday. 0.57m every Monday    olmesartan (BENICAR) 20 MG tablet Take 1 tablet (20 mg total) by mouth daily.    omeprazole (PRILOSEC) 40 MG capsule Take 1 capsule (40 mg total) by mouth  daily.    ondansetron (ZOFRAN) 4 MG tablet Take 4 mg by mouth every 6 (six) hours as needed for nausea or vomiting.    tiZANidine (ZANAFLEX) 2 MG tablet Take 2 mg by mouth 3 (three) times daily. 07/09/2020: Patient is taking once daily at bedtime    Vitamin D, Ergocalciferol, (DRISDOL) 1.25 MG (50000 UNIT) CAPS capsule Take 1 capsule (50,000 Units total) by mouth 2 (two) times a week.    No facility-administered encounter medications on file as of 07/13/2021.    Patient Active  Problem List   Diagnosis Date Noted   Right-sided low back pain with right-sided sciatica 05/06/2019   Right sided numbness 03/27/2019   Paresthesia 12/25/2018   Essential hypertension 06/26/2018   Abdominal pain, chronic, right lower quadrant 01/06/2015   Nausea and vomiting 01/05/2015   Ulcerative colitis ([place]) 01/05/2015   Enteritis    Nausea with vomiting    Intractable nausea and vomiting 04/29/2014   Benign paroxysmal positional vertigo 03/14/2013   Lymphedema of arm 12/30/2011    Conditions to be addressed/monitored:  Ulcerative Colitis, Vitamin D deficiency   Care Plan : [place] of Care  Updates made by Gabrielle Logan, RN since 07/13/2021 12:00 AM     Problem: No plan established for management of chronic disease states (Ulcerative Colitis, Vitamin D deficiency)   Priority: High     Long-Range Goal: Development of plan of care for chronic disease management for Ulcerative Colitis, Vitamin D deficiency   Start Date: 07/13/2021  Expected End Date: 07/14/2023  This Visit's Progress: On track  Priority: High  Note:   Current Barriers:  Knowledge Deficits related to plan of care for management of Ulcerative Colitis, Vitamin D deficiency Chronic Disease Management support and education needs related to Ulcerative Colitis, Vitamin D deficiency Caregiver Stress   RNCM Clinical Goal(s):  Patient will verbalize basic understanding of  Ulcerative Colitis, Vitamin D deficiency disease process and self health management plan   take all medications exactly as prescribed and will call provider for medication related questions demonstrate Ongoing health management independence   continue to work with RN Care Manager to address care management and care coordination needs related to  Ulcerative Colitis, Vitamin D deficiency will demonstrate ongoing self health care management ability    through collaboration with RN Care manager, provider, and care team.    Interventions: 1:1 collaboration with primary care provider regarding development and update of comprehensive plan of care as evidenced by provider attestation and co-signature Inter-disciplinary care team collaboration (see longitudinal plan of care) Evaluation of current treatment plan related to  self management and patient's adherence to plan as established by provider  Goal on track:  Yes Evaluation of current treatment plan related to  Ulcerative Colitis , self-management and patient's adherence to plan as established by provider. Discussed plans with patient for ongoing care management follow up and provided patient with direct contact information for care management team Reviewed medications with patient and discussed indication, dosage and frequency of prescribed medications for treatment of UC; educated on potential SE such as increased risk for infection and when to call the doctor if needed; Reviewed scheduled/upcoming provider appointments including; Social Work referral for caregiver resources ; Discussed plans with patient for ongoing care management follow up and provided patient with direct contact information for care management team; Screening for signs and symptoms of depression related to chronic disease state;   Goal on track:  Yes Evaluation of current treatment plan related  to  Vitamin D deficiency , self-management and patient's adherence to plan as established by provider. Review of patient status, including review of consultant's reports, relevant laboratory and other test results, and medications completed. Reviewed medications with patient and discussed indication, dosage and frequency of prescribed Vitamin D and when retesting is recommending; Educated on importance to get at least 15 minutes of natural sunlight when possible, eat Vitamin D rich foods ; Discussed plans with patient for ongoing care management follow up and provided patient with direct contact  information for care management team;   Patient Goals/Self-Care Activities: Patient will self administer medications as prescribed Patient will attend all scheduled provider appointments Patient will call pharmacy for medication refills Patient will attend church or other social activities Patient will continue to perform ADL's independently Patient will continue to perform IADL's independently Patient will call provider office for new concerns or questions Patient will work with BSW to address care coordination needs and will continue to work with the clinical team to address health care and disease management related needs.    Follow Up Plan:  Telephone follow up appointment with care management team member scheduled for:  10/13/21     Plan: Telephone follow up appointment with care management team member scheduled for:  10/13/21  Barb Merino, RN, BSN, CCM Care Management Coordinator [facility]  Direct Phone: [phone]

## 2021-07-14 NOTE — Patient Instructions (Signed)
Visit Information   PATIENT GOALS/PLAN OF CARE:   Care Plan : RN Care Manager Plan of Care  Updates made by Lynne Logan, RN since 07/13/2021 12:00 AM     Problem: No plan established for management of chronic disease states (Ulcerative Colitis, Vitamin D deficiency)   Priority: High     Long-Range Goal: Development of plan of care for chronic disease management for Ulcerative Colitis, Vitamin D deficiency   Start Date: 07/13/2021  Expected End Date: 07/14/2023  This Visit's Progress: On track  Priority: High  Note:   Current Barriers:  Knowledge Deficits related to plan of care for management of Ulcerative Colitis, Vitamin D deficiency Chronic Disease Management support and education needs related to Ulcerative Colitis, Vitamin D deficiency Caregiver Stress   RNCM Clinical Goal(s):  Patient will verbalize basic understanding of  Ulcerative Colitis, Vitamin D deficiency disease process and self health management plan   take all medications exactly as prescribed and will call provider for medication related questions demonstrate Ongoing health management independence   continue to work with RN Care Manager to address care management and care coordination needs related to  Ulcerative Colitis, Vitamin D deficiency will demonstrate ongoing self health care management ability    through collaboration with RN Care manager, provider, and care team.   Interventions: 1:1 collaboration with primary care provider regarding development and update of comprehensive plan of care as evidenced by provider attestation and co-signature Inter-disciplinary care team collaboration (see longitudinal plan of care) Evaluation of current treatment plan related to  self management and patient's adherence to plan as established by provider  Goal on track:  Yes Evaluation of current treatment plan related to  Ulcerative Colitis , self-management and patient's adherence to plan as established by  provider. Discussed plans with patient for ongoing care management follow up and provided patient with direct contact information for care management team Reviewed medications with patient and discussed indication, dosage and frequency of prescribed medications for treatment of UC; educated on potential SE such as increased risk for infection and when to call the doctor if needed; Reviewed scheduled/upcoming provider appointments including; Social Work referral for caregiver resources ; Discussed plans with patient for ongoing care management follow up and provided patient with direct contact information for care management team; Screening for signs and symptoms of depression related to chronic disease state;   Goal on track:  Yes Evaluation of current treatment plan related to  Vitamin D deficiency , self-management and patient's adherence to plan as established by provider. Review of patient status, including review of consultant's reports, relevant laboratory and other test results, and medications completed. Reviewed medications with patient and discussed indication, dosage and frequency of prescribed Vitamin D and when retesting is recommending; Educated on importance to get at least 15 minutes of natural sunlight when possible, eat Vitamin D rich foods ; Discussed plans with patient for ongoing care management follow up and provided patient with direct contact information for care management team;   Patient Goals/Self-Care Activities: Patient will self administer medications as prescribed Patient will attend all scheduled provider appointments Patient will call pharmacy for medication refills Patient will attend church or other social activities Patient will continue to perform ADL's independently Patient will continue to perform IADL's independently Patient will call provider office for new concerns or questions Patient will work with BSW to address care coordination needs and will  continue to work with the clinical team to address health care and disease management related needs.  Follow Up Plan:  Telephone follow up appointment with care management team member scheduled for:  10/13/21      Consent to CCM Services: Ms. Turbin was given information about Chronic Care Management services including:  CCM service includes personalized support from designated clinical staff supervised by her physician, including individualized plan of care and coordination with other care providers 24/7 contact phone numbers for assistance for urgent and routine care needs. Service will only be billed when office clinical staff spend 20 minutes or more in a month to coordinate care. Only one practitioner may furnish and bill the service in a calendar month. The patient may stop CCM services at any time (effective at the end of the month) by phone call to the office staff. The patient will be responsible for cost sharing (co-pay) of up to 20% of the service fee (after annual deductible is met).  Patient agreed to services and verbal consent obtained.   The patient verbalized understanding of instructions, educational materials, and care plan provided today and declined offer to receive copy of patient instructions, educational materials, and care plan.   Telephone follow up appointment with care management team member scheduled for: 10/13/21  Barb Merino, RN, BSN, CCM Care Management Coordinator [facility]  Direct Phone: [phone]

## 2021-09-21 ENCOUNTER — Emergency Department (HOSPITAL_COMMUNITY): Payer: 59

## 2021-09-21 ENCOUNTER — Encounter (HOSPITAL_COMMUNITY): Payer: Self-pay | Admitting: *Deleted

## 2021-09-21 ENCOUNTER — Emergency Department (HOSPITAL_COMMUNITY)
Admission: EM | Admit: 2021-09-21 | Discharge: 2021-09-21 | Disposition: A | Discharge status: Home / Self Care | Payer: 59 | Attending: Emergency Medicine | Admitting: Emergency Medicine

## 2021-09-21 DIAGNOSIS — Z853 Personal history of malignant neoplasm of breast: Secondary | ICD-10-CM | POA: Diagnosis not present

## 2021-09-21 DIAGNOSIS — M79602 Pain in left arm: Secondary | ICD-10-CM | POA: Insufficient documentation

## 2021-09-21 DIAGNOSIS — M25512 Pain in left shoulder: Secondary | ICD-10-CM | POA: Insufficient documentation

## 2021-09-21 DIAGNOSIS — I10 Essential (primary) hypertension: Secondary | ICD-10-CM | POA: Diagnosis not present

## 2021-09-21 DIAGNOSIS — R079 Chest pain, unspecified: Secondary | ICD-10-CM | POA: Insufficient documentation

## 2021-09-21 LAB — BASIC METABOLIC PANEL
Anion gap: 7 (ref 5–15)
BUN: 13 mg/dL (ref 6–20)
CO2: 26 mmol/L (ref 22–32)
Calcium: 8.8 mg/dL — ABNORMAL LOW (ref 8.9–10.3)
Chloride: 103 mmol/L (ref 98–111)
Creatinine, Ser: 0.81 mg/dL (ref 0.44–1.00)
GFR, Estimated: >60 mL/min (ref >60)
Glucose, Bld: 87 mg/dL (ref 70–99)
Potassium: 4 mmol/L (ref 3.5–5.1)
Sodium: 136 mmol/L (ref 135–145)

## 2021-09-21 LAB — TROPONIN I (HIGH SENSITIVITY)
Troponin I (High Sensitivity): 3 ng/L (ref <18)
Troponin I (High Sensitivity): 3 ng/L (ref <18)

## 2021-09-21 LAB — CBC
HCT: 42.7 % (ref 36.0–46.0)
Hemoglobin: 13.7 g/dL (ref 12.0–15.0)
MCH: 29.8 pg (ref 26.0–34.0)
MCHC: 32.1 g/dL (ref 30.0–36.0)
MCV: 92.8 fL (ref 80.0–100.0)
Platelets: 185 10*3/uL (ref 150–400)
RBC: 4.6 MIL/uL (ref 3.87–5.11)
RDW: 13.2 % (ref 11.5–15.5)
WBC: 5.2 10*3/uL (ref 4.0–10.5)
nRBC: 0 % (ref 0.0–0.2)

## 2021-09-21 LAB — CBG MONITORING, ED
Glucose-Capillary: 57 mg/dL — ABNORMAL LOW (ref 70–99)
Glucose-Capillary: 56 mg/dL — ABNORMAL LOW (ref 70–99)
Glucose-Capillary: 63 mg/dL — ABNORMAL LOW (ref 70–99)
Glucose-Capillary: 58 mg/dL — ABNORMAL LOW (ref 70–99)
Glucose-Capillary: 64 mg/dL — ABNORMAL LOW (ref 70–99)
Glucose-Capillary: 83 mg/dL (ref 70–99)

## 2021-09-21 MED ORDER — DEXTROSE 5 % AND 0.45 % NACL IV BOLUS
500.0000 mL | Freq: Once | INTRAVENOUS | Status: AC
  Administered 2021-09-21: 500 mL via INTRAVENOUS

## 2021-09-21 MED ORDER — DIAZEPAM 5 MG/ML IJ SOLN
2.5000 mg | Freq: Once | INTRAMUSCULAR | Status: AC
Start: 2021-09-21 — End: 2021-09-21
  Administered 2021-09-21: 2.5 mg via INTRAVENOUS
  Filled 2021-09-21: qty 2

## 2021-09-21 MED ORDER — IOHEXOL 350 MG/ML SOLN
100.0000 mL | Freq: Once | INTRAVENOUS | Status: AC | PRN
  Administered 2021-09-21: 80 mL via INTRAVENOUS

## 2021-09-21 MED ORDER — DEXTROSE-NACL 5-0.9 % IV SOLN: INTRAVENOUS | Status: AC

## 2021-09-21 MED ORDER — ASPIRIN 81 MG PO CHEW
324.0000 mg | CHEWABLE_TABLET | Freq: Once | ORAL | Status: AC
  Administered 2021-09-21: 324 mg via ORAL
  Filled 2021-09-21: qty 4

## 2021-09-21 NOTE — ED Notes (Addendum)
Upon assessment pt ambulated to restroom and upon ambulating back to room pt became dizzy and SOB. PA aware. O2 sats 100%, HR 98, BP 124/78. CBG 58.

## 2021-09-21 NOTE — ED Notes (Signed)
Pt given apple juice at this time for CBG. PA aware.

## 2021-09-21 NOTE — ED Triage Notes (Signed)
Chest pain for 4 days

## 2021-09-21 NOTE — ED Notes (Signed)
Pt transported to CT ?

## 2021-09-21 NOTE — ED Provider Notes (Signed)
Pt's care assumed at [facility] angio chest pending and repeat cbg pending.   Ct chest  no pe.   Pt counseled on hypoglycemia.  Pt has no risk.  Pt denies insulin use.  No alcohol abuse.  Pt admits to sometimes eating poorly but no other risk  Pt advised to follow up with her primary care MD for recheck.    Sidney Ace 09/21/21 2113    Godfrey Pick, MD 09/23/21 1950

## 2021-09-21 NOTE — ED Provider Notes (Signed)
[facility] EMERGENCY DEPARTMENT Provider Note   CSN: [id number] Arrival date & time: 09/21/21  1323     History  Chief Complaint  Patient presents with   Chest Pain    Gabrielle Hawkins is a 56 y.o. female presenting for evaluation of left arm pain, left chest pain which radiates into her left shoulder blade region and has been present for the past 4 days.  She describes a burning/tingling sensation which is predominantly in her left arm and is worsened with certain positions, particularly if she tries to lie on her left side.  She denies shortness of breath, palpitations, cough, fevers or chills, also no nausea or vomiting.  No lower extremity edema or calf pain.  She also denies any neck pain, stiffness or injury.  Past medical history is significant for distant history of breast cancer, also has ulcerative colitis, chronic right arm lymphedema, history of stroke.  She has taken Motrin, last dose yesterday evening with no improvement in her symptoms.   She does endorse increased personal stressors has recently had to take on the care of an aunt with Alzheimer's who is moved into her home.  The patient has had to quit her job in order to care for her aunt. She becomes tearful when she relates this information.  She denies SI/Hi.    The history is provided by the patient.   HPI: A 56 year old patient with a history of CVA, hypertension and obesity presents for evaluation of chest pain. Initial onset of pain was more than 6 hours ago. The patient's chest pain is sharp and is not worse with exertion. The patient's chest pain is middle- or left-sided, is not well-localized, is not described as heaviness/pressure/tightness and does not radiate to the arms/jaw/neck. The patient does not complain of nausea and denies diaphoresis. The patient has no history of peripheral artery disease, has not smoked in the past 90 days, denies any history of treated diabetes, has no relevant family history of  coronary artery disease (first degree relative at less than age 33) and has no history of hypercholesterolemia.   Home Medications Prior to Admission medications   Medication Sig Start Date End Date Taking? Authorizing Provider  albuterol (VENTOLIN HFA) 108 (90 Base) MCG/ACT inhaler Inhale 2 puffs into the lungs every 6 (six) hours as needed for wheezing or shortness of breath. 04/13/20  Yes [name], FNP  amitriptyline (ELAVIL) 25 MG tablet Take 25 mg by mouth at bedtime.  05/20/15  Yes [provider]  busPIRone (BUSPAR) 5 MG tablet Take 1 tablet (5 mg total) by mouth 2 (two) times daily. 04/21/21  Yes [name], FNP  cetirizine (ZYRTEC) 10 MG tablet Take 10 mg by mouth daily.   Yes [provider]  dicyclomine (BENTYL) 20 MG tablet Take 20 mg by mouth 4 (four) times daily -  before meals and at bedtime. Up to four times daily as needed   Yes [provider]  eszopiclone (LUNESTA) 2 MG TABS tablet TAKE ONE TABLET by MOUTH AT bedtime as needed FOR SLEEP. Take immediately BEFORE bedtime 04/21/21  Yes [name], FNP  folic acid (FOLVITE) 1 MG tablet Take 3 mg by mouth daily.    Yes [provider]  gabapentin (NEURONTIN) 300 MG capsule Take 2 capsules (600 mg total) by mouth 3 (three) times daily. 04/21/20  Yes Suzzanne Cloud, NP  golimumab (SIMPONI ARIA) 50 MG/4ML SOLN injection 50 mg See admin instructions.   Yes [provider]  loperamide (IMODIUM) 2 MG capsule Take 1 capsule (2 mg total) by mouth 4 (four) times daily as needed for diarrhea or loose stools. 04/13/20  Yes [name], FNP  mesalamine (APRISO) 0.375 g 24 hr capsule Take 375 mg by mouth See admin instructions. 2 capsules at bedtime   Yes [provider]  methotrexate (50 MG/ML) 1 g injection Inject into the vein every Monday. 0.56m every Monday   Yes [provider]  olmesartan (BENICAR) 20 MG tablet Take 1 tablet (20 mg total) by mouth daily. 04/21/21  Yes MMinette Brine FNP  omeprazole (PRILOSEC) 40 MG capsule Take 1 capsule (40 mg total) by mouth daily. 04/29/21  Yes NRozetta Nunnery MD  ondansetron (ZOFRAN) 4 MG tablet Take 4 mg by mouth every 6 (six) hours as needed for nausea or vomiting.   Yes [provider]  OVER THE COUNTER MEDICATION Aleve cream for pain   Yes [provider]  tiZANidine (ZANAFLEX) 2 MG tablet Take 2 mg by mouth 3 (three) times daily.   Yes [provider]  traMADol (ULTRAM) 50 MG tablet Take 50 mg by mouth at bedtime as needed for moderate pain. 04/27/21  Yes [provider]  Vitamin D, Ergocalciferol, (DRISDOL) 1.25 MG (50000 UNIT) CAPS capsule Take 1 capsule (50,000 Units total) by mouth 2 (two) times a week. Patient taking differently: Take 50,000 Units by mouth 2 (two) times a week. Monday and Wednesday 11/23/20  Yes MMinette Brine FNP  inFLIXimab (REMICADE) 100 MG injection Inject 100 mg into the vein every 30 (thirty) days. Infuse by intravenous route every 4 weeks  Patient is receiving Inflectra brand verses Remicade brand due to insurance Patient not taking: Reported on 04/10/2020  11/02/20  [provider]  temazepam (RESTORIL) 7.5 MG capsule Take 1 capsule (7.5 mg total) by mouth at bedtime as needed for sleep. Patient not taking: Reported on 07/09/2020 04/13/20 11/02/20  MMinette Brine FNP      Allergies    Humira [adalimumab], Ampicillin, Codeine, Compazine [prochlorperazine maleate], Cymbalta [duloxetine hcl], Imitrex [sumatriptan base], Lyrica [pregabalin], Metoclopramide hcl, Percocet [oxycodone-acetaminophen], Effexor [venlafaxine hydrochloride], and Tape    Review of Systems   Review of Systems  Constitutional:  Negative for chills and fever.  HENT:  Negative for congestion and sore throat.   Eyes: Negative.   Respiratory:  Negative for chest tightness and shortness of breath.   Cardiovascular:  Positive for chest pain. Negative for palpitations and leg swelling.   Gastrointestinal:  Negative for abdominal pain, nausea and vomiting.  Genitourinary: Negative.   Musculoskeletal:  Negative for arthralgias, joint swelling and neck pain.  Skin: Negative.  Negative for rash and wound.  Neurological:  Negative for dizziness, weakness, light-headedness, numbness and headaches.  Psychiatric/Behavioral: Negative.    All other systems reviewed and are negative.  Physical Exam Updated Vital Signs BP 137/79    Pulse 93    Temp 97.8 F (36.6 C)    Resp 16    SpO2 98%  Physical Exam Vitals and nursing note reviewed.  Constitutional:      Appearance: She is well-developed.  HENT:     Head: Normocephalic and atraumatic.  Eyes:     Conjunctiva/sclera: Conjunctivae normal.  Cardiovascular:     Rate and Rhythm: Normal rate and regular rhythm.     Heart sounds: Normal heart sounds.  Pulmonary:     Effort: Pulmonary effort is normal.     Breath sounds: Normal breath sounds. No wheezing.  Abdominal:  General: Bowel sounds are normal.     Palpations: Abdomen is soft.     Tenderness: There is no abdominal tenderness.  Musculoskeletal:        General: Normal range of motion.     Cervical back: Normal range of motion.  Skin:    General: Skin is warm and dry.  Neurological:     Mental Status: She is alert.    ED Results / Procedures / Treatments   Labs (all labs ordered are listed, but only abnormal results are displayed) Labs Reviewed  BASIC METABOLIC PANEL - Abnormal; Notable for the following components:      Result Value   Calcium 8.8 (*)    All other components within normal limits  CBG MONITORING, ED - Abnormal; Notable for the following components:   Glucose-Capillary 57 (*)    All other components within normal limits  CBG MONITORING, ED - Abnormal; Notable for the following components:   Glucose-Capillary 56 (*)    All other components within normal limits  CBG MONITORING, ED - Abnormal; Notable for the following components:    Glucose-Capillary 63 (*)    All other components within normal limits  CBG MONITORING, ED - Abnormal; Notable for the following components:   Glucose-Capillary 58 (*)    All other components within normal limits  CBC  CBG MONITORING, ED  TROPONIN I (HIGH SENSITIVITY)  TROPONIN I (HIGH SENSITIVITY)    EKG EKG Interpretation  Date/Time:  Tuesday September 21 2021 13:44:09 EST Ventricular Rate:  72 PR Interval:  153 QRS Duration: 94 QT Interval:  412 QTC Calculation: 451 R Axis:   -9 Text Interpretation: Sinus rhythm LVH by voltage Anterior Q waves, possibly due to LVH since last tracing no significant change Confirmed by Daleen Bo [phone]) on 09/21/2021 3:57:23 PM  Radiology DG Chest Portable 1 View  Result Date: 09/21/2021 CLINICAL DATA:  Chest pain for 4 days EXAM: PORTABLE CHEST 1 VIEW COMPARISON:  None. FINDINGS: Normal mediastinum and cardiac silhouette. Normal pulmonary vasculature. No evidence of effusion, infiltrate, or pneumothorax. No acute bony abnormality. IMPRESSION: No acute cardiopulmonary process. Electronically Signed   By: Suzy Bouchard M.D.   On: 09/21/2021 15:14    Procedures Procedures    Medications Ordered in ED Medications  dextrose 5 %-0.9 % sodium chloride infusion ( Intravenous New Bag/Given 09/21/21 1838)  aspirin chewable tablet 324 mg (324 mg Oral Given 09/21/21 1521)  diazepam (VALIUM) injection 2.5 mg (2.5 mg Intravenous Given 09/21/21 1546)  dextrose 5 % and 0.45% NaCl 5-0.45 % bolus 500 mL (0 mLs Intravenous Stopped 09/21/21 1727)  iohexol (OMNIPAQUE) 350 MG/ML injection 100 mL (80 mLs Intravenous Contrast Given 09/21/21 1908)    ED Course/ Medical Decision Making/ A&P Clinical Course as of 09/21/21 1924  Tue Sep 21, 2021  1836 Pt with a 4-day history of left chest, arm and shoulder pain.  Her EKG is normal sinus rhythm, her initial troponin is negative.  She has had multiple episodes of hypoglycemia despite initial treatment with oral  intake, followed by D5 half-normal saline 500 cc bolus.  At recheck of her CBG it is now currently 19, additional D5 has been ordered.  She was also given a glass of orange juice and a snack.  She was just ambulated to the bathroom at which time she became very tachypneic and short of breath with a respiratory rate to 30 but her pulse ox remained normal.  [JI]    Clinical Course User Index [JI] Trenia Tennyson,  Ginette Otto   HEAR Score: 4                       Medical Decision Making Amount and/or Complexity of Data Reviewed Labs: ordered. Radiology: ordered.  Risk OTC drugs. Prescription drug management.   Pending Ct angio.  Pt with atypical chest pain, persistent hypoglycemia despite oral juices and snacks and IV D5.    Discussed with Alyse Low PA-C who assumes care.  Heart score 4.          Final Clinical Impression(s) / ED Diagnoses Final diagnoses:  None    Rx / DC Orders ED Discharge Orders     None         Landis Martins 09/21/21 1924    Daleen Bo, MD 09/22/21 [phone]

## 2021-09-21 NOTE — ED Notes (Signed)
Pt given orange juice at this time.

## 2021-09-21 NOTE — ED Notes (Signed)
Pt CBG 57. Juice given at this time.

## 2021-09-24 ENCOUNTER — Telehealth: Payer: Self-pay

## 2021-09-24 ENCOUNTER — Ambulatory Visit: Payer: Self-pay

## 2021-09-24 DIAGNOSIS — E559 Vitamin D deficiency, unspecified: Secondary | ICD-10-CM

## 2021-09-24 DIAGNOSIS — I10 Essential (primary) hypertension: Secondary | ICD-10-CM

## 2021-09-24 DIAGNOSIS — K5180 Other ulcerative colitis without complications: Secondary | ICD-10-CM

## 2021-09-24 NOTE — Chronic Care Management (AMB) (Signed)
Social Work Note  09/24/2021 Name: Gabrielle Hawkins MRN: [id number] DOB: Aug 23, 1966  Gabrielle Hawkins is a 56 y.o. year old female who is a primary care patient of Glendale Chard, MD.  The Care Management team was consulted for assistance with chronic disease management and care coordination needs.  Ms. Gathright was given information about Care Management services today including:  Care Management services include personalized support from designated clinical staff supervised by her physician, including individualized plan of care and coordination with other care providers 24/7 contact phone numbers for assistance for urgent and routine care needs. The patient may stop care management services at any time (effective at the end of the month) by phone call to the office staff.  Patient agreed to services and consent obtained.   Engaged with patient by telephone for follow up visit in response to provider referral for social work chronic care management and care coordination services.  Assessment: Review of patient past medical history, allergies, medications, and health status, including review of pertinent consultant reports was performed as part of comprehensive evaluation and provision of care management/care coordination services.   SDOH (Social Determinants of Health) assessments and interventions performed:  No    Advanced Directives Status: Not addressed in this encounter.  Care Plan  Allergies  Allergen Reactions   Humira [Adalimumab] Anaphylaxis   Ampicillin Hives and Itching   Codeine    Compazine [Prochlorperazine Maleate] Other (See Comments)    "Stroke-like symptoms" Can tolerate Phenergan.   Cymbalta [Duloxetine Hcl] Other (See Comments)    Fainting   Imitrex [Sumatriptan Base] Hives and Nausea And Vomiting   Lyrica [Pregabalin] Other (See Comments)    Fainting   Metoclopramide Hcl Hives   Percocet [Oxycodone-Acetaminophen] Itching   Effexor [Venlafaxine  Hydrochloride] Hives and Palpitations   Tape Itching and Rash    Outpatient Encounter Medications as of 09/24/2021  Medication Sig Note   albuterol (VENTOLIN HFA) 108 (90 Base) MCG/ACT inhaler Inhale 2 puffs into the lungs every 6 (six) hours as needed for wheezing or shortness of breath.    amitriptyline (ELAVIL) 25 MG tablet Take 25 mg by mouth at bedtime.     busPIRone (BUSPAR) 5 MG tablet Take 1 tablet (5 mg total) by mouth 2 (two) times daily.    cetirizine (ZYRTEC) 10 MG tablet Take 10 mg by mouth daily.    dicyclomine (BENTYL) 20 MG tablet Take 20 mg by mouth 4 (four) times daily -  before meals and at bedtime. Up to four times daily as needed 07/09/2020: Patient is taking 1-2 times daily    eszopiclone (LUNESTA) 2 MG TABS tablet TAKE ONE TABLET by MOUTH AT bedtime as needed FOR SLEEP. Take immediately BEFORE bedtime    folic acid (FOLVITE) 1 MG tablet Take 3 mg by mouth daily.     gabapentin (NEURONTIN) 300 MG capsule Take 2 capsules (600 mg total) by mouth 3 (three) times daily.    golimumab (SIMPONI ARIA) 50 MG/4ML SOLN injection 50 mg See admin instructions.    loperamide (IMODIUM) 2 MG capsule Take 1 capsule (2 mg total) by mouth 4 (four) times daily as needed for diarrhea or loose stools.    mesalamine (APRISO) 0.375 g 24 hr capsule Take 375 mg by mouth See admin instructions. 2 capsules at bedtime    methotrexate (50 MG/ML) 1 g injection Inject into the vein every Monday. 0.35m every Monday    olmesartan (BENICAR) 20 MG tablet Take 1 tablet (20 mg total) by  mouth daily.    omeprazole (PRILOSEC) 40 MG capsule Take 1 capsule (40 mg total) by mouth daily.    ondansetron (ZOFRAN) 4 MG tablet Take 4 mg by mouth every 6 (six) hours as needed for nausea or vomiting.    OVER THE COUNTER MEDICATION Aleve cream for pain    tiZANidine (ZANAFLEX) 2 MG tablet Take 2 mg by mouth 3 (three) times daily. 07/09/2020: Patient is taking once daily at bedtime    traMADol (ULTRAM) 50 MG tablet Take 50 mg  by mouth at bedtime as needed for moderate pain.    Vitamin D, Ergocalciferol, (DRISDOL) 1.25 MG (50000 UNIT) CAPS capsule Take 1 capsule (50,000 Units total) by mouth 2 (two) times a week. (Patient taking differently: Take 50,000 Units by mouth 2 (two) times a week. Monday and Wednesday)    [DISCONTINUED] inFLIXimab (REMICADE) 100 MG injection Inject 100 mg into the vein every 30 (thirty) days. Infuse by intravenous route every 4 weeks  Patient is receiving Inflectra brand verses Remicade brand due to insurance (Patient not taking: Reported on 04/10/2020)    [DISCONTINUED] temazepam (RESTORIL) 7.5 MG capsule Take 1 capsule (7.5 mg total) by mouth at bedtime as needed for sleep. (Patient not taking: Reported on 07/09/2020)    No facility-administered encounter medications on file as of 09/24/2021.    Patient Active Problem List   Diagnosis Date Noted   Right-sided low back pain with right-sided sciatica 05/06/2019   Right sided numbness 03/27/2019   Paresthesia 12/25/2018   Essential hypertension 06/26/2018   Abdominal pain, chronic, right lower quadrant 01/06/2015   Nausea and vomiting 01/05/2015   Ulcerative colitis ([place]) 01/05/2015   Enteritis    Nausea with vomiting    Intractable nausea and vomiting 04/29/2014   Benign paroxysmal positional vertigo 03/14/2013   Lymphedema of arm 12/30/2011    Conditions to be addressed/monitored:  HTN, Ulcerative Colitis, Vitamin D Deficiency ;  Caregiver Stress  Care Plan : Social Work Plan of Care  Updates made by [name] since 09/24/2021 12:00 AM     Problem: Caregiver Stress      Long-Range Goal: Caregiver Coping Optimized   Start Date: 09/24/2021  Priority: High  Note:   Current Barriers:  Chronic disease management support and education needs related to  HTN, Ulcerative Colitis, Vitamin D Deficiency   Caregiver Stress related to caring for relative with Alzheimer's Disease  Social Worker Clinical Goal(s):  patient will work with  SW to identify and address any acute and/or chronic care coordination needs related to the self health management of  HTN, Ulcerative Colitis, Vitamin D Deficiency   patient will work with SW to address concerns related to caregiver stress SW Interventions:  Inter-disciplinary care team collaboration (see longitudinal plan of care) Collaboration with [name] FNP regarding development and update of comprehensive plan of care as evidenced by provider attestation and co-signature Telephonic visit completed with the patient who reports she was recently seen in the ED on 09/21/21 due to chest pain Discussed this chest pain was most likely caused by stress Patient reports she is not sleeping well and has been very stressed out trying to care for her aunt in the home Discussed the patient has been working with the [place] and [facility] program to obtain caregiver assistance in the home Advised patient SW would communicate concerns to the patients primary care provider to request an office visit Collaboration with [name] FNP to advise of patients concern with stress and sleep disturbance; Mrs.  Laurance Flatten reports she will have a team member contact the patient to schedule an appointment Collaboration with [place] to advise of interventions and plan  Patient Goals/Self-Care Activities patient will:   -  Follow up with primary care provider to address sleep disturbance and stress level  Follow Up Plan: The care management team will reach out to the patient again over the next 21 days.        Follow Up Plan: SW will follow up with patient by phone over the next 21 days      [name], BSW, CDP Social Worker, Certified Dementia Practitioner [place] [facility] [place] [facility] [phone]

## 2021-09-24 NOTE — Patient Instructions (Signed)
Visit Information  Thank you for taking time to visit with me today. Please don't hesitate to contact me if I can be of assistance to you before our next scheduled telephone appointment.  Following are the goals we discussed today:  Patient Goals/Self-Care Activities patient will:   - Follow up with primary care provider to address sleep disturbance and stress level  Our next appointment is by telephone on 10/06/21  Please call the care guide team at [phone] if you need to cancel or reschedule your appointment.   If you are experiencing a Mental Health or [place] or need someone to talk to, please call the Suicide and Crisis Lifeline: 988   Patient verbalizes understanding of instructions and care plan provided today and agrees to view in Ages. Active MyChart status confirmed with patient.    The care management team will reach out to the patient again over the next 21 days.   [name], BSW, CDP Social Worker, Certified Dementia Practitioner [facility] [place] [facility] [place] [facility] [phone]

## 2021-09-24 NOTE — Telephone Encounter (Signed)
Spoke with pt after visiting ED on 09/21/2021, she states doing " a little better" . She has an apt scheduled with [place] on 09/27/2021. Apt confirmed.

## 2021-09-27 ENCOUNTER — Encounter: Payer: Self-pay | Admitting: Nurse Practitioner

## 2021-09-27 ENCOUNTER — Other Ambulatory Visit: Payer: Self-pay

## 2021-09-27 ENCOUNTER — Ambulatory Visit (INDEPENDENT_AMBULATORY_CARE_PROVIDER_SITE_OTHER): Payer: 59 | Admitting: Nurse Practitioner

## 2021-09-27 ENCOUNTER — Other Ambulatory Visit: Payer: Self-pay | Admitting: Neurology

## 2021-09-27 VITALS — BP 128/70 | HR 91 | Temp 98.2°F | Ht 68.0 in | Wt 187.4 lb

## 2021-09-27 DIAGNOSIS — G4700 Insomnia, unspecified: Secondary | ICD-10-CM

## 2021-09-27 DIAGNOSIS — R9431 Abnormal electrocardiogram [ECG] [EKG]: Secondary | ICD-10-CM

## 2021-09-27 DIAGNOSIS — I10 Essential (primary) hypertension: Secondary | ICD-10-CM

## 2021-09-27 DIAGNOSIS — F419 Anxiety disorder, unspecified: Secondary | ICD-10-CM

## 2021-09-27 DIAGNOSIS — F3289 Other specified depressive episodes: Secondary | ICD-10-CM

## 2021-09-27 MED ORDER — ESZOPICLONE 2 MG PO TABS: ORAL_TABLET | ORAL | 5 refills | Status: DC

## 2021-09-27 MED ORDER — BUSPIRONE HCL 5 MG PO TABS: 5.0000 mg | ORAL_TABLET | Freq: Two times a day (BID) | ORAL | 1 refills | Status: DC

## 2021-09-27 MED ORDER — AMITRIPTYLINE HCL 50 MG PO TABS: 50.0000 mg | ORAL_TABLET | Freq: Every day | ORAL | 1 refills | Status: DC

## 2021-09-27 NOTE — Progress Notes (Signed)
I,[name],acting as a Education administrator for [employer], FNP.,have documented all relevant documentation on the behalf of [name], FNP,as directed by  [name], FNP while in the presence of [name], [place].  This visit occurred during the SARS-CoV-2 public health emergency.  Safety protocols were in place, including screening questions prior to the visit, additional usage of staff PPE, and extensive cleaning of exam room while observing appropriate contact time as indicated for disinfecting solutions.  Subjective:     Patient ID: NARY SNEED , female    DOB: 12-07-1965 , 56 y.o.   MRN: [id number]   Chief Complaint  Patient presents with   Follow-up    HPI  The patient is here for a blood pressure and insomnia f/u.  She was seen in the ER on 09/21/2021 for left chest pain radiating around to left shoulder blade and left arm tingling from the shoulder down. She was given D5 and snacks due to her glucose being 57.  Mostly now in the shoulder pain. She has not taken any tylenol for the pain. She feels this is related to stress. She has signed her up for long term medicaid. She is unable to leave her at home alone.    Hypertension This is a chronic problem. The current episode started more than 1 year ago. The problem is unchanged. The problem is controlled. Pertinent negatives include no anxiety, chest pain, headaches, palpitations or shortness of breath. There are no associated agents to hypertension. Risk factors for coronary artery disease include obesity and sedentary lifestyle. Past treatments include alpha 1 blockers. There are no compliance problems.  There is no history of angina. There is no history of chronic renal disease.  Insomnia Primary symptoms: no sleep disturbance, frequent awakening.   The onset quality is sudden. The problem has been gradually worsening since onset. PMH includes: associated symptoms present.     Past Medical History:  Diagnosis Date   Anemia  1998    Anxiety    Arthritis    big toe    Asthma    BRCA1 negative    BRCA2 negative    Breast cancer ([place]) 2006   chem/radiation/2years tamoxifen   Chronic neck and back pain    Crohn's disease ([place])    Depression    Endometriosis    Fibroid    GERD (gastroesophageal reflux disease)    History of chemotherapy 01/2005   Hx of tamoxifen therapy    Hx of transfusion of packed red blood cells    IBS (irritable bowel syndrome)    Kidney stones    Lymphedema of arm    Migraines    just at dx of breast CA   Mucinous cystadenoma of ovary    left   Neuropathy    Paresthesias    Peripheral neuropathy    Radiation 04/2005   Stroke [facility])    related to when taken compazine   Ulcerative colitis ([place])    with chronic diarrhea   Urinary tract infection    x 3   Vertigo 2014   Vitamin D deficiency      Family History  Problem Relation Age of Onset   Breast cancer Mother 57   Heart failure Father    Heart disease Father    Cancer Maternal Grandmother        COLON   Heart failure Paternal Grandmother    Heart disease Paternal Grandmother      Current Outpatient Medications:    albuterol (  VENTOLIN HFA) 108 (90 Base) MCG/ACT inhaler, Inhale 2 puffs into the lungs every 6 (six) hours as needed for wheezing or shortness of breath., Disp: 8 g, Rfl: 2   amitriptyline (ELAVIL) 50 MG tablet, Take 1 tablet (50 mg total) by mouth at bedtime., Disp: 90 tablet, Rfl: 1   busPIRone (BUSPAR) 5 MG tablet, Take 1 tablet (5 mg total) by mouth 2 (two) times daily., Disp: 180 tablet, Rfl: 1   cetirizine (ZYRTEC) 10 MG tablet, Take 10 mg by mouth daily., Disp: , Rfl:    dicyclomine (BENTYL) 20 MG tablet, Take 20 mg by mouth 4 (four) times daily -  before meals and at bedtime. Up to four times daily as needed, Disp: , Rfl:    eszopiclone (LUNESTA) 2 MG TABS tablet, TAKE ONE TABLET by MOUTH AT bedtime as needed FOR SLEEP. Take immediately BEFORE bedtime, Disp: 30 tablet, Rfl: 5   folic acid (FOLVITE)  1 MG tablet, Take 3 mg by mouth daily. , Disp: , Rfl:    gabapentin (NEURONTIN) 300 MG capsule, Take 2 capsules (600 mg total) by mouth 3 (three) times daily., Disp: 180 capsule, Rfl: 3   golimumab (SIMPONI ARIA) 50 MG/4ML SOLN injection, 50 mg See admin instructions., Disp: , Rfl:    loperamide (IMODIUM) 2 MG capsule, Take 1 capsule (2 mg total) by mouth 4 (four) times daily as needed for diarrhea or loose stools., Disp: 12 capsule, Rfl: 0   mesalamine (APRISO) 0.375 g 24 hr capsule, Take 375 mg by mouth See admin instructions. 2 capsules at bedtime, Disp: , Rfl:    methotrexate (50 MG/ML) 1 g injection, Inject into the vein every Monday. 0.4m every Monday, Disp: , Rfl:    olmesartan (BENICAR) 20 MG tablet, Take 1 tablet (20 mg total) by mouth daily., Disp: 90 tablet, Rfl: 1   omeprazole (PRILOSEC) 40 MG capsule, Take 1 capsule (40 mg total) by mouth daily., Disp: 30 capsule, Rfl: 3   ondansetron (ZOFRAN) 4 MG tablet, Take 4 mg by mouth every 6 (six) hours as needed for nausea or vomiting., Disp: , Rfl:    OVER THE COUNTER MEDICATION, Aleve cream for pain, Disp: , Rfl:    tiZANidine (ZANAFLEX) 2 MG tablet, Take 2 mg by mouth 3 (three) times daily., Disp: , Rfl:    traMADol (ULTRAM) 50 MG tablet, Take 50 mg by mouth at bedtime as needed for moderate pain., Disp: , Rfl:    Vitamin D, Ergocalciferol, (DRISDOL) 1.25 MG (50000 UNIT) CAPS capsule, Take 1 capsule (50,000 Units total) by mouth 2 (two) times a week. (Patient taking differently: Take 50,000 Units by mouth 2 (two) times a week. Monday and Wednesday), Disp: 24 capsule, Rfl: 1   Allergies  Allergen Reactions   Humira [Adalimumab] Anaphylaxis   Ampicillin Hives and Itching   Codeine    Compazine [Prochlorperazine Maleate] Other (See Comments)    "Stroke-like symptoms" Can tolerate Phenergan.   Cymbalta [Duloxetine Hcl] Other (See Comments)    Fainting   Imitrex [Sumatriptan Base] Hives and Nausea And Vomiting   Lyrica [Pregabalin] Other  (See Comments)    Fainting   Metoclopramide Hcl Hives   Percocet [Oxycodone-Acetaminophen] Itching   Effexor [Venlafaxine Hydrochloride] Hives and Palpitations   Tape Itching and Rash     Review of Systems  Constitutional: Negative.   Respiratory: Negative.  Negative for shortness of breath.   Cardiovascular: Negative.  Negative for chest pain and palpitations.  Gastrointestinal: Negative.   Neurological: Negative.  Negative  for headaches.  Psychiatric/Behavioral:  Negative for sleep disturbance. The patient has insomnia.     Today's Vitals   09/27/21 1100  BP: 128/70  Pulse: 91  Temp: 98.2 F (36.8 C)  TempSrc: Oral  Weight: 187 lb 6.4 oz (85 kg)  Height: 5' 8"  (1.727 m)   Body mass index is 28.49 kg/m.  Wt Readings from Last 3 Encounters:  09/27/21 187 lb 6.4 oz (85 kg)  04/21/21 191 lb 9.6 oz (86.9 kg)  01/14/21 180 lb (81.6 kg)    Objective:  Physical Exam Vitals reviewed.  Constitutional:      General: She is not in acute distress.    Appearance: Normal appearance.  Eyes:     Pupils: Pupils are equal, round, and reactive to light.  Cardiovascular:     Rate and Rhythm: Normal rate and regular rhythm.     Pulses: Normal pulses.     Heart sounds: Normal heart sounds. No murmur heard. Pulmonary:     Effort: Pulmonary effort is normal. No respiratory distress.     Breath sounds: Normal breath sounds. No wheezing.  Skin:    General: Skin is warm and dry.     Capillary Refill: Capillary refill takes less than 2 seconds.  Neurological:     General: No focal deficit present.     Mental Status: She is alert and oriented to person, place, and time.     Cranial Nerves: No cranial nerve deficit.     Motor: No weakness.  Psychiatric:        Mood and Affect: Mood normal.        Behavior: Behavior normal.        Thought Content: Thought content normal.        Judgment: Judgment normal.        Assessment And Plan:     1. Essential hypertension Comments: Blood  pressure is well controlled.   2. Abnormal ECG Comments: She had an abnormal EKG in ER which showed possible LVH, she has a history of HTN but is well controlled. Will refer to Cardiology - Ambulatory referral to Cardiology  3. Insomnia, unspecified type Comments: doing well with Lunesta - eszopiclone (LUNESTA) 2 MG TABS tablet; TAKE ONE TABLET by MOUTH AT bedtime as needed FOR SLEEP. Take immediately BEFORE bedtime  Dispense: 30 tablet; Refill: 5 - Ambulatory referral to Psychology  4. Anxiety Comments: She is under an increased amount of stress and having more problems sleeping. Will refer for counseling - busPIRone (BUSPAR) 5 MG tablet; Take 1 tablet (5 mg total) by mouth 2 (two) times daily.  Dispense: 180 tablet; Refill: 1 - Ambulatory referral to Psychology  5. Other depression Comments: Depression screen score is 15.  - Ambulatory referral to Psychology     Patient was given opportunity to ask questions. Patient verbalized understanding of the plan and was able to repeat key elements of the plan. All questions were answered to their satisfaction.  [name], FNP   I, [name], FNP, have reviewed all documentation for this visit. The documentation on 09/27/21 for the exam, diagnosis, procedures, and orders are all accurate and complete.   IF YOU HAVE BEEN REFERRED TO A SPECIALIST, IT MAY TAKE 1-2 WEEKS TO SCHEDULE/PROCESS THE REFERRAL. IF YOU HAVE NOT HEARD FROM US/SPECIALIST IN TWO WEEKS, PLEASE GIVE [place] A CALL AT [phone].   THE PATIENT IS ENCOURAGED TO PRACTICE SOCIAL DISTANCING DUE TO THE COVID-19 PANDEMIC.

## 2021-09-27 NOTE — Patient Instructions (Signed)

## 2021-10-06 ENCOUNTER — Encounter: Payer: Self-pay | Admitting: Internal Medicine

## 2021-10-06 ENCOUNTER — Other Ambulatory Visit: Payer: Self-pay

## 2021-10-06 ENCOUNTER — Ambulatory Visit (INDEPENDENT_AMBULATORY_CARE_PROVIDER_SITE_OTHER): Payer: 59 | Admitting: Internal Medicine

## 2021-10-06 ENCOUNTER — Ambulatory Visit: Payer: Medicare Other

## 2021-10-06 VITALS — BP 140/110 | HR 76 | Ht 68.5 in | Wt 188.0 lb

## 2021-10-06 DIAGNOSIS — I517 Cardiomegaly: Secondary | ICD-10-CM

## 2021-10-06 DIAGNOSIS — K5180 Other ulcerative colitis without complications: Secondary | ICD-10-CM

## 2021-10-06 DIAGNOSIS — I10 Essential (primary) hypertension: Secondary | ICD-10-CM

## 2021-10-06 NOTE — Patient Instructions (Signed)
Visit Information  Thank you for taking time to visit with me today. Please don't hesitate to contact me if I can be of assistance to you before our next scheduled telephone appointment.  Following are the goals we discussed today:  Patient Goals/Self-Care Activities patient will:  -Attend upcomming schedule cardia echo -Engage with RN Care Manager regarding care management needs  - Contact SW as needed prior to next scheduled call  Our next appointment is by telephone on 3.6.23  Please call the care guide team at [phone] if you need to cancel or reschedule your appointment.   If you are experiencing a Mental Health or [place] or need someone to talk to, please call the Suicide and Crisis Lifeline: 988   Patient verbalizes understanding of instructions and care plan provided today and agrees to view in [place]. Active MyChart status confirmed with patient.    The care management team will reach out to the patient again over the next 45 days.   [name], BSW, CDP Social Worker, Certified Dementia Practitioner [place] [facility] [place] [facility] [phone]

## 2021-10-06 NOTE — Chronic Care Management (AMB) (Signed)
Social Work Note  10/06/2021 Name: Gabrielle Hawkins MRN: [id number] DOB: 10-Oct-1965  Gabrielle Hawkins is a 56 y.o. year old female who is a primary care patient of Glendale Chard, MD.  The Care Management team was consulted for assistance with chronic disease management and care coordination needs.  Gabrielle Hawkins was given information about Care Management services today including:  Care Management services include personalized support from designated clinical staff supervised by her physician, including individualized plan of care and coordination with other care providers 24/7 contact phone numbers for assistance for urgent and routine care needs. The patient may stop care management services at any time (effective at the end of the month) by phone call to the office staff.  Patient agreed to services and consent obtained.   Engaged with patient by telephone for follow up visit in response to provider referral for social work chronic care management and care coordination services.  Assessment: Review of patient past medical history, allergies, medications, and health status, including review of pertinent consultant reports was performed as part of comprehensive evaluation and provision of care management/care coordination services.   SDOH (Social Determinants of Health) assessments and interventions performed:  No    Advanced Directives Status: Not addressed in this encounter.  Care Plan  Allergies  Allergen Reactions   Humira [Adalimumab] Anaphylaxis   Ampicillin Hives and Itching   Codeine    Compazine [Prochlorperazine Maleate] Other (See Comments)    "Stroke-like symptoms" Can tolerate Phenergan.   Cymbalta [Duloxetine Hcl] Other (See Comments)    Fainting   Imitrex [Sumatriptan Base] Hives and Nausea And Vomiting   Lyrica [Pregabalin] Other (See Comments)    Fainting   Metoclopramide Hcl Hives   Percocet [Oxycodone-Acetaminophen] Itching   Effexor [Venlafaxine  Hydrochloride] Hives and Palpitations   Tape Itching and Rash    Outpatient Encounter Medications as of 10/06/2021  Medication Sig Note   albuterol (VENTOLIN HFA) 108 (90 Base) MCG/ACT inhaler Inhale 2 puffs into the lungs every 6 (six) hours as needed for wheezing or shortness of breath.    amitriptyline (ELAVIL) 50 MG tablet Take 1 tablet (50 mg total) by mouth at bedtime.    busPIRone (BUSPAR) 5 MG tablet Take 1 tablet (5 mg total) by mouth 2 (two) times daily.    cetirizine (ZYRTEC) 10 MG tablet Take 10 mg by mouth daily.    dicyclomine (BENTYL) 20 MG tablet Take 20 mg by mouth 4 (four) times daily -  before meals and at bedtime. Up to four times daily as needed 07/09/2020: Patient is taking 1-2 times daily    eszopiclone (LUNESTA) 2 MG TABS tablet TAKE ONE TABLET by MOUTH AT bedtime as needed FOR SLEEP. Take immediately BEFORE bedtime    folic acid (FOLVITE) 1 MG tablet Take 3 mg by mouth daily.     gabapentin (NEURONTIN) 300 MG capsule Take 2 capsules (600 mg total) by mouth 3 (three) times daily.    golimumab (SIMPONI ARIA) 50 MG/4ML SOLN injection 50 mg See admin instructions.    loperamide (IMODIUM) 2 MG capsule Take 1 capsule (2 mg total) by mouth 4 (four) times daily as needed for diarrhea or loose stools.    mesalamine (APRISO) 0.375 g 24 hr capsule Take 375 mg by mouth See admin instructions. 2 capsules at bedtime    methotrexate (50 MG/ML) 1 g injection Inject into the vein every Monday. 0.18m every Monday    olmesartan (BENICAR) 20 MG tablet Take 1 tablet (20 mg  total) by mouth daily.    omeprazole (PRILOSEC) 40 MG capsule Take 1 capsule (40 mg total) by mouth daily.    ondansetron (ZOFRAN) 4 MG tablet Take 4 mg by mouth every 6 (six) hours as needed for nausea or vomiting.    OVER THE COUNTER MEDICATION Aleve cream for pain    tiZANidine (ZANAFLEX) 2 MG tablet Take 2 mg by mouth 3 (three) times daily. 07/09/2020: Patient is taking once daily at bedtime    traMADol (ULTRAM) 50 MG  tablet Take 50 mg by mouth at bedtime as needed for moderate pain.    Vitamin D, Ergocalciferol, (DRISDOL) 1.25 MG (50000 UNIT) CAPS capsule Take 1 capsule (50,000 Units total) by mouth 2 (two) times a week. (Patient taking differently: Take 50,000 Units by mouth 2 (two) times a week. Monday and Wednesday)    [DISCONTINUED] inFLIXimab (REMICADE) 100 MG injection Inject 100 mg into the vein every 30 (thirty) days. Infuse by intravenous route every 4 weeks  Patient is receiving Inflectra brand verses Remicade brand due to insurance (Patient not taking: Reported on 04/10/2020)    [DISCONTINUED] temazepam (RESTORIL) 7.5 MG capsule Take 1 capsule (7.5 mg total) by mouth at bedtime as needed for sleep. (Patient not taking: Reported on 07/09/2020)    No facility-administered encounter medications on file as of 10/06/2021.    Patient Active Problem List   Diagnosis Date Noted   Right-sided low back pain with right-sided sciatica 05/06/2019   Right sided numbness 03/27/2019   Paresthesia 12/25/2018   Essential hypertension 06/26/2018   Abdominal pain, chronic, right lower quadrant 01/06/2015   Nausea and vomiting 01/05/2015   Ulcerative colitis ([place]) 01/05/2015   Enteritis    Nausea with vomiting    Intractable nausea and vomiting 04/29/2014   Benign paroxysmal positional vertigo 03/14/2013   Lymphedema of arm 12/30/2011    Conditions to be addressed/monitored: HTN and Ulcerative Colitis ;  Caregiver Stress  Care Plan : Social Work Plan of Care  Updates made by [name] since 10/06/2021 12:00 AM     Problem: Caregiver Stress      Long-Range Goal: Caregiver Coping Optimized   Start Date: 09/24/2021  This Visit's Progress: On track  Priority: High  Note:   Current Barriers:  Chronic disease management support and education needs related to  HTN, Ulcerative Colitis, Vitamin D Deficiency   Caregiver Stress related to caring for relative with Alzheimer's Disease  Social Worker Clinical  Goal(s):  patient will work with SW to identify and address any acute and/or chronic care coordination needs related to the self health management of  HTN, Ulcerative Colitis, Vitamin D Deficiency   patient will work with SW to address concerns related to caregiver stress SW Interventions:  Inter-disciplinary care team collaboration (see longitudinal plan of care) Collaboration with [name] FNP regarding development and update of comprehensive plan of care as evidenced by provider attestation and co-signature Telephonic visit completed with the patient to assess goal progression Determined the patient was seen by cardiology today for a new patient appointment with a diagnosis of left ventricular hypertrophy; patient reports she is scheduled for an echo on 2/14 Advised the patient SW will inform RN Care Manager of upcoming echo Discussed RN Care Manager Glenard Haring [place] may reschedule upcomming call from 2/8 to after echo - patient stated understanding and agreeable if RN Care Manager chooses to reschedule Determined the patient continues to work with Mrs. Ronnald Ramp who is a Chief Executive Officer to obtain caregiver resources for her  aunt whom this patient cares for  Encouraged the patient to continue working with Mrs Ronnald Ramp to obtain resource assistance Scheduled follow up call over the next 45 days Collaboration with [place] to advise of updated cardiac treatment plan  Patient Goals/Self-Care Activities patient will:  -Attend upcomming schedule cardia echo -Engage with RN Care Manager regarding care management needs  -  Contact SW as needed prior to next scheduled call  Follow Up Plan: The care management team will reach out to the patient again over the next 45 days.        Follow Up Plan: SW will follow up with patient by phone over the next 45 days      [name], BSW, CDP Social Worker, Certified Dementia Practitioner [place] [facility] [place] [facility] [phone]

## 2021-10-06 NOTE — Progress Notes (Signed)
Cardiology Office Note:    Date:  10/06/2021   ID:  Gabrielle Hawkins, DOB [birth year], MRN [id number]  PCP:  Glendale Chard, MD   [place] Providers Cardiologist:  Janina Mayo, MD     Referring MD: [name], FNP   No chief complaint on file. LVH  History of Present Illness:    Gabrielle Hawkins is a 56 y.o. female with a hx below, breast cancer adriamycin, cytoxan, 2006 and radiation/right breast s/p lumpectomy and removal of lymph nodes and she is in remission, IBS, referral for LVH  She states that she went to the ED and having mild chest pains with her arm and shoulder. She feels more pain in her shoulder and her arm. It radiates around the breast. The duration has been 2 weeks. No provocation. Persistent dull and sharp pain.  She takes motrin and it can helps. 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 She has had some dizziness and has fainted. She stood up and went to the bathroom and fainted. She has mild hypertension. She was on prednisone. She had some swelling in the legs when she was working but not anymore. She is primarily taking care of her aunt. She gets winded with exertional activities and she rests when she needs to. She can walk up a flight of stairs ok. She denies PND, orthopnea. She has no hx ot DM2. No smoking history.   Her father has an ICD for CHF, noted significant bradycardia as well.  and her mother has CHF. No known diagnosis of hypertrophic cardiomyopathy. No family hx of sudden cardiac death.   She has hx of breast cancer in 2006 managed here. She was on doxorubicin. No hx of cardiotoxicity. XRT was to the right breast. S/p tamoxifen. She's in remission.   The 10-year ASCVD risk score (Arnett DK, et al., 2019) is: 5.5%   Values used to calculate the score:     Age: 8 years     Sex: Female     Is Non-Hispanic African American: Yes     Diabetic: No     Tobacco smoker: No     Systolic Blood Pressure: 650 mmHg     Is BP  treated: Yes     HDL Cholesterol: 60 mg/dL     Total Cholesterol: 204 mg/dL   Past Medical History:  Diagnosis Date   Anemia 1998   Anxiety    Arthritis    big toe    Asthma    BRCA1 negative    BRCA2 negative    Breast cancer ([place]) 2006   chem/radiation/2years tamoxifen   Chronic neck and back pain    Crohn's disease ([place])    Depression    Endometriosis    Fibroid    GERD (gastroesophageal reflux disease)    History of chemotherapy 01/2005   Hx of tamoxifen therapy    Hx of transfusion of packed red blood cells    IBS (irritable bowel syndrome)    Kidney stones    Lymphedema of arm    Migraines    just at dx of breast CA   Mucinous cystadenoma of ovary    left   Neuropathy    Paresthesias    Peripheral neuropathy    Radiation 04/2005   Ulcerative colitis ([place])    with chronic diarrhea   Urinary tract infection    x 3   Vertigo 2014   Vitamin D deficiency  Past Surgical History:  Procedure Laterality Date   ABDOMINAL HYSTERECTOMY  2007   TAH/BSO   BREAST SURGERY  2006   LUMPECTOMY   CHOLECYSTECTOMY     CHOLECYSTECTOMY N/A 04/29/2014   Procedure: LAPAROSCOPIC CHOLECYSTECTOMY WITH INTRAOPERATIVE CHOLANGIOGRAM;  Surgeon: Edward Jolly, MD;  Location: [facility];  Service: General;  Laterality: N/A;   COLONOSCOPY  June 2013   Dr. Benson Norway: sessile serrated adenoma   COLONOSCOPY WITH PROPOFOL N/A 03/13/2015   Procedure: COLONOSCOPY WITH PROPOFOL;  Surgeon: [name], MD;  Location: [facility];  Service: Endoscopy;  Laterality: N/A;   DILATION AND CURETTAGE OF UTERUS     FLEXIBLE SIGMOIDOSCOPY N/A 07/18/2014   Dr. Benson Norway: mild left-sided colitis, biopsies with quiescent UC   MYOMECTOMY     TONSILLECTOMY      Current Medications: Current Meds  Medication Sig   albuterol (VENTOLIN HFA) 108 (90 Base) MCG/ACT inhaler Inhale 2 puffs into the lungs every 6 (six) hours as needed for wheezing or shortness of breath.   amitriptyline (ELAVIL) 50 MG tablet Take 1  tablet (50 mg total) by mouth at bedtime.   busPIRone (BUSPAR) 5 MG tablet Take 1 tablet (5 mg total) by mouth 2 (two) times daily.   cetirizine (ZYRTEC) 10 MG tablet Take 10 mg by mouth daily.   dicyclomine (BENTYL) 20 MG tablet Take 20 mg by mouth 4 (four) times daily -  before meals and at bedtime. Up to four times daily as needed   eszopiclone (LUNESTA) 2 MG TABS tablet TAKE ONE TABLET by MOUTH AT bedtime as needed FOR SLEEP. Take immediately BEFORE bedtime   folic acid (FOLVITE) 1 MG tablet Take 3 mg by mouth daily.    gabapentin (NEURONTIN) 300 MG capsule Take 2 capsules (600 mg total) by mouth 3 (three) times daily.   golimumab (SIMPONI ARIA) 50 MG/4ML SOLN injection 50 mg See admin instructions.   loperamide (IMODIUM) 2 MG capsule Take 1 capsule (2 mg total) by mouth 4 (four) times daily as needed for diarrhea or loose stools.   mesalamine (APRISO) 0.375 g 24 hr capsule Take 375 mg by mouth See admin instructions. 2 capsules at bedtime   methotrexate (50 MG/ML) 1 g injection Inject into the vein every Monday. 0.68m every Monday   olmesartan (BENICAR) 20 MG tablet Take 1 tablet (20 mg total) by mouth daily.   omeprazole (PRILOSEC) 40 MG capsule Take 1 capsule (40 mg total) by mouth daily.   ondansetron (ZOFRAN) 4 MG tablet Take 4 mg by mouth every 6 (six) hours as needed for nausea or vomiting.   OVER THE COUNTER MEDICATION Aleve cream for pain   tiZANidine (ZANAFLEX) 2 MG tablet Take 2 mg by mouth 3 (three) times daily.   traMADol (ULTRAM) 50 MG tablet Take 50 mg by mouth at bedtime as needed for moderate pain.   Vitamin D, Ergocalciferol, (DRISDOL) 1.25 MG (50000 UNIT) CAPS capsule Take 1 capsule (50,000 Units total) by mouth 2 (two) times a week. (Patient taking differently: Take 50,000 Units by mouth 2 (two) times a week. 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    Patient has 2 years of college education.    Patient has 0 children.    Patient has a boyfriend.          Social Determinants of Health   Financial Resource Strain: Not on file  Food Insecurity: No Food Insecurity   Worried About Charity fundraiser in the Last Year: Never true   Ran Out of Food in the Last Year: Never true  Transportation Needs: No Transportation Needs   Lack of Transportation (Medical): No   Lack of Transportation (Non-Medical): No  Physical Activity: Not on file  Stress: Not on file  Social Connections: Not on file     Family History: The patient's family history includes Breast cancer (age of onset: 3) in her mother; Cancer in her maternal grandmother; Heart disease in her father and paternal grandmother; Heart failure in her father and paternal grandmother.  ROS:   Please see the history of present illness.     All other systems reviewed and are negative.  EKGs/Labs/Other Studies Reviewed:    The following studies were reviewed today:   EKG:  EKG is  ordered today.  The ekg ordered today demonstrates   NSR, LVH  Recent Labs: 11/17/2020: TSH  1.640 01/14/2021: ALT 18 09/21/2021: BUN 13; Creatinine, Ser 0.81; Hemoglobin 13.7; Platelets 185; Potassium 4.0; Sodium 136  Recent Lipid Panel    Component Value Date/Time   CHOL 204 (H) 11/17/2020 1525   TRIG 80 11/17/2020 1525   HDL 60 11/17/2020 1525   CHOLHDL 3.4 11/17/2020 1525   CHOLHDL 3.5 12/24/2009 0817   VLDL 10 12/24/2009 0817   LDLCALC 130 (H) 11/17/2020 1525     Risk Assessment/Calculations:           Physical Exam:    VS:  BP (!) 140/110 (BP Location: Left Arm, Patient Position: Sitting)    Pulse 76    Ht 5' 8.5" (1.74 m)    Wt 188 lb (85.3 kg)    SpO2 98%    BMI 28.17 kg/m     Wt Readings from Last 3 Encounters:  10/06/21 188 lb (85.3 kg)  09/27/21 187 lb 6.4 oz (85 kg)  04/21/21 191 lb 9.6 oz (86.9 kg)     GEN:  Well nourished, well developed in no acute distress HEENT: Normal NECK: No JVD; No carotid bruits LYMPHATICS: No lymphadenopathy CARDIAC: RRR, no murmurs, rubs, gallops RESPIRATORY:  Clear to auscultation without rales, wheezing or rhonchi  ABDOMEN: Soft, non-tender, non-distended MUSCULOSKELETAL:  No edema; No deformity  SKIN: Warm and dry NEUROLOGIC:  Alert and oriented x 3 PSYCHIATRIC:  Normal affect   ASSESSMENT:    #LVH: Ddx includes HCM v. hypertensive heart disease. She had no SEM murmur on exam to suggest obstruction. Will obtain TTE and evaluate further. Her blood pressure is typically in a good range. Higher today. Will re-assess on follow-up. Goal  blood pressure < 130/80 mmHg.  #Cardio-Onc: Has hx of treatment with doxorubicin which is associated with cardiac dysfunction with incidence ~20%. She has no known hx of cardiotoxicity. She had radiation to the right side, so low concern for accelerated atherosclerosis risk or valvular fibrosis.   PLAN:    In order of problems listed above:  TTE Follow up 3 months        Medication Adjustments/Labs and Tests Ordered: Current medicines are reviewed at length with the patient  today.  Concerns regarding medicines are outlined above.  Orders Placed This Encounter  Procedures   EKG 12-Lead   ECHOCARDIOGRAM COMPLETE   No orders of the defined types were placed in this encounter.   Patient Instructions  Medication Instructions:  No Changes In Medications at this time.  *If you need a refill on your cardiac medications before your next appointment, please call your pharmacy*  Testing/Procedures: Your physician has requested that you have an echocardiogram. Echocardiography is a painless test that uses sound waves to create images of your heart. It provides your doctor with information about the size and shape of your heart and how well your hearts chambers and valves are working. You may receive an ultrasound enhancing agent through an IV if needed to better visualize your heart during the echo.This procedure takes approximately one hour. There are no restrictions for this procedure. This will take place at the [address], [address].   Follow-Up: At [facility], you and your health needs are our priority.  As part of our continuing mission to provide you with exceptional heart care, we have created designated Provider Care Teams.  These Care Teams include your primary Cardiologist (physician) and Advanced Practice Providers (APPs -  Physician Assistants and Nurse Practitioners) who all work together to provide you with the care you need, when you need it.  Your next appointment:   3 month(s)  The format for your next appointment:   In Person  Provider:   Janina Mayo, MD     Signed, Janina Mayo, MD  10/06/2021 10:03 AM    [place]

## 2021-10-06 NOTE — Patient Instructions (Signed)
Medication Instructions:  No Changes In Medications at this time.  *If you need a refill on your cardiac medications before your next appointment, please call your pharmacy*  Testing/Procedures: Your physician has requested that you have an echocardiogram. Echocardiography is a painless test that uses sound waves to create images of your heart. It provides your doctor with information about the size and shape of your heart and how well your hearts chambers and valves are working. You may receive an ultrasound enhancing agent through an IV if needed to better visualize your heart during the echo.This procedure takes approximately one hour. There are no restrictions for this procedure. This will take place at the [address].   Follow-Up: At [facility], you and your health needs are our priority.  As part of our continuing mission to provide you with exceptional heart care, we have created designated Provider Care Teams.  These Care Teams include your primary Cardiologist (physician) and Advanced Practice Providers (APPs -  Physician Assistants and Nurse Practitioners) who all work together to provide you with the care you need, when you need it.  Your next appointment:   3 month(s)  The format for your next appointment:   In Person  Provider:   Janina Mayo, MD

## 2021-10-12 ENCOUNTER — Ambulatory Visit: Payer: Self-pay

## 2021-10-12 ENCOUNTER — Telehealth: Payer: Medicare Other

## 2021-10-12 DIAGNOSIS — E041 Nontoxic single thyroid nodule: Secondary | ICD-10-CM

## 2021-10-12 DIAGNOSIS — F3289 Other specified depressive episodes: Secondary | ICD-10-CM

## 2021-10-12 DIAGNOSIS — F419 Anxiety disorder, unspecified: Secondary | ICD-10-CM

## 2021-10-12 DIAGNOSIS — I2510 Atherosclerotic heart disease of native coronary artery without angina pectoris: Secondary | ICD-10-CM

## 2021-10-12 DIAGNOSIS — R9431 Abnormal electrocardiogram [ECG] [EKG]: Secondary | ICD-10-CM

## 2021-10-12 DIAGNOSIS — E559 Vitamin D deficiency, unspecified: Secondary | ICD-10-CM

## 2021-10-12 DIAGNOSIS — I10 Essential (primary) hypertension: Secondary | ICD-10-CM

## 2021-10-12 DIAGNOSIS — K5180 Other ulcerative colitis without complications: Secondary | ICD-10-CM

## 2021-10-13 ENCOUNTER — Telehealth: Payer: Medicare Other

## 2021-10-14 NOTE — Patient Instructions (Signed)
Visit Information  Thank you for taking time to visit with me today. Please don't hesitate to contact me if I can be of assistance to you before our next scheduled telephone appointment.  Following are the goals we discussed today:  (Copy and paste patient goals from clinical care plan here)  Our next appointment is by telephone on 12/10/21 at 12 noon  Please call the care guide team at [phone] if you need to cancel or reschedule your appointment.   If you are experiencing a Mental Health or [place] or need someone to talk to, please call 1-800-273-TALK (toll free, 24 hour hotline)   Patient verbalizes understanding of instructions and care plan provided today and agrees to view in [place]. Active MyChart status confirmed with patient.    Barb Merino, RN, BSN, CCM Care Management Coordinator [facility]  Direct Phone: [phone]

## 2021-10-14 NOTE — Chronic Care Management (AMB) (Signed)
Care Management    RN Visit Note  10/12/2021 Name: Gabrielle Hawkins MRN: [id number] DOB: 07/26/1966  Subjective: Gabrielle Hawkins is a 56 y.o. year old female who is a primary care patient of [name], [place]. The care management team was consulted for assistance with disease management and care coordination needs.    Engaged with patient by telephone for follow up visit in response to provider referral for case management and/or care coordination services.   Consent to Services:   Ms. Creswell was given information about Care Management services today including:  Care Management services includes personalized support from designated clinical staff supervised by her physician, including individualized plan of care and coordination with other care providers 24/7 contact phone numbers for assistance for urgent and routine care needs. The patient may stop case management services at any time by phone call to the office staff.  Patient agreed to services and consent obtained.   Assessment: Review of patient past medical history, allergies, medications, health status, including review of consultants reports, laboratory and other test data, was performed as part of comprehensive evaluation and provision of chronic care management services.   SDOH (Social Determinants of Health) assessments and interventions performed:    Care Plan  Allergies  Allergen Reactions   Humira [Adalimumab] Anaphylaxis   Ampicillin Hives and Itching   Codeine    Compazine [Prochlorperazine Maleate] Other (See Comments)    "Stroke-like symptoms" Can tolerate Phenergan.   Cymbalta [Duloxetine Hcl] Other (See Comments)    Fainting   Imitrex [Sumatriptan Base] Hives and Nausea And Vomiting   Lyrica [Pregabalin] Other (See Comments)    Fainting   Metoclopramide Hcl Hives   Percocet [Oxycodone-Acetaminophen] Itching   Effexor [Venlafaxine Hydrochloride] Hives and Palpitations   Tape Itching and Rash     Outpatient Encounter Medications as of 10/12/2021  Medication Sig Note   albuterol (VENTOLIN HFA) 108 (90 Base) MCG/ACT inhaler Inhale 2 puffs into the lungs every 6 (six) hours as needed for wheezing or shortness of breath.    amitriptyline (ELAVIL) 50 MG tablet Take 1 tablet (50 mg total) by mouth at bedtime.    busPIRone (BUSPAR) 5 MG tablet Take 1 tablet (5 mg total) by mouth 2 (two) times daily.    cetirizine (ZYRTEC) 10 MG tablet Take 10 mg by mouth daily.    dicyclomine (BENTYL) 20 MG tablet Take 20 mg by mouth 4 (four) times daily -  before meals and at bedtime. Up to four times daily as needed 07/09/2020: Patient is taking 1-2 times daily    eszopiclone (LUNESTA) 2 MG TABS tablet TAKE ONE TABLET by MOUTH AT bedtime as needed FOR SLEEP. Take immediately BEFORE bedtime    folic acid (FOLVITE) 1 MG tablet Take 3 mg by mouth daily.     gabapentin (NEURONTIN) 300 MG capsule Take 2 capsules (600 mg total) by mouth 3 (three) times daily.    golimumab (SIMPONI ARIA) 50 MG/4ML SOLN injection 50 mg See admin instructions.    loperamide (IMODIUM) 2 MG capsule Take 1 capsule (2 mg total) by mouth 4 (four) times daily as needed for diarrhea or loose stools.    mesalamine (APRISO) 0.375 g 24 hr capsule Take 375 mg by mouth See admin instructions. 2 capsules at bedtime    methotrexate (50 MG/ML) 1 g injection Inject into the vein every Monday. 0.55m every Monday    olmesartan (BENICAR) 20 MG tablet Take 1 tablet (20 mg total) by mouth daily.  omeprazole (PRILOSEC) 40 MG capsule Take 1 capsule (40 mg total) by mouth daily.    ondansetron (ZOFRAN) 4 MG tablet Take 4 mg by mouth every 6 (six) hours as needed for nausea or vomiting.    OVER THE COUNTER MEDICATION Aleve cream for pain    tiZANidine (ZANAFLEX) 2 MG tablet Take 2 mg by mouth 3 (three) times daily. 07/09/2020: Patient is taking once daily at bedtime    traMADol (ULTRAM) 50 MG tablet Take 50 mg by mouth at bedtime as needed for moderate  pain.    Vitamin D, Ergocalciferol, (DRISDOL) 1.25 MG (50000 UNIT) CAPS capsule Take 1 capsule (50,000 Units total) by mouth 2 (two) times a week. (Patient taking differently: Take 50,000 Units by mouth 2 (two) times a week. Monday and Wednesday)    No facility-administered encounter medications on file as of 10/12/2021.    Patient Active Problem List   Diagnosis Date Noted   Right-sided low back pain with right-sided sciatica 05/06/2019   Right sided numbness 03/27/2019   Paresthesia 12/25/2018   Essential hypertension 06/26/2018   Abdominal pain, chronic, right lower quadrant 01/06/2015   Nausea and vomiting 01/05/2015   Ulcerative colitis ([place]) 01/05/2015   Enteritis    Nausea with vomiting    Intractable nausea and vomiting 04/29/2014   Benign paroxysmal positional vertigo 03/14/2013   Lymphedema of arm 12/30/2011    Conditions to be addressed/monitored: Ulcerative Colitis, Vitamin D deficiency  Care Plan : [place] [facility]  Updates made by Lynne Logan, RN since 10/12/2021 12:00 AM     Problem: No plan established for management of chronic disease states (Ulcerative Colitis, Vitamin D deficiency)   Priority: High     Long-Range Goal: Development of plan of care for chronic disease management for Ulcerative Colitis, Vitamin D deficiency   Start Date: 07/13/2021  Expected End Date: 07/14/2023  Recent Progress: On track  Priority: High  Note:   Current Barriers:  Knowledge Deficits related to plan of care for management of Ulcerative Colitis, Vitamin D deficiency Chronic Disease Management support and education needs related to Ulcerative Colitis, Vitamin D deficiency Caregiver Stress   RNCM Clinical Goal(s):  Patient will verbalize basic understanding of  Ulcerative Colitis, Vitamin D deficiency disease process and self health management plan   take all medications exactly as prescribed and will call provider for medication related questions demonstrate  Ongoing health management independence   continue to work with RN Care Manager to address care management and care coordination needs related to  Ulcerative Colitis, Vitamin D deficiency will demonstrate ongoing self health care management ability    through collaboration with RN Care manager, provider, and care team.   Interventions: 1:1 collaboration with primary care provider regarding development and update of comprehensive plan of care as evidenced by provider attestation and co-signature Inter-disciplinary care team collaboration (see longitudinal plan of care) Evaluation of current treatment plan related to  self management and patient's adherence to plan as established by provider  Ulcerative Colitis Interventions: Status: (Goal on track:  Yes) Long Term Goal  Evaluation of current treatment plan related to  Ulcerative Colitis , self-management and patient's adherence to plan as established by provider. Discussed plans with patient for ongoing care management follow up and provided patient with direct contact information for care management team Reviewed medications with patient and discussed indication, dosage and frequency of prescribed medications for treatment of UC; educated on potential SE such as increased risk for infection and  when to call the doctor if needed; Reviewed scheduled/upcoming provider appointments including; Social Work referral for caregiver resources ; Discussed plans with patient for ongoing care management follow up and provided patient with direct contact information for care management team; Screening for signs and symptoms of depression related to chronic disease state;   Vitamin D deficiency Interventions: Status: (Condition stable.  Not addressed this visit.) Long Term Goal  Evaluation of current treatment plan related to  Vitamin D deficiency , self-management and patient's adherence to plan as established by provider. Review of patient status, including  review of consultant's reports, relevant laboratory and other test results, and medications completed. Reviewed medications with patient and discussed indication, dosage and frequency of prescribed Vitamin D and when retesting is recommending; Educated on importance to get at least 15 minutes of natural sunlight when possible, eat Vitamin D rich foods ; Discussed plans with patient for ongoing care management follow up and provided patient with direct contact information for care management team  LVH (left ventricular hypertrophy Interventions:  (Status:  New goal.)  Short Term Goal Evaluation of current treatment plan related to  left ventricular hypertrophy , self-management and patient's adherence to plan as established by provider Discussed and reviewed recent ED visit due to patient was experiencing chest pain and shortness of breath  Review of patient status, including review of consultant's reports, relevant laboratory and other test results, and medications completed Reviewed and discussed recent Cardiology follow up completed with Dr. Harl Bowie on 10/06/21 with the following Assessment/Plan noted:   PLAN:     TTE Follow up 3 month Medication Adjustments/Labs and Tests Ordered: Current medicines are reviewed at length with the patient today.  Concerns regarding medicines are outlined above.     Orders Placed This Encounter  Procedures   EKG 12-Lead   ECHOCARDIOGRAM COMPLETE    Patient Instructions  Medication Instructions:  No Changes In Medications at this time  Determined patient verbalizes understanding of her prescribed treatment recommendations  Educated patient on sign/symptoms suggestive of worsening condition and when to seek medical attention or call 911 Reviewed scheduled/upcoming provider appointments including: Echocardiogram scheduled for 10/19/21 @8 :35 AM Discussed plans with patient for ongoing care management follow up and provided patient with direct contact  information for care management team    Patient Goals/Self-Care Activities: Take all medications as prescribed Attend all scheduled provider appointments Call pharmacy for medication refills 3-7 days in advance of running out of medications Perform all self care activities independently  Perform IADL's (shopping, preparing meals, housekeeping, managing finances) independently Call provider office for new concerns or questions   Follow Up Plan:  Telephone follow up appointment with care management team member scheduled for:  12/10/21      Plan: Telephone follow up appointment with care management team member scheduled for:  12/10/21  Barb Merino, RN, BSN, CCM Care Management Coordinator [place] [facility]  Direct Phone: [phone]

## 2021-10-19 ENCOUNTER — Ambulatory Visit (HOSPITAL_COMMUNITY): Payer: 59 | Attending: Cardiology

## 2021-10-19 ENCOUNTER — Other Ambulatory Visit: Payer: Self-pay

## 2021-10-19 DIAGNOSIS — R079 Chest pain, unspecified: Secondary | ICD-10-CM

## 2021-10-19 DIAGNOSIS — I517 Cardiomegaly: Secondary | ICD-10-CM | POA: Diagnosis present

## 2021-10-19 LAB — ECHOCARDIOGRAM COMPLETE
Area-P 1/2: 3.81 cm2
S' Lateral: 3.5 cm

## 2021-10-28 ENCOUNTER — Ambulatory Visit: Payer: Medicare Other | Admitting: Nurse Practitioner

## 2021-11-08 ENCOUNTER — Telehealth: Payer: Medicare Other

## 2021-11-08 ENCOUNTER — Telehealth: Payer: Self-pay

## 2021-11-08 NOTE — Telephone Encounter (Signed)
?  Care Management  ? ?Follow Up Note ? ? ?11/08/2021 ?Name: Gabrielle Hawkins MRN: [id number] DOB: [birth year] ? ? ?Referred by: [name], FNP ?Reason for referral : Chronic Care Management (Unsuccessful call) ? ? ?An unsuccessful telephone outreach was attempted today. The patient was referred to the case management team for assistance with care management and care coordination.  ? ?Follow Up Plan: The care management team will reach out to the patient again over the next 21 days.  ? ?[name], BSW, CDP ?Social Worker, Certified Dementia Practitioner ?[facility] ?[phone] ? ?   ? ? ? ?

## 2021-11-10 ENCOUNTER — Telehealth (INDEPENDENT_AMBULATORY_CARE_PROVIDER_SITE_OTHER): Payer: 59 | Admitting: Nurse Practitioner

## 2021-11-10 ENCOUNTER — Encounter: Payer: Self-pay | Admitting: Nurse Practitioner

## 2021-11-10 DIAGNOSIS — G4700 Insomnia, unspecified: Secondary | ICD-10-CM | POA: Diagnosis not present

## 2021-11-10 DIAGNOSIS — F3289 Other specified depressive episodes: Secondary | ICD-10-CM

## 2021-11-10 NOTE — Progress Notes (Signed)
Virtual Visit via MyChart   This visit type was conducted due to national recommendations for restrictions regarding the COVID-19 Pandemic (e.g. social distancing) in an effort to limit this patient's exposure and mitigate transmission in our community.  Due to her co-morbid illnesses, this patient is at least at moderate risk for complications without adequate follow up.  This format is felt to be most appropriate for this patient at this time.  All issues noted in this document were discussed and addressed.  A limited physical exam was performed with this format.    This visit type was conducted due to national recommendations for restrictions regarding the COVID-19 Pandemic (e.g. social distancing) in an effort to limit this patient's exposure and mitigate transmission in our community.  Patients identity confirmed using two different identifiers.  This format is felt to be most appropriate for this patient at this time.  All issues noted in this document were discussed and addressed.  No physical exam was performed (except for noted visual exam findings with Video Visits).    Date:  11/10/2021   ID:  Gabrielle Hawkins, DOB [birth year], MRN [id number]  Patient Location:  Home - spoke with Gabrielle Hawkins  Provider location:   Office    Chief Complaint:  follow up Gabrielle Hawkins  History of Present Illness:    Gabrielle Hawkins is a 56 y.o. female who presents via video conferencing for a telehealth visit today.    The patient does not have symptoms concerning for COVID-19 infection (fever, chills, cough, or new shortness of breath).   The patient is doing a virtual visit insomnia f/u. She is sleeping better with the Lunesta. She has also been to [place] and had an ECHO which was normal and no concern for extensive atherosclerosis. She has not been able to speak to the counselor at [place] at [employer].   Hypertension This is a chronic problem. The current episode  started more than 1 year ago. The problem is unchanged. The problem is controlled. Pertinent negatives include no anxiety, chest pain, headaches, palpitations or shortness of breath. There are no associated agents to hypertension. Risk factors for coronary artery disease include obesity and sedentary lifestyle. Past treatments include alpha 1 blockers. There are no compliance problems.  There is no history of angina. There is no history of chronic renal disease.  Insomnia Primary symptoms: no sleep disturbance, frequent awakening.   The onset quality is sudden. The problem has been gradually worsening since onset. PMH includes: associated symptoms present.     Past Medical History:  Diagnosis Date   Anemia 1998   Anxiety    Arthritis    big toe    Asthma    BRCA1 negative    BRCA2 negative    Breast cancer ([place]) 2006   chem/radiation/2years tamoxifen   Chronic neck and back pain    Crohn's disease ([place])    Depression    Endometriosis    Fibroid    GERD (gastroesophageal reflux disease)    History of chemotherapy 01/2005   Hx of tamoxifen therapy    Hx of transfusion of packed red blood cells    IBS (irritable bowel syndrome)    Kidney stones    Lymphedema of arm    Migraines    just at dx of breast CA   Mucinous cystadenoma of ovary    left   Neuropathy    Paresthesias    Peripheral neuropathy    Radiation 04/2005  Ulcerative colitis ([place])    with chronic diarrhea   Urinary tract infection    x 3   Vertigo 2014   Vitamin D deficiency    Past Surgical History:  Procedure Laterality Date   ABDOMINAL HYSTERECTOMY  2007   TAH/BSO   BREAST SURGERY  2006   LUMPECTOMY   CHOLECYSTECTOMY     CHOLECYSTECTOMY N/A 04/29/2014   Procedure: LAPAROSCOPIC CHOLECYSTECTOMY WITH INTRAOPERATIVE CHOLANGIOGRAM;  Surgeon: Edward Jolly, MD;  Location: [facility];  Service: General;  Laterality: N/A;   COLONOSCOPY  June 2013   Dr. Benson Norway: sessile serrated adenoma   COLONOSCOPY WITH  PROPOFOL N/A 03/13/2015   Procedure: COLONOSCOPY WITH PROPOFOL;  Surgeon: [name], MD;  Location: [facility];  Service: Endoscopy;  Laterality: N/A;   DILATION AND CURETTAGE OF UTERUS     FLEXIBLE SIGMOIDOSCOPY N/A 07/18/2014   Dr. Benson Norway: mild left-sided colitis, biopsies with quiescent UC   MYOMECTOMY     TONSILLECTOMY       Current Meds  Medication Sig   albuterol (VENTOLIN HFA) 108 (90 Base) MCG/ACT inhaler Inhale 2 puffs into the lungs every 6 (six) hours as needed for wheezing or shortness of breath.   amitriptyline (ELAVIL) 50 MG tablet Take 1 tablet (50 mg total) by mouth at bedtime.   busPIRone (BUSPAR) 5 MG tablet Take 1 tablet (5 mg total) by mouth 2 (two) times daily.   cetirizine (ZYRTEC) 10 MG tablet Take 10 mg by mouth daily.   dicyclomine (BENTYL) 20 MG tablet Take 20 mg by mouth 4 (four) times daily -  before meals and at bedtime. Up to four times daily as needed   eszopiclone (LUNESTA) 2 MG TABS tablet TAKE ONE TABLET by MOUTH AT bedtime as needed FOR SLEEP. Take immediately BEFORE bedtime   folic acid (FOLVITE) 1 MG tablet Take 3 mg by mouth daily.    gabapentin (NEURONTIN) 300 MG capsule Take 2 capsules (600 mg total) by mouth 3 (three) times daily.   golimumab (SIMPONI ARIA) 50 MG/4ML SOLN injection 50 mg See admin instructions.   loperamide (IMODIUM) 2 MG capsule Take 1 capsule (2 mg total) by mouth 4 (four) times daily as needed for diarrhea or loose stools.   mesalamine (APRISO) 0.375 g 24 hr capsule Take 375 mg by mouth See admin instructions. 2 capsules at bedtime   methotrexate (50 MG/ML) 1 g injection Inject into the vein every Monday. 0.60m every Monday   olmesartan (BENICAR) 20 MG tablet Take 1 tablet (20 mg total) by mouth daily.   omeprazole (PRILOSEC) 40 MG capsule Take 1 capsule (40 mg total) by mouth daily.   ondansetron (ZOFRAN) 4 MG tablet Take 4 mg by mouth every 6 (six) hours as needed for nausea or vomiting.   OVER THE COUNTER MEDICATION Aleve  cream for pain   tiZANidine (ZANAFLEX) 2 MG tablet Take 2 mg by mouth 3 (three) times daily.   traMADol (ULTRAM) 50 MG tablet Take 50 mg by mouth at bedtime as needed for moderate pain.   Vitamin D, Ergocalciferol, (DRISDOL) 1.25 MG (50000 UNIT) CAPS capsule Take 1 capsule (50,000 Units total) by mouth 2 (two) times a week. (Patient taking differently: Take 50,000 Units by mouth 2 (two) times a week. Monday and Wednesday)     Allergies:   Humira [adalimumab], Ampicillin, Codeine, Compazine [prochlorperazine maleate], Cymbalta [duloxetine hcl], Imitrex [sumatriptan base], Lyrica [pregabalin], Metoclopramide hcl, Percocet [oxycodone-acetaminophen], Effexor [venlafaxine hydrochloride], and Tape   Social History   Tobacco Use  Smoking status: Never   Smokeless tobacco: Never  Vaping Use   Vaping Use: Never used  Substance Use Topics   Alcohol use: No   Drug use: No     Family Hx: The patient's family history includes Breast cancer (age of onset: 3) in her mother; Cancer in her maternal grandmother; Heart disease in her father and paternal grandmother; Heart failure in her father and paternal grandmother.  ROS:   Please see the history of present illness.    Review of Systems  Constitutional: Negative.   Respiratory: Negative.  Negative for shortness of breath.   Cardiovascular: Negative.  Negative for chest pain and palpitations.  Gastrointestinal: Negative.   Neurological: Negative.  Negative for headaches.  Psychiatric/Behavioral: Negative.  Negative for sleep disturbance.    All other systems reviewed and are negative.   Labs/Other Tests and Data Reviewed:    Recent Labs: 11/17/2020: TSH 1.640 01/14/2021: ALT 18 09/21/2021: BUN 13; Creatinine, Ser 0.81; Hemoglobin 13.7; Platelets 185; Potassium 4.0; Sodium 136   Recent Lipid Panel Lab Results  Component Value Date/Time   CHOL 204 (H) 11/17/2020 03:25 PM   TRIG 80 11/17/2020 03:25 PM   HDL 60 11/17/2020 03:25 PM   CHOLHDL  3.4 11/17/2020 03:25 PM   CHOLHDL 3.5 12/24/2009 08:17 AM   LDLCALC 130 (H) 11/17/2020 03:25 PM    Wt Readings from Last 3 Encounters:  10/06/21 188 lb (85.3 kg)  09/27/21 187 lb 6.4 oz (85 kg)  04/21/21 191 lb 9.6 oz (86.9 kg)     Exam:    Vital Signs:  There were no vitals taken for this visit.    Physical Exam Constitutional:      General: She is not in acute distress.    Appearance: Normal appearance.  Pulmonary:     Effort: Pulmonary effort is normal. No respiratory distress.  Neurological:     General: No focal deficit present.     Mental Status: She is alert and oriented to person, place, and time. Mental status is at baseline.     Cranial Nerves: No cranial nerve deficit.  Psychiatric:        Mood and Affect: Mood and affect normal.        Behavior: Behavior normal.        Thought Content: Thought content normal.        Cognition and Memory: Memory normal.        Judgment: Judgment normal.    ASSESSMENT & PLAN:    1. Insomnia, unspecified type She is sleeping much better since her last visit  2. Other depression I have given her the number to [facility] to schedule an appt they have called her 3 times and closed the referral.     COVID-19 Education: The signs and symptoms of COVID-19 were discussed with the patient and how to seek care for testing (follow up with PCP or arrange E-visit).  The importance of social distancing was discussed today.  Patient Risk:   After full review of this patients clinical status, I feel that they are at least moderate risk at this time.  Time:   Today, I have spent 6.5 minutes/ seconds with the patient with telehealth technology discussing above diagnoses.     Medication Adjustments/Labs and Tests Ordered: Current medicines are reviewed at length with the patient today.  Concerns regarding medicines are outlined above.   Tests Ordered: No orders of the defined types were placed in this  encounter.   Medication  Changes: No orders of the defined types were placed in this encounter.   Disposition:  Follow up prn  Signed, Octavio Manns

## 2021-11-10 NOTE — Patient Instructions (Signed)

## 2021-11-14 ENCOUNTER — Other Ambulatory Visit: Payer: Self-pay

## 2021-11-14 ENCOUNTER — Emergency Department (HOSPITAL_COMMUNITY)
Admission: EM | Admit: 2021-11-14 | Discharge: 2021-11-14 | Disposition: A | Discharge status: Home / Self Care | Payer: Medicare Other | Attending: Emergency Medicine | Admitting: Emergency Medicine

## 2021-11-14 ENCOUNTER — Emergency Department (HOSPITAL_COMMUNITY): Payer: Medicare Other

## 2021-11-14 ENCOUNTER — Telehealth (HOSPITAL_COMMUNITY): Payer: Self-pay | Admitting: Orthopedic Surgery

## 2021-11-14 ENCOUNTER — Encounter (HOSPITAL_COMMUNITY): Payer: Self-pay | Admitting: Emergency Medicine

## 2021-11-14 DIAGNOSIS — S52131A Displaced fracture of neck of right radius, initial encounter for closed fracture: Secondary | ICD-10-CM | POA: Diagnosis not present

## 2021-11-14 DIAGNOSIS — S52121A Displaced fracture of head of right radius, initial encounter for closed fracture: Secondary | ICD-10-CM

## 2021-11-14 DIAGNOSIS — S59901A Unspecified injury of right elbow, initial encounter: Secondary | ICD-10-CM | POA: Diagnosis present

## 2021-11-14 DIAGNOSIS — S52611A Displaced fracture of right ulna styloid process, initial encounter for closed fracture: Secondary | ICD-10-CM | POA: Insufficient documentation

## 2021-11-14 DIAGNOSIS — R52 Pain, unspecified: Secondary | ICD-10-CM

## 2021-11-14 DIAGNOSIS — W010XXA Fall on same level from slipping, tripping and stumbling without subsequent striking against object, initial encounter: Secondary | ICD-10-CM | POA: Diagnosis not present

## 2021-11-14 DIAGNOSIS — S63074A Dislocation of distal end of right ulna, initial encounter: Secondary | ICD-10-CM

## 2021-11-14 MED ORDER — HYDROCODONE-ACETAMINOPHEN 5-325 MG PO TABS: 1.0000 | ORAL_TABLET | Freq: Four times a day (QID) | ORAL | 0 refills | Status: DC

## 2021-11-14 MED ORDER — FENTANYL CITRATE PF 50 MCG/ML IJ SOSY
50.0000 ug | PREFILLED_SYRINGE | Freq: Once | INTRAMUSCULAR | Status: AC
  Administered 2021-11-14: 50 ug via INTRAVENOUS
  Filled 2021-11-14: qty 1

## 2021-11-14 MED ORDER — ONDANSETRON HCL 4 MG/2ML IJ SOLN
4.0000 mg | Freq: Once | INTRAMUSCULAR | Status: AC
  Administered 2021-11-14: 4 mg via INTRAVENOUS
  Filled 2021-11-14: qty 2

## 2021-11-14 MED ORDER — MORPHINE SULFATE (PF) 4 MG/ML IV SOLN
4.0000 mg | Freq: Once | INTRAVENOUS | Status: AC
  Administered 2021-11-14: 4 mg via INTRAVENOUS
  Filled 2021-11-14: qty 1

## 2021-11-14 MED ORDER — PROPOFOL 10 MG/ML IV BOLUS
2.0000 mg/kg | Freq: Once | INTRAVENOUS | Status: AC
  Administered 2021-11-14: 70 mg via INTRAVENOUS
  Filled 2021-11-14: qty 20

## 2021-11-14 NOTE — ED Provider Notes (Signed)
.  Sedation ? ?Date/Time: 11/14/2021 5:42 PM ?Performed by: Hayden Rasmussen, MD ?Authorized by: Hayden Rasmussen, MD  ? ?Consent:  ?  Consent obtained:  Written ?  Consent given by:  Patient ?  Risks discussed:  Allergic reaction, prolonged hypoxia resulting in organ damage, prolonged sedation necessitating reversal, respiratory compromise necessitating ventilatory assistance and intubation, inadequate sedation, dysrhythmia, nausea and vomiting ?  Alternatives discussed:  Analgesia without sedation ?Universal protocol:  ?  Procedure explained and questions answered to patient or proxy's satisfaction: yes   ?  Relevant documents present and verified: yes   ?  Test results available: yes   ?  Imaging studies available: yes   ?  Required blood products, implants, devices, and special equipment available: yes   ?  Immediately prior to procedure, a time out was called: yes   ?  Patient identity confirmed:  Verbally with patient ?Indications:  ?  Procedure performed:  Dislocation reduction ?  Procedure necessitating sedation performed by:  Physician performing sedation ?Pre-sedation assessment:  ?  Time since last food or drink:  5 ?  ASA classification: class 2 - patient with mild systemic disease   ?  Mouth opening:  3 or more finger widths ?  Thyromental distance:  4 finger widths ?  Mallampati score:  II - soft palate, uvula, fauces visible ?  Neck mobility: normal   ?  Pre-sedation assessments completed and reviewed: pre-procedure airway patency not reviewed, pre-procedure cardiovascular function not reviewed, pre-procedure hydration status not reviewed, pre-procedure mental status not reviewed, pre-procedure nausea and vomiting status not reviewed, pre-procedure pain level not reviewed, pre-procedure respiratory function not reviewed and pre-procedure temperature not reviewed   ?  Pre-sedation assessment completed:  11/14/2021 5:13 PM ?Immediate pre-procedure details:  ?  Reassessment: Patient reassessed immediately  prior to procedure   ?  Reviewed: vital signs, relevant labs/tests and NPO status   ?  Verified: bag valve mask available, emergency equipment available, intubation equipment available, IV patency confirmed, oxygen available and suction available   ?Procedure details (see MAR for exact dosages):  ?  Preoxygenation:  Nasal cannula ?  Sedation:  Propofol ?  Intra-procedure monitoring:  Blood pressure monitoring, cardiac monitor, continuous pulse oximetry, continuous capnometry, frequent LOC assessments and frequent vital sign checks ?  Intra-procedure events: none   ?  Total Provider sedation time (minutes):  15 ?Post-procedure details:  ?  Post-sedation assessment completed:  11/14/2021 7:00 PM ?  Attendance: Constant attendance by certified staff until patient recovered   ?  Recovery: Patient returned to pre-procedure baseline   ?  Post-sedation assessments completed and reviewed: post-procedure airway patency not reviewed, post-procedure cardiovascular function not reviewed, post-procedure hydration status not reviewed, post-procedure mental status not reviewed, post-procedure nausea and vomiting status not reviewed, pain score not reviewed, post-procedure respiratory function not reviewed and post-procedure temperature not reviewed   ?  Procedure completion:  Tolerated well, no immediate complications  ?  ?Hayden Rasmussen, MD ?11/15/21 1030 ? ?

## 2021-11-14 NOTE — ED Triage Notes (Signed)
Pt to the ED after a fall with obvious injury to her right Elbow. ? ?Pt had 4 mg of zofran and 1 mg of morphine in route with no relief. ? ? ?

## 2021-11-14 NOTE — ED Notes (Signed)
Pt is in too much pain and distress to put on cardiac monitor at this time. Will try again later. Nurse notified ?

## 2021-11-14 NOTE — ED Provider Notes (Signed)
[facility] EMERGENCY DEPARTMENT Provider Note   CSN: [id number] Arrival date & time: 11/14/21  1508     History Chief Complaint  Patient presents with   Lytle Michaels    Gabrielle Hawkins is a 56 y.o. female who presents to the emergency department with right elbow pain that started just prior to arrival after she slipped and fell getting trash out of her car.  Patient states she was getting dressed out of her car when she slipped and fell onto the right elbow.  She noticed immediate deformity and pain.  She was brought here via EMS.  Morphine was given by arrival which did not help her pain.  She has been unable to move the elbow secondary to pain.  No other injury.  Did not hit her head or lose conscious.  Does not take any anticoagulation.   Fall      Home Medications Prior to Admission medications   Medication Sig Start Date End Date Taking? Authorizing Provider  albuterol (VENTOLIN HFA) 108 (90 Base) MCG/ACT inhaler Inhale 2 puffs into the lungs every 6 (six) hours as needed for wheezing or shortness of breath. 04/13/20  Yes [name], FNP  amitriptyline (ELAVIL) 50 MG tablet Take 1 tablet (50 mg total) by mouth at bedtime. 09/27/21  Yes [name], FNP  busPIRone (BUSPAR) 5 MG tablet Take 1 tablet (5 mg total) by mouth 2 (two) times daily. Patient taking differently: Take 5 mg by mouth at bedtime. 09/27/21  Yes [name], FNP  cetirizine (ZYRTEC) 10 MG tablet Take 10 mg by mouth daily.   Yes [provider]  dicyclomine (BENTYL) 20 MG tablet Take 20 mg by mouth 4 (four) times daily -  before meals and at bedtime. Up to four times daily as needed   Yes [provider]  eszopiclone (LUNESTA) 2 MG TABS tablet TAKE ONE TABLET by MOUTH AT bedtime as needed FOR SLEEP. Take immediately BEFORE bedtime 09/27/21  Yes [name], FNP  folic acid (FOLVITE) 1 MG tablet Take 3 mg by mouth daily.    Yes [provider]  HYDROcodone-acetaminophen (NORCO/VICODIN)  5-325 MG tablet Take 1 tablet by mouth every 6 (six) hours. 11/14/21  Yes Raul Del, Cynthie Garmon M, PA-C  amitriptyline (ELAVIL) 25 MG tablet Take 25 mg by mouth at bedtime. Patient not taking: Reported on 11/14/2021 11/09/21   [provider]  gabapentin (NEURONTIN) 300 MG capsule Take 2 capsules (600 mg total) by mouth 3 (three) times daily. 04/21/20   Suzzanne Cloud, NP  golimumab (SIMPONI ARIA) 50 MG/4ML SOLN injection 50 mg See admin instructions.    [provider]  loperamide (IMODIUM) 2 MG capsule Take 1 capsule (2 mg total) by mouth 4 (four) times daily as needed for diarrhea or loose stools. 04/13/20   [name], FNP  mesalamine (APRISO) 0.375 g 24 hr capsule Take 375 mg by mouth See admin instructions. 2 capsules at bedtime    [provider]  methotrexate (50 MG/ML) 1 g injection Inject into the vein every Monday. 0.89m every Monday    [provider]  olmesartan (BENICAR) 20 MG tablet Take 1 tablet (20 mg total) by mouth daily. 04/21/21   MMinette Brine FNP  omeprazole (PRILOSEC) 40 MG capsule Take 1 capsule (40 mg total) by mouth daily. 04/29/21   NRozetta Nunnery MD  ondansetron (ZOFRAN) 4 MG tablet Take 4 mg by mouth every 6 (six) hours as needed for nausea or vomiting.    [provider]  OVER THE COUNTER MEDICATION Aleve cream for pain    [provider]  tiZANidine (ZANAFLEX) 2 MG tablet Take 2 mg by mouth 3 (three) times daily.    [provider]  traMADol (ULTRAM) 50 MG tablet Take 50 mg by mouth at bedtime as needed for moderate pain. 04/27/21   [provider]  Vitamin D, Ergocalciferol, (DRISDOL) 1.25 MG (50000 UNIT) CAPS capsule Take 1 capsule (50,000 Units total) by mouth 2 (two) times a week. Patient taking differently: Take 50,000 Units by mouth 2 (two) times a week. Monday and Wednesday 11/23/20   [name], FNP  inFLIXimab (REMICADE) 100 MG injection Inject 100 mg into the vein every 30 (thirty) days.  Infuse by intravenous route every 4 weeks  Patient is receiving Inflectra brand verses Remicade brand due to insurance Patient not taking: Reported on 04/10/2020  11/02/20  [provider]  temazepam (RESTORIL) 7.5 MG capsule Take 1 capsule (7.5 mg total) by mouth at bedtime as needed for sleep. Patient not taking: Reported on 07/09/2020 04/13/20 11/02/20  [name], FNP      Allergies    Humira [adalimumab], Ampicillin, Codeine, Compazine [prochlorperazine maleate], Cymbalta [duloxetine hcl], Imitrex [sumatriptan base], Lyrica [pregabalin], Metoclopramide hcl, Percocet [oxycodone-acetaminophen], Effexor [venlafaxine hydrochloride], and Tape    Review of Systems   Review of Systems  All other systems reviewed and are negative.  Physical Exam Updated Vital Signs BP 139/84    Pulse 81    Temp 98 F (36.7 C) (Oral)    Resp 14    Ht 5' 8.5" (1.74 m)    Wt 85.3 kg    SpO2 90%    BMI 28.18 kg/m  Physical Exam Vitals and nursing note reviewed.  Constitutional:      General: She is not in acute distress.    Appearance: Normal appearance.  HENT:     Head: Normocephalic and atraumatic.  Eyes:     General:        Right eye: No discharge.        Left eye: No discharge.  Cardiovascular:     Comments: Regular rate and rhythm.  S1/S2 are distinct without any evidence of murmur, rubs, or gallops.  Radial pulses are 2+ bilaterally.  Dorsalis pedis pulses are 2+ bilaterally.  No evidence of pedal edema. Pulmonary:     Comments: Clear to auscultation bilaterally.  Normal effort.  No respiratory distress.  No evidence of wheezes, rales, or rhonchi heard throughout. Abdominal:     General: Abdomen is flat. Bowel sounds are normal. There is no distension.     Tenderness: There is no abdominal tenderness. There is no guarding or rebound.  Musculoskeletal:        General: Normal range of motion.     Cervical back: Neck supple.     Comments: Obvious deformity over the right elbow.  There is  moderate swelling and mild ecchymosis.  She is neurovascularly intact distal to the elbow.  She has a strong 2+ radial pulse on the right.  Skin:    General: Skin is warm and dry.     Findings: No rash.  Neurological:     General: No focal deficit present.     Mental Status: She is alert.  Psychiatric:        Mood and Affect: Mood normal.        Behavior: Behavior normal.    ED Results / Procedures / Treatments   Labs (all labs ordered  are listed, but only abnormal results are displayed) Labs Reviewed - No data to display  EKG None  Radiology DG Elbow 2 Views Right  Result Date: 11/14/2021 CLINICAL DATA:  Post reduction elbow fracture dislocation. EXAM: RIGHT ELBOW - 2 VIEW COMPARISON:  Film earlier today FINDINGS: There is been interval relocation of the humeral ulnar joint. A displaced radial neck fracture is again identified and there appears to be displacement/anterior dislocation of the radial head on this single LATERAL view. IMPRESSION: 1. Interval relocation of the humeral ulnar joint. 2. Displaced radial neck fracture with apparent anterior dislocation at the radiocapitellar joint on this single view. Electronically Signed   By: Margarette Canada M.D.   On: 11/14/2021 17:58   DG Elbow 2 Views Right  Result Date: 11/14/2021 CLINICAL DATA:  Pain after a fall. EXAM: RIGHT ELBOW - 2 VIEW COMPARISON:  None. FINDINGS: AP and lateral views. There is dislocation of the elbow with both the radius and ulna being posteriorly positioned relative to the distal humerus. The radial neck is fractured. Ossific densities posterior to the distal humerus on the lateral view likely represent bone fragments. Diffuse soft tissue swelling. IMPRESSION: Fracture dislocation, as detailed above. Given the complexity of injury, CT may eventually be informative. Electronically Signed   By: Abigail Miyamoto M.D.   On: 11/14/2021 16:11    Procedures .Ortho Injury Treatment  Date/Time: 11/14/2021 5:53 PM Performed by:  Hendricks Limes, PA-C Authorized by: Hendricks Limes, PA-C   Consent:    Consent obtained:  Verbal   Consent given by:  Patient   Risks discussed:  Fracture, vascular damage, nerve damage and irreducible dislocationInjury location: elbow Location details: right elbow Injury type: dislocation Dislocation type: posterior Pre-procedure distal perfusion: normal Pre-procedure neurological function: normal Pre-procedure range of motion: reduced  Patient sedated: Yes. Refer to sedation procedure documentation for details of sedation. Manipulation performed: yes Reduction method: direct traction, supination, pronation and traction and counter traction Immobilization: splint and sling Splint type: long arm Splint Applied by: ED Nurse Supplies used: cotton padding, elastic bandage and plaster Post-procedure neurovascular assessment: post-procedure neurovascularly intact Post-procedure distal perfusion: normal Post-procedure neurological function: normal Post-procedure range of motion: normal      Medications Ordered in ED Medications  fentaNYL (SUBLIMAZE) injection 50 mcg (50 mcg Intravenous Given 11/14/21 1546)  ondansetron (ZOFRAN) injection 4 mg (4 mg Intravenous Given 11/14/21 1553)  propofol (DIPRIVAN) 10 mg/mL bolus/IV push 170.6 mg (70 mg Intravenous Given 11/14/21 1725)  morphine (PF) 4 MG/ML injection 4 mg (4 mg Intravenous Given 11/14/21 1814)    ED Course/ Medical Decision Making/ A&P Clinical Course as of 11/14/21 1921  Sun Nov 14, 2021  1618 She was here with acute right elbow pain after a fall.  Clinically has a dislocated elbow.  Distal pulses intact.  X-ray showing fracture dislocation.  Hand surgeon on-call consulted.  Will likely need reduction and splinting outpatient follow-up if successful. [MB]  [id number] I spoke with Dr. Zachery Dakins with orthopedics who recommends reduction of the dislocation and a long arm splint.  [CF]  [id number] I spoke with Dr. Zachery Dakins with  orthopedics post reduction and he recommended CT of the right elbow and follow up in the office in one week.  [CF]    Clinical Course User Index [CF] Hendricks Limes, PA-C [MB] Hayden Rasmussen, MD  Medical Decision Making Amount and/or Complexity of Data Reviewed Radiology: ordered.  Risk Prescription drug management.   This patient presents to the ED for concern of right elbow pain, this involves an extensive number of treatment options, and is a complaint that carries with it a high risk of complications and morbidity.  The differential diagnosis includes fracture and dislocation.   Co morbidities that complicate the patient evaluation  None   Additional history obtained:  Additional history obtained from nursing and EMS note   Imaging Studies ordered:  I ordered imaging studies including x-ray of the right elbow I independently visualized and interpreted imaging which showed radial head fracture with posterior ulnar dislocation. I agree with the radiologist interpretation   Cardiac Monitoring:  The patient was maintained on a cardiac monitor.  I personally viewed and interpreted the cardiac monitored which showed an underlying rhythm of: Normal sinus rhythm   Medicines ordered and prescription drug management:  I ordered medication including fentanyl for pain. Zofran for nausea. Propofol for conscious sedation.  Reevaluation of the patient after these medicines showed that the patient improved I have reviewed the patients home medicines and have made adjustments as needed     Consultations Obtained:  See above    Problem List / ED Course:  Right elbow pain secondary to radial head fracture with posterior olecranon dislocation.  This was reduced in the department.  Please see procedure note for full detail.  Patient's pain was controlled with fentanyl in the department.  Patient was also given Zofran for nausea and vomiting.   Patient was placed in a long-arm splint and sling.  Patient is neurovascular intact pre and post procedure.  I will have her follow-up with orthopedics for further evaluation.  I will write her a short prescription for narcotic pain medication in addition to anti-inflammatories.  Patient expressed full understanding.  She is safe for discharge.   Reevaluation:  After the interventions noted above, I reevaluated the patient and found that they have :improved   Dispostion:  After consideration of the diagnostic results and the patients response to treatment, I feel that the patent would benefit from outpatient follow-up with orthopedics.  Final Clinical Impression(s) / ED Diagnoses Final diagnoses:  Pain  Closed displaced fracture of head of right radius, initial encounter  Closed dislocation of distal end of right ulna, initial encounter    Rx / DC Orders ED Discharge Orders          Ordered    HYDROcodone-acetaminophen (NORCO/VICODIN) 5-325 MG tablet  Every 6 hours        11/14/21 1917              Myna [place], PA-C 11/14/21 1921    Hayden Rasmussen, MD 11/15/21 1115

## 2021-11-14 NOTE — Telephone Encounter (Signed)
Called patient to discuss surgical plan for elbow with Dr. Griffin Basil. Given her unstable elbow fx/dislocation will try to see if we can get her into surgery tomorrow 3/13 with Dr. Griffin Basil pending OR availability. Patient understands to be NPO after midnight. Will call her tomorrow AM with definitive OR plan. ? ?Charlies Constable, MD ?Orthopaedic Surgery ? ?

## 2021-11-14 NOTE — Discharge Instructions (Addendum)
Please take Norco for breakthrough pain.  You can also take 600 mg ibuprofen every 6 hours.  Please follow-up with orthopedics within the next week for surgical evaluation. ?

## 2021-11-15 ENCOUNTER — Other Ambulatory Visit: Payer: Self-pay

## 2021-11-15 ENCOUNTER — Encounter (HOSPITAL_BASED_OUTPATIENT_CLINIC_OR_DEPARTMENT_OTHER): Payer: Self-pay | Admitting: Orthopaedic Surgery

## 2021-11-15 ENCOUNTER — Ambulatory Visit (HOSPITAL_BASED_OUTPATIENT_CLINIC_OR_DEPARTMENT_OTHER): Payer: 59

## 2021-11-15 ENCOUNTER — Ambulatory Visit (HOSPITAL_COMMUNITY): Payer: 59

## 2021-11-15 ENCOUNTER — Ambulatory Visit (HOSPITAL_BASED_OUTPATIENT_CLINIC_OR_DEPARTMENT_OTHER): Payer: 59 | Admitting: Anesthesiology

## 2021-11-15 ENCOUNTER — Ambulatory Visit (HOSPITAL_BASED_OUTPATIENT_CLINIC_OR_DEPARTMENT_OTHER)
Admission: RE | Admit: 2021-11-15 | Discharge: 2021-11-15 | Disposition: A | Discharge status: Home / Self Care | Payer: 59 | Source: Ambulatory Visit | Attending: Orthopaedic Surgery | Admitting: Orthopaedic Surgery

## 2021-11-15 ENCOUNTER — Encounter (HOSPITAL_BASED_OUTPATIENT_CLINIC_OR_DEPARTMENT_OTHER)
Admission: RE | Disposition: A | Discharge status: Home / Self Care | Payer: Self-pay | Source: Ambulatory Visit | Attending: Orthopaedic Surgery

## 2021-11-15 DIAGNOSIS — Z853 Personal history of malignant neoplasm of breast: Secondary | ICD-10-CM | POA: Insufficient documentation

## 2021-11-15 DIAGNOSIS — K5190 Ulcerative colitis, unspecified, without complications: Secondary | ICD-10-CM | POA: Insufficient documentation

## 2021-11-15 DIAGNOSIS — I10 Essential (primary) hypertension: Secondary | ICD-10-CM | POA: Diagnosis not present

## 2021-11-15 DIAGNOSIS — J45909 Unspecified asthma, uncomplicated: Secondary | ICD-10-CM | POA: Diagnosis not present

## 2021-11-15 DIAGNOSIS — S53104A Unspecified dislocation of right ulnohumeral joint, initial encounter: Secondary | ICD-10-CM | POA: Diagnosis present

## 2021-11-15 DIAGNOSIS — S52041A Displaced fracture of coronoid process of right ulna, initial encounter for closed fracture: Secondary | ICD-10-CM | POA: Insufficient documentation

## 2021-11-15 DIAGNOSIS — S52121A Displaced fracture of head of right radius, initial encounter for closed fracture: Secondary | ICD-10-CM | POA: Diagnosis not present

## 2021-11-15 DIAGNOSIS — Z9221 Personal history of antineoplastic chemotherapy: Secondary | ICD-10-CM | POA: Insufficient documentation

## 2021-11-15 DIAGNOSIS — F419 Anxiety disorder, unspecified: Secondary | ICD-10-CM | POA: Diagnosis not present

## 2021-11-15 DIAGNOSIS — K219 Gastro-esophageal reflux disease without esophagitis: Secondary | ICD-10-CM | POA: Diagnosis not present

## 2021-11-15 DIAGNOSIS — Z79899 Other long term (current) drug therapy: Secondary | ICD-10-CM | POA: Insufficient documentation

## 2021-11-15 DIAGNOSIS — Z923 Personal history of irradiation: Secondary | ICD-10-CM | POA: Diagnosis not present

## 2021-11-15 DIAGNOSIS — W010XXA Fall on same level from slipping, tripping and stumbling without subsequent striking against object, initial encounter: Secondary | ICD-10-CM | POA: Insufficient documentation

## 2021-11-15 DIAGNOSIS — F32A Depression, unspecified: Secondary | ICD-10-CM | POA: Diagnosis not present

## 2021-11-15 DIAGNOSIS — S42401A Unspecified fracture of lower end of right humerus, initial encounter for closed fracture: Secondary | ICD-10-CM

## 2021-11-15 HISTORY — PX: ELBOW LIGAMENT RECONSTRUCTION: SHX6359

## 2021-11-15 HISTORY — PX: ORIF ULNAR FRACTURE: SHX5417

## 2021-11-15 HISTORY — PX: ORIF RADIAL FRACTURE: SHX5113

## 2021-11-15 LAB — SURGICAL PCR SCREEN
MRSA, PCR: NEGATIVE
Staphylococcus aureus: NEGATIVE

## 2021-11-15 SURGERY — OPEN REDUCTION INTERNAL FIXATION (ORIF) ULNAR FRACTURE
Anesthesia: General | Site: Elbow | Laterality: Right

## 2021-11-15 MED ORDER — FENTANYL CITRATE (PF) 100 MCG/2ML IJ SOLN
INTRAMUSCULAR | Status: AC
  Filled 2021-11-15: qty 2

## 2021-11-15 MED ORDER — ESMOLOL HCL 100 MG/10ML IV SOLN
INTRAVENOUS | Status: AC
  Filled 2021-11-15: qty 10

## 2021-11-15 MED ORDER — DEXAMETHASONE SODIUM PHOSPHATE 4 MG/ML IJ SOLN
INTRAMUSCULAR | Status: DC | PRN
  Administered 2021-11-15: 8 mg via INTRAVENOUS

## 2021-11-15 MED ORDER — LIDOCAINE 2% (20 MG/ML) 5 ML SYRINGE
INTRAMUSCULAR | Status: DC | PRN
  Administered 2021-11-15: 40 mg via INTRAVENOUS

## 2021-11-15 MED ORDER — MIDAZOLAM HCL 2 MG/2ML IJ SOLN
2.0000 mg | Freq: Once | INTRAMUSCULAR | Status: AC
Start: 2021-11-15 — End: 2021-11-15
  Administered 2021-11-15: 2 mg via INTRAVENOUS

## 2021-11-15 MED ORDER — ESMOLOL HCL 100 MG/10ML IV SOLN
INTRAVENOUS | Status: DC | PRN
  Administered 2021-11-15 (×2): 30 mg via INTRAVENOUS

## 2021-11-15 MED ORDER — AMISULPRIDE (ANTIEMETIC) 5 MG/2ML IV SOLN
INTRAVENOUS | Status: AC
  Filled 2021-11-15: qty 4

## 2021-11-15 MED ORDER — VANCOMYCIN HCL 1000 MG IV SOLR
INTRAVENOUS | Status: AC
Start: 2021-11-15 — End: ?
  Filled 2021-11-15: qty 20

## 2021-11-15 MED ORDER — DEXAMETHASONE SODIUM PHOSPHATE 10 MG/ML IJ SOLN
INTRAMUSCULAR | Status: AC
  Filled 2021-11-15: qty 1

## 2021-11-15 MED ORDER — CEFAZOLIN SODIUM-DEXTROSE 2-4 GM/100ML-% IV SOLN
2.0000 g | INTRAVENOUS | Status: AC
  Administered 2021-11-15: 2 g via INTRAVENOUS

## 2021-11-15 MED ORDER — ONDANSETRON HCL 4 MG PO TABS: 4.0000 mg | ORAL_TABLET | Freq: Three times a day (TID) | ORAL | 0 refills | Status: DC | PRN

## 2021-11-15 MED ORDER — CELECOXIB 100 MG PO CAPS: 100.0000 mg | ORAL_CAPSULE | Freq: Two times a day (BID) | ORAL | 0 refills | Status: AC

## 2021-11-15 MED ORDER — GABAPENTIN 300 MG PO CAPS: 300.0000 mg | ORAL_CAPSULE | Freq: Two times a day (BID) | ORAL | 0 refills | Status: DC

## 2021-11-15 MED ORDER — PROPOFOL 10 MG/ML IV BOLUS
INTRAVENOUS | Status: AC
  Filled 2021-11-15: qty 20

## 2021-11-15 MED ORDER — ONDANSETRON HCL 4 MG/2ML IJ SOLN
INTRAMUSCULAR | Status: DC | PRN
  Administered 2021-11-15: 4 mg via INTRAVENOUS

## 2021-11-15 MED ORDER — BUPIVACAINE-EPINEPHRINE (PF) 0.5% -1:200000 IJ SOLN
INTRAMUSCULAR | Status: DC | PRN
  Administered 2021-11-15: 30 mL via PERINEURAL

## 2021-11-15 MED ORDER — HYDROMORPHONE HCL 1 MG/ML IJ SOLN: 0.2500 mg | INTRAMUSCULAR | Status: DC | PRN

## 2021-11-15 MED ORDER — CEFAZOLIN SODIUM-DEXTROSE 2-4 GM/100ML-% IV SOLN
INTRAVENOUS | Status: AC
  Filled 2021-11-15: qty 100

## 2021-11-15 MED ORDER — HYDROCODONE-ACETAMINOPHEN 10-325 MG PO TABS: 1.0000 | ORAL_TABLET | Freq: Four times a day (QID) | ORAL | 0 refills | Status: AC | PRN

## 2021-11-15 MED ORDER — ACETAMINOPHEN 500 MG PO TABS
ORAL_TABLET | ORAL | Status: AC
  Filled 2021-11-15: qty 2

## 2021-11-15 MED ORDER — ONDANSETRON HCL 4 MG/2ML IJ SOLN
INTRAMUSCULAR | Status: AC
Start: 2021-11-15 — End: ?
  Filled 2021-11-15: qty 2

## 2021-11-15 MED ORDER — AMISULPRIDE (ANTIEMETIC) 5 MG/2ML IV SOLN
10.0000 mg | Freq: Once | INTRAVENOUS | Status: AC
  Administered 2021-11-15: 10 mg via INTRAVENOUS

## 2021-11-15 MED ORDER — LIDOCAINE 2% (20 MG/ML) 5 ML SYRINGE
INTRAMUSCULAR | Status: AC
Start: 2021-11-15 — End: ?
  Filled 2021-11-15: qty 5

## 2021-11-15 MED ORDER — ACETAMINOPHEN 500 MG PO TABS
1000.0000 mg | ORAL_TABLET | Freq: Once | ORAL | Status: AC
  Administered 2021-11-15: 1000 mg via ORAL

## 2021-11-15 MED ORDER — POVIDONE-IODINE 10 % EX SWAB: 2.0000 "application " | Freq: Once | CUTANEOUS | Status: DC

## 2021-11-15 MED ORDER — LACTATED RINGERS IV SOLN: INTRAVENOUS | Status: DC

## 2021-11-15 MED ORDER — PROPOFOL 10 MG/ML IV BOLUS
INTRAVENOUS | Status: DC | PRN
  Administered 2021-11-15: 200 mg via INTRAVENOUS
  Administered 2021-11-15: 50 mg via INTRAVENOUS
  Administered 2021-11-15: 20 mg via INTRAVENOUS

## 2021-11-15 MED ORDER — MIDAZOLAM HCL 2 MG/2ML IJ SOLN
INTRAMUSCULAR | Status: AC
  Filled 2021-11-15: qty 2

## 2021-11-15 MED ORDER — 0.9 % SODIUM CHLORIDE (POUR BTL) OPTIME
TOPICAL | Status: DC | PRN
  Administered 2021-11-15: 150 mL

## 2021-11-15 MED ORDER — VANCOMYCIN HCL 1000 MG IV SOLR
INTRAVENOUS | Status: DC | PRN
  Administered 2021-11-15: 1000 mg via TOPICAL

## 2021-11-15 SURGICAL SUPPLY — 85 items
ANCH SUT 2 SHRT 1.45 DRLBT (Anchor) ×2 IMPLANT
ANCHOR JUGGERKNOT W/DRL 2/1.45 (Anchor) ×2 IMPLANT
APL PRP STRL LF DISP 70% ISPRP (MISCELLANEOUS) ×2
APL SKNCLS STERI-STRIP NONHPOA (GAUZE/BANDAGES/DRESSINGS) ×2
BENZOIN TINCTURE PRP APPL 2/3 (GAUZE/BANDAGES/DRESSINGS) ×3 IMPLANT
BIT DRILL CANN DSTL CUT.7X70MM (DRILL) ×2 IMPLANT
BIT DRILL SOLID SSC 2.7X40 (DRILL) ×1 IMPLANT
BLADE SURG 10 STRL SS (BLADE) ×3 IMPLANT
BLADE SURG 15 STRL LF DISP TIS (BLADE) ×2 IMPLANT
BLADE SURG 15 STRL SS (BLADE) ×3
BNDG CMPR 9X4 STRL LF SNTH (GAUZE/BANDAGES/DRESSINGS) ×2
BNDG ELASTIC 3X5.8 VLCR STR LF (GAUZE/BANDAGES/DRESSINGS) ×2 IMPLANT
BNDG ELASTIC 4X5.8 VLCR STR LF (GAUZE/BANDAGES/DRESSINGS) ×3 IMPLANT
BNDG ESMARK 4X9 LF (GAUZE/BANDAGES/DRESSINGS) ×2 IMPLANT
BNDG GAUZE ELAST 4 BULKY (GAUZE/BANDAGES/DRESSINGS) ×2 IMPLANT
CHLORAPREP W/TINT 26 (MISCELLANEOUS) ×3 IMPLANT
CUFF TOURN SGL QUICK 18 NS (TOURNIQUET CUFF) ×2 IMPLANT
CUFF TOURN SGL QUICK 24 (TOURNIQUET CUFF)
CUFF TRNQT CYL 24X4X16.5-23 (TOURNIQUET CUFF) ×1 IMPLANT
DRAPE EXTREMITY T 121X128X90 (DISPOSABLE) ×2 IMPLANT
DRAPE IMP U-DRAPE 54X76 (DRAPES) IMPLANT
DRAPE INCISE IOBAN 66X45 STRL (DRAPES) ×1 IMPLANT
DRAPE OEC MINIVIEW 54X84 (DRAPES) ×2 IMPLANT
DRAPE U-SHAPE 47X51 STRL (DRAPES) ×3 IMPLANT
DRAPE U-SHAPE 76X120 STRL (DRAPES) IMPLANT
DRILL CANN DISTAL CUT 2.7X70MM (DRILL) ×6
DRILL SOLID SSC 2.7X40 (DRILL) ×3
DRIVER QUICK CONNECT T10 (MISCELLANEOUS) ×2 IMPLANT
DRSG MEPILEX BORDER 4X8 (GAUZE/BANDAGES/DRESSINGS) ×1 IMPLANT
DRSG PAD ABDOMINAL 8X10 ST (GAUZE/BANDAGES/DRESSINGS) ×4 IMPLANT
ELECT REM PT RETURN 9FT ADLT (ELECTROSURGICAL) ×3
ELECTRODE REM PT RTRN 9FT ADLT (ELECTROSURGICAL) ×2 IMPLANT
FIXATOR IJS ELBOW BPA (Plate) ×2 IMPLANT
GAUZE SPONGE 4X4 12PLY STRL (GAUZE/BANDAGES/DRESSINGS) ×3 IMPLANT
GAUZE XEROFORM 1X8 LF (GAUZE/BANDAGES/DRESSINGS) ×1 IMPLANT
GAUZE XEROFORM 5X9 LF (GAUZE/BANDAGES/DRESSINGS) IMPLANT
GLOVE SRG 8 PF TXTR STRL LF DI (GLOVE) ×2 IMPLANT
GLOVE SURG ENC MOIS LTX SZ6.5 (GLOVE) ×3 IMPLANT
GLOVE SURG LTX SZ8 (GLOVE) ×3 IMPLANT
GLOVE SURG UNDER POLY LF SZ6.5 (GLOVE) ×3 IMPLANT
GLOVE SURG UNDER POLY LF SZ7 (GLOVE) ×2 IMPLANT
GLOVE SURG UNDER POLY LF SZ8 (GLOVE) ×3
GOWN STRL REUS W/ TWL LRG LVL3 (GOWN DISPOSABLE) ×3 IMPLANT
GOWN STRL REUS W/TWL LRG LVL3 (GOWN DISPOSABLE) ×6
GOWN STRL REUS W/TWL XL LVL3 (GOWN DISPOSABLE) ×3 IMPLANT
HEAD RADIAL 10X20 (Head) ×2 IMPLANT
IMPL STEM W/SCREW 7X26MM (Stem) ×1 IMPLANT
IMPLANT STEM W/SCREW 7X26MM (Stem) ×3 IMPLANT
K-WIRE STD TIP 1.5X127 (WIRE) ×3
K-WIRE STD TIP 2X152 (WIRE) ×3
KWIRE STD TIP 1.5X127 (WIRE) ×1 IMPLANT
KWIRE STD TIP 2X152 (WIRE) ×1 IMPLANT
NS IRRIG 1000ML POUR BTL (IV SOLUTION) ×3 IMPLANT
PACK ARTHROSCOPY DSU (CUSTOM PROCEDURE TRAY) ×3 IMPLANT
PACK BASIN DAY SURGERY FS (CUSTOM PROCEDURE TRAY) ×3 IMPLANT
PAD CAST 4YDX4 CTTN HI CHSV (CAST SUPPLIES) ×2 IMPLANT
PADDING CAST COTTON 4X4 STRL (CAST SUPPLIES) ×3
PENCIL SMOKE EVACUATOR (MISCELLANEOUS) ×3 IMPLANT
PIN AXIS 2.5X45MM (PIN) ×2 IMPLANT
RETRIEVER SUT HEWSON (MISCELLANEOUS) ×4 IMPLANT
SCREW CORT LOCK 3.5X22 (Screw) ×2 IMPLANT
SCREW GEMINUS PANL 3.5X18 (Screw) ×2 IMPLANT
SCREW POLY NON LOCK 3.5MMX14MM (Screw) ×2 IMPLANT
SLEEVE SCD COMPRESS KNEE MED (STOCKING) ×2 IMPLANT
SLING ARM FOAM STRAP LRG (SOFTGOODS) ×3 IMPLANT
SPIKE FLUID TRANSFER (MISCELLANEOUS) IMPLANT
SPLINT FAST PLASTER 5X30 (CAST SUPPLIES) ×10
SPLINT PLASTER CAST FAST 5X30 (CAST SUPPLIES) ×20 IMPLANT
SPONGE T-LAP 18X18 ~~LOC~~+RFID (SPONGE) ×3 IMPLANT
STAPLER VISISTAT 35W (STAPLE) IMPLANT
STRIP CLOSURE SKIN 1/2X4 (GAUZE/BANDAGES/DRESSINGS) ×5 IMPLANT
SUCTION FRAZIER HANDLE 10FR (MISCELLANEOUS) ×3
SUCTION TUBE FRAZIER 10FR DISP (MISCELLANEOUS) ×2 IMPLANT
SUT ETHILON 3 0 PS 1 (SUTURE) IMPLANT
SUT MAXBRAID #2 CVD NDL (SUTURE) ×2 IMPLANT
SUT MNCRL AB 4-0 PS2 18 (SUTURE) ×4 IMPLANT
SUT VIC AB 0 CT1 27 (SUTURE) ×6
SUT VIC AB 0 CT1 27XBRD ANBCTR (SUTURE) ×3 IMPLANT
SUT VIC AB 2-0 SH 27 (SUTURE)
SUT VIC AB 2-0 SH 27XBRD (SUTURE) ×1 IMPLANT
SUT VIC AB 3-0 SH 27 (SUTURE) ×3
SUT VIC AB 3-0 SH 27X BRD (SUTURE) ×1 IMPLANT
SYR BULB EAR ULCER 3OZ GRN STR (SYRINGE) ×3 IMPLANT
TOWEL GREEN STERILE FF (TOWEL DISPOSABLE) ×5 IMPLANT
YANKAUER SUCT BULB TIP NO VENT (SUCTIONS) ×3 IMPLANT

## 2021-11-15 NOTE — Anesthesia Procedure Notes (Signed)
Anesthesia Regional Block: Supraclavicular block  ? ?Pre-Anesthetic Checklist: , timeout performed,  Correct Patient, Correct Site, Correct Laterality,  Correct Procedure, Correct Position, site marked,  Risks and benefits discussed,  Pre-op evaluation,  At surgeon's request and post-op pain management ? ?Laterality: Right ? ?Prep: Maximum Sterile Barrier Precautions used, chloraprep     ?  ?Needles:  ?Injection technique: Single-shot ? ?Needle Type: Echogenic Stimulator Needle   ? ? ?Needle Length: 5cm  ?Needle Gauge: 22  ? ? ? ?Additional Needles: ? ? ?Procedures:,,,, ultrasound used (permanent image in chart),,    ?Narrative:  ?Start time: 11/15/2021 11:48 AM ?End time: 11/15/2021 11:58 AM ?Injection made incrementally with aspirations every 5 mL. ? ?Performed by: Personally  ?Anesthesiologist: Roderic Palau, MD ? ? ? ? ?

## 2021-11-15 NOTE — H&P (Signed)
.  dtvupd ?

## 2021-11-15 NOTE — Progress Notes (Signed)
Assisted Dr. Edmond Fitzgerald with right, ultrasound guided, supraclavicular block. Side rails up, monitors on throughout procedure. See vital signs in flow sheet. Tolerated Procedure well. 

## 2021-11-15 NOTE — Interval H&P Note (Signed)
All questions answered, patient wants to proceed with procedure. ? ?

## 2021-11-15 NOTE — Anesthesia Preprocedure Evaluation (Addendum)
Anesthesia Evaluation  ?Patient identified by MRN, date of birth, ID band ?Patient awake ? ? ? ?Reviewed: ?Allergy & Precautions, H&P , NPO status , Patient's Chart, lab work & pertinent test results ? ?Airway ?Mallampati: III ? ?TM Distance: >3 FB ?Neck ROM: Full ? ? ? Dental ?no notable dental hx. ?(+) Teeth Intact, Dental Advisory Given ?  ?Pulmonary ?asthma ,  ?  ?Pulmonary exam normal ?breath sounds clear to auscultation ? ? ? ? ? ? Cardiovascular ?hypertension, Pt. on medications ? ?Rhythm:Regular Rate:Normal ? ? ?  ?Neuro/Psych ? Headaches, Anxiety Depression   ? GI/Hepatic ?Neg liver ROS, PUD, GERD  Medicated,  ?Endo/Other  ?negative endocrine ROS ? Renal/GU ?Renal InsufficiencyRenal disease  ?negative genitourinary ?  ?Musculoskeletal ? ?(+) Arthritis , Osteoarthritis,   ? Abdominal ?  ?Peds ? Hematology ? ?(+) Blood dyscrasia, anemia ,   ?Anesthesia Other Findings ? ? Reproductive/Obstetrics ?negative OB ROS ? ?  ? ? ? ? ? ? ? ? ? ? ? ? ? ?  ?  ? ? ? ? ? ? ? ?Anesthesia Physical ?Anesthesia Plan ? ?ASA: 3 ? ?Anesthesia Plan: General  ? ?Post-op Pain Management: Regional block* and Tylenol PO (pre-op)*  ? ?Induction: Intravenous ? ?PONV Risk Score and Plan: 3 and Ondansetron, Dexamethasone and Midazolam ? ?Airway Management Planned: LMA and Oral ETT ? ?Additional Equipment:  ? ?Intra-op Plan:  ? ?Post-operative Plan: Extubation in OR ? ?Informed Consent: I have reviewed the patients History and Physical, chart, labs and discussed the procedure including the risks, benefits and alternatives for the proposed anesthesia with the patient or authorized representative who has indicated his/her understanding and acceptance.  ? ? ? ?Dental advisory given ? ?Plan Discussed with: CRNA ? ?Anesthesia Plan Comments:   ? ? ? ? ? ? ?Anesthesia Quick Evaluation ? ?

## 2021-11-15 NOTE — Transfer of Care (Signed)
Immediate Anesthesia Transfer of Care Note ? ?[name] ? ?Procedure(s) Performed: OPEN REDUCTION INTERNAL FIXATION (ORIF) RIGHT ULNAR FRACTURE (Right: Elbow) ?ELBOW LIGAMENT REPAIR/ LCL (Right: Elbow) ?RIGHT RADIAL HEAD REPLACEMENT (Right: Elbow) ? ?Patient Location: PACU ? ?Anesthesia Type:General and Regional ? ?Level of Consciousness: drowsy ? ?Airway & Oxygen Therapy: Patient Spontanous Breathing and Patient connected to face mask oxygen ? ?Post-op Assessment: Report given to RN and Post -op Vital signs reviewed and stable ? ?Post vital signs: Reviewed and stable ? ?Last Vitals:  ?Vitals Value Taken Time  ?BP 117/77   ?Temp    ?Pulse 90 11/15/21 1523  ?Resp 15 11/15/21 1523  ?SpO2 97 % 11/15/21 1523  ?Vitals shown include unvalidated device data. ? ?Last Pain:  ?Vitals:  ? 11/15/21 1200  ?TempSrc:   ?PainSc: Asleep  ?   ? ?Patients Stated Pain Goal: 4 (11/15/21 1118) ? ?Complications: No notable events documented. ?

## 2021-11-15 NOTE — Discharge Instructions (Addendum)
Diet: As you were doing prior to hospitalization  ? ?Shower:  May shower but keep the wounds dry. Leave the splint in place and keep the splint dry with a plastic bag. ? ?Dressing: Just leave the splint in place and we will change your bandages during your follow-up appointment.   ? ?Activity:  Increase activity slowly as tolerated, but follow the weight bearing instructions below.  The rules on driving is that you can not be taking narcotics while you drive, and you must feel in control of the vehicle.   ? ?Weight Bearing:   No weight bearing with right arm. Use sling for comfort.  ? ?To prevent constipation: you may use a stool softener such as - ? ?Colace (over the counter) 100 mg by mouth twice a day  ?Drink plenty of fluids (prune juice may be helpful) and high fiber foods ?Miralax (over the counter) for constipation as needed.   ? ?Itching:  If you experience itching with your medications, try taking only a single pain pill, or even half a pain pill at a time.  You may take up to 10 pain pills per day, and you can also use benadryl over the counter for itching or also to help with sleep.  ? ?Precautions:  If you experience chest pain or shortness of breath - call 911 immediately for transfer to the hospital emergency department!! ? ?If you develop a fever greater that 101 F, purulent drainage from wound, increased redness or drainage from wound, or calf pain -- Call the office at [phone]                                                ?Follow- Up Appointment:  Please call for an appointment to be seen at [facility] - [phone] ?Regional Anesthesia Blocks ? ?1. Numbness or the inability to move the "blocked" extremity may last from 3-48 hours after placement. The length of time depends on the medication injected and your individual response to the medication. If the numbness is not going away after 48 hours, call your surgeon. ? ?2. The extremity that is blocked will need to be protected until the  numbness is gone and the  Strength has returned. Because you cannot feel it, you will need to take extra care to avoid injury. Because it may be weak, you may have difficulty moving it or using it. You may not know what position it is in without looking at it while the block is in effect. ? ?3. For blocks in the legs and feet, returning to weight bearing and walking needs to be done carefully. You will need to wait until the numbness is entirely gone and the strength has returned. You should be able to move your leg and foot normally before you try and bear weight or walk. You will need someone to be with you when you first try to ensure you do not fall and possibly risk injury. ? ?4. Bruising and tenderness at the needle site are common side effects and will resolve in a few days. ? ?5. Persistent numbness or new problems with movement should be communicated to the surgeon or the [facility] [phone] [place] [phone]).  ? ?Post Anesthesia Home Care Instructions ? ?Activity: ?Get plenty of rest for the remainder of the day. A responsible individual must  stay with you for 24 hours following the procedure.  ?For the next 24 hours, DO NOT: ?-Drive a car ?-Paediatric nurse ?-Drink alcoholic beverages ?-Take any medication unless instructed by your physician ?-Make any legal decisions or sign important papers. ? ?Meals: ?Start with liquid foods such as gelatin or soup. Progress to regular foods as tolerated. Avoid greasy, spicy, heavy foods. If nausea and/or vomiting occur, drink only clear liquids until the nausea and/or vomiting subsides. Call your physician if vomiting continues. ? ?Special Instructions/Symptoms: ?Your throat may feel dry or sore from the anesthesia or the breathing tube placed in your throat during surgery. If this causes discomfort, gargle with warm salt water. The discomfort should disappear within 24 hours. ? ?If you had a scopolamine patch placed behind  your ear for the management of post- operative nausea and/or vomiting: ? ?1. The medication in the patch is effective for 72 hours, after which it should be removed.  Wrap patch in a tissue and discard in the trash. Wash hands thoroughly with soap and water. ?2. You may remove the patch earlier than 72 hours if you experience unpleasant side effects which may include dry mouth, dizziness or visual disturbances. ?3. Avoid touching the patch. Wash your hands with soap and water after contact with the patch. ?    ? ?  ?

## 2021-11-15 NOTE — Op Note (Signed)
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  Patient had an emergent issue as she had been sent from the ER with a dislocated elbow unbeknownst to the providers.  She had an ulnar nerve palsy preoperatively.    Her lateral ligaments and her brachialis fascia were extraordinarily attenuated secondary to her recurrent dislocation.  Based on this we felt that lateral ligament repair and coronoid fixation would not be enough to appropriately stabilize the elbow.  We instead felt that an internal joint stabilizer was appropriate.  We had robust fixation of a radial head component that there was a small attritional split noted from the initial fracture of the radial head and the neck.  This did not compromise her fixation and there was no extension of this after placement of our final implant.  Our lateral ligament repair was reasonable however we felt in this patient with ulcerative colitis that our internal joint stabilizer provided stability we needed to feel confident in our repair.  Additionally we have allow early motion after she is removed from her splint with no brace necessary.  Post-operative plan: The patient will be nonweightbearing in a splint for a week removed from splint and starting early motion following her terrible triad protocol.  Patient will need her hardware removed at 3 months postoperatively ideally.  No brace needed..  The patient will be discharged home.  DVT prophylaxis not indicated in this ambulatory upper extremity patient without significant  risk factors.   Pain control with PRN pain medication preferring oral medicines.  Follow up plan will be scheduled in approximately 7 days for incision check and XR.  Post-Op Diagnosis: Same Surgeons:Primary: Hiram Gash, MD Assistants: Merlene Pulling PA-C Location: [facility] OR ROOM 2 Anesthesia: General with regional anesthesia Antibiotics: Ancef 2 g with local vancomycin powder 1 g at the surgical site Tourniquet time:  Total Tourniquet Time Documented: Upper Arm (Right) - 110 minutes Total: Upper Arm (Right) - 110 minutes  Estimated Blood Loss: Minimal Complications: None Specimens: None Implants: Implant Name Type Inv. Item Serial No. Manufacturer Lot No. LRB No. Used Action  SCREW GEMINUS PANL 3.5X18 - [id number] Screw SCREW GEMINUS PANL 3.5X18  SKELETAL DYNAMICS STERILE ON SET Right 1 Implanted  HEAD RADIAL 10X20 - [id number] Head HEAD RADIAL 10X20  ZIMMER RECON(ORTH,TRAU,BIO,SG) STERILE ON SET Right 1 Implanted  IMPLANT STEM W/SCREW 7X26MM - [id number] Stem IMPLANT STEM W/SCREW 7X26MM  ZIMMER RECON(ORTH,TRAU,BIO,SG) STERILE ON SET Right 1 Implanted  ANCHOR JUGGERKNOT W/DRL 2/1.45 - [id number] Anchor ANCHOR JUGGERKNOT W/DRL 2/1.45  ZIMMER RECON(ORTH,TRAU,BIO,SG) STERILE ON SET Right 1 Implanted  Skeletal dynamics-distal elbow set, nonlocking screw 3.20m x 249m    STERILE ON SET Right 1 Implanted  FIXATOR IJS ELBOW BPA - [id number] FIXATOR IJS ELBOW BPA  SKELETAL DYNAMICS STERILE ON SET Right 1 Implanted  SCREW POLY NON LOCK 3.9M8NOM76HM - [id number] SCREW POLY NON LOCK 3.9M3MOQ94TMSKELETAL DYNAMICS STERILE ON SET Right 1 Implanted  distal elbow set-Axis pin 2.42m70m 442m38m SKELETAL DYNAMICS STERILE ON SET Right 1 Implanted    Indications for Surgery:   KimbNATIA FAHMYa 56 y86. female  with fall resulting in a terrible triad injury that was reduced in the emergency room initially but was recurrently unstable and found to be unstable after the patient left the ER.  I was  asked to provide emergent care and emergently placed the patient on the add-on list for today.  She had a preoperative ulnar nerve palsy.  She was indicated for stabilization of the elbow as well as open reduction sternal fixation of the coronoid and radial head arthroplasty in the setting of a splint displaced fracture.  Benefits and risks of operative and nonoperative management were discussed prior to surgery with patient/guardian(s) and informed consent form was completed.  Specific risks including infection, need for additional surgery, stiffness, heterotopic bone formation, loss of fixation, periprosthetic fracture amongst others.  Of note the patient was not able to tolerate NSAIDs in the standard fashion for heterotopic bone prevention.   Procedure:   The patient was identified properly. Informed consent was obtained and the surgical site was marked. The patient was taken up to suite where general anesthesia was induced.  The patient was positioned supine on a regular bed on a hand table.  The right elbow was prepped and draped in the usual sterile fashion.  Timeout was performed before the beginning of the case.  Tourniquet was used for the above duration.  We began with approach through Kocher's interval about 7 cm in length.  With the skin sharply achieving hemostasis as we progressed.  We able to identify the traumatic split in the lateral tissues and Kocher's interval.  We did split these fibers in line however the lateral ligaments were completely avulsed from the lateral aspect of the humerus.  Additionally the capsule was torn from the anterior part of the humerus as well and the brachialis fascia was barely attached to a coronoid fragment that was only about 5 x 9 mm in size.  We remove the multiple fragments of radial head and cleared the joint for any other loose bodies.  At that point the elbow was still dislocated and we did an open reduction maneuver to reduce it.  The lateral  ligaments being so attenuated as was the brachialis fascia we felt that repair of these alone would not be appropriate for stabilization and that an internal joint stabilizer from skeletal dynamics should also be used.  We began by prepping the radial neck for implantation of the radial head replacement.  We used the [employer].  We broached to a 7 mm stem and selected a 20 mm head with a 10 neck to recreate anatomy.  We took x-rays with the trial implants in place and were happy with the fit.  We downsized the head from a 22 to a 20 as the native head was measuring at a 21.  Once the trials were removed we continued on the case.  We then identified the coronoid fragment had some small amount of brachialis fascia still attached.  It was not appropriately sized for ORIF with a screw yet we felt suture fixation be appropriate.  The past sutures around the brachialis fascia at the bone tendon junction.  There is a max braid #2.  This point we made an accessory incision over the subcutaneous border of the ulna.  We dissected down to the bone.  At that point we were able to place the skeletal dynamics olecranon plate under fluoroscopic guidance appropriately positioning ensuring that her screws were not in the articular surface.  Once it was secured  we drilled 2 mm K wire holes from the superficial border of the ulna to the coronoid base and were able to shuttle our sutures from the brachialis fascia and coronoid to fixate the coronoid and perform an open reduction internal fixation.  We tied these over a bone bridge.  Coronoid was adequately reduced.  Importantly the anterior capsular and brachialis fascia was imbricated.  We then turned our attention to the transfixion pin from skeletal dynamics.  We placed the guide and sized appropriately checked our position on fluoroscopy.  We placed a K wire and then overdrilled for a 45 mm axis pin.  We then irrigated copiously and removed any loose bony  fragments.  We placed our final implants from the [employer] system for the radial head which involved a 7 x 26 mm stem, 10 neck length and a 20 mm head.  This was fixated with the appropriate screw.  We had good chatter on our stem placement.  There was no propagation of fracture lines.  We reduced the joint and checked our initial x-rays.  Were happy with our reduction.  At this point we placed a #2 loaded juggernaut suture anchor just proximal to the axis pin.  We used this to repair the lateral ligaments.  We had good tension on this.  We then assembled our hinged arm for the skeletal dynamics system stabilizing the joint holding it reduced while firing our screws to appropriate tension.    The final construct stably articulated and there was no block to pronosupination.  We had full motion at the time of surgery.  We irrigated the wound copiously before placing local antibiotic as listed above.  We closed the incision in a multilayer fashion with absorbable suture.  Sterile dressing was placed.  A long-arm splint was placed.  Patient was awoken taken to PACU in stable condition.  Merlene Pulling, PA-C, present and scrubbed throughout the case, critical for completion in a timely fashion, and for retraction, instrumentation, closure.

## 2021-11-15 NOTE — Anesthesia Postprocedure Evaluation (Signed)
Anesthesia Post Note ? ?[name] ? ?Procedure(s) Performed: OPEN REDUCTION INTERNAL FIXATION (ORIF) RIGHT ULNAR FRACTURE (Right: Elbow) ?ELBOW LIGAMENT REPAIR/ LCL (Right: Elbow) ?RIGHT RADIAL HEAD REPLACEMENT (Right: Elbow) ? ?  ? ?Patient location during evaluation: PACU ?Anesthesia Type: General and Regional ?Level of consciousness: awake and alert ?Pain management: pain level controlled ?Vital Signs Assessment: post-procedure vital signs reviewed and stable ?Respiratory status: spontaneous breathing, nonlabored ventilation and respiratory function stable ?Cardiovascular status: blood pressure returned to baseline and stable ?Postop Assessment: no apparent nausea or vomiting ?Anesthetic complications: no ? ? ?No notable events documented. ? ?Last Vitals:  ?Vitals:  ? 11/15/21 1600 11/15/21 1630  ?BP: (!) 131/91 (!) 142/82  ?Pulse: 87 89  ?Resp: 15 16  ?Temp:  36.5 ?C  ?SpO2: 94% 96%  ?  ?Last Pain:  ?Vitals:  ? 11/15/21 1630  ?TempSrc:   ?PainSc: 0-No pain  ? ? ?  ?  ?  ?  ?  ?  ? ?Mallory Schaad,W. EDMOND ? ? ? ? ?

## 2021-11-15 NOTE — H&P (Addendum)
Addendum: The patient was discharged unbeknownst to me from the ER after reduction however on CT scan prior to discharge she was dislocated.  This made this an emergent condition.  I called her immediately this morning and put her as an add-on.  The patient had an ulnar nerve palsy preoperatively.  She is at high risk for infection secondary to her history of ulcerative colitis.  The treatments for which she takes an infusion every 8 weeks with her most recent being last week.  The remainder of the note documents her condition.  PREOPERATIVE H&P  Chief Complaint: Right elbow pain  HPI: Gabrielle Hawkins is a 56 y.o. female who yesterday afternoon presented to the [facility] ED with right elbow pain after a fall.  She slipped and fell while getting trash out of her car and she landed directly on her right elbow.  She noticed immediate deformity and severe pain and was brought to the ED via EMS.  X-rays were taken showing right elbow fracture dislocation.  Right elbow reduction was performed in the ED and postreduction x-rays showed a reduced right elbow dislocation.  She was then splinted and a postreduction CT was taken, which showed her elbow to continue to be dislocated.  Right elbow pain is severe though improved with rest and worsened with motion.  She has elected for surgical management.   Past Medical History:  Diagnosis Date   Anemia 1998   Anxiety    Arthritis    big toe    Asthma    BRCA1 negative    BRCA2 negative    Breast cancer ([place]) 2006   chem/radiation/2years tamoxifen   Chronic neck and back pain    Crohn's disease ([place])    Depression    Endometriosis    Fibroid    GERD (gastroesophageal reflux disease)    History of chemotherapy 01/2005   Hx of tamoxifen therapy    Hx of transfusion of packed red blood cells    IBS (irritable bowel syndrome)    Kidney stones    Lymphedema of arm    Migraines    just at dx of breast CA   Mucinous cystadenoma of ovary    left    Neuropathy    Paresthesias    Peripheral neuropathy    Radiation 04/2005   Ulcerative colitis ([place])    with chronic diarrhea   Urinary tract infection    x 3   Vertigo 2014   Vitamin D deficiency    Past Surgical History:  Procedure Laterality Date   ABDOMINAL HYSTERECTOMY  2007   TAH/BSO   BREAST SURGERY  2006   LUMPECTOMY   CHOLECYSTECTOMY     CHOLECYSTECTOMY N/A 04/29/2014   Procedure: LAPAROSCOPIC CHOLECYSTECTOMY WITH INTRAOPERATIVE CHOLANGIOGRAM;  Surgeon: Edward Jolly, MD;  Location: [facility] ORS;  Service: General;  Laterality: N/A;   COLONOSCOPY  June 2013   Dr. Benson Norway: sessile serrated adenoma   COLONOSCOPY WITH PROPOFOL N/A 03/13/2015   Procedure: COLONOSCOPY WITH PROPOFOL;  Surgeon: [name], MD;  Location: [facility] ENDOSCOPY;  Service: Endoscopy;  Laterality: N/A;   DILATION AND CURETTAGE OF UTERUS     FLEXIBLE SIGMOIDOSCOPY N/A 07/18/2014   Dr. 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    Patient has 2 years of college education.    Patient has 0 children.    Patient has a boyfriend.          Social Determinants of Health   Financial Resource Strain: Not on file  Food Insecurity: No Food Insecurity   Worried About Charity fundraiser in the  Last Year: Never true   Ran Out of Food in the Last Year: Never true  Transportation Needs: No Transportation Needs   Lack of Transportation (Medical): No   Lack of Transportation (Non-Medical): No  Physical Activity: Not on file  Stress: Not on file  Social Connections: Not on file   Family History  Problem Relation Age of Onset   Breast cancer Mother 16   Heart failure Father    Heart disease Father    Cancer Maternal Grandmother        COLON   Heart failure Paternal Grandmother    Heart disease Paternal Grandmother    Allergies  Allergen Reactions   Humira [Adalimumab] Anaphylaxis   Ampicillin Hives and Itching   Codeine    Compazine [Prochlorperazine Maleate] Other (See Comments)    "Stroke-like symptoms" Can tolerate Phenergan.   Cymbalta [Duloxetine Hcl] Other (See Comments)    Fainting   Imitrex [Sumatriptan Base] Hives and Nausea And Vomiting   Lyrica [Pregabalin] Other (See Comments)    Fainting   Metoclopramide Hcl Hives   Percocet [Oxycodone-Acetaminophen] Itching   Effexor [Venlafaxine Hydrochloride] Hives and Palpitations   Tape Itching and Rash   Prior to Admission medications   Medication Sig Start Date End Date Taking? Authorizing Provider  albuterol (VENTOLIN HFA) 108 (90 Base) MCG/ACT inhaler Inhale 2 puffs into the lungs every 6 (six) hours as needed for wheezing or shortness of breath. 04/13/20   [name], FNP  amitriptyline (ELAVIL) 25 MG tablet Take 25 mg by mouth at bedtime. Patient not taking: Reported on 11/14/2021 11/09/21   [provider]  amitriptyline (ELAVIL) 50 MG tablet Take 1 tablet (50 mg total) by mouth at bedtime. 09/27/21   [name], FNP  busPIRone (BUSPAR) 5 MG tablet Take 1 tablet (5 mg total) by mouth 2 (two) times daily. Patient taking differently: Take 5 mg by mouth at bedtime. 09/27/21   [name], FNP  cetirizine (ZYRTEC) 10 MG tablet Take 10 mg by mouth daily.    [provider]  dicyclomine (BENTYL)  20 MG tablet Take 20 mg by mouth 4 (four) times daily -  before meals and at bedtime. Up to four times daily as needed    [provider]  eszopiclone (LUNESTA) 2 MG TABS tablet TAKE ONE TABLET by MOUTH AT bedtime as needed FOR SLEEP. Take immediately BEFORE bedtime 09/27/21   [name], FNP  folic acid (FOLVITE) 1 MG tablet Take 3 mg by mouth daily.  [provider]  gabapentin (NEURONTIN) 300 MG capsule Take 2 capsules (600 mg total) by mouth 3 (three) times daily. 04/21/20   Suzzanne Cloud, NP  golimumab (SIMPONI ARIA) 50 MG/4ML SOLN injection 50 mg See admin instructions.    [provider]  HYDROcodone-acetaminophen (NORCO/VICODIN) 5-325 MG tablet Take 1 tablet by mouth every 6 (six) hours. 11/14/21   Hendricks Limes, PA-C  loperamide (IMODIUM) 2 MG capsule Take 1 capsule (2 mg total) by mouth 4 (four) times daily as needed for diarrhea or loose stools. 04/13/20   [name], FNP  mesalamine (APRISO) 0.375 g 24 hr capsule Take 375 mg by mouth See admin instructions. 2 capsules at bedtime    [provider]  methotrexate (50 MG/ML) 1 g injection Inject into the vein every Monday. 0.23m every Monday    [provider]  olmesartan (BENICAR) 20 MG tablet Take 1 tablet (20 mg total) by mouth daily. 04/21/21   MMinette Brine FNP  omeprazole (PRILOSEC) 40 MG capsule Take 1 capsule (40 mg total) by mouth daily. 04/29/21   NRozetta Nunnery MD  ondansetron (ZOFRAN) 4 MG tablet Take 4 mg by mouth every 6 (six) hours as needed for nausea or vomiting.    [provider]  OVER THE COUNTER MEDICATION Aleve cream for pain    [provider]  tiZANidine (ZANAFLEX) 2 MG tablet Take 2 mg by mouth 3 (three) times daily.    [provider]  traMADol (ULTRAM) 50 MG tablet Take 50 mg by mouth at bedtime as needed for moderate pain. 04/27/21   [provider]  Vitamin D, Ergocalciferol, (DRISDOL) 1.25 MG (50000 UNIT) CAPS capsule  Take 1 capsule (50,000 Units total) by mouth 2 (two) times a week. Patient taking differently: Take 50,000 Units by mouth 2 (two) times a week. Monday and Wednesday 11/23/20   MMinette Brine FNP  inFLIXimab (REMICADE) 100 MG injection Inject 100 mg into the vein every 30 (thirty) days. Infuse by intravenous route every 4 weeks  Patient is receiving Inflectra brand verses Remicade brand due to insurance Patient not taking: Reported on 04/10/2020  11/02/20  [provider]  temazepam (RESTORIL) 7.5 MG capsule Take 1 capsule (7.5 mg total) by mouth at bedtime as needed for sleep. Patient not taking: Reported on 07/09/2020 04/13/20 11/02/20  MMinette Brine FNP     Positive ROS: All other systems have been reviewed and were otherwise negative with the exception of those mentioned in the HPI and as above.  Physical Exam: General: Alert, no acute distress Cardiovascular: No pedal edema Respiratory: No cyanosis, no use of accessory musculature GI: No organomegaly, abdomen is soft and non-tender Skin: No lesions in the area of chief complaint Neurologic: Sensation intact distally Psychiatric: Patient is competent for consent with normal mood and affect Lymphatic: No axillary or cervical lymphadenopathy  MUSCULOSKELETAL: Right elbow in posterior splint.  Patient reports that all fingers are numb currently secondary to the block but she does report that her ring and small finger were unable to extend and had dense numbness preoperatively.  My exam is limited secondary to the block being placed.  Splint in place.   CT R elbow 11/14/21 19:44 FINDINGS: The examination is technically limited due to image noise likely related to low-dose technique. Splint material is present.   Bones/Joint/Cartilage   There is complete posterior dislocation of the right elbow with greater than full shaft width posterior dislocation of the radius and ulna with respect to  the distal humerus. The ulnar  styloid process impacts the posterior distal humerus with avulsion fracture off of the styloid process. Small displaced fragment anterior to the humeral trochlea may represent the styloid process fragment. Comminuted and displaced fractures of the radial head and neck. Displaced fragment likely arising from the radial head or capitellar surface is demonstrated in the joint space between the radius and humeral capitellum.   Ligaments   Suboptimally assessed by CT.   Muscles and Tendons   Limited visualization. No intramuscular hematoma or mass is suggested.   Soft tissues   Soft tissue edema and infiltration of the posterior and lateral subcutaneous fat likely representing contusion.   IMPRESSION: 1. Limited visualization likely due to technique. 2. Posterior fracture dislocation of the elbow as discussed above. Soft tissue swelling and contusion.    Assessment: Right elbow fracture dislocation with ulnar nerve palsy preoperatively    Plan: Plan for Procedure(s): OPEN REDUCTION INTERNAL FIXATION (ORIF) ULNAR FRACTURE ORIF/Replacement Right radial head fracture Right ELBOW LIGAMENT REPAIR  The risks benefits and alternatives were discussed with the patient including but not limited to the risks of nonoperative treatment, versus surgical intervention including infection, bleeding, nerve injury,  blood clots, cardiopulmonary complications, morbidity, mortality, among others, and they were willing to proceed.   The patient is at high risk for stiffness, heterotopic bone formation, ulnar nerve and other neurovascular complaints long-term.  We will plan for an ORIF of the coronoid if possible with suture or hardware based fixation.  Radial head arthroplasty, lateral ligament repair and the possibility of an internal joint stabilizer.  She understands that this IJS may have to be removed at a later date.  We also discussed at length that the ulnar nerve recovery was  unpredictable.   Ventura Bruns, PA-C Cell [phone]   11/15/2021 10:32 AM

## 2021-11-15 NOTE — Anesthesia Procedure Notes (Signed)
Procedure Name: LMA Insertion ?Date/Time: 11/15/2021 1:05 PM ?Performed by: Ezequiel Kayser, CRNA ?Pre-anesthesia Checklist: Patient identified, Emergency Drugs available, Suction available and Patient being monitored ?Patient Re-evaluated:Patient Re-evaluated prior to induction ?Oxygen Delivery Method: Circle System Utilized ?Preoxygenation: Pre-oxygenation with 100% oxygen ?Induction Type: IV induction ?Ventilation: Mask ventilation without difficulty ?LMA: LMA inserted ?LMA Size: 4.0 ?Number of attempts: 1 ?Airway Equipment and Method: Bite block ?Placement Confirmation: positive ETCO2 ?Tube secured with: Tape ?Dental Injury: Teeth and Oropharynx as per pre-operative assessment  ? ? ? ? ?

## 2021-11-16 ENCOUNTER — Encounter (HOSPITAL_BASED_OUTPATIENT_CLINIC_OR_DEPARTMENT_OTHER): Payer: Self-pay | Admitting: Orthopaedic Surgery

## 2021-11-16 ENCOUNTER — Ambulatory Visit: Payer: Medicare Other

## 2021-11-16 ENCOUNTER — Telehealth: Payer: Self-pay

## 2021-11-16 DIAGNOSIS — K5180 Other ulcerative colitis without complications: Secondary | ICD-10-CM

## 2021-11-16 DIAGNOSIS — I10 Essential (primary) hypertension: Secondary | ICD-10-CM

## 2021-11-16 NOTE — Patient Instructions (Signed)
Social Worker Visit Information ? ?Goals we discussed today:  ?Patient Goals/Self-Care Activities ?patient will:  ?-Engage with PharmD to address medication related needs ?-Contact providers office as needed for clinical needs ?-Attend post-op appointment ?-Engage with RN Care Manager regarding care management needs ? - Contact SW as needed prior to next scheduled call ? ?Patient verbalizes understanding of instructions and care plan provided today and agrees to view in [place]. Active MyChart status confirmed with patient.   ? ?Follow Up Plan: SW will follow up with patient by phone over the next 10 days ? ? ?[name], BSW, CDP ?Social Worker, Certified Dementia Practitioner ?[facility] ?[phone] ? ?   ? ?

## 2021-11-16 NOTE — Telephone Encounter (Addendum)
?Non Chronic Care Management Patient  ? ?Follow Up Note ? ? ?11/16/2021 ?Name: Gabrielle Hawkins MRN: [id number] DOB: 07-07-1966 ? ?Referred by: [name], FNP ?Reason for referral : No chief complaint on file. ? ? ?Gabrielle Hawkins is a 56 y.o. year old female who is a primary care patient of [name], [place]. The CCM team was consulted for assistance with chronic disease management and care coordination needs.   ? ?Review of patient status, including review of consultants reports, relevant laboratory and other test results, and collaboration with appropriate care team members and the patient's provider was performed as part of comprehensive patient evaluation and provision of chronic care management services.   ? ? ?Outpatient Encounter Medications as of 11/16/2021  ?Medication Sig Note  ? albuterol (VENTOLIN HFA) 108 (90 Base) MCG/ACT inhaler Inhale 2 puffs into the lungs every 6 (six) hours as needed for wheezing or shortness of breath.   ? amitriptyline (ELAVIL) 50 MG tablet Take 1 tablet (50 mg total) by mouth at bedtime.   ? busPIRone (BUSPAR) 5 MG tablet Take 1 tablet (5 mg total) by mouth 2 (two) times daily. (Patient taking differently: Take 5 mg by mouth at bedtime.)   ? celecoxib (CELEBREX) 100 MG capsule Take 1 capsule (100 mg total) by mouth 2 (two) times daily for 7 days. To help with pain control.   ? cetirizine (ZYRTEC) 10 MG tablet Take 10 mg by mouth daily.   ? dicyclomine (BENTYL) 20 MG tablet Take 20 mg by mouth 4 (four) times daily -  before meals and at bedtime. Up to four times daily as needed 07/09/2020: Patient is taking 1-2 times daily   ? eszopiclone (LUNESTA) 2 MG TABS tablet TAKE ONE TABLET by MOUTH AT bedtime as needed FOR SLEEP. Take immediately BEFORE bedtime   ? folic acid (FOLVITE) 1 MG tablet Take 3 mg by mouth daily.    ? gabapentin (NEURONTIN) 300 MG capsule Take 1 capsule (300 mg total) by mouth 2 (two) times daily for 14 days. For 2 weeks post op for pain.   ? golimumab  (SIMPONI ARIA) 50 MG/4ML SOLN injection 50 mg See admin instructions.   ? HYDROcodone-acetaminophen (NORCO) 10-325 MG tablet Take 1 tablet by mouth every 6 (six) hours as needed for up to 7 days. You may use tylenol (acetaminophen) with this medication as long as total daily dose of tylenol does not exceed 4000 mg.   ? loperamide (IMODIUM) 2 MG capsule Take 1 capsule (2 mg total) by mouth 4 (four) times daily as needed for diarrhea or loose stools.   ? mesalamine (APRISO) 0.375 g 24 hr capsule Take 375 mg by mouth See admin instructions. 2 capsules at bedtime   ? methotrexate (50 MG/ML) 1 g injection Inject into the vein every Monday. 0.32m every Monday   ? olmesartan (BENICAR) 20 MG tablet Take 1 tablet (20 mg total) by mouth daily.   ? omeprazole (PRILOSEC) 40 MG capsule Take 1 capsule (40 mg total) by mouth daily.   ? ondansetron (ZOFRAN) 4 MG tablet Take 1 tablet (4 mg total) by mouth every 8 (eight) hours as needed for nausea or vomiting.   ? OVER THE COUNTER MEDICATION Aleve cream for pain   ? tiZANidine (ZANAFLEX) 2 MG tablet Take 2 mg by mouth 3 (three) times daily. 07/09/2020: Patient is taking once daily at bedtime   ? Vitamin D, Ergocalciferol, (DRISDOL) 1.25 MG (50000 UNIT) CAPS capsule Take 1 capsule (50,000 Units total)  by mouth 2 (two) times a week. (Patient taking differently: Take 50,000 Units by mouth 2 (two) times a week. Monday and Wednesday)   ? [DISCONTINUED] inFLIXimab (REMICADE) 100 MG injection Inject 100 mg into the vein every 30 (thirty) days. Infuse by intravenous route every 4 weeks  ?Patient is receiving Inflectra brand verses Remicade brand due to insurance (Patient not taking: Reported on 04/10/2020)   ? [DISCONTINUED] temazepam (RESTORIL) 7.5 MG capsule Take 1 capsule (7.5 mg total) by mouth at bedtime as needed for sleep. (Patient not taking: Reported on 07/09/2020)   ? ?No facility-administered encounter medications on file as of 11/16/2021.  ?  ? ?Objective: To help patient with  medication  ? ?Plan:  ?  ?Spoke with patient in regards to her medications. She wanted to make sure that she got refills on all of the medications that she needs to be taking. The last time she got a refill of her medications was January 23rd. She is currently taking Tinazidine 10 mg tablet, Lunesta 10 mg tablet, Amitryptiline 50 mg tablet, Olmesartan 20 mg tablet and she is taking Vitamin D capsule once per week.  She reports that she is not taking her Buspar everyday because she feels more drowsy. Patient reports that she did not talk to Gabrielle Hawkins about this when she did her virtual visit. I recommended that she discuss with PCP team. All of her medications are being refilled presently for a delivery date of today.  ? ?-She was enrolled in CCM services previously 04/2020. Her profile was unenrolled in CCM previously and we are going to determine enrollment status.  ? ?Orlando Penner, CPP, PharmD ?Clinical Pharmacist Practitioner ?[facility] ?[phone] ? ?

## 2021-11-16 NOTE — Chronic Care Management (AMB) (Signed)
? Social Work Note ? ?11/16/2021 ?Name: Gabrielle Hawkins MRN: [id number] DOB: Jun 12, 1966 ? ?Gabrielle Hawkins is a 56 y.o. year old female who is a primary care patient of [name], [place].  The Care Management team was consulted for assistance with chronic disease management and care coordination needs. ? ?[name] was given information about Care Management services today including:  ?Care Management services include personalized support from designated clinical staff supervised by her physician, including individualized plan of care and coordination with other care providers ?24/7 contact phone numbers for assistance for urgent and routine care needs. ?The patient may stop care management services at any time (effective at the end of the month) by phone call to the office staff. ? ?Patient agreed to services and consent obtained.  ? ?Engaged with patient by telephone for follow up visit in response to provider referral for social work chronic care management and care coordination services. ? ?Assessment: Review of patient past medical history, allergies, medications, and health status, including review of pertinent consultant reports was performed as part of comprehensive evaluation and provision of care management/care coordination services.  ? ?SDOH (Social Determinants of Health) assessments and interventions performed:  No ?  ? ?Advanced Directives Status: Not addressed in this encounter. ? ?Care Plan ? ?Allergies  ?Allergen Reactions  ? Humira [Adalimumab] Anaphylaxis  ? Ampicillin Hives and Itching  ? Codeine   ? Compazine [Prochlorperazine Maleate] Other (See Comments)  ?  "Stroke-like symptoms" ?Can tolerate Phenergan.  ? Cymbalta [Duloxetine Hcl] Other (See Comments)  ?  Fainting  ? Imitrex [Sumatriptan Base] Hives and Nausea And Vomiting  ? Lyrica [Pregabalin] Other (See Comments)  ?  Fainting  ? Metoclopramide Hcl Hives  ? Percocet [Oxycodone-Acetaminophen] Itching  ? Effexor [Venlafaxine  Hydrochloride] Hives and Palpitations  ? Fentanyl Nausea And Vomiting  ? Tape Itching and Rash  ? ? ?Outpatient Encounter Medications as of 11/16/2021  ?Medication Sig Note  ? albuterol (VENTOLIN HFA) 108 (90 Base) MCG/ACT inhaler Inhale 2 puffs into the lungs every 6 (six) hours as needed for wheezing or shortness of breath.   ? amitriptyline (ELAVIL) 50 MG tablet Take 1 tablet (50 mg total) by mouth at bedtime.   ? busPIRone (BUSPAR) 5 MG tablet Take 1 tablet (5 mg total) by mouth 2 (two) times daily. (Patient taking differently: Take 5 mg by mouth at bedtime.)   ? celecoxib (CELEBREX) 100 MG capsule Take 1 capsule (100 mg total) by mouth 2 (two) times daily for 7 days. To help with pain control.   ? cetirizine (ZYRTEC) 10 MG tablet Take 10 mg by mouth daily.   ? dicyclomine (BENTYL) 20 MG tablet Take 20 mg by mouth 4 (four) times daily -  before meals and at bedtime. Up to four times daily as needed 07/09/2020: Patient is taking 1-2 times daily   ? eszopiclone (LUNESTA) 2 MG TABS tablet TAKE ONE TABLET by MOUTH AT bedtime as needed FOR SLEEP. Take immediately BEFORE bedtime   ? folic acid (FOLVITE) 1 MG tablet Take 3 mg by mouth daily.    ? gabapentin (NEURONTIN) 300 MG capsule Take 1 capsule (300 mg total) by mouth 2 (two) times daily for 14 days. For 2 weeks post op for pain.   ? golimumab (SIMPONI ARIA) 50 MG/4ML SOLN injection 50 mg See admin instructions.   ? HYDROcodone-acetaminophen (NORCO) 10-325 MG tablet Take 1 tablet by mouth every 6 (six) hours as needed for up to 7 days. You may  use tylenol (acetaminophen) with this medication as long as total daily dose of tylenol does not exceed 4000 mg.   ? loperamide (IMODIUM) 2 MG capsule Take 1 capsule (2 mg total) by mouth 4 (four) times daily as needed for diarrhea or loose stools.   ? mesalamine (APRISO) 0.375 g 24 hr capsule Take 375 mg by mouth See admin instructions. 2 capsules at bedtime   ? methotrexate (50 MG/ML) 1 g injection Inject into the vein every  Monday. 0.67m every Monday   ? olmesartan (BENICAR) 20 MG tablet Take 1 tablet (20 mg total) by mouth daily.   ? omeprazole (PRILOSEC) 40 MG capsule Take 1 capsule (40 mg total) by mouth daily.   ? ondansetron (ZOFRAN) 4 MG tablet Take 1 tablet (4 mg total) by mouth every 8 (eight) hours as needed for nausea or vomiting.   ? OVER THE COUNTER MEDICATION Aleve cream for pain   ? tiZANidine (ZANAFLEX) 2 MG tablet Take 2 mg by mouth 3 (three) times daily. 07/09/2020: Patient is taking once daily at bedtime   ? Vitamin D, Ergocalciferol, (DRISDOL) 1.25 MG (50000 UNIT) CAPS capsule Take 1 capsule (50,000 Units total) by mouth 2 (two) times a week. (Patient taking differently: Take 50,000 Units by mouth 2 (two) times a week. Monday and Wednesday)   ? [DISCONTINUED] inFLIXimab (REMICADE) 100 MG injection Inject 100 mg into the vein every 30 (thirty) days. Infuse by intravenous route every 4 weeks  ?Patient is receiving Inflectra brand verses Remicade brand due to insurance (Patient not taking: Reported on 04/10/2020)   ? [DISCONTINUED] temazepam (RESTORIL) 7.5 MG capsule Take 1 capsule (7.5 mg total) by mouth at bedtime as needed for sleep. (Patient not taking: Reported on 07/09/2020)   ? ?No facility-administered encounter medications on file as of 11/16/2021.  ? ? ?Patient Active Problem List  ? Diagnosis Date Noted  ? Right-sided low back pain with right-sided sciatica 05/06/2019  ? Right sided numbness 03/27/2019  ? Paresthesia 12/25/2018  ? Essential hypertension 06/26/2018  ? Abdominal pain, chronic, right lower quadrant 01/06/2015  ? Nausea and vomiting 01/05/2015  ? Ulcerative colitis ([place] 01/05/2015  ? Enteritis   ? Nausea with vomiting   ? Intractable nausea and vomiting 04/29/2014  ? Benign paroxysmal positional vertigo 03/14/2013  ? Lymphedema of arm 12/30/2011  ? ? ?Conditions to be addressed/monitored: HTN and Crohn's Disease ;  Caregiver Stress ? ?Care Plan : Social Work Plan of Care  ?Updates made by HDaneen Schicksince 11/16/2021 12:00 AM  ?  ? ?Problem: Caregiver Stress   ?  ? ?Long-Range Goal: Caregiver Coping Optimized   ?Start Date: 09/24/2021  ?Recent Progress: On track  ?Priority: High  ?Note:   ?Current Barriers:  ?Chronic disease management support and education needs related to  HTN, Ulcerative Colitis, Vitamin D Deficiency   ?Caregiver Stress related to caring for relative with Alzheimer's Disease ? ?Social Worker Clinical Goal(s):  ?patient will work with SW to identify and address any acute and/or chronic care coordination needs related to the self health management of  HTN, Ulcerative Colitis, Vitamin D Deficiency   ?patient will work with SW to address concerns related to caregiver stress ?SW Interventions:  ?Inter-disciplinary care team collaboration (see longitudinal plan of care) ?Collaboration with MMinette BrineFNP regarding development and update of comprehensive plan of care as evidenced by provider attestation and co-signature ?Telephonic visit completed with the patient to assess goal progression ?Determined the patient fell at her home on 3.12.23  due to slipping on ice. The patient dislocated her right arm and shattered her right elbow. She underwent surgical repair 3.13.23 and has returned home ?Discussed the patients mom is stepping in to assist with her care needs and her aunt whom the patient is primary caregiver to will go stay at a family members home while the patient recovers ?Determined the patient is doing well at home at this time and will have a post-op appointment next Tuesday ?Patient reports the inpatient provider started her on Celebrex and she received a call from [facility] advising this medication will be delivered today. Patient requests all medications be delivered by [facility] ?Advised the patient SW would collaborate with PharmD to request pharmacy support ?Collaboration with PharmD Orlando Penner - see separate note indicating interventions and plan of  pharmacist ?Discussed the patient would like to speak with [place] sooner than next scheduled appointment in April if possible ?Collaboration with [place] to advise of patients re

## 2021-11-17 NOTE — Telephone Encounter (Signed)
Gabrielle Hawkins, CPP, PharmD ?Clinical Pharmacist Practitioner ?[facility] ?[phone] ? ?

## 2021-11-23 ENCOUNTER — Encounter: Payer: Self-pay | Admitting: Nurse Practitioner

## 2021-11-23 NOTE — Telephone Encounter (Signed)
Patient contacted to schedule mammogram.  ? ?RE: Mobile Mammo event located at: ? ?[facility]  [facility]  ?      ?[address]    ?[place] [address]    ? ?Date: April 7th at 2:30pm ? ?

## 2021-11-25 ENCOUNTER — Ambulatory Visit: Payer: Medicare Other

## 2021-11-25 DIAGNOSIS — K5180 Other ulcerative colitis without complications: Secondary | ICD-10-CM

## 2021-11-25 DIAGNOSIS — I10 Essential (primary) hypertension: Secondary | ICD-10-CM

## 2021-11-25 NOTE — Chronic Care Management (AMB) (Signed)
? Social Work Note ? ?11/25/2021 ?Name: Gabrielle Hawkins MRN: [id number] DOB: [birth year] ? ?Gabrielle Hawkins is a 56 y.o. year old female who is a primary care patient of [name], [place].  The Care Management team was consulted for assistance with chronic disease management and care coordination needs. ? ?Ms. Mair was given information about Care Management services today including:  ?Care Management services include personalized support from designated clinical staff supervised by her physician, including individualized plan of care and coordination with other care providers ?24/7 contact phone numbers for assistance for urgent and routine care needs. ?The patient may stop care management services at any time (effective at the end of the month) by phone call to the office staff. ? ?Patient agreed to services and consent obtained.  ? ?Engaged with patient by telephone for follow up visit in response to provider referral for social work chronic care management and care coordination services. ? ?Assessment: Review of patient past medical history, allergies, medications, and health status, including review of pertinent consultant reports was performed as part of comprehensive evaluation and provision of care management/care coordination services.  ? ?SDOH (Social Determinants of Health) assessments and interventions performed:  No ?  ? ?Advanced Directives Status: Not addressed in this encounter. ? ?Care Plan ? ?Allergies  ?Allergen Reactions  ? Humira [Adalimumab] Anaphylaxis  ? Ampicillin Hives and Itching  ? Codeine   ? Compazine [Prochlorperazine Maleate] Other (See Comments)  ?  "Stroke-like symptoms" ?Can tolerate Phenergan.  ? Cymbalta [Duloxetine Hcl] Other (See Comments)  ?  Fainting  ? Imitrex [Sumatriptan Base] Hives and Nausea And Vomiting  ? Lyrica [Pregabalin] Other (See Comments)  ?  Fainting  ? Metoclopramide Hcl Hives  ? Percocet [Oxycodone-Acetaminophen] Itching  ? Effexor [Venlafaxine  Hydrochloride] Hives and Palpitations  ? Fentanyl Nausea And Vomiting  ? Tape Itching and Rash  ? ? ?Outpatient Encounter Medications as of 11/25/2021  ?Medication Sig Note  ? albuterol (VENTOLIN HFA) 108 (90 Base) MCG/ACT inhaler Inhale 2 puffs into the lungs every 6 (six) hours as needed for wheezing or shortness of breath.   ? amitriptyline (ELAVIL) 50 MG tablet Take 1 tablet (50 mg total) by mouth at bedtime.   ? busPIRone (BUSPAR) 5 MG tablet Take 1 tablet (5 mg total) by mouth 2 (two) times daily. (Patient taking differently: Take 5 mg by mouth at bedtime.)   ? cetirizine (ZYRTEC) 10 MG tablet Take 10 mg by mouth daily.   ? dicyclomine (BENTYL) 20 MG tablet Take 20 mg by mouth 4 (four) times daily -  before meals and at bedtime. Up to four times daily as needed 07/09/2020: Patient is taking 1-2 times daily   ? eszopiclone (LUNESTA) 2 MG TABS tablet TAKE ONE TABLET by MOUTH AT bedtime as needed FOR SLEEP. Take immediately BEFORE bedtime   ? folic acid (FOLVITE) 1 MG tablet Take 3 mg by mouth daily.    ? gabapentin (NEURONTIN) 300 MG capsule Take 1 capsule (300 mg total) by mouth 2 (two) times daily for 14 days. For 2 weeks post op for pain.   ? golimumab (SIMPONI ARIA) 50 MG/4ML SOLN injection 50 mg See admin instructions.   ? loperamide (IMODIUM) 2 MG capsule Take 1 capsule (2 mg total) by mouth 4 (four) times daily as needed for diarrhea or loose stools.   ? mesalamine (APRISO) 0.375 g 24 hr capsule Take 375 mg by mouth See admin instructions. 2 capsules at bedtime   ? methotrexate (50  MG/ML) 1 g injection Inject into the vein every Monday. 0.3m every Monday   ? olmesartan (BENICAR) 20 MG tablet Take 1 tablet (20 mg total) by mouth daily.   ? omeprazole (PRILOSEC) 40 MG capsule Take 1 capsule (40 mg total) by mouth daily.   ? ondansetron (ZOFRAN) 4 MG tablet Take 1 tablet (4 mg total) by mouth every 8 (eight) hours as needed for nausea or vomiting.   ? OVER THE COUNTER MEDICATION Aleve cream for pain   ?  tiZANidine (ZANAFLEX) 2 MG tablet Take 2 mg by mouth 3 (three) times daily. 07/09/2020: Patient is taking once daily at bedtime   ? Vitamin D, Ergocalciferol, (DRISDOL) 1.25 MG (50000 UNIT) CAPS capsule Take 1 capsule (50,000 Units total) by mouth 2 (two) times a week. (Patient taking differently: Take 50,000 Units by mouth 2 (two) times a week. Monday and Wednesday)   ? [DISCONTINUED] inFLIXimab (REMICADE) 100 MG injection Inject 100 mg into the vein every 30 (thirty) days. Infuse by intravenous route every 4 weeks  ?Patient is receiving Inflectra brand verses Remicade brand due to insurance (Patient not taking: Reported on 04/10/2020)   ? [DISCONTINUED] temazepam (RESTORIL) 7.5 MG capsule Take 1 capsule (7.5 mg total) by mouth at bedtime as needed for sleep. (Patient not taking: Reported on 07/09/2020)   ? ?No facility-administered encounter medications on file as of 11/25/2021.  ? ? ?Patient Active Problem List  ? Diagnosis Date Noted  ? Right-sided low back pain with right-sided sciatica 05/06/2019  ? Right sided numbness 03/27/2019  ? Paresthesia 12/25/2018  ? Essential hypertension 06/26/2018  ? Abdominal pain, chronic, right lower quadrant 01/06/2015  ? Nausea and vomiting 01/05/2015  ? Ulcerative colitis ([place] 01/05/2015  ? Enteritis   ? Nausea with vomiting   ? Intractable nausea and vomiting 04/29/2014  ? Benign paroxysmal positional vertigo 03/14/2013  ? Lymphedema of arm 12/30/2011  ? ? ?Conditions to be addressed/monitored: HTN and Ulcerative Colitis ? ?Care Plan : Social Work Plan of Care  ?Updates made by HDaneen Schicksince 11/25/2021 12:00 AM  ?  ? ?Problem: Caregiver Stress   ?  ? ?Long-Range Goal: Caregiver Coping Optimized   ?Start Date: 09/24/2021  ?This Visit's Progress: On track  ?Recent Progress: On track  ?Priority: High  ?Note:   ?Current Barriers:  ?Chronic disease management support and education needs related to  HTN, Ulcerative Colitis, Vitamin D Deficiency   ?Caregiver Stress related to  caring for relative with Alzheimer's Disease ? ?Social Worker Clinical Goal(s):  ?patient will work with SW to identify and address any acute and/or chronic care coordination needs related to the self health management of  HTN, Ulcerative Colitis, Vitamin D Deficiency   ?patient will work with SW to address concerns related to caregiver stress ?SW Interventions:  ?Inter-disciplinary care team collaboration (see longitudinal plan of care) ?Collaboration with MMinette BrineFNP regarding development and update of comprehensive plan of care as evidenced by provider attestation and co-signature ?Telephonic visit completed with the patient to assess for care coordination needs ?Discussed the patient completed post-op appointment with her surgeon who removed the patients cast due to her hand swelling. The patient continues to wear an ace bandage and arm sling ?Determined the patient is having difficulty sleeping due to pain with positioning at night ?Encouraged the patient to prop herself with pillows to help avoid rolling to right side. Patient reports her mom bought wedge pillows that have been somewhat helpful ?Reviewed patient will undergo another surgery in  the next 3 months ?Discussed the patients aunt whom the patient cares for has been staying with an alternative family member while the patient heals ?Scheduled follow up call over the next 45 days ?Collaboration with [place] to advise of interventions and plan ? ?Patient Goals/Self-Care Activities ?patient will:  ?-Contact providers office as needed for clinical needs ?-Follow up with surgeon as needed ?-Engage with RN Care Manager regarding care management needs ? -  Contact SW as needed prior to next scheduled call ? ?Follow Up Plan: The care management team will reach out to the patient again over the next 45 days.  ? ?  ?  ? ?Follow Up Plan: SW will follow up with patient by phone over the next 45 days ?     ?[name], BSW, CDP ?Social  Worker, Certified Dementia Practitioner ?[facility] ?[phone] ? ?   ? ? ?

## 2021-11-25 NOTE — Patient Instructions (Signed)
Social Worker Visit Information ? ?Goals we discussed today:  ?Patient Goals/Self-Care Activities ?patient will:  ?-Contact providers office as needed for clinical needs ?-Follow up with surgeon as needed ?-Engage with RN Care Manager regarding care management needs ? -  Contact SW as needed prior to next scheduled call ? ? ?Patient verbalizes understanding of instructions and care plan provided today and agrees to view in [place]. Active MyChart status confirmed with patient.   ? ?Follow Up Plan: SW will follow up with patient by phone over the next 45 days ? ? ?[name], BSW, CDP ?Social Worker, Certified Dementia Practitioner ?[facility] [place] [facility] ?[phone] ? ?   ? ?

## 2021-12-03 ENCOUNTER — Ambulatory Visit: Payer: Self-pay

## 2021-12-03 ENCOUNTER — Telehealth: Payer: Medicare Other

## 2021-12-03 DIAGNOSIS — F419 Anxiety disorder, unspecified: Secondary | ICD-10-CM

## 2021-12-03 DIAGNOSIS — K5180 Other ulcerative colitis without complications: Secondary | ICD-10-CM

## 2021-12-03 DIAGNOSIS — E559 Vitamin D deficiency, unspecified: Secondary | ICD-10-CM

## 2021-12-03 DIAGNOSIS — F3289 Other specified depressive episodes: Secondary | ICD-10-CM

## 2021-12-03 DIAGNOSIS — G4700 Insomnia, unspecified: Secondary | ICD-10-CM

## 2021-12-03 DIAGNOSIS — I10 Essential (primary) hypertension: Secondary | ICD-10-CM

## 2021-12-03 NOTE — Patient Instructions (Signed)
Visit Information ? ?Thank you for taking time to visit with me today. Please don't hesitate to contact me if I can be of assistance to you before our next scheduled telephone appointment. ? ?Following are the goals we discussed today:  ?(Copy and paste patient goals from clinical care plan here) ? ?Our next appointment is by telephone on 01/10/22 at 12:45 PM  ? ?Please call the care guide team at [phone] if you need to cancel or reschedule your appointment.  ? ?If you are experiencing a Mental Health or [place] or need someone to talk to, please call 1-800-273-TALK (toll free, 24 hour hotline)  ? ?Patient verbalizes understanding of instructions and care plan provided today and agrees to view in [place]. Active MyChart status confirmed with patient.   ? ?Barb Merino, RN, BSN, CCM ?Care Management Coordinator ?[facility]  ?Direct Phone: [phone] ? ? ?

## 2021-12-03 NOTE — Progress Notes (Signed)
This encounter was created in error - please disregard.

## 2021-12-03 NOTE — Chronic Care Management (AMB) (Signed)
?Chronic Care Management  ? ?CCM RN Visit Note ? ?12/03/2021 ?Name: Gabrielle Hawkins MRN: [id number] DOB: 1965-11-20 ? ?Subjective: ?Gabrielle Hawkins is a 56 y.o. year old female who is a primary care patient of [name], [place]. The care management team was consulted for assistance with disease management and care coordination needs.   ? ?Engaged with patient by telephone for follow up visit in response to provider referral for case management and/or care coordination services.  ? ?Consent to Services:  ?The patient was given information about Chronic Care Management services, agreed to services, and gave verbal consent prior to initiation of services.  Please see initial visit note for detailed documentation.  ? ?Patient agreed to services and verbal consent obtained.  ? ?Assessment: Review of patient past medical history, allergies, medications, health status, including review of consultants reports, laboratory and other test data, was performed as part of comprehensive evaluation and provision of chronic care management services.  ? ?SDOH (Social Determinants of Health) assessments and interventions performed: yes, no acute changes   ? ?CCM Care Plan ? ?Allergies  ?Allergen Reactions  ? Humira [Adalimumab] Anaphylaxis  ? Ampicillin Hives and Itching  ? Codeine   ? Compazine [Prochlorperazine Maleate] Other (See Comments)  ?  "Stroke-like symptoms" ?Can tolerate Phenergan.  ? Cymbalta [Duloxetine Hcl] Other (See Comments)  ?  Fainting  ? Imitrex [Sumatriptan Base] Hives and Nausea And Vomiting  ? Lyrica [Pregabalin] Other (See Comments)  ?  Fainting  ? Metoclopramide Hcl Hives  ? Percocet [Oxycodone-Acetaminophen] Itching  ? Effexor [Venlafaxine Hydrochloride] Hives and Palpitations  ? Fentanyl Nausea And Vomiting  ? Tape Itching and Rash  ? ? ?Outpatient Encounter Medications as of 12/03/2021  ?Medication Sig Note  ? amitriptyline (ELAVIL) 50 MG tablet Take 1 tablet (50 mg total) by mouth at bedtime.   ?  busPIRone (BUSPAR) 5 MG tablet Take 1 tablet (5 mg total) by mouth 2 (two) times daily. (Patient taking differently: Take 5 mg by mouth at bedtime.)   ? cetirizine (ZYRTEC) 10 MG tablet Take 10 mg by mouth daily.   ? dicyclomine (BENTYL) 20 MG tablet Take 20 mg by mouth 4 (four) times daily -  before meals and at bedtime. Up to four times daily as needed 07/09/2020: Patient is taking 1-2 times daily   ? docusate sodium (COLACE) 100 MG capsule Take 100 mg by mouth daily.   ? eszopiclone (LUNESTA) 2 MG TABS tablet TAKE ONE TABLET by MOUTH AT bedtime as needed FOR SLEEP. Take immediately BEFORE bedtime   ? golimumab (SIMPONI ARIA) 50 MG/4ML SOLN injection 50 mg See admin instructions. Infusion every 8 weeks, [facility]   ? HYDROcodone-acetaminophen (NORCO) 10-325 MG tablet Take 1-2 tablets by mouth every 6 (six) hours as needed for moderate pain.   ? ibuprofen (ADVIL) 200 MG tablet Take 200 mg by mouth every 6 (six) hours as needed for moderate pain.   ? mesalamine (APRISO) 0.375 g 24 hr capsule Take 375 mg by mouth See admin instructions. 1 capsule at bedtime   ? olmesartan (BENICAR) 20 MG tablet Take 1 tablet (20 mg total) by mouth daily.   ? omeprazole (PRILOSEC) 40 MG capsule Take 1 capsule (40 mg total) by mouth daily.   ? polyethylene glycol (MIRALAX / GLYCOLAX) 17 g packet Take 17 g by mouth daily as needed.   ? tiZANidine (ZANAFLEX) 2 MG tablet Take 2 mg by mouth 3 (three) times daily. 07/09/2020: Patient is taking once daily at  bedtime   ? Vitamin D, Ergocalciferol, (DRISDOL) 1.25 MG (50000 UNIT) CAPS capsule Take 1 capsule (50,000 Units total) by mouth 2 (two) times a week. (Patient taking differently: Take 50,000 Units by mouth 2 (two) times a week. Monday and Wednesday)   ? albuterol (VENTOLIN HFA) 108 (90 Base) MCG/ACT inhaler Inhale 2 puffs into the lungs every 6 (six) hours as needed for wheezing or shortness of breath. (Patient not taking: Reported on 12/03/2021)   ? folic acid (FOLVITE) 1 MG  tablet Take 3 mg by mouth daily.  (Patient not taking: Reported on 12/03/2021)   ? gabapentin (NEURONTIN) 300 MG capsule Take 1 capsule (300 mg total) by mouth 2 (two) times daily for 14 days. For 2 weeks post op for pain.   ? loperamide (IMODIUM) 2 MG capsule Take 1 capsule (2 mg total) by mouth 4 (four) times daily as needed for diarrhea or loose stools. (Patient not taking: Reported on 12/03/2021)   ? methotrexate (50 MG/ML) 1 g injection Inject into the vein every Monday. 0.55m every Monday (Patient not taking: Reported on 12/03/2021)   ? ondansetron (ZOFRAN) 4 MG tablet Take 1 tablet (4 mg total) by mouth every 8 (eight) hours as needed for nausea or vomiting. (Patient not taking: Reported on 12/03/2021)   ? OVER THE COUNTER MEDICATION Aleve cream for pain   ? [DISCONTINUED] inFLIXimab (REMICADE) 100 MG injection Inject 100 mg into the vein every 30 (thirty) days. Infuse by intravenous route every 4 weeks  ?Patient is receiving Inflectra brand verses Remicade brand due to insurance (Patient not taking: Reported on 04/10/2020)   ? [DISCONTINUED] temazepam (RESTORIL) 7.5 MG capsule Take 1 capsule (7.5 mg total) by mouth at bedtime as needed for sleep. (Patient not taking: Reported on 07/09/2020)   ? ?No facility-administered encounter medications on file as of 12/03/2021.  ? ? ?Patient Active Problem List  ? Diagnosis Date Noted  ? Right-sided low back pain with right-sided sciatica 05/06/2019  ? Right sided numbness 03/27/2019  ? Paresthesia 12/25/2018  ? Essential hypertension 06/26/2018  ? Abdominal pain, chronic, right lower quadrant 01/06/2015  ? Nausea and vomiting 01/05/2015  ? Ulcerative colitis ([facility] 01/05/2015  ? Enteritis   ? Nausea with vomiting   ? Intractable nausea and vomiting 04/29/2014  ? Benign paroxysmal positional vertigo 03/14/2013  ? Lymphedema of arm 12/30/2011  ? ? ?Conditions to be addressed/monitored: Ulcerative Colitis, Vitamin D deficiency ? ?Care Plan : RN Care Manager Plan of Care  ?Updates  made by LLynne Logan RN since 12/03/2021 12:00 AM  ?  ? ?Problem: No plan established for management of chronic disease states (Ulcerative Colitis, Vitamin D deficiency)   ?Priority: High  ?  ? ?Long-Range Goal: Development of plan of care for chronic disease management for Ulcerative Colitis, Vitamin D deficiency   ?Start Date: 07/13/2021  ?Expected End Date: 07/14/2023  ?Recent Progress: On track  ?Priority: High  ?Note:   ?Current Barriers:  ?Knowledge Deficits related to plan of care for management of Ulcerative Colitis, Vitamin D deficiency ?Chronic Disease Management support and education needs related to Ulcerative Colitis, Vitamin D deficiency ?Caregiver Stress  ? ?RNCM Clinical Goal(s):  ?Patient will verbalize basic understanding of  Ulcerative Colitis, Vitamin D deficiency disease process and self health management plan   ?take all medications exactly as prescribed and will call provider for medication related questions ?demonstrate Ongoing health management independence   ?continue to work with RN Care Manager to address care management and care  coordination needs related to  Ulcerative Colitis, Vitamin D deficiency ?will demonstrate ongoing self health care management ability    through collaboration with RN Care manager, provider, and care team.  ? ?Interventions: ?1:1 collaboration with primary care provider regarding development and update of comprehensive plan of care as evidenced by provider attestation and co-signature ?Inter-disciplinary care team collaboration (see longitudinal plan of care) ?Evaluation of current treatment plan related to  self management and patient's adherence to plan as established by provider ? ?Ulcerative Colitis Interventions: Status: (Condition stable.  Not addressed this visit.) Long Term Goal  ?Evaluation of current treatment plan related to  Ulcerative Colitis , self-management and patient's adherence to plan as established by provider. ?Discussed plans with patient  for ongoing care management follow up and provided patient with direct contact information for care management team ?Reviewed medications with patient and discussed indication, dosage and frequency of

## 2021-12-09 NOTE — Telephone Encounter (Signed)
Confirmed appt for mobile mammo.  ?

## 2021-12-10 ENCOUNTER — Telehealth: Payer: Medicare Other

## 2021-12-28 ENCOUNTER — Telehealth: Payer: Medicare Other

## 2021-12-29 ENCOUNTER — Ambulatory Visit: Payer: Medicare Other

## 2021-12-29 DIAGNOSIS — I10 Essential (primary) hypertension: Secondary | ICD-10-CM

## 2021-12-29 DIAGNOSIS — K5180 Other ulcerative colitis without complications: Secondary | ICD-10-CM

## 2021-12-29 DIAGNOSIS — E559 Vitamin D deficiency, unspecified: Secondary | ICD-10-CM

## 2021-12-29 NOTE — Patient Instructions (Signed)
Visit Information ? ?Thank you for taking time to visit with me today. Please don't hesitate to contact me if I can be of assistance to you before our next scheduled telephone appointment. ? ?Following are the goals we discussed today:  ?Patient Goals/Self-Care Activities ?patient will:  ?-Contact surgeons office to review payment options ?-Contact [facility] to follow up on the status of financial assistance application ?-Engage with RN Care Manager regarding care management needs ? - Contact SW as needed prior to next scheduled call ? ?Our next appointment is by telephone on 01/19/22 ? ?Please call the care guide team at [phone] if you need to cancel or reschedule your appointment.  ? ?If you are experiencing a Mental Health or [place] or need someone to talk to, please call the Suicide and Crisis Lifeline: 988  ? ?Patient verbalizes understanding of instructions and care plan provided today and agrees to view in [place]. Active MyChart status confirmed with patient.   ? ?The care management team will reach out to the patient again over the next 45 days.  ? ?[name], BSW, CDP ?Social Worker, Certified Dementia Practitioner ?[facility] ?[phone] ? ?   ?  ?

## 2021-12-29 NOTE — Chronic Care Management (AMB) (Signed)
? Social Work Note ? ?12/29/2021 ?Name: MICAILA ZIEMBA MRN: [id number] DOB: [birth year] ? ?FORTUNATA BETTY is a 56 y.o. year old female who is a primary care patient of [name], [place].  The Care Management team was consulted for assistance with chronic disease management and care coordination needs. ? ?Ms. Maxfield was given information about Care Management services today including:  ?Care Management services include personalized support from designated clinical staff supervised by her physician, including individualized plan of care and coordination with other care providers ?24/7 contact phone numbers for assistance for urgent and routine care needs. ?The patient may stop care management services at any time (effective at the end of the month) by phone call to the office staff. ? ?Patient agreed to services and consent obtained.  ? ?Engaged with patient by telephone for follow up visit in response to provider referral for social work chronic care management and care coordination services. ? ?Assessment: Review of patient past medical history, allergies, medications, and health status, including review of pertinent consultant reports was performed as part of comprehensive evaluation and provision of care management/care coordination services.  ? ?SDOH (Social Determinants of Health) assessments and interventions performed:  No ?  ? ?Advanced Directives Status: Not addressed in this encounter. ? ?Care Plan ? ?Allergies  ?Allergen Reactions  ? Humira [Adalimumab] Anaphylaxis  ? Ampicillin Hives and Itching  ? Codeine   ? Compazine [Prochlorperazine Maleate] Other (See Comments)  ?  "Stroke-like symptoms" ?Can tolerate Phenergan.  ? Cymbalta [Duloxetine Hcl] Other (See Comments)  ?  Fainting  ? Imitrex [Sumatriptan Base] Hives and Nausea And Vomiting  ? Lyrica [Pregabalin] Other (See Comments)  ?  Fainting  ? Metoclopramide Hcl Hives  ? Percocet [Oxycodone-Acetaminophen] Itching  ? Effexor [Venlafaxine  Hydrochloride] Hives and Palpitations  ? Fentanyl Nausea And Vomiting  ? Tape Itching and Rash  ? ? ?Outpatient Encounter Medications as of 12/29/2021  ?Medication Sig Note  ? albuterol (VENTOLIN HFA) 108 (90 Base) MCG/ACT inhaler Inhale 2 puffs into the lungs every 6 (six) hours as needed for wheezing or shortness of breath. (Patient not taking: Reported on 12/03/2021)   ? amitriptyline (ELAVIL) 50 MG tablet Take 1 tablet (50 mg total) by mouth at bedtime.   ? busPIRone (BUSPAR) 5 MG tablet Take 1 tablet (5 mg total) by mouth 2 (two) times daily. (Patient taking differently: Take 5 mg by mouth at bedtime.)   ? cetirizine (ZYRTEC) 10 MG tablet Take 10 mg by mouth daily.   ? dicyclomine (BENTYL) 20 MG tablet Take 20 mg by mouth 4 (four) times daily -  before meals and at bedtime. Up to four times daily as needed 07/09/2020: Patient is taking 1-2 times daily   ? docusate sodium (COLACE) 100 MG capsule Take 100 mg by mouth daily.   ? eszopiclone (LUNESTA) 2 MG TABS tablet TAKE ONE TABLET by MOUTH AT bedtime as needed FOR SLEEP. Take immediately BEFORE bedtime   ? folic acid (FOLVITE) 1 MG tablet Take 3 mg by mouth daily.  (Patient not taking: Reported on 12/03/2021)   ? gabapentin (NEURONTIN) 300 MG capsule Take 1 capsule (300 mg total) by mouth 2 (two) times daily for 14 days. For 2 weeks post op for pain.   ? golimumab (SIMPONI ARIA) 50 MG/4ML SOLN injection 50 mg See admin instructions. Infusion every 8 weeks, [facility]   ? HYDROcodone-acetaminophen (NORCO) 10-325 MG tablet Take 1-2 tablets by mouth every 6 (six) hours as needed for moderate  pain.   ? ibuprofen (ADVIL) 200 MG tablet Take 200 mg by mouth every 6 (six) hours as needed for moderate pain.   ? loperamide (IMODIUM) 2 MG capsule Take 1 capsule (2 mg total) by mouth 4 (four) times daily as needed for diarrhea or loose stools. (Patient not taking: Reported on 12/03/2021)   ? mesalamine (APRISO) 0.375 g 24 hr capsule Take 375 mg by mouth See admin  instructions. 1 capsule at bedtime   ? methotrexate (50 MG/ML) 1 g injection Inject into the vein every Monday. 0.65m every Monday (Patient not taking: Reported on 12/03/2021)   ? olmesartan (BENICAR) 20 MG tablet Take 1 tablet (20 mg total) by mouth daily.   ? omeprazole (PRILOSEC) 40 MG capsule Take 1 capsule (40 mg total) by mouth daily.   ? ondansetron (ZOFRAN) 4 MG tablet Take 1 tablet (4 mg total) by mouth every 8 (eight) hours as needed for nausea or vomiting. (Patient not taking: Reported on 12/03/2021)   ? OVER THE COUNTER MEDICATION Aleve cream for pain   ? polyethylene glycol (MIRALAX / GLYCOLAX) 17 g packet Take 17 g by mouth daily as needed.   ? tiZANidine (ZANAFLEX) 2 MG tablet Take 2 mg by mouth 3 (three) times daily. 07/09/2020: Patient is taking once daily at bedtime   ? Vitamin D, Ergocalciferol, (DRISDOL) 1.25 MG (50000 UNIT) CAPS capsule Take 1 capsule (50,000 Units total) by mouth 2 (two) times a week. (Patient taking differently: Take 50,000 Units by mouth 2 (two) times a week. Monday and Wednesday)   ? [DISCONTINUED] inFLIXimab (REMICADE) 100 MG injection Inject 100 mg into the vein every 30 (thirty) days. Infuse by intravenous route every 4 weeks  ?Patient is receiving Inflectra brand verses Remicade brand due to insurance (Patient not taking: Reported on 04/10/2020)   ? [DISCONTINUED] temazepam (RESTORIL) 7.5 MG capsule Take 1 capsule (7.5 mg total) by mouth at bedtime as needed for sleep. (Patient not taking: Reported on 07/09/2020)   ? ?No facility-administered encounter medications on file as of 12/29/2021.  ? ? ?Patient Active Problem List  ? Diagnosis Date Noted  ? Right-sided low back pain with right-sided sciatica 05/06/2019  ? Right sided numbness 03/27/2019  ? Paresthesia 12/25/2018  ? Essential hypertension 06/26/2018  ? Abdominal pain, chronic, right lower quadrant 01/06/2015  ? Nausea and vomiting 01/05/2015  ? Ulcerative colitis ([facility] 01/05/2015  ? Enteritis   ? Nausea with vomiting    ? Intractable nausea and vomiting 04/29/2014  ? Benign paroxysmal positional vertigo 03/14/2013  ? Lymphedema of arm 12/30/2011  ? ? ?Conditions to be addressed/monitored:  HTN, Ulcerative Colitis, Vitamin D Deficiency ? ?Care Plan : Social Work Plan of Care  ?Updates made by HDaneen Schicksince 12/29/2021 12:00 AM  ?  ? ?Problem: Caregiver Stress   ?  ? ?Long-Range Goal: Caregiver Coping Optimized   ?Start Date: 09/24/2021  ?Recent Progress: On track  ?Priority: High  ?Note:   ?Current Barriers:  ?Chronic disease management support and education needs related to  HTN, Ulcerative Colitis, Vitamin D Deficiency   ?Caregiver Stress related to caring for relative with Alzheimer's Disease ? ?Social Worker Clinical Goal(s):  ?patient will work with SW to identify and address any acute and/or chronic care coordination needs related to the self health management of  HTN, Ulcerative Colitis, Vitamin D Deficiency   ?patient will work with SW to address concerns related to caregiver stress ?SW Interventions:  ?Inter-disciplinary care team collaboration (see longitudinal plan of care) ?  Collaboration with [name] FNP regarding development and update of comprehensive plan of care as evidenced by provider attestation and co-signature ?Telephonic visit completed with the patient to assess for care coordination needs ?Discussed the patient has an upcoming surgery scheduled on 01/13/22, she is concerned with the ability to cover the cost of this surgery due to only have Medicare Part A ?Determined the patient would like SW to assist her with obtaining Medicare Part B without the out of pocket costs ?Advised the patient this SW is unaware of how she may obtain Medicare Part B without paying out of pocket considering she no longer has disability and is under the age of 27 ?Discussed the patient has applied for the [facility] but has not received a determination. Patient is concerned she will not be  able to participate in the schedule surgery due to cost ?Encouraged the patient to reach out to her surgeons office to request information on payment plans for upcomming surgery ?Advised the patient to reach o

## 2022-01-05 ENCOUNTER — Ambulatory Visit (INDEPENDENT_AMBULATORY_CARE_PROVIDER_SITE_OTHER): Payer: 59 | Admitting: Internal Medicine

## 2022-01-05 ENCOUNTER — Encounter: Payer: Self-pay | Admitting: Internal Medicine

## 2022-01-05 VITALS — BP 134/88 | HR 86 | Ht 68.5 in | Wt 195.8 lb

## 2022-01-05 DIAGNOSIS — I517 Cardiomegaly: Secondary | ICD-10-CM | POA: Diagnosis not present

## 2022-01-05 NOTE — Progress Notes (Signed)
?Cardiology Office Note:   ? ?Date:  01/05/2022  ? ?ID:  Gabrielle Hawkins, DOB 18-Mar-1966, MRN [id number] ? ?PCP:  [name], FNP ?  ?[facility] Providers ?Cardiologist:  Janina Mayo, MD    ? ?Referring MD: Glendale Chard, MD  ? ?No chief complaint on file. ?LVH ? ?History of Present Illness:   ? ?Gabrielle Hawkins is a 56 y.o. female with a hx below, breast cancer adriamycin, cytoxan, 2006 and radiation/right breast s/p lumpectomy and removal of lymph nodes and she is in remission, IBS, referral for LVH ? ?She states that she went to the ED and having mild chest pains with her arm and shoulder. She feels more pain in her shoulder and her arm. It radiates around the breast. The duration has been 2 weeks. No provocation. Persistent dull and sharp pain.  She takes motrin and it can helps. 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 She has had some dizziness and has fainted. She stood up and went to the bathroom and fainted. She has mild hypertension. She was on prednisone. She had some swelling in the legs when she was working but not anymore. She is primarily taking care of her aunt. She gets winded with exertional activities and she rests when she needs to. She can walk up a flight of stairs ok. She denies PND, orthopnea. She has no hx ot DM2. No smoking history.  ? ?Her father has an ICD for CHF, noted significant bradycardia as well.  and her mother has CHF. No known diagnosis of hypertrophic cardiomyopathy. No family hx of sudden cardiac death.  ? ?She has hx of breast cancer in 2006 managed here. She was on doxorubicin. No hx of cardiotoxicity. XRT was to the right breast. S/p tamoxifen. She's in remission. ? ? ?Interim Hx: ?EF was borderline low. Otherwise was normal.  She fell has had some arm surgeries. ? ? ?Past Medical History:  ?Diagnosis Date  ? Anemia 1998  ? Anxiety   ? Arthritis   ? big toe   ? Asthma   ? BRCA1 negative   ? BRCA2 negative   ? Breast cancer ([place]) 2006  ?  chem/radiation/2years tamoxifen  ? Chronic neck and back pain   ? Crohn's disease ([place])   ? Depression   ? Endometriosis   ? Fibroid   ? GERD (gastroesophageal reflux disease)   ? History of chemotherapy 01/2005  ? Hx of tamoxifen therapy   ? Hx of transfusion of packed red blood cells   ? IBS (irritable bowel syndrome)   ? Kidney stones   ? Lymphedema of arm   ? Migraines   ? just at dx of breast CA  ? 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 Neuropathy   ? Paresthesias   ? Peripheral neuropathy   ? Radiation 04/2005  ? Ulcerative colitis ([place])   ? with chronic diarrhea  ? Urinary tract infection   ? x 3  ? Vertigo 2014  ? Vitamin D deficiency   ? ? ?Past Surgical History:  ?Procedure Laterality Date  ? ABDOMINAL HYSTERECTOMY  2007  ? TAH/BSO  ? BREAST SURGERY  2006  ? LUMPECTOMY  ? CHOLECYSTECTOMY    ? CHOLECYSTECTOMY N/A 04/29/2014  ? Procedure: LAPAROSCOPIC CHOLECYSTECTOMY WITH INTRAOPERATIVE CHOLANGIOGRAM;  Surgeon: Edward Jolly, MD;  [facility];  Service: General;  Laterality: N/A;  ? COLONOSCOPY  June 2013  ? Dr. Benson Norway: sessile serrated  adenoma  ? COLONOSCOPY WITH PROPOFOL N/A 03/13/2015  ? Procedure: COLONOSCOPY WITH PROPOFOL;  Surgeon: [name], MD;  [facility];  Service: Endoscopy;  Laterality: N/A;  ? DILATION AND CURETTAGE OF UTERUS    ? ELBOW LIGAMENT RECONSTRUCTION Right 11/15/2021  ? Procedure: ELBOW LIGAMENT REPAIR/ LCL;  Surgeon: Hiram Gash, MD;  Location: [place];  Service: Orthopedics;  Laterality: Right;  Regional block  ? FLEXIBLE SIGMOIDOSCOPY N/A 07/18/2014  ? Dr. Benson Norway: mild left-sided colitis, biopsies with quiescent UC  ? MYOMECTOMY    ? ORIF RADIAL FRACTURE Right 11/15/2021  ? Procedure: RIGHT RADIAL HEAD ARTHROPLASTY;  Surgeon: Hiram Gash, MD;  Location: [place];  Service: Orthopedics;  Laterality: Right;  Regional block  ? ORIF ULNAR FRACTURE Right 11/15/2021  ? Procedure: OPEN REDUCTION INTERNAL FIXATION (ORIF) RIGHT ULNAR  FRACTURE;  Surgeon: Hiram Gash, MD;  Location: [place];  Service: Orthopedics;  Laterality: Right;  Regional block  ? TONSILLECTOMY    ? ? ?Current Medications: ?Current Meds  ?Medication Sig  ? albuterol (VENTOLIN HFA) 108 (90 Base) MCG/ACT inhaler Inhale 2 puffs into the lungs every 6 (six) hours as needed for wheezing or shortness of breath.  ? amitriptyline (ELAVIL) 50 MG tablet Take 1 tablet (50 mg total) by mouth at bedtime.  ? busPIRone (BUSPAR) 5 MG tablet Take 1 tablet (5 mg total) by mouth 2 (two) times daily. (Patient taking differently: Take 5 mg by mouth at bedtime.)  ? cetirizine (ZYRTEC) 10 MG tablet Take 10 mg by mouth daily.  ? dicyclomine (BENTYL) 20 MG tablet Take 20 mg by mouth 4 (four) times daily -  before meals and at bedtime. Up to four times daily as needed  ? docusate sodium (COLACE) 100 MG capsule Take 100 mg by mouth daily.  ? eszopiclone (LUNESTA) 2 MG TABS tablet TAKE ONE TABLET by MOUTH AT bedtime as needed FOR SLEEP. Take immediately BEFORE bedtime  ? folic acid (FOLVITE) 1 MG tablet Take 3 mg by mouth daily.  ? gabapentin (NEURONTIN) 300 MG capsule Take 1 capsule (300 mg total) by mouth 2 (two) times daily for 14 days. For 2 weeks post op for pain.  ? golimumab (SIMPONI ARIA) 50 MG/4ML SOLN injection 50 mg See admin instructions. Infusion every 8 weeks, [place] [facility]  ? HYDROcodone-acetaminophen (NORCO) 10-325 MG tablet Take 1-2 tablets by mouth every 6 (six) hours as needed for moderate pain.  ? ibuprofen (ADVIL) 200 MG tablet Take 200 mg by mouth every 6 (six) hours as needed for moderate pain.  ? methotrexate (50 MG/ML) 1 g injection Inject into the vein every Monday. 0.60m every Monday  ? olmesartan (BENICAR) 20 MG tablet Take 1 tablet (20 mg total) by mouth daily.  ? omeprazole (PRILOSEC) 40 MG capsule Take 1 capsule (40 mg total) by mouth daily.  ? ondansetron (ZOFRAN) 4 MG tablet Take 1 tablet (4 mg total) by mouth every 8 (eight) hours as needed  for nausea or vomiting.  ? OVER THE COUNTER MEDICATION Aleve cream for pain  ? polyethylene glycol (MIRALAX / GLYCOLAX) 17 g packet Take 17 g by mouth daily as needed.  ? tiZANidine (ZANAFLEX) 2 MG tablet Take 2 mg by mouth 3 (three) times daily.  ? Vitamin D, Ergocalciferol, (DRISDOL) 1.25 MG (50000 UNIT) CAPS capsule Take 1 capsule (50,000 Units total) by mouth 2 (two) times a week. (Patient taking differently: Take 50,000 Units by mouth 2 (two) times a  week. Monday and Wednesday)  ?  ? ?Allergies:   Humira [adalimumab], Ampicillin, Codeine, Compazine [prochlorperazine maleate], Cymbalta [duloxetine hcl], Imitrex [sumatriptan base], Lyrica [pregabalin], Metoclopramide hcl, Percocet [oxycodone-acetaminophen], Effexor [venlafaxine hydrochloride], Fentanyl, and Tape  ? ?Social History  ? ?Socioeconomic History  ? Marital status: Single  ?  Spouse name: Not on file  ? Number of children: Not on file  ? Years of education: Not on file  ? Highest education level: Not on file  ?Occupational History  ? Occupation: disability  ?  Employer: [employer]  ?  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 Smoking status: Never  ? Smokeless tobacco: Never  ?Vaping Use  ? Vaping Use: Never used  ?Substance and Sexual Activity  ? Alcohol use: No  ? Drug use: Yes  ?  Types: Marijuana  ? Sexual activity: Not Currently  ?  Birth control/protection: None, Surgical  ?  Comment: HYST  ?Other Topics Concern  ? Not on file  ?Social History Narrative  ? Patient lives at home with her nephew.   ? Patient has 2 years of college education.   ? Patient has 0 children.   ? Patient has a boyfriend.   ?   ?   ? ?Social Determinants of Health  ? ?Financial Resource Strain: Not on file  ?Food Insecurity: No Food Insecurity  ? Worried About Charity fundraiser in the Last Year: Never true  ? Ran Out of Food in the Last Year: Never true  ?Transportation Needs: No Transportation Needs  ? Lack of Transportation (Medical): No  ? Lack of Transportation  (Non-Medical): No  ?Physical Activity: Not on file  ?Stress: Not on file  ?Social Connections: Not on file  ?  ? ?Family History: ?The patient's family history includes Breast cancer (age of onset: 27)

## 2022-01-05 NOTE — Patient Instructions (Signed)
Medication Instructions:  ?No Changes In Medications at this time.  ?*If you need a refill on your cardiac medications before your next appointment, please call your pharmacy* ? ?Lab Work: ?None Ordered At This Time.  ?If you have labs (blood work) drawn today and your tests are completely normal, you will receive your results only by: ?MyChart Message (if you have MyChart) OR ?A paper copy in the mail ?If you have any lab test that is abnormal or we need to change your treatment, we will call you to review the results. ? ?Testing/Procedures: ?None Ordered At This Time.  ? ?Follow-Up: ?At [facility], you and your health needs are our priority.  As part of our continuing mission to provide you with exceptional heart care, we have created designated Provider Care Teams.  These Care Teams include your primary Cardiologist (physician) and Advanced Practice Providers (APPs -  Physician Assistants and Nurse Practitioners) who all work together to provide you with the care you need, when you need it. ? ?Your next appointment:   ?6 month(s) ? ?The format for your next appointment:   ?In Person ? ?Provider:   ?Janina Mayo, MD   ?

## 2022-01-06 ENCOUNTER — Encounter (HOSPITAL_BASED_OUTPATIENT_CLINIC_OR_DEPARTMENT_OTHER): Payer: Self-pay | Admitting: Orthopaedic Surgery

## 2022-01-06 ENCOUNTER — Other Ambulatory Visit: Payer: Self-pay

## 2022-01-06 ENCOUNTER — Encounter: Payer: 59 | Admitting: Nurse Practitioner

## 2022-01-06 NOTE — H&P (Signed)
? ? ?PREOPERATIVE H&P ? ?Chief Complaint: RIGHT ELBOW ULNAR NERVER LESION,HARDWARE COMPLICATIONS ? ?HPI: ?Gabrielle Hawkins is a 56 y.o. female who is scheduled for, Procedure(s): ?ANTERIOR INTEROSSEOUS NERVE DECOMPRESSION ?HARDWARE REMOVAL/ELBOW.  ? ?Patient has a past medical history significant for IBS, breast cancer, HTN, ulcerative colitis.  ? ?Patient had a elbow fracture dislocation on 11/14/2021. She underwent a right elbow open treatment of dislocation, radial head arthroplasty, coronoid ORIF, lateral ligament repair, and internal fixator placement on 3/13. She did very well with this. At this point it is time to remove her internal fixator.  ? ?Symptoms are rated as moderate to severe, and have been worsening.  This is significantly impairing activities of daily living.   ? ?Please see clinic note for further details on this patient's care.   ? ?She has elected for surgical management.  ? ?Past Medical History:  ?Diagnosis Date  ? Anemia 1998  ? Anxiety   ? Arthritis   ? big toe   ? Asthma   ? BRCA1 negative   ? BRCA2 negative   ? Breast cancer ([place]) 2006  ? chem/radiation/2years tamoxifen  ? Chronic neck and back pain   ? Crohn's disease ([place])   ? Depression   ? Endometriosis   ? Fibroid   ? GERD (gastroesophageal reflux disease)   ? History of chemotherapy 01/2005  ? Hx of tamoxifen therapy   ? Hx of transfusion of packed red blood cells   ? Hypertension   ? IBS (irritable bowel syndrome)   ? Kidney stones   ? Lymphedema of arm   ? Migraines   ? just at dx of breast CA  ? 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 Neuropathy   ? Paresthesias   ? Peripheral neuropathy   ? Radiation 04/2005  ? Ulcerative colitis ([place])   ? with chronic diarrhea  ? Urinary tract infection   ? x 3  ? Vertigo 2014  ? Vitamin D deficiency   ? ?Past Surgical History:  ?Procedure Laterality Date  ? ABDOMINAL HYSTERECTOMY  2007  ? TAH/BSO  ? BREAST SURGERY  2006  ? LUMPECTOMY  ? CHOLECYSTECTOMY    ? CHOLECYSTECTOMY N/A 04/29/2014   ? Procedure: LAPAROSCOPIC CHOLECYSTECTOMY WITH INTRAOPERATIVE CHOLANGIOGRAM;  Surgeon: Edward Jolly, MD;  [facility];  Service: General;  Laterality: N/A;  ? COLONOSCOPY  June 2013  ? Dr. Benson Norway: sessile serrated adenoma  ? COLONOSCOPY WITH PROPOFOL N/A 03/13/2015  ? Procedure: COLONOSCOPY WITH PROPOFOL;  Surgeon: [name], MD;  [facility];  Service: Endoscopy;  Laterality: N/A;  ? DILATION AND CURETTAGE OF UTERUS    ? ELBOW LIGAMENT RECONSTRUCTION Right 11/15/2021  ? Procedure: ELBOW LIGAMENT REPAIR/ LCL;  Surgeon: Hiram Gash, MD;  Location: [place];  Service: Orthopedics;  Laterality: Right;  Regional block  ? FLEXIBLE SIGMOIDOSCOPY N/A 07/18/2014  ? Dr. Benson Norway: mild left-sided colitis, biopsies with quiescent UC  ? MYOMECTOMY    ? ORIF RADIAL FRACTURE Right 11/15/2021  ? Procedure: RIGHT RADIAL HEAD ARTHROPLASTY;  Surgeon: Hiram Gash, MD;  Location: [place];  Service: Orthopedics;  Laterality: Right;  Regional block  ? ORIF ULNAR FRACTURE Right 11/15/2021  ? Procedure: OPEN REDUCTION INTERNAL FIXATION (ORIF) RIGHT ULNAR FRACTURE;  Surgeon: Hiram Gash, MD;  Location: [place];  Service: Orthopedics;  Laterality: Right;  Regional block  ? TONSILLECTOMY    ? ?Social History  ? ?Socioeconomic History  ?  Marital status: Single  ?  Spouse name: Not on file  ? Number of children: Not on file  ? Years of education: Not on file  ? Highest education level: Not on file  ?Occupational History  ? Occupation: disability  ?  Employer: [employer]  ?  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 Smoking status: Never  ? Smokeless tobacco: Never  ?Vaping Use  ? Vaping Use: Never used  ?Substance and Sexual Activity  ? Alcohol use: No  ? Drug use: Yes  ?  Types: Marijuana  ? Sexual activity: Not Currently  ?  Birth control/protection: None, Surgical  ?  Comment: HYST  ?Other Topics Concern  ? Not on file  ?Social History Narrative  ? Patient lives  at home with her nephew.   ? Patient has 2 years of college education.   ? Patient has 0 children.   ? Patient has a boyfriend.   ?   ?   ? ?Social Determinants of Health  ? ?Financial Resource Strain: Not on file  ?Food Insecurity: No Food Insecurity  ? Worried About Charity fundraiser in the Last Year: Never true  ? Ran Out of Food in the Last Year: Never true  ?Transportation Needs: No Transportation Needs  ? Lack of Transportation (Medical): No  ? Lack of Transportation (Non-Medical): No  ?Physical Activity: Not on file  ?Stress: Not on file  ?Social Connections: Not on file  ? ?Family History  ?Problem Relation Age of Onset  ? Breast cancer Mother 88  ? Heart failure Father   ? Heart disease Father   ? Cancer Maternal Grandmother   ?     COLON  ? Heart failure Paternal Grandmother   ? Heart disease Paternal Grandmother   ? ?Allergies  ?Allergen Reactions  ? Humira [Adalimumab] Anaphylaxis  ? Ampicillin Hives and Itching  ? Codeine   ? Compazine [Prochlorperazine Maleate] Other (See Comments)  ?  "Stroke-like symptoms" ?Can tolerate Phenergan.  ? Cymbalta [Duloxetine Hcl] Other (See Comments)  ?  Fainting  ? Imitrex [Sumatriptan Base] Hives and Nausea And Vomiting  ? Lyrica [Pregabalin] Other (See Comments)  ?  Fainting  ? Metoclopramide Hcl Hives  ? Percocet [Oxycodone-Acetaminophen] Itching  ? Effexor [Venlafaxine Hydrochloride] Hives and Palpitations  ? Fentanyl Nausea And Vomiting  ? Tape Itching and Rash  ? ?Prior to Admission medications   ?Medication Sig Start Date End Date Taking? Authorizing Provider  ?amitriptyline (ELAVIL) 50 MG tablet Take 1 tablet (50 mg total) by mouth at bedtime. 09/27/21  Yes [name], FNP  ?busPIRone (BUSPAR) 5 MG tablet Take 1 tablet (5 mg total) by mouth 2 (two) times daily. ?Patient taking differently: Take 5 mg by mouth at bedtime. 09/27/21  Yes [name], FNP  ?cetirizine (ZYRTEC) 10 MG tablet Take 10 mg by mouth daily.   Yes [provider]  ?dicyclomine  (BENTYL) 20 MG tablet Take 20 mg by mouth 4 (four) times daily -  before meals and at bedtime. Up to four times daily as needed   Yes [provider]  ?docusate sodium (COLACE) 100 MG capsule Take 100 mg by mouth daily.   Yes [provider]  ?eszopiclone (LUNESTA) 2 MG TABS tablet TAKE ONE TABLET by MOUTH AT bedtime as needed FOR SLEEP. Take immediately BEFORE bedtime 09/27/21  Yes [name], FNP  ?folic acid (FOLVITE) 1 MG tablet Take 3 mg by mouth daily.   Yes [provider]  ?  methotrexate (50 MG/ML) 1 g injection Inject into the vein every Monday. 0.69m every Monday   Yes [provider]  ?olmesartan (BENICAR) 20 MG tablet Take 1 tablet (20 mg total) by mouth daily. 04/21/21  Yes MMinette Brine FNP  ?omeprazole (PRILOSEC) 40 MG capsule Take 1 capsule (40 mg total) by mouth daily. 04/29/21  Yes NRozetta Nunnery MD  ?polyethylene glycol (MIRALAX / GLYCOLAX) 17 g packet Take 17 g by mouth daily as needed.   Yes [provider]  ?tiZANidine (ZANAFLEX) 2 MG tablet Take 2 mg by mouth 3 (three) times daily.   Yes [provider]  ?Vitamin D, Ergocalciferol, (DRISDOL) 1.25 MG (50000 UNIT) CAPS capsule Take 1 capsule (50,000 Units total) by mouth 2 (two) times a week. ?Patient taking differently: Take 50,000 Units by mouth 2 (two) times a week. Monday and Wednesday 11/23/20  Yes MMinette Brine FNP  ?albuterol (VENTOLIN HFA) 108 (90 Base) MCG/ACT inhaler Inhale 2 puffs into the lungs every 6 (six) hours as needed for wheezing or shortness of breath. 04/13/20   MMinette Brine FNP  ?gabapentin (NEURONTIN) 300 MG capsule Take 1 capsule (300 mg total) by mouth 2 (two) times daily for 14 days. For 2 weeks post op for pain. 11/15/21 01/05/22  BMerlene PullingK, PA-C  ?golimumab (SIMPONI ARIA) 50 MG/4ML SOLN injection 50 mg See admin instructions. Infusion every 8 weeks, [place] [facility]    [provider]  ?HYDROcodone-acetaminophen (NORCO) 10-325 MG tablet  Take 1-2 tablets by mouth every 6 (six) hours as needed for moderate pain.    [provider]  ?ibuprofen (ADVIL) 200 MG tablet Take 200 mg by mouth every 6 (six) hours as needed for moderate

## 2022-01-10 ENCOUNTER — Telehealth: Payer: Self-pay

## 2022-01-10 ENCOUNTER — Telehealth: Payer: Medicare Other

## 2022-01-10 NOTE — Progress Notes (Signed)

## 2022-01-10 NOTE — Telephone Encounter (Signed)
?  Care Management  ? ?Follow Up Note ? ? ?01/10/2022 ?Name: Gabrielle Hawkins MRN: [id number] DOB: [birth year] ? ? ?Referred by: [name], FNP ?Reason for referral : Chronic Care Management (RN CM Follow up call ) ? ? ?An unsuccessful telephone outreach was attempted today. The patient was referred to the case management team for assistance with care management and care coordination.  ? ?Follow Up Plan: A HIPPA compliant phone message was left for the patient providing contact information and requesting a return call.  ? ?Barb Merino, RN, BSN, CCM ?Care Management Coordinator ?[place] [facility]  ?Direct Phone: [phone] ? ? ?

## 2022-01-12 NOTE — Discharge Instructions (Addendum)
?Ophelia Charter MD, MPH ?Noemi Chapel, PA-C ?[facility] ?[address], [address] ?[phone] (tel)   ?[phone] (fax) ? ? ?POST-OPERATIVE INSTRUCTIONS ? ? ?WOUND CARE ?Please keep splint clean dry and intact until followup.  ?You may shower on Post-Op Day #2.  ?You must keep splint dry during this process and may find that a plastic bag taped around the extremity or alternatively a towel based bath may be a better option.   ?If you get your splint wet or if it is damaged please contact our clinic. ? ?EXERCISES ?Due to your splint being in place you will not be able to bear weight through your extremity.    ?You may use a sling for comfort ?Please continue to work on range of motion of your fingers and stretch these multiple times a day to prevent stiffness. ?Please continue to ambulate and do not stay sitting or lying for too long. Perform foot and wrist pumps to assist in circulation. ? ?FOLLOW-UP ?If you develop a Fever (>101.5), Redness or Drainage from the surgical incision site, please call our office to arrange for an evaluation. ?Please call the office to schedule a follow-up appointment for your incision check if you do not already have one, 7-10 days post-operatively. ? ?REGIONAL ANESTHESIA (NERVE BLOCKS) ?The anesthesia team may have performed a nerve block for you if safe in the setting of your care.  This is a great tool used to minimize pain.  Typically the block may start wearing off overnight but the long acting medicine may last for 3-4 days.  The nerve block wearing off can be a challenging period but please utilize your as needed pain medications to try and manage this period.   ? ?POST-OP MEDICATIONS- Multimodal approach to pain control ? In general your pain will be controlled with a combination of substances.  Prescriptions unless otherwise discussed are electronically sent to your pharmacy.  This is a carefully made plan we use to minimize narcotic use.     ? ? - Meloxicam - Anti-inflammatory medication taken on a scheduled basis ? - Gabapentin - this is a medication to help with nerve pain, take on a scheduled basis ? - Acetaminophen - Non-narcotic pain medicine taken on a scheduled basis  ? - Hydrocodone-acetaminophen (Norco) - This is a strong narcotic, to be used only on an "as needed" basis for SEVERE pain. ?   - Do not take more than 3500 mg of Tylenol daily  ?            -           Zofran - take as needed for nausea  ? ?HELPFUL INFORMATION ? ? If you had a block, it will wear off between 8-24 hrs postop typically.  This is period when your pain may go from nearly zero to the pain you would have had postop without the block.  This is an abrupt transition but nothing dangerous is happening.  You may take an extra dose of narcotic when this happens. ? ? You may be more comfortable sleeping in a semi-seated position the first few nights following surgery.  Keep a pillow propped under the elbow and forearm for comfort.  If you have a recliner type of chair it might be beneficial.  If not that is fine too, but it would be helpful to sleep propped up with pillows behind your operated shoulder as well under your elbow and forearm.  This will reduce pulling on  the suture lines. ? ? When dressing, put your operative arm in the sleeve first.  When getting undressed, take your operative arm out last.  Loose fitting, button-down shirts are recommended.  Often in the first days after surgery you may be more comfortable keeping your operative arm under your shirt and not through the sleeve. ? ? You may return to work/school in the next couple of days when you feel up to it.  Desk work and typing in the sling is fine. ? ? We suggest you use the pain medication the first night prior to going to bed, in order to ease any pain when the anesthesia wears off. You should avoid taking pain medications on an empty stomach as it will make you nauseous. ? ? You should wean off your  narcotic medicines as soon as you are able.  Most patients will be off or using minimal narcotics before their first postop appointment.  ? ? Do not drink alcoholic beverages or take illicit drugs when taking pain medications. ? ? It is against the law to drive while taking narcotics.  In some states it is against the law to drive while your arm is in a sling.  ? ? Pain medication may make you constipated.  Below are a few solutions to try in this order: ?  - Decrease the amount of pain medication if you aren't having pain. ?  - Drink lots of decaffeinated fluids. ?  - Drink prune juice and/or each dried prunes ? ? If the first 3 don't work start with additional solutions ?  - Take Colace - an over-the-counter stool softener ?  - Take Senokot - an over-the-counter laxative ?  - Take Miralax - a stronger over-the-counter laxative ? ? ?For more information including helpful videos and documents visit our website:  ? ?[email or web address]-information.[email or web address] ? ? ? ?Post Anesthesia Home Care Instructions ? ?Activity: ?Get plenty of rest for the remainder of the day. A responsible individual must stay with you for 24 hours following the procedure.  ?For the next 24 hours, DO NOT: ?-Drive a car ?-Paediatric nurse ?-Drink alcoholic beverages ?-Take any medication unless instructed by your physician ?-Make any legal decisions or sign important papers. ? ?Meals: ?Start with liquid foods such as gelatin or soup. Progress to regular foods as tolerated. Avoid greasy, spicy, heavy foods. If nausea and/or vomiting occur, drink only clear liquids until the nausea and/or vomiting subsides. Call your physician if vomiting continues. ? ?Special Instructions/Symptoms: ?Your throat may feel dry or sore from the anesthesia or the breathing tube placed in your throat during surgery. If this causes discomfort, gargle with warm salt water. The discomfort should disappear within 24 hours. ? ?If you had a scopolamine patch  placed behind your ear for the management of post- operative nausea and/or vomiting: ? ?1. The medication in the patch is effective for 72 hours, after which it should be removed.  Wrap patch in a tissue and discard in the trash. Wash hands thoroughly with soap and water. ?2. You may remove the patch earlier than 72 hours if you experience unpleasant side effects which may include dry mouth, dizziness or visual disturbances. ?3. Avoid touching the patch. Wash your hands with soap and water after contact with the patch. ?   ? ?

## 2022-01-13 ENCOUNTER — Ambulatory Visit (HOSPITAL_BASED_OUTPATIENT_CLINIC_OR_DEPARTMENT_OTHER)
Admission: RE | Admit: 2022-01-13 | Discharge: 2022-01-13 | Disposition: A | Discharge status: Home / Self Care | Payer: Medicare Other | Source: Ambulatory Visit | Attending: Orthopaedic Surgery | Admitting: Orthopaedic Surgery

## 2022-01-13 ENCOUNTER — Ambulatory Visit (HOSPITAL_BASED_OUTPATIENT_CLINIC_OR_DEPARTMENT_OTHER): Payer: Medicare Other | Admitting: Certified Registered"

## 2022-01-13 ENCOUNTER — Encounter (HOSPITAL_BASED_OUTPATIENT_CLINIC_OR_DEPARTMENT_OTHER): Payer: Self-pay | Admitting: Orthopaedic Surgery

## 2022-01-13 ENCOUNTER — Encounter (HOSPITAL_BASED_OUTPATIENT_CLINIC_OR_DEPARTMENT_OTHER)
Admission: RE | Disposition: A | Discharge status: Home / Self Care | Payer: Self-pay | Source: Ambulatory Visit | Attending: Orthopaedic Surgery

## 2022-01-13 ENCOUNTER — Other Ambulatory Visit: Payer: Self-pay

## 2022-01-13 DIAGNOSIS — Z79899 Other long term (current) drug therapy: Secondary | ICD-10-CM | POA: Insufficient documentation

## 2022-01-13 DIAGNOSIS — M1990 Unspecified osteoarthritis, unspecified site: Secondary | ICD-10-CM | POA: Diagnosis not present

## 2022-01-13 DIAGNOSIS — I10 Essential (primary) hypertension: Secondary | ICD-10-CM | POA: Diagnosis not present

## 2022-01-13 DIAGNOSIS — Z8711 Personal history of peptic ulcer disease: Secondary | ICD-10-CM | POA: Insufficient documentation

## 2022-01-13 DIAGNOSIS — G5621 Lesion of ulnar nerve, right upper limb: Secondary | ICD-10-CM | POA: Diagnosis not present

## 2022-01-13 DIAGNOSIS — Z853 Personal history of malignant neoplasm of breast: Secondary | ICD-10-CM | POA: Insufficient documentation

## 2022-01-13 DIAGNOSIS — G709 Myoneural disorder, unspecified: Secondary | ICD-10-CM | POA: Insufficient documentation

## 2022-01-13 DIAGNOSIS — K219 Gastro-esophageal reflux disease without esophagitis: Secondary | ICD-10-CM | POA: Insufficient documentation

## 2022-01-13 DIAGNOSIS — F32A Depression, unspecified: Secondary | ICD-10-CM | POA: Diagnosis not present

## 2022-01-13 DIAGNOSIS — K5090 Crohn's disease, unspecified, without complications: Secondary | ICD-10-CM | POA: Insufficient documentation

## 2022-01-13 DIAGNOSIS — F418 Other specified anxiety disorders: Secondary | ICD-10-CM | POA: Diagnosis not present

## 2022-01-13 DIAGNOSIS — F419 Anxiety disorder, unspecified: Secondary | ICD-10-CM | POA: Insufficient documentation

## 2022-01-13 DIAGNOSIS — J45909 Unspecified asthma, uncomplicated: Secondary | ICD-10-CM | POA: Diagnosis not present

## 2022-01-13 DIAGNOSIS — T8489XA Other specified complication of internal orthopedic prosthetic devices, implants and grafts, initial encounter: Secondary | ICD-10-CM

## 2022-01-13 HISTORY — DX: Essential (primary) hypertension: I10

## 2022-01-13 HISTORY — PX: ANTERIOR INTEROSSEOUS NERVE DECOMPRESSION: SHX5735

## 2022-01-13 HISTORY — PX: HARDWARE REMOVAL: SHX979

## 2022-01-13 SURGERY — ANTERIOR INTEROSSEOUS NERVE DECOMPRESSION
Anesthesia: Regional | Site: Elbow | Laterality: Right

## 2022-01-13 MED ORDER — LIDOCAINE HCL (PF) 1 % IJ SOLN
INTRAMUSCULAR | Status: AC
  Filled 2022-01-13: qty 30

## 2022-01-13 MED ORDER — GABAPENTIN 300 MG PO CAPS: 300.0000 mg | ORAL_CAPSULE | Freq: Two times a day (BID) | ORAL | 0 refills | Status: DC

## 2022-01-13 MED ORDER — MIDAZOLAM HCL 2 MG/2ML IJ SOLN
2.0000 mg | Freq: Once | INTRAMUSCULAR | Status: AC
Start: 2022-01-13 — End: 2022-01-13
  Administered 2022-01-13: 2 mg via INTRAVENOUS

## 2022-01-13 MED ORDER — ONDANSETRON HCL 4 MG PO TABS: 4.0000 mg | ORAL_TABLET | Freq: Three times a day (TID) | ORAL | 0 refills | Status: AC | PRN

## 2022-01-13 MED ORDER — KETOROLAC TROMETHAMINE 30 MG/ML IJ SOLN
30.0000 mg | Freq: Once | INTRAMUSCULAR | Status: AC
Start: 2022-01-13 — End: 2022-01-13
  Administered 2022-01-13: 30 mg via INTRAVENOUS

## 2022-01-13 MED ORDER — ACETAMINOPHEN 500 MG PO TABS: 500.0000 mg | ORAL_TABLET | Freq: Three times a day (TID) | ORAL | 0 refills | Status: AC

## 2022-01-13 MED ORDER — ACETAMINOPHEN 10 MG/ML IV SOLN
INTRAVENOUS | Status: AC
  Filled 2022-01-13: qty 100

## 2022-01-13 MED ORDER — FENTANYL CITRATE (PF) 100 MCG/2ML IJ SOLN: 100.0000 ug | Freq: Once | INTRAMUSCULAR | Status: DC

## 2022-01-13 MED ORDER — PROPOFOL 10 MG/ML IV BOLUS
INTRAVENOUS | Status: DC | PRN
  Administered 2022-01-13: 150 mg via INTRAVENOUS

## 2022-01-13 MED ORDER — MIDAZOLAM HCL 2 MG/2ML IJ SOLN
INTRAMUSCULAR | Status: AC
  Filled 2022-01-13: qty 2

## 2022-01-13 MED ORDER — FENTANYL CITRATE (PF) 100 MCG/2ML IJ SOLN
INTRAMUSCULAR | Status: AC
  Filled 2022-01-13: qty 2

## 2022-01-13 MED ORDER — ACETAMINOPHEN 10 MG/ML IV SOLN
1000.0000 mg | Freq: Once | INTRAVENOUS | Status: DC | PRN
  Administered 2022-01-13: 1000 mg via INTRAVENOUS

## 2022-01-13 MED ORDER — MELOXICAM 15 MG PO TABS: 15.0000 mg | ORAL_TABLET | Freq: Every day | ORAL | 0 refills | Status: AC

## 2022-01-13 MED ORDER — OXYCODONE HCL 5 MG/5ML PO SOLN: 5.0000 mg | Freq: Once | ORAL | Status: DC | PRN

## 2022-01-13 MED ORDER — HYDROMORPHONE HCL 1 MG/ML IJ SOLN
0.5000 mg | INTRAMUSCULAR | Status: DC | PRN
  Administered 2022-01-13 (×2): 0.5 mg via INTRAVENOUS

## 2022-01-13 MED ORDER — OXYCODONE HCL 5 MG PO TABS: 5.0000 mg | ORAL_TABLET | Freq: Once | ORAL | Status: DC | PRN

## 2022-01-13 MED ORDER — LACTATED RINGERS IV SOLN: INTRAVENOUS | Status: DC

## 2022-01-13 MED ORDER — LIDOCAINE HCL (CARDIAC) PF 100 MG/5ML IV SOSY
PREFILLED_SYRINGE | INTRAVENOUS | Status: DC | PRN
  Administered 2022-01-13: 30 mg via INTRAVENOUS

## 2022-01-13 MED ORDER — CEFAZOLIN SODIUM-DEXTROSE 2-4 GM/100ML-% IV SOLN
2.0000 g | INTRAVENOUS | Status: AC
  Administered 2022-01-13: 2 g via INTRAVENOUS

## 2022-01-13 MED ORDER — HYDROMORPHONE HCL 1 MG/ML IJ SOLN
INTRAMUSCULAR | Status: AC
  Filled 2022-01-13: qty 0.5

## 2022-01-13 MED ORDER — HYDROMORPHONE HCL 1 MG/ML IJ SOLN
INTRAMUSCULAR | Status: AC
Start: 2022-01-13 — End: ?
  Filled 2022-01-13: qty 0.5

## 2022-01-13 MED ORDER — 0.9 % SODIUM CHLORIDE (POUR BTL) OPTIME
TOPICAL | Status: DC | PRN
  Administered 2022-01-13: 1000 mL

## 2022-01-13 MED ORDER — CEFAZOLIN SODIUM-DEXTROSE 2-4 GM/100ML-% IV SOLN
INTRAVENOUS | Status: AC
  Filled 2022-01-13: qty 100

## 2022-01-13 MED ORDER — VANCOMYCIN HCL 1000 MG IV SOLR
INTRAVENOUS | Status: DC | PRN
  Administered 2022-01-13: 1000 mg

## 2022-01-13 MED ORDER — KETOROLAC TROMETHAMINE 30 MG/ML IJ SOLN
INTRAMUSCULAR | Status: AC
  Filled 2022-01-13: qty 1

## 2022-01-13 MED ORDER — AMISULPRIDE (ANTIEMETIC) 5 MG/2ML IV SOLN: 10.0000 mg | Freq: Once | INTRAVENOUS | Status: DC | PRN

## 2022-01-13 MED ORDER — BUPIVACAINE HCL (PF) 0.5 % IJ SOLN
INTRAMUSCULAR | Status: AC
  Filled 2022-01-13: qty 30

## 2022-01-13 MED ORDER — HYDROCODONE-ACETAMINOPHEN 5-325 MG PO TABS: 1.0000 | ORAL_TABLET | Freq: Four times a day (QID) | ORAL | 0 refills | Status: DC | PRN

## 2022-01-13 MED ORDER — BUPIVACAINE-EPINEPHRINE (PF) 0.5% -1:200000 IJ SOLN
INTRAMUSCULAR | Status: DC | PRN
  Administered 2022-01-13: 30 mL via PERINEURAL

## 2022-01-13 MED ORDER — DEXAMETHASONE SODIUM PHOSPHATE 10 MG/ML IJ SOLN
INTRAMUSCULAR | Status: DC | PRN
  Administered 2022-01-13: 4 mg via INTRAVENOUS

## 2022-01-13 MED ORDER — ONDANSETRON HCL 4 MG/2ML IJ SOLN
INTRAMUSCULAR | Status: DC | PRN
  Administered 2022-01-13: 4 mg via INTRAVENOUS

## 2022-01-13 SURGICAL SUPPLY — 78 items
APL PRP STRL LF DISP 70% ISPRP (MISCELLANEOUS) ×1
BLADE HEX COATED 2.75 (ELECTRODE) IMPLANT
BLADE SURG 10 STRL SS (BLADE) ×2 IMPLANT
BLADE SURG 15 STRL LF DISP TIS (BLADE) ×1 IMPLANT
BLADE SURG 15 STRL SS (BLADE) ×2
BNDG CMPR 9X4 STRL LF SNTH (GAUZE/BANDAGES/DRESSINGS) ×1
BNDG COHESIVE 4X5 TAN ST LF (GAUZE/BANDAGES/DRESSINGS) ×2 IMPLANT
BNDG ELASTIC 3X5.8 VLCR STR LF (GAUZE/BANDAGES/DRESSINGS) IMPLANT
BNDG ELASTIC 4X5.8 VLCR STR LF (GAUZE/BANDAGES/DRESSINGS) ×4 IMPLANT
BNDG ESMARK 4X9 LF (GAUZE/BANDAGES/DRESSINGS) ×2 IMPLANT
BNDG GAUZE ELAST 4 BULKY (GAUZE/BANDAGES/DRESSINGS) IMPLANT
BRUSH SCRUB EZ PLAIN DRY (MISCELLANEOUS) IMPLANT
CANISTER SUCT 1200ML W/VALVE (MISCELLANEOUS) IMPLANT
CHLORAPREP W/TINT 26 (MISCELLANEOUS) ×2 IMPLANT
CLSR STERI-STRIP ANTIMIC 1/2X4 (GAUZE/BANDAGES/DRESSINGS) ×2 IMPLANT
CORD BIPOLAR FORCEPS 12FT (ELECTRODE) ×1 IMPLANT
COVER BACK TABLE 60X90IN (DRAPES) ×1 IMPLANT
CUFF TOURN SGL QUICK 18X4 (TOURNIQUET CUFF) ×1 IMPLANT
CUFF TOURN SGL QUICK 24 (TOURNIQUET CUFF)
CUFF TRNQT CYL 24X4X16.5-23 (TOURNIQUET CUFF) IMPLANT
DRAPE EXTREMITY T 121X128X90 (DISPOSABLE) ×2 IMPLANT
DRAPE IMP U-DRAPE 54X76 (DRAPES) IMPLANT
DRAPE INCISE IOBAN 66X45 STRL (DRAPES) IMPLANT
DRAPE OEC MINIVIEW 54X84 (DRAPES) ×1 IMPLANT
DRAPE SURG 17X23 STRL (DRAPES) ×2 IMPLANT
DRAPE U-SHAPE 47X51 STRL (DRAPES) ×1 IMPLANT
DRSG AQUACEL AG ADV 3.5X 6 (GAUZE/BANDAGES/DRESSINGS) ×1 IMPLANT
DRSG EMULSION OIL 3X3 NADH (GAUZE/BANDAGES/DRESSINGS) IMPLANT
DRSG PAD ABDOMINAL 8X10 ST (GAUZE/BANDAGES/DRESSINGS) ×1 IMPLANT
ELECT REM PT RETURN 9FT ADLT (ELECTROSURGICAL) ×2
ELECTRODE REM PT RTRN 9FT ADLT (ELECTROSURGICAL) ×1 IMPLANT
EXT HOSE W/PLC CONNECTION (MISCELLANEOUS)
EXTENSION HOSE W/PLC CONNECTON (MISCELLANEOUS) ×1 IMPLANT
GAUZE SPONGE 4X4 12PLY STRL (GAUZE/BANDAGES/DRESSINGS) ×2 IMPLANT
GAUZE XEROFORM 1X8 LF (GAUZE/BANDAGES/DRESSINGS) ×2 IMPLANT
GLOVE BIO SURGEON STRL SZ 6.5 (GLOVE) ×2 IMPLANT
GLOVE BIOGEL PI IND STRL 6.5 (GLOVE) ×1 IMPLANT
GLOVE BIOGEL PI IND STRL 8 (GLOVE) ×1 IMPLANT
GLOVE BIOGEL PI INDICATOR 6.5 (GLOVE) ×1
GLOVE BIOGEL PI INDICATOR 8 (GLOVE) ×1
GLOVE ECLIPSE 8.0 STRL XLNG CF (GLOVE) ×2 IMPLANT
GOWN STRL REUS W/ TWL LRG LVL3 (GOWN DISPOSABLE) ×2 IMPLANT
GOWN STRL REUS W/TWL LRG LVL3 (GOWN DISPOSABLE) ×4
GOWN STRL REUS W/TWL XL LVL3 (GOWN DISPOSABLE) ×2 IMPLANT
LOOP VESSEL MAXI BLUE (MISCELLANEOUS) ×2 IMPLANT
NDL HYPO 25X1 1.5 SAFETY (NEEDLE) IMPLANT
NEEDLE HYPO 25X1 1.5 SAFETY (NEEDLE) IMPLANT
NS IRRIG 1000ML POUR BTL (IV SOLUTION) ×2 IMPLANT
PACK BASIN DAY SURGERY FS (CUSTOM PROCEDURE TRAY) ×2 IMPLANT
PAD CAST 4YDX4 CTTN HI CHSV (CAST SUPPLIES) ×1 IMPLANT
PADDING CAST COTTON 4X4 STRL (CAST SUPPLIES) ×4
PENCIL SMOKE EVACUATOR (MISCELLANEOUS) ×2 IMPLANT
SHEET MEDIUM DRAPE 40X70 STRL (DRAPES) IMPLANT
SLEEVE SCD COMPRESS KNEE MED (STOCKING) ×1 IMPLANT
SLING ARM FOAM STRAP LRG (SOFTGOODS) IMPLANT
SPIKE FLUID TRANSFER (MISCELLANEOUS) IMPLANT
SPLINT FAST PLASTER 5X30 (CAST SUPPLIES) ×10
SPLINT PLASTER CAST FAST 5X30 (CAST SUPPLIES) ×10 IMPLANT
SPONGE T-LAP 4X18 ~~LOC~~+RFID (SPONGE) IMPLANT
STOCKINETTE 4X48 STRL (DRAPES) IMPLANT
STOCKINETTE IMPERVIOUS LG (DRAPES) IMPLANT
SUCTION FRAZIER HANDLE 10FR (MISCELLANEOUS) ×2
SUCTION TUBE FRAZIER 10FR DISP (MISCELLANEOUS) IMPLANT
SUT ETHILON 3 0 PS 1 (SUTURE) ×2 IMPLANT
SUT MNCRL AB 4-0 PS2 18 (SUTURE) ×2 IMPLANT
SUT VIC AB 0 CT1 27 (SUTURE)
SUT VIC AB 0 CT1 27XBRD ANBCTR (SUTURE) ×1 IMPLANT
SUT VIC AB 2-0 SH 27 (SUTURE)
SUT VIC AB 2-0 SH 27XBRD (SUTURE) IMPLANT
SUT VIC AB 3-0 SH 27 (SUTURE) ×4
SUT VIC AB 3-0 SH 27X BRD (SUTURE) IMPLANT
SUT VICRYL 0 SH 27 (SUTURE) ×3 IMPLANT
SYR BULB EAR ULCER 3OZ GRN STR (SYRINGE) ×2 IMPLANT
SYR CONTROL 10ML LL (SYRINGE) IMPLANT
TOWEL GREEN STERILE FF (TOWEL DISPOSABLE) ×4 IMPLANT
TUBE CONNECTING 20X1/4 (TUBING) ×2 IMPLANT
TUBE SUCTION HIGH CAP CLEAR NV (SUCTIONS) ×2 IMPLANT
YANKAUER SUCT BULB TIP NO VENT (SUCTIONS) IMPLANT

## 2022-01-13 NOTE — Op Note (Signed)
Orthopaedic Surgery Operative Note (CSN: [id number]) ? ?Gabrielle Hawkins  01/02/1966 ?Date of Surgery: 01/13/2022 ? ? 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 ?Procedure: ?Right elbow external fixator/hinged removal ?Right elbow ulnar nerve decompression and transposition subcutaneous ?  ?Operative Finding ?Successful completion of the planned procedure.  Patient's range of motion was about 15 to 20 degrees short of full extension with flexion to 125 degrees, pronosupination was essentially normal.  Her hardware was removed without issue.  We made an accessory incision to remove the transfixion pin.  Her ulnar nerve was clearly irritable and was well compressed though there is no obvious damage to it.  We sacrificed the branches of the articular surface and performed a subcutaneous transposition without issue. ? ?Post-operative plan: The patient will be nonweightbearing in a splint for 1 week.  The patient will be discharged home.  DVT prophylaxis not indicated in this ambulatory upper extremity patient without significant risk factors.   Pain control with PRN pain medication preferring oral medicines.  Follow up plan will be scheduled in approximately 7 days for incision check and XR. ? ?Post-Op Diagnosis: Same ?Surgeons:Primary: Hiram Gash, MD ?Assistants:Caroline McBane PA-C ?Location: [facility] OR ROOM 6 ?Anesthesia: General with regional anesthesia ?Antibiotics: Ancef 2 g with local vancomycin powder 1 g at the surgical site ?Tourniquet time:  ?Total Tourniquet Time Documented: ?Upper Arm (Right) - 38 minutes ?Total: Upper Arm (Right) - 38 minutes ? ?Estimated Blood Loss: Minimal ?Complications: None ?Specimens: None ?Implants: ?* No implants in log * ? ?Indications for Surgery:   ?MARKIYAH GAHM is a 56 y.o. female with terrible triad injury with hinged internal fixator placement at that time.  She had progressive ulnar neuritis  type symptoms and we felt that during plan removal of the hinge the patient will be a good candidate for an ulnar nerve decompression and transposition.  Benefits and risks of operative and nonoperative management were discussed prior to surgery with patient/guardian(s) and informed consent form was completed.  Specific risks including infection, need for additional surgery, stiffness, loss of reduction, neurovascular injury amongst others. ? ? ?Procedure:   ?The patient was identified properly. Informed consent was obtained and the surgical site was marked. The patient was taken up to suite where general anesthesia was induced.  The patient was positioned supine on a hand table.  The right elbow was prepped and draped in the usual sterile fashion.  Timeout was performed before the beginning of the case. ? ?Tourniquet was used for the above duration. ? ?We began by identifying and removing the internal fixator.  We palpated the prominent areas of the fixator and made a stab incision over the hinge.  At that point were able to remove the setscrew.  The posterior incision of the olecranon was opened partially.  With the skin sharp achieving hemostasis we progressed.  Once we identified the plate remove the 3 screws from the plate.  That point were able to remove the transfixion pin as well as the plate without issue.  We irrigated and closed these incisions in typical fashion. ? ?We began with a 8 cm curvilinear incision over the medial aspect of the elbow just anterior to the epicondyle.  Went through skin sharply achieving hemostasis we progressed.  At that point we identified branches of the medial antebrachial cutaneous nerve were protected and mobilized.  Superficial veins were protected and mobilized.  We then encountered the cubital tunnel.  We went proximal and found the ulnar nerve proximally near the medial head of the triceps.  We dissected proximally to the arcade of Struthers and released this so that we  had finger passage easily and the nerve was clearly decompressed in this area.  We then proceeded distally mobilizing the nerve and decompressing as we proceeded. ? ?We are able to mobilize the nerve and preserve the vascular structures around it and place a vessel loop around it to help with mobility.  We were able to decompress it through the cubital tunnel and identified the branch to the articular surface which was sacrificed and we protected the branches to the FCU.  We split the FCU fascia deep and superficial making sure that we were free and were able to pass a finger deep and much distal to our incision and if the nerve was very free at this point.   ? ?At this point the medial antebrachial septum was resected just off the bone taking care to preserve the vascular structure around it.  Once this was complete we were able to mobilize the nerve and move it anterior.  We created a subcutaneous bed and used a 0 Vicryl to hold it within the subcutaneous tissue. ? ? ?We took final assessment of the nerve and it was free proximal and distal as well as in its fascial sling with no tension through gentle range of motion of the elbow.  We irrigated copiously and then placed our local antibiotics as well as local anesthetic. ? ?We closed the skin in a multilayer fashion with sutures before placing a sterile dressing and a well-padded posterior slab splint.  Patient was awoken taken to PACU in stable condition.  ? ? ?Noemi Chapel, PA-C, present and scrubbed throughout the case, critical for completion in a timely fashion, and for retraction, instrumentation, closure. ?  ?

## 2022-01-13 NOTE — Transfer of Care (Signed)
Immediate Anesthesia Transfer of Care Note ? ?[name] ? ?Procedure(s) Performed: ANTERIOR INTEROSSEOUS NERVE DECOMPRESSION (Right: Elbow) ?HARDWARE REMOVAL/ELBOW (Right: Elbow) ? ?Patient Location: PACU ? ?Anesthesia Type:General and Regional ? ?Level of Consciousness: awake, alert  and oriented ? ?Airway & Oxygen Therapy: Patient Spontanous Breathing and Patient connected to face mask oxygen ? ?Post-op Assessment: Report given to RN and Post -op Vital signs reviewed and stable ? ?Post vital signs: Reviewed and stable ? ?Last Vitals:  ?Vitals Value Taken Time  ?BP 129/79 01/13/22 1327  ?Temp    ?Pulse 105 01/13/22 1328  ?Resp 17 01/13/22 1328  ?SpO2 97 % 01/13/22 1328  ?Vitals shown include unvalidated device data. ? ?Last Pain: There were no vitals filed for this visit.   ? ?  ? ?Complications: No notable events documented. ?

## 2022-01-13 NOTE — Anesthesia Preprocedure Evaluation (Addendum)
Anesthesia Evaluation  ?Patient identified by MRN, date of birth, ID band ?Patient awake ? ? ? ?Reviewed: ?Allergy & Precautions, NPO status , Patient's Chart, lab work & pertinent test results ? ?Airway ?Mallampati: III ? ?TM Distance: >3 FB ?Neck ROM: Full ? ? ? Dental ?no notable dental hx. ? ?  ?Pulmonary ?asthma ,  ?  ?Pulmonary exam normal ? ? ? ? ? ? ? Cardiovascular ?hypertension, Pt. on medications ?Normal cardiovascular exam ? ? ?  ?Neuro/Psych ? Headaches, PSYCHIATRIC DISORDERS Anxiety Depression  Neuromuscular disease   ? GI/Hepatic ?Neg liver ROS, PUD, GERD  Medicated and Controlled,  ?Endo/Other  ?negative endocrine ROS ? Renal/GU ?negative Renal ROS  ? ?  ?Musculoskeletal ? ?(+) Arthritis , Patient reports numbness in the right ulnar nerve area   ? Abdominal ?  ?Peds ? Hematology ?negative hematology ROS ?(+)   ?Anesthesia Other Findings ?RIGHT ELBOW ULNAR NERVER LESION,HARDWARE COMPLICATIONS ? Reproductive/Obstetrics ? ?  ? ? ? ? ? ? ? ? ? ? ? ? ? ?  ?  ? ? ? ? ? ? ?Anesthesia Physical ?Anesthesia Plan ? ?ASA: 3 ? ?Anesthesia Plan: General and Regional  ? ?Post-op Pain Management: Regional block*  ? ?Induction: Intravenous ? ?PONV Risk Score and Plan: 3 and Ondansetron, Dexamethasone, Propofol infusion, Midazolam and Treatment may vary due to age or medical condition ? ?Airway Management Planned: LMA ? ?Additional Equipment:  ? ?Intra-op Plan:  ? ?Post-operative Plan: Extubation in OR ? ?Informed Consent: I have reviewed the patients History and Physical, chart, labs and discussed the procedure including the risks, benefits and alternatives for the proposed anesthesia with the patient or authorized representative who has indicated his/her understanding and acceptance.  ? ? ? ?Dental advisory given ? ?Plan Discussed with: CRNA and Surgeon ? ?Anesthesia Plan Comments: (Patient requests no fentanyl )  ? ? ? ? ? ?Anesthesia Quick Evaluation ? ?

## 2022-01-13 NOTE — Progress Notes (Signed)
Assisted Dr. Roanna Banning with right, interscalene , ultrasound guided block. Side rails up, monitors on throughout procedure. See vital signs in flow sheet. Tolerated Procedure well. ?

## 2022-01-13 NOTE — Anesthesia Postprocedure Evaluation (Signed)
Anesthesia Post Note ? ?[name] ? ?Procedure(s) Performed: ANTERIOR INTEROSSEOUS NERVE DECOMPRESSION (Right: Elbow) ?HARDWARE REMOVAL/ELBOW (Right: Elbow) ? ?  ? ?Patient location during evaluation: PACU ?Anesthesia Type: Regional and General ?Level of consciousness: awake ?Pain management: pain level controlled ?Vital Signs Assessment: post-procedure vital signs reviewed and stable ?Respiratory status: spontaneous breathing, nonlabored ventilation, respiratory function stable and patient connected to nasal cannula oxygen ?Cardiovascular status: blood pressure returned to baseline and stable ?Postop Assessment: no apparent nausea or vomiting ?Anesthetic complications: no ? ? ?No notable events documented. ? ?Last Vitals:  ?Vitals:  ? 01/13/22 1430 01/13/22 1515  ?BP: 124/73 (!) 143/94  ?Pulse: 86 78  ?Resp: 20 18  ?Temp:  37.1 ?C  ?SpO2: 94% 98%  ?  ?Last Pain:  ?Vitals:  ? 01/13/22 1515  ?PainSc: 4   ? ? ?  ?  ?  ?  ?  ?  ? ?Sherita Decoste P Tashema Tiller ? ? ? ? ?

## 2022-01-13 NOTE — Anesthesia Procedure Notes (Signed)
Anesthesia Regional Block: Supraclavicular block  ? ?Pre-Anesthetic Checklist: , timeout performed,  Correct Patient, Correct Site, Correct Laterality,  Correct Procedure, Correct Position, site marked,  Risks and benefits discussed,  Surgical consent,  Pre-op evaluation,  At surgeon's request and post-op pain management ? ?Laterality: Right ? ?Prep: chloraprep     ?  ?Needles:  ?Injection technique: Single-shot ? ?Needle Type: Echogenic Stimulator Needle   ? ? ?Needle Length: 9cm  ?Needle Gauge: 21  ? ? ? ?Additional Needles: ? ? ?Procedures:,,,, ultrasound used (permanent image in chart),,    ?Narrative:  ?Start time: 01/13/2022 11:25 AM ?End time: 01/13/2022 11:35 AM ?Injection made incrementally with aspirations every 5 mL. ? ?Performed by: Personally  ?Anesthesiologist: Murvin Natal, MD ? ?Additional Notes: ?Functioning IV was confirmed and monitors were applied.  A timeout was performed. Sterile prep, hand hygiene and sterile gloves were used. A 54m 21ga Arrow echogenic stimulator needle was used. Negative aspiration and negative test dose prior to incremental administration of local anesthetic. The patient tolerated the procedure well. ? ?Ultrasound guidance: relevent anatomy identified, needle position confirmed, local anesthetic spread visualized around nerve(s), vascular puncture avoided.  Image printed for medical record.  ? ? ? ? ? ?

## 2022-01-13 NOTE — Anesthesia Procedure Notes (Signed)
Procedure Name: LMA Insertion ?Date/Time: 01/13/2022 12:25 PM ?Performed by: Signe Colt, CRNA ?Pre-anesthesia Checklist: Patient identified, Emergency Drugs available, Suction available and Patient being monitored ?Patient Re-evaluated:Patient Re-evaluated prior to induction ?Oxygen Delivery Method: Circle System Utilized ?Preoxygenation: Pre-oxygenation with 100% oxygen ?Induction Type: IV induction ?Ventilation: Mask ventilation without difficulty ?LMA: LMA inserted ?LMA Size: 4.0 ?Number of attempts: 1 ?Airway Equipment and Method: bite block ?Placement Confirmation: positive ETCO2 ?Tube secured with: Tape ?Dental Injury: Teeth and Oropharynx as per pre-operative assessment  ? ? ? ? ?

## 2022-01-13 NOTE — Interval H&P Note (Signed)
All questions answered, patient wants to proceed with procedure. ? ?

## 2022-01-14 ENCOUNTER — Encounter (HOSPITAL_BASED_OUTPATIENT_CLINIC_OR_DEPARTMENT_OTHER): Payer: Self-pay | Admitting: Orthopaedic Surgery

## 2022-01-19 ENCOUNTER — Ambulatory Visit: Payer: Medicare Other

## 2022-01-19 ENCOUNTER — Telehealth: Payer: Self-pay

## 2022-01-19 DIAGNOSIS — I10 Essential (primary) hypertension: Secondary | ICD-10-CM

## 2022-01-19 NOTE — Chronic Care Management (AMB) (Signed)
Social Work Note  01/19/2022 Name: Gabrielle Hawkins MRN: [id number] DOB: 1966/07/12  Gabrielle Hawkins is a 56 y.o. year old female who is a primary care patient of [name], [place].  The Care Management team was consulted for assistance with chronic disease management and care coordination needs.  Ms. Fatica was given information about Care Management services today including:  Care Management services include personalized support from designated clinical staff supervised by her physician, including individualized plan of care and coordination with other care providers 24/7 contact phone numbers for assistance for urgent and routine care needs. The patient may stop care management services at any time (effective at the end of the month) by phone call to the office staff.  Patient agreed to services and consent obtained.   Engaged with patient by telephone for follow up visit in response to provider referral for social work chronic care management and care coordination services.  Assessment: Review of patient past medical history, allergies, medications, and health status, including review of pertinent consultant reports was performed as part of comprehensive evaluation and provision of care management/care coordination services.   SDOH (Social Determinants of Health) assessments and interventions performed:  No    Advanced Directives Status: Not addressed in this encounter.  Care Plan  Allergies  Allergen Reactions   Humira [Adalimumab] Anaphylaxis   Ampicillin Hives and Itching   Codeine    Compazine [Prochlorperazine Maleate] Other (See Comments)    "Stroke-like symptoms" Can tolerate Phenergan.   Cymbalta [Duloxetine Hcl] Other (See Comments)    Fainting   Imitrex [Sumatriptan Base] Hives and Nausea And Vomiting   Lyrica [Pregabalin] Other (See Comments)    Fainting   Metoclopramide Hcl Hives   Percocet [Oxycodone-Acetaminophen] Itching   Effexor [Venlafaxine  Hydrochloride] Hives and Palpitations   Fentanyl Nausea And Vomiting   Tape Itching and Rash    Outpatient Encounter Medications as of 01/19/2022  Medication Sig Note   acetaminophen (TYLENOL) 500 MG tablet Take 1 tablet (500 mg total) by mouth every 8 (eight) hours for 14 days. Do not take more than 3500 mg of Tylenol daily    albuterol (VENTOLIN HFA) 108 (90 Base) MCG/ACT inhaler Inhale 2 puffs into the lungs every 6 (six) hours as needed for wheezing or shortness of breath.    amitriptyline (ELAVIL) 50 MG tablet Take 1 tablet (50 mg total) by mouth at bedtime.    busPIRone (BUSPAR) 5 MG tablet Take 1 tablet (5 mg total) by mouth 2 (two) times daily. (Patient taking differently: Take 5 mg by mouth at bedtime.)    cetirizine (ZYRTEC) 10 MG tablet Take 10 mg by mouth daily.    dicyclomine (BENTYL) 20 MG tablet Take 20 mg by mouth 4 (four) times daily -  before meals and at bedtime. Up to four times daily as needed 07/09/2020: Patient is taking 1-2 times daily    docusate sodium (COLACE) 100 MG capsule Take 100 mg by mouth daily.    eszopiclone (LUNESTA) 2 MG TABS tablet TAKE ONE TABLET by MOUTH AT bedtime as needed FOR SLEEP. Take immediately BEFORE bedtime    folic acid (FOLVITE) 1 MG tablet Take 3 mg by mouth daily.    gabapentin (NEURONTIN) 300 MG capsule Take 1 capsule (300 mg total) by mouth 2 (two) times daily for 14 days. For 2 weeks post op for pain.    golimumab (SIMPONI ARIA) 50 MG/4ML SOLN injection 50 mg See admin instructions. Infusion every 8 weeks, [facility]  HYDROcodone-acetaminophen (NORCO) 5-325 MG tablet Take 1-2 tablets by mouth every 6 (six) hours as needed for severe pain.    meloxicam (MOBIC) 15 MG tablet Take 1 tablet (15 mg total) by mouth daily for 14 days. For pain and inflammation    methotrexate (50 MG/ML) 1 g injection Inject into the vein every Monday. 0.39m every Monday    olmesartan (BENICAR) 20 MG tablet Take 1 tablet (20 mg total) by mouth daily.     omeprazole (PRILOSEC) 40 MG capsule Take 1 capsule (40 mg total) by mouth daily.    ondansetron (ZOFRAN) 4 MG tablet Take 1 tablet (4 mg total) by mouth every 8 (eight) hours as needed for up to 7 days for nausea or vomiting.    OVER THE COUNTER MEDICATION Aleve cream for pain    polyethylene glycol (MIRALAX / GLYCOLAX) 17 g packet Take 17 g by mouth daily as needed.    tiZANidine (ZANAFLEX) 2 MG tablet Take 2 mg by mouth 3 (three) times daily. 07/09/2020: Patient is taking once daily at bedtime    Vitamin D, Ergocalciferol, (DRISDOL) 1.25 MG (50000 UNIT) CAPS capsule Take 1 capsule (50,000 Units total) by mouth 2 (two) times a week. (Patient taking differently: Take 50,000 Units by mouth 2 (two) times a week. Monday and Wednesday)    [DISCONTINUED] inFLIXimab (REMICADE) 100 MG injection Inject 100 mg into the vein every 30 (thirty) days. Infuse by intravenous route every 4 weeks  Patient is receiving Inflectra brand verses Remicade brand due to insurance (Patient not taking: Reported on 04/10/2020)    [DISCONTINUED] temazepam (RESTORIL) 7.5 MG capsule Take 1 capsule (7.5 mg total) by mouth at bedtime as needed for sleep. (Patient not taking: Reported on 07/09/2020)    No facility-administered encounter medications on file as of 01/19/2022.    Patient Active Problem List   Diagnosis Date Noted   Right-sided low back pain with right-sided sciatica 05/06/2019   Right sided numbness 03/27/2019   Paresthesia 12/25/2018   Essential hypertension 06/26/2018   Abdominal pain, chronic, right lower quadrant 01/06/2015   Nausea and vomiting 01/05/2015   [place]ative [place] 01/05[place]   Enteritis    Nausea with vomiting    Intractable nausea and vomiting 04/29/2014   Benign paroxysmal positional vertigo 03/14/2013   Lymphedema of arm 12/30/2011    Conditions to be addressed/monitored: HTN;   Care Plan : Social Work Plan of Care  Updates made by HDaneen Schicksince 01/19/2022 12:00 AM   Completed 01/19/2022   Problem: Caregiver Stress Resolved 01/19/2022     Long-Range Goal: Caregiver Coping Optimized Completed 01/19/2022  Start Date: 09/24/2021  Recent Progress: On track  Priority: High  Note:   Current Barriers:  Chronic disease management support and education needs related to  HTN, Ulcerative Colitis, Vitamin D Deficiency   Caregiver Stress related to caring for relative with Alzheimer's Disease  Social Worker Clinical Goal(s):  patient will work with SW to identify and address any acute and/or chronic care coordination needs related to the self health management of  HTN, Ulcerative Colitis, Vitamin D Deficiency   patient will work with SW to address concerns related to caregiver stress SW Interventions:  Inter-disciplinary care team collaboration (see longitudinal plan of care) Collaboration with MMinette BrineFNP regarding development and update of comprehensive plan of care as evidenced by provider attestation and co-signature Telephonic visit completed with the patient to assess for care coordination needs Discussed the patient underwent surgery on 5/11 to address ongoing injury  to her arm Patient has returned home and has a scheduled post-op visit tomorrow 5/18 Patient reports she is taking diaudid for pain and has experienced som nausea from this medication. Her surgeon has also prescribed zofran as well as a stool softener Encouraged the patient to speak with her surgeon regarding any medical concerns related to surgery Performed chart review to note upcoming appointments with both RN Care Manager and PharmD Advised the patient she is not scheduled to see her primary care provider in the next year Discussed the patient canceled her past appointment due to her arm and plans to call to schedule a follow up visit once her arm is healed Discussed plan for patient to contact his SW as needed with future care coordination needs - patient agreeable to plan  Patient  Goals/Self-Care Activities patient will:  -Contact surgeons office as needed regarding medical questions related to her arm -Contact Primary Care Provider as needed to schedule an appointment -Engage with RN Care Manager regarding care management needs  - Contact SW as needed        Follow Up Plan:  No planned follow up scheduled at this time. The patient will remain engaged with RN Care Manager and contact SW as needed.      [name], BSW, CDP Social Worker, Certified Dementia Practitioner [facility] [phone]

## 2022-01-19 NOTE — Telephone Encounter (Signed)
?  Care Management  ? ?Follow Up Note ? ? ?01/19/2022 ?Name: Gabrielle Hawkins MRN: [id number] DOB: July 29, 1966 ? ? ?Referred by: [name], FNP ?Reason for referral : Care Coordination (Unsuccessful call) ? ? ?An unsuccessful telephone outreach was attempted today. The patient was referred to the case management team for assistance with care management and care coordination.  ? ?Follow Up Plan: A HIPPA compliant phone message was left for the patient providing contact information and requesting a return call. The care management team will attempt to outreach the patient over the next 14 days. ? ?[name], BSW, CDP ?Social Worker, Certified Dementia Practitioner ?[facility] ?[phone] ? ?   ? ? ?

## 2022-01-19 NOTE — Patient Instructions (Signed)
Visit Information ? ?Thank you for taking time to visit with me today. Please don't hesitate to contact me if I can be of assistance to you before our next scheduled telephone appointment. ? ?Following are the goals we discussed today:  ?Patient Goals/Self-Care Activities ?patient will:  ?-Contact surgeons office as needed regarding medical questions related to her arm ?-Contact Primary Care Provider as needed to schedule an appointment ?-Engage with RN Care Manager regarding care management needs ? - Contact SW as needed ? ? ?Please call the care guide team at [phone] if you need to schedule an appointment with me ? ?If you are experiencing a Mental Health or [place] or need someone to talk to, please call the Suicide and Crisis Lifeline: 988  ? ?Patient verbalizes understanding of instructions and care plan provided today and agrees to view in [place]. Active MyChart status and patient understanding of how to access instructions and care plan via MyChart confirmed with patient.    ? ?No follow up planned at this time. Please contact me as needed. ? ?[name], BSW, CDP ?Social Worker, Certified Dementia Practitioner ?[facility] ?[phone] ? ?   ?  ?

## 2022-01-27 ENCOUNTER — Telehealth: Payer: Medicare Other

## 2022-02-03 ENCOUNTER — Telehealth: Payer: Medicare Other

## 2022-02-03 ENCOUNTER — Ambulatory Visit: Payer: Self-pay

## 2022-02-03 DIAGNOSIS — E559 Vitamin D deficiency, unspecified: Secondary | ICD-10-CM

## 2022-02-03 DIAGNOSIS — F3289 Other specified depressive episodes: Secondary | ICD-10-CM

## 2022-02-03 DIAGNOSIS — I2510 Atherosclerotic heart disease of native coronary artery without angina pectoris: Secondary | ICD-10-CM

## 2022-02-03 DIAGNOSIS — I10 Essential (primary) hypertension: Secondary | ICD-10-CM

## 2022-02-03 DIAGNOSIS — K5180 Other ulcerative colitis without complications: Secondary | ICD-10-CM

## 2022-02-03 NOTE — Chronic Care Management (AMB) (Signed)
Care Management    RN Visit Note  02/03/2022 Name: Gabrielle Hawkins MRN: [id number] DOB: 1966-08-24  Subjective: Gabrielle Hawkins is a 56 y.o. year old female who is a primary care patient of Gabrielle Hawkins, Gabrielle Hawkins. The care management team was consulted for assistance with disease management and care coordination needs.    Engaged with patient by telephone for follow up visit in response to provider referral for case management and/or care coordination services.   Consent to Services:   Gabrielle Hawkins was given information about Care Management services today including:  Care Management services includes personalized support from designated clinical staff supervised by her physician, including individualized plan of care and coordination with other care providers 24/7 contact phone numbers for assistance for urgent and routine care needs. The patient may stop case management services at any time by phone call to the office staff.  Patient agreed to services and consent obtained.   Assessment: Review of patient past medical history, allergies, medications, health status, including review of consultants reports, laboratory and other test data, was performed as part of comprehensive evaluation and provision of chronic care management services.   SDOH (Social Determinants of Health) assessments and interventions performed:  Yes, no acute changes  Care Plan  Allergies  Allergen Reactions   Humira [Adalimumab] Anaphylaxis   Ampicillin Hives and Itching   Codeine    Compazine [Prochlorperazine Maleate] Other (See Comments)    "Stroke-like symptoms" Can tolerate Phenergan.   Cymbalta [Duloxetine Hcl] Other (See Comments)    Fainting   Imitrex [Sumatriptan Base] Hives and Nausea And Vomiting   Lyrica [Pregabalin] Other (See Comments)    Fainting   Metoclopramide Hcl Hives   Percocet [Oxycodone-Acetaminophen] Itching   Effexor [Venlafaxine Hydrochloride] Hives and Palpitations    Fentanyl Nausea And Vomiting   Tape Itching and Rash    Outpatient Encounter Medications as of 02/03/2022  Medication Sig Note   albuterol (VENTOLIN HFA) 108 (90 Base) MCG/ACT inhaler Inhale 2 puffs into the lungs every 6 (six) hours as needed for wheezing or shortness of breath.    amitriptyline (ELAVIL) 50 MG tablet Take 1 tablet (50 mg total) by mouth at bedtime.    busPIRone (BUSPAR) 5 MG tablet Take 1 tablet (5 mg total) by mouth 2 (two) times daily. (Patient taking differently: Take 5 mg by mouth at bedtime.)    cetirizine (ZYRTEC) 10 MG tablet Take 10 mg by mouth daily.    dicyclomine (BENTYL) 20 MG tablet Take 20 mg by mouth 4 (four) times daily -  before meals and at bedtime. Up to four times daily as needed 07/09/2020: Patient is taking 1-2 times daily    docusate sodium (COLACE) 100 MG capsule Take 100 mg by mouth daily.    eszopiclone (LUNESTA) 2 MG TABS tablet TAKE ONE TABLET by MOUTH AT bedtime as needed FOR SLEEP. Take immediately BEFORE bedtime    folic acid (FOLVITE) 1 MG tablet Take 3 mg by mouth daily.    gabapentin (NEURONTIN) 300 MG capsule Take 1 capsule (300 mg total) by mouth 2 (two) times daily for 14 days. For 2 weeks post op for pain.    golimumab (SIMPONI ARIA) 50 MG/4ML SOLN injection 50 mg See admin instructions. Infusion every 8 weeks, [facility]    HYDROcodone-acetaminophen (NORCO) 5-325 MG tablet Take 1-2 tablets by mouth every 6 (six) hours as needed for severe pain.    methotrexate (50 MG/ML) 1 g injection Inject into the vein every Monday.  0.20m every Monday    olmesartan (BENICAR) 20 MG tablet Take 1 tablet (20 mg total) by mouth daily.    omeprazole (PRILOSEC) 40 MG capsule Take 1 capsule (40 mg total) by mouth daily.    OVER THE COUNTER MEDICATION Aleve cream for pain    polyethylene glycol (MIRALAX / GLYCOLAX) 17 g packet Take 17 g by mouth daily as needed.    tiZANidine (ZANAFLEX) 2 MG tablet Take 2 mg by mouth 3 (three) times daily. 07/09/2020:  Patient is taking once daily at bedtime    Vitamin D, Ergocalciferol, (DRISDOL) 1.25 MG (50000 UNIT) CAPS capsule Take 1 capsule (50,000 Units total) by mouth 2 (two) times a week. (Patient taking differently: Take 50,000 Units by mouth 2 (two) times a week. Monday and Wednesday)    [DISCONTINUED] inFLIXimab (REMICADE) 100 MG injection Inject 100 mg into the vein every 30 (thirty) days. Infuse by intravenous route every 4 weeks  Patient is receiving Inflectra brand verses Remicade brand due to insurance (Patient not taking: Reported on 04/10/2020)    [DISCONTINUED] temazepam (RESTORIL) 7.5 MG capsule Take 1 capsule (7.5 mg total) by mouth at bedtime as needed for sleep. (Patient not taking: Reported on 07/09/2020)    No facility-administered encounter medications on file as of 02/03/2022.    Patient Active Problem List   Diagnosis Date Noted   Right-sided low back pain with right-sided sciatica 05/06/2019   Right sided numbness 03/27/2019   Paresthesia 12/25/2018   Essential hypertension 06/26/2018   Abdominal pain, chronic, right lower quadrant 01/06/2015   Nausea and vomiting 01/05/2015   Ulcerative colitis ([place] 01/05/2015   Enteritis    Nausea with vomiting    Intractable nausea and vomiting 04/29/2014   Benign paroxysmal positional vertigo 03/14/2013   Lymphedema of arm 12/30/2011    Conditions to be addressed/monitored:  Ulcerative Colitis, Vitamin D deficiency, Atherosclerotic Cardiovascular disease, depression, HTN  Care Plan : RN Care Manager Plan of Care  Updates made by LLynne Logan RN since 02/03/2022 12:00 AM     Problem: No plan established for management of chronic disease states (Ulcerative Colitis, Vitamin D deficiency)   Priority: High     Long-Range Goal: Development of plan of care for chronic disease management for Ulcerative Colitis, Vitamin D deficiency   Start Date: 07/13/2021  Expected End Date: 07/14/2023  Recent Progress: On track  Priority: High  Note:    Current Barriers:  Knowledge Deficits related to plan of care for management of Ulcerative Colitis, Vitamin D deficiency Chronic Disease Management support and education needs related to Ulcerative Colitis, Vitamin D deficiency Caregiver Stress   RNCM Clinical Goal(s):  Patient will verbalize basic understanding of  Ulcerative Colitis, Vitamin D deficiency disease process and self health management plan   take all medications exactly as prescribed and will call provider for medication related questions demonstrate Ongoing health management independence   continue to work with RN Care Manager to address care management and care coordination needs related to  Ulcerative Colitis, Vitamin D deficiency will demonstrate ongoing self health care management ability    through collaboration with RN Care manager, provider, and care team.   Interventions: 1:1 collaboration with primary care provider regarding development and update of comprehensive plan of care as evidenced by provider attestation and co-signature Inter-disciplinary care team collaboration (see longitudinal plan of care) Evaluation of current treatment plan related to  self management and patient's adherence to plan as established by provider  Ulcerative Colitis Interventions: Status: (Condition  stable.  Not addressed this visit.) Long Term Goal  Evaluation of current treatment plan related to  Ulcerative Colitis , self-management and patient's adherence to plan as established by provider. Discussed plans with patient for ongoing care management follow up and provided patient with direct contact information for care management team Reviewed medications with patient and discussed indication, dosage and frequency of prescribed medications for treatment of UC; educated on potential SE such as increased risk for infection and when to call the doctor if needed; Reviewed scheduled/upcoming provider appointments including; Social Work referral  for caregiver resources ; Discussed plans with patient for ongoing care management follow up and provided patient with direct contact information for care management team; Screening for signs and symptoms of depression related to chronic disease state;   Vitamin D deficiency Interventions: Status: (Condition stable.  Not addressed this visit.) Long Term Goal  Evaluation of current treatment plan related to  Vitamin D deficiency , self-management and patient's adherence to plan as established by provider. Review of patient status, including review of consultant's reports, relevant laboratory and other test results, and medications completed. Reviewed medications with patient and discussed indication, dosage and frequency of prescribed Vitamin D and when retesting is recommending; Educated on importance to get at least 15 minutes of natural sunlight when possible, eat Vitamin D rich foods ; Discussed plans with patient for ongoing care management follow up and provided patient with direct contact information for care management team  LVH (left ventricular hypertrophy Interventions:  (Status:  Condition stable.  Not addressed this visit.)  Short Term Goal Evaluation of current treatment plan related to  left ventricular hypertrophy , self-management and patient's adherence to plan as established by provider Discussed and reviewed recent ED visit due to patient was experiencing chest pain and shortness of breath  Review of patient status, including review of consultant's reports, relevant laboratory and other test results, and medications completed Reviewed and discussed recent Cardiology follow up completed with Dr. Harl Bowie on 10/06/21 with the following Assessment/Plan noted:   PLAN:     TTE Follow up 3 month Medication Adjustments/Labs and Tests Ordered: Current medicines are reviewed at length with the patient today.  Concerns regarding medicines are outlined above.     Orders Placed This  Encounter  Procedures   EKG 12-Lead   ECHOCARDIOGRAM COMPLETE    Patient Instructions  Medication Instructions:  No Changes In Medications at this time Determined patient verbalizes understanding of her prescribed treatment recommendations  Educated patient on sign/symptoms suggestive of worsening condition and when to seek medical attention or call 911 Reviewed scheduled/upcoming provider appointments including: Echocardiogram scheduled for 10/19/21 @8 :69 AM Discussed plans with patient for ongoing care management follow up and provided patient with direct contact information for care management team   Closed fracture dislocation of right elbow:  (Status:  Goal on track:  Yes.)  Long Term Goal Evaluation of current treatment plan related to  elbow pain secondary to closed fx dislocation of right elbow  , self-management and patient's adherence to plan as established by provider Determined patient underwent an ANTERIOR INTEROSSEOUS NERVE DECOMPRESSION HARDWARE REMOVAL/ELBOW on 01/13/22 completed by Dr. Griffin Basil Determined patient has a post surgical appointment today for suture removal, reports wound is healing, she is performing ROM as directed by Dr. Griffin Basil Determined patient is controlling pain with prescribed pain meds, plan to resume exercise at home as directed by Griffin Basil due to having no medical insurance to cover outpatient PT Assessed for SDOH barriers, none identified  at this time, parents are providing transportation and providing assistance as needed   Discussed plans with patient for ongoing care management follow up and provided patient with direct contact information for care management team   Patient Goals/Self-Care Activities: Take all medications as prescribed Attend all scheduled provider appointments Call pharmacy for medication refills 3-7 days in advance of running out of medications Perform all self care activities independently  Perform IADL's (shopping, preparing meals,  housekeeping, managing finances) independently Call provider office for new concerns or questions   Follow Up Plan:  Telephone follow up appointment with care management team member scheduled for:  03/10/22     Barb Merino, RN, BSN, CCM Care Management Coordinator [facility]  Direct Phone: [phone]

## 2022-02-14 ENCOUNTER — Telehealth: Payer: Self-pay

## 2022-02-14 NOTE — Chronic Care Management (AMB) (Signed)
    Called Gabrielle Hawkins, No answer, left message of appointment on 02-16-2022 at 11:15 via telephone visit with Orlando Penner, Pharm D. Notified to have all medications, supplements, blood pressure and/or blood sugar logs available during appointment and to return call if need to reschedule.    [place] Pharmacist Assistant [phone]

## 2022-02-16 ENCOUNTER — Ambulatory Visit: Payer: 59

## 2022-02-16 DIAGNOSIS — G4700 Insomnia, unspecified: Secondary | ICD-10-CM

## 2022-02-16 DIAGNOSIS — I10 Essential (primary) hypertension: Secondary | ICD-10-CM

## 2022-02-16 NOTE — Progress Notes (Addendum)
Chronic Care Management Pharmacy Note  02/22/2022 Name:  Gabrielle Hawkins MRN:  [id number] DOB:  1965/11/23  Summary: Patient reports that she no longer has insurance   Recommendations/Changes made from today's visit: Recommend patient have lab work completed in three months and schedule an office visit to be seen due to her lapse in coverage.   Plan: Patient would like a follow up visit in about three months with Dr. Laurance Flatten.     Subjective: Gabrielle Hawkins is an 56 y.o. year old female who is a primary patient of [name], [place].  The CCM team was consulted for assistance with disease management and care coordination needs.    Engaged with patient by telephone for follow up visit in response to provider referral for pharmacy case management and/or care coordination services.   Consent to Services:  The patient was given information about Chronic Care Management services, agreed to services, and gave verbal consent prior to initiation of services.  Please see initial visit note for detailed documentation.   Patient Care Team: [name], FNP as PCP - General (General Practice) Janina Mayo, MD as PCP - Cardiology (Cardiology) Gavin Pound, MD as Consulting Physician (Rheumatology) [name] as [facility] [place], [name], RN as [place]  Recent office visits: No recent office visits   Recent consult visits: 01/05/2022 PCP [facility] visits: None in previous 6 months   Objective:  Lab Results  Component Value Date   CREATININE 0.81 09/21/2021   BUN 13 09/21/2021   GFR 102.57 02/14/2013   EGFR 82 11/17/2020   GFRNONAA >60 09/21/2021   GFRAA 86 02/18/2020   NA 136 09/21/2021   K 4.0 09/21/2021   CALCIUM 8.8 (L) 09/21/2021   CO2 26 09/21/2021   GLUCOSE 87 09/21/2021    Lab Results  Component Value Date/Time   GFR 102.57 02/14/2013 02:49 PM   MICROALBUR 10 06/04/2019 12:03 PM     Last diabetic Eye exam: No results found for: "HMDIABEYEEXA"  Last diabetic Foot exam: No results found for: "HMDIABFOOTEX"   Lab Results  Component Value Date   CHOL 204 (H) 11/17/2020   HDL 60 11/17/2020   LDLCALC 130 (H) 11/17/2020   TRIG 80 11/17/2020   CHOLHDL 3.4 11/17/2020       Latest Ref Rng & Units 01/14/2021    1:05 PM 06/27/2020   12:44 PM 10/06/2018   10:25 PM  Hepatic Function  Total Protein 6.5 - 8.1 g/dL 8.0  7.6  6.9   Albumin 3.5 - 5.0 g/dL 3.8  3.9  3.7   AST 15 - 41 U/L 18  17  14    ALT 0 - 44 U/L 18  20  12    Alk Phosphatase 38 - 126 U/L 88  81  73   Total Bilirubin 0.3 - 1.2 mg/dL 0.6  0.7  0.5     Lab Results  Component Value Date/Time   TSH 1.640 11/17/2020 03:25 PM   TSH 2.160 03/22/2013 01:42 AM   FREET4 1.12 07/08/2011 12:04 PM       Latest Ref Rng & Units 09/21/2021    3:16 PM 01/14/2021    1:05 PM 06/27/2020   12:44 PM  CBC  WBC 4.0 - 10.5 K/uL 5.2  6.6  7.3   Hemoglobin 12.0 - 15.0 g/dL 13.7  12.7  13.2   Hematocrit 36.0 - 46.0 % 42.7  40.7  41.9   Platelets 150 -  400 K/uL 185  209  204     Lab Results  Component Value Date/Time   VD25OH 15.0 (L) 11/17/2020 03:25 PM   VD25OH 66 07/30/2012 03:52 PM    Clinical ASCVD: No  The 10-year ASCVD risk score (Arnett DK, et al., 2019) is: 6.3%   Values used to calculate the score:     Age: 71 years     Sex: Female     Is Non-Hispanic African American: Yes     Diabetic: No     Tobacco smoker: No     Systolic Blood Pressure: 440 mmHg     Is BP treated: Yes     HDL Cholesterol: 60 mg/dL     Total Cholesterol: 204 mg/dL       09/27/2021   11:45 AM 04/21/2021    3:13 PM 11/17/2020    2:38 PM  Depression screen PHQ 2/9  Decreased Interest 1 0 0  Down, Depressed, Hopeless 3 0 3  PHQ - 2 Score 4 0 3  Altered sleeping 3  3  Tired, decreased energy 3  3  Change in appetite 1  3  Feeling bad or failure about yourself  0  0  Trouble concentrating 1  0  Moving slowly or fidgety/restless 3   0  Suicidal thoughts 0  0  PHQ-9 Score 15  12  Difficult doing work/chores Somewhat difficult  Not difficult at all     Social History   Tobacco Use  Smoking Status Never  Smokeless Tobacco Never   BP Readings from Last 3 Encounters:  01/13/22 (!) 143/94  01/05/22 134/88  11/15/21 (!) 142/82   Pulse Readings from Last 3 Encounters:  01/13/22 78  01/05/22 86  11/15/21 89   Wt Readings from Last 3 Encounters:  01/06/22 195 lb (88.5 kg)  01/05/22 195 lb 12.8 oz (88.8 kg)  11/15/21 182 lb 15.7 oz (83 kg)   BMI Readings from Last 3 Encounters:  01/06/22 29.22 kg/m  01/05/22 29.34 kg/m  11/15/21 27.42 kg/m    Assessment/Interventions: Review of patient past medical history, allergies, medications, health status, including review of consultants reports, laboratory and other test data, was performed as part of comprehensive evaluation and provision of chronic care management services.   SDOH:  (Social Determinants of Health) assessments and interventions performed: Yes  SDOH Screenings   Alcohol Screen: Not on file  Depression (PHQ2-9): Medium Risk (09/27/2021)   Depression (PHQ2-9)    PHQ-2 Score: 15  Financial Resource Strain: Medium Risk (05/05/2020)   Overall Financial Resource Strain (CARDIA)    Difficulty of Paying Living Expenses: Somewhat hard  Food Insecurity: No Food Insecurity (02/09/2021)   Hunger Vital Sign    Worried About Running Out of Food in the Last Year: Never true    Ran Out of Food in the Last Year: Never true  Housing: Low Risk  (02/09/2021)   Housing    Last Housing Risk Score: 0  Physical Activity: Sufficiently Active (06/04/2019)   Exercise Vital Sign    Days of Exercise per Week: 7 days    Minutes of Exercise per Session: 30 min  Social Connections: Not on file  Stress: No Stress Concern Present (06/04/2019)   [place]    Feeling of Stress : Not at all  Tobacco Use: Low Risk   (01/14/2022)   Patient History    Smoking Tobacco Use: Never    Smokeless Tobacco Use: Never  Passive Exposure: Not on file  Transportation Needs: No Transportation Needs (02/09/2021)   PRAPARE - Hydrologist (Medical): No    Lack of Transportation (Non-Medical): No    CCM Care Plan  Allergies  Allergen Reactions   Humira [Adalimumab] Anaphylaxis   Ampicillin Hives and Itching   Codeine    Compazine [Prochlorperazine Maleate] Other (See Comments)    "Stroke-like symptoms" Can tolerate Phenergan.   Cymbalta [Duloxetine Hcl] Other (See Comments)    Fainting   Imitrex [Sumatriptan Base] Hives and Nausea And Vomiting   Lyrica [Pregabalin] Other (See Comments)    Fainting   Metoclopramide Hcl Hives   Percocet [Oxycodone-Acetaminophen] Itching   Effexor [Venlafaxine Hydrochloride] Hives and Palpitations   Fentanyl Nausea And Vomiting   Tape Itching and Rash    Medications Reviewed Today     Reviewed by Mayford Knife, RPH (Pharmacist) on 02/16/22 at 1445  Med List Status: <None>   Medication Order Taking? Sig Documenting Provider Last Dose Status Informant  albuterol (VENTOLIN HFA) 108 (90 Base) MCG/ACT inhaler [id number] No Inhale 2 puffs into the lungs every 6 (six) hours as needed for wheezing or shortness of breath. [name], FNP More than a month Active Self  amitriptyline (ELAVIL) 50 MG tablet [id number] No Take 1 tablet (50 mg total) by mouth at bedtime. [name], FNP 01/12/2022 Active   busPIRone (BUSPAR) 5 MG tablet [id number] No Take 1 tablet (5 mg total) by mouth 2 (two) times daily.  Patient taking differently: Take 5 mg by mouth at bedtime.   [name], FNP 01/12/2022 Active   cetirizine (ZYRTEC) 10 MG tablet [id number] No Take 10 mg by mouth daily. [provider] 01/12/2022 Active Self  dicyclomine (BENTYL) 20 MG tablet [id number] No Take 20 mg by mouth 4 (four) times daily -  before meals and at bedtime. Up to four  times daily as needed [provider] Past Week Active Self           Med Note (LITTLE, ANGEL L   Thu Jul 09, 2020 11:43 AM) Patient is taking 1-2 times daily   docusate sodium (COLACE) 100 MG capsule [id number] No Take 100 mg by mouth daily. [provider] Past Week Active   eszopiclone (LUNESTA) 2 MG TABS tablet [id number] No TAKE ONE TABLET by MOUTH AT bedtime as needed FOR SLEEP. Take immediately BEFORE bedtime [name], FNP 9/47/0962 Active   folic acid (FOLVITE) 1 MG tablet [id number] No Take 3 mg by mouth daily. [provider] Past Week Active Self  gabapentin (NEURONTIN) 300 MG capsule [id number]  Take 1 capsule (300 mg total) by mouth 2 (two) times daily for 14 days. For 2 weeks post op for pain. Ethelda Chick, PA-C  Expired 01/27/22 2359   golimumab (SIMPONI ARIA) 50 MG/4ML SOLN injection [id number] No 50 mg See admin instructions. Infusion every 8 weeks, [facility] [provider] More than a month Active Self  HYDROcodone-acetaminophen (NORCO) 5-325 MG tablet [id number]  Take 1-2 tablets by mouth every 6 (six) hours as needed for severe pain. Ethelda Chick, [place]  Active   Patient not taking:  Discontinued 11/02/20 0709          Med Note (LITTLE, ANGEL L   Wed Feb 26, 2020 10:00 AM)    methotrexate (50 MG/ML) 1 g injection [id number] No Inject into the vein every Monday. 0.61m every Monday [provider] Past Week Active Self  olmesartan (BENICAR) 20 MG  tablet [id number] No Take 1 tablet (20 mg total) by mouth daily. [name], FNP 01/12/2022 Active Self  omeprazole (PRILOSEC) 40 MG capsule [id number] No Take 1 capsule (40 mg total) by mouth daily. [name], MD 01/12/2022 Active Self  OVER THE COUNTER MEDICATION [id number] No Aleve cream for pain [provider] Past Month Active Self  polyethylene glycol (MIRALAX / GLYCOLAX) 17 g packet [id number] No Take 17 g by mouth daily as needed. [provider] Past Week Active   Patient not taking:  Discontinued 11/02/20 0709   tiZANidine (ZANAFLEX) 2 MG tablet [id number] No Take 2 mg by mouth 3 (three) times daily. [provider] 01/12/2022 Active Self           Med Note (LITTLE, ANGEL L   Thu Jul 09, 2020 11:44 AM) Patient is taking once daily at bedtime   Vitamin D, Ergocalciferol, (DRISDOL) 1.25 MG (50000 UNIT) CAPS capsule [id number] No Take 1 capsule (50,000 Units total) by mouth 2 (two) times a week.  Patient taking differently: Take 50,000 Units by mouth 2 (two) times a week. Monday and Wednesday   [name], FNP Past Week Active Self            Patient Active Problem List   Diagnosis Date Noted   Right-sided low back pain with right-sided sciatica 05/06/2019   Right sided numbness 03/27/2019   Paresthesia 12/25/2018   Essential hypertension 06/26/2018   Abdominal pain, chronic, right lower quadrant 01/06/2015   Nausea and vomiting 01/05/2015   Ulcerative colitis ([place]) 01/05/2015   Enteritis    Nausea with vomiting    Intractable nausea and vomiting 04/29/2014   Benign paroxysmal positional vertigo 03/14/2013   Lymphedema of arm 12/30/2011    Immunization History  Administered Date(s) Administered   Influenza,inj,Quad PF,6+ Mos 06/04/2019   Influenza-Unspecified 08/09/2018, 06/08/2020   Moderna Sars-Covid-2 Vaccination 11/13/2019, 12/11/2019, 06/08/2020   Tdap 05/01/2021    Conditions to be addressed/monitored:  Hypertension and Hyperlipidemia  Care Plan : [place]  Updates made by Mayford Knife, [place] since 02/22/2022 12:00 AM     Problem: Hypertension, Insomnia      Goal: Disease Management   Note:    Current Barriers:  Unable to independently monitor therapeutic efficacy  Pharmacist Clinical Goal(s):  Patient will achieve adherence to monitoring guidelines and medication adherence to achieve therapeutic efficacy through collaboration with PharmD and provider.    Interventions: 1:1 collaboration with [name], FNP regarding development and update of comprehensive plan of care as evidenced by provider attestation and co-signature Inter-disciplinary care team collaboration (see longitudinal plan of care) Comprehensive medication review performed; medication list updated in electronic medical record  Hypertension (BP goal <130/80) -Controlled -Current treatment: Olmesartan 20 mg tablet once per day. Appropriate, Effective, Safe, Accessible -Current home readings: patient reports that she is concerned that the wrist cuff is not giving her an accurate measurement.    -Reading: 110/70 -Current dietary habits: she is eating pretty healthy, she is not a big eater  -Denies hypotensive/hypertensive symptoms -Educated on Exercise goal of 150 minutes per week; -Counseled to monitor BP at home at least three times per week, document, and provide log at future appointments -Recommended to continue current medication  Insomnia (Goal: Improve sleep quality) -Controlled -Current treatment  Amitryptiline 50 mg tablet once per day. Appropriate, Effective, Safe, Accessible Lunesta 2 mg once per day. Appropriate, Effective, Safe, Accessible -Your Sleep Hygiene Checklist:   Stop pre-sleep electronic use.  Remove  naps.  Keep fixed bedtime and wake-up  time.  Avoid caffeine and alcohol.  Do relaxing activities pre-sleep.  Make a worry list.  Do boring activities pre-sleep.  Improve your sleeping environment.  Exercise at the right time.  Get up and try again  Learn about sleep -Recommended to continue current medication  Patient Goals/Self-Care Activities Patient will:  - take medications as prescribed as evidenced by patient report and record review  Follow Up Plan: The patient has been provided with contact information for the care management team and has been advised to call with any health related questions or concerns.        Medication  Assistance: None required.  Patient affirms current coverage meets needs.  Compliance/Adherence/Medication fill history: Care Gaps: -HIV screening  -Shringrix Vaccine -COVID-19 Booster   [facility] Drugs: Olmesartan 20 mg tablet   Patient's preferred pharmacy is:  [facility] - [place], [place] - [address] [place] [address] Phone: [phone] Fax: [phone]  [facility] [address], [place] [address] Phone: [phone] Fax: [phone]  Uses pill box? Yes Pt endorses 90% compliance  We discussed: Benefits of medication synchronization, packaging and delivery as well as enhanced pharmacist oversight with [facility]. Patient decided to: Continue current medication management strategy  Care Plan and Follow Up Patient Decision:  Patient agrees to Care Plan and Follow-up.  Plan: The patient has been provided with contact information for the care management team and has been advised to call with any health related questions or concerns.   Orlando Penner, CPP, PharmD Clinical Pharmacist Practitioner [facility] [phone]   Current Barriers:  Unable to independently monitor therapeutic efficacy  Pharmacist Clinical Goal(s):  Patient will achieve adherence to monitoring guidelines and medication adherence to achieve therapeutic efficacy through collaboration with PharmD and provider.   Interventions: 1:1 collaboration with [name], FNP regarding development and update of comprehensive plan of care as evidenced by provider attestation and co-signature Inter-disciplinary care team collaboration (see longitudinal plan of care) Comprehensive medication review performed; medication list updated in electronic medical record  Hypertension (BP goal <130/80) -Controlled -Current treatment: Olmesartan 20 mg tablet once per day.  Appropriate, Effective, Safe, Accessible -Current home readings: patient reports that she is concerned that the wrist cuff is not giving her an accurate measurement.    -Readings are 110/70,  -Current dietary habits: she is eating pretty healthy, she is not a big eater  -Denies hypotensive/hypertensive symptoms -Educated on Exercise goal of 150 minutes per week; -Counseled to monitor BP at home at least three times per week, document, and provide log at future appointments -Recommended to continue current medication  Insomnia (Goal: improve sleep ) -Controlled -Current treatment  Amitryptiline 50 mg tablet once per day. Appropriate, Effective, Safe, Accessible Lunesta 2 mg once per day. Appropriate, Effective, Safe, Accessible -Your Sleep Hygiene Checklist:   Stop pre-sleep electronic use.  Use bed only for S.  Remove naps.  Keep fixed bedtime and wake-up  time.  Avoid caffeine and alcohol.  Do relaxing activities pre-sleep.  Make a worry list.  Do boring activities pre-sleep.  Improve your sleeping environment.  Exercise at the right time.  Get up and try again  Learn about sleep -Recommended to continue current medication  Patient Goals/Self-Care Activities Patient will:  - take medications as prescribed as evidenced by patient report and record review  Follow Up Plan:  The patient has been provided with contact information for the care management team and has been advised to call with any health related questions or concerns.

## 2022-02-22 ENCOUNTER — Encounter: Payer: Self-pay | Admitting: Nurse Practitioner

## 2022-02-22 ENCOUNTER — Telehealth: Payer: Self-pay | Admitting: Nurse Practitioner

## 2022-02-22 NOTE — Patient Instructions (Signed)
Visit Information It was great speaking with you today!  Please let me know if you have any questions about our visit.   Goals Addressed             This Visit's Progress    Manage My Medicine       Timeframe:  Long-Range Goal Priority:  High Start Date:                             Expected End Date:                       Follow Up Date 06/2022    - call for medicine refill 2 or 3 days before it runs out - call if I am sick and can't take my medicine - keep a list of all the medicines I take; vitamins and herbals too - learn to read medicine labels - use a pillbox to sort medicine    Why is this important?   These steps will help you keep on track with your medicines.   Notes:  -Patient to call and let PCP team know when she has insurance so she can be rescheduled for a follow up visit with the PCP team to have a visit, and complete updated labs.        Patient Care Plan: CCM Pharmacy Care Plan     Problem Identified: Hypertension, Insomnia      Goal: Disease Management   Note:    Current Barriers:  Unable to independently monitor therapeutic efficacy  Pharmacist Clinical Goal(s):  Patient will achieve adherence to monitoring guidelines and medication adherence to achieve therapeutic efficacy through collaboration with PharmD and provider.   Interventions: 1:1 collaboration with [name], FNP regarding development and update of comprehensive plan of care as evidenced by provider attestation and co-signature Inter-disciplinary care team collaboration (see longitudinal plan of care) Comprehensive medication review performed; medication list updated in electronic medical record  Hypertension (BP goal <130/80) -Controlled -Current treatment: Olmesartan 20 mg tablet once per day. Appropriate, Effective, Safe, Accessible -Current home readings: patient reports that she is concerned that the wrist cuff is not giving her an accurate measurement.    -Reading:  110/70  -Current dietary habits: she is eating pretty healthy, she is not a big eater  -Denies hypotensive/hypertensive symptoms -Educated on Exercise goal of 150 minutes per week; -Counseled to monitor BP at home at least three times per week, document, and provide log at future appointments -Recommended to continue current medication  Insomnia (Goal:improve sleep ) -Controlled -Current treatment  Amitryptiline 50 mg tablet once per day. Appropriate, Effective, Safe, Accessible Lunesta 2 mg once per day. Appropriate, Effective, Safe, Accessible -Your Sleep Hygiene Checklist:   Stop pre-sleep electronic use.  Remove naps.  Keep fixed bedtime and wake-up  time.  Avoid caffeine and alcohol.  Do relaxing activities pre-sleep.  Make a worry list.  Do boring activities pre-sleep.  Improve your sleeping environment.  Exercise at the right time.  Get up and try again  Learn about sleep -Recommended to continue current medication  Patient Goals/Self-Care Activities Patient will:  - take medications as prescribed as evidenced by patient report and record review  Follow Up Plan: The patient has been provided with contact information for the care management team and has been advised to call with any health related questions or concerns.        Patient agreed to  services and verbal consent obtained.   The patient verbalized understanding of instructions, educational materials, and care plan provided today and agreed to receive a mailed copy of patient instructions, educational materials, and care plan.   Orlando Penner, PharmD Clinical Pharmacist [facility] [phone]

## 2022-02-22 NOTE — Telephone Encounter (Signed)
Called pt to schedule appt and pt stated she has no insurance and can not afford to attend any appts right now.

## 2022-03-10 ENCOUNTER — Telehealth: Payer: Medicare Other

## 2022-03-15 ENCOUNTER — Ambulatory Visit: Payer: Self-pay

## 2022-03-15 ENCOUNTER — Telehealth: Payer: Medicare Other

## 2022-03-15 DIAGNOSIS — I2510 Atherosclerotic heart disease of native coronary artery without angina pectoris: Secondary | ICD-10-CM

## 2022-03-15 DIAGNOSIS — I10 Essential (primary) hypertension: Secondary | ICD-10-CM

## 2022-03-15 DIAGNOSIS — K5180 Other ulcerative colitis without complications: Secondary | ICD-10-CM

## 2022-03-15 DIAGNOSIS — G4700 Insomnia, unspecified: Secondary | ICD-10-CM

## 2022-03-15 DIAGNOSIS — F3289 Other specified depressive episodes: Secondary | ICD-10-CM

## 2022-03-15 DIAGNOSIS — E559 Vitamin D deficiency, unspecified: Secondary | ICD-10-CM

## 2022-03-15 NOTE — Patient Instructions (Signed)
Visit Information  Thank you for taking time to visit with me today. Please don't hesitate to contact me if I can be of assistance to you before our next scheduled telephone appointment.  Following are the goals we discussed today:  Take all medications as prescribed Attend all scheduled provider appointments Call pharmacy for medication refills 3-7 days in advance of running out of medications Perform all self care activities independently  Call provider office for new concerns or questions  check blood pressure 3 times per week choose a place to take my blood pressure (home, clinic or office, retail store) keep a blood pressure log take blood pressure log to all doctor appointments call doctor for signs and symptoms of high blood pressure keep all doctor appointments take medications for blood pressure exactly as prescribed begin an exercise program adhere to prescribed diet: low trans and saturated fats   Our next appointment is by telephone on 07/11/22 at 11:15 AM   Please call the care guide team at [phone] if you need to cancel or reschedule your appointment.   If you are experiencing a Mental Health or [place] or need someone to talk to, please call 1-800-273-TALK (toll free, 24 hour hotline)   Patient verbalizes understanding of instructions and care plan provided today and agrees to view in [place]. Active MyChart status and patient understanding of how to access instructions and care plan via MyChart confirmed with patient.     Barb Merino, RN, BSN, CCM Care Management Coordinator [facility]  Direct Phone: [phone]

## 2022-03-15 NOTE — Chronic Care Management (AMB) (Signed)
Care Management    RN Visit Note  03/15/2022 Name: Gabrielle Hawkins MRN: [id number] DOB: 1966/06/17  Subjective: Gabrielle Hawkins is a 56 y.o. year old female who is a primary care patient of Gabrielle Hawkins, [place]. The care management team was consulted for assistance with disease management and care coordination needs.    Engaged with patient by telephone for follow up visit in response to provider referral for case management and/or care coordination services.   Consent to Services:   Gabrielle Hawkins was given information about Care Management services today including:  Care Management services includes personalized support from designated clinical staff supervised by her physician, including individualized plan of care and coordination with other care providers 24/7 contact phone numbers for assistance for urgent and routine care needs. The patient may stop case management services at any time by phone call to the office staff.  Patient agreed to services and consent obtained.   Assessment: Review of patient past medical history, allergies, medications, health status, including review of consultants reports, laboratory and other test data, was performed as part of comprehensive evaluation and provision of chronic care management services.   SDOH (Social Determinants of Health) assessments and interventions performed:  Yes, no acute needs  Care Plan  Allergies  Allergen Reactions   Humira [Adalimumab] Anaphylaxis   Ampicillin Hives and Itching   Codeine    Compazine [Prochlorperazine Maleate] Other (See Comments)    "Stroke-like symptoms" Can tolerate Phenergan.   Cymbalta [Duloxetine Hcl] Other (See Comments)    Fainting   Imitrex [Sumatriptan Base] Hives and Nausea And Vomiting   Lyrica [Pregabalin] Other (See Comments)    Fainting   Metoclopramide Hcl Hives   Percocet [Oxycodone-Acetaminophen] Itching   Effexor [Venlafaxine Hydrochloride] Hives and Palpitations    Fentanyl Nausea And Vomiting   Tape Itching and Rash    Outpatient Encounter Medications as of 03/15/2022  Medication Sig Note   albuterol (VENTOLIN HFA) 108 (90 Base) MCG/ACT inhaler Inhale 2 puffs into the lungs every 6 (six) hours as needed for wheezing or shortness of breath.    amitriptyline (ELAVIL) 50 MG tablet Take 1 tablet (50 mg total) by mouth at bedtime.    busPIRone (BUSPAR) 5 MG tablet Take 1 tablet (5 mg total) by mouth 2 (two) times daily. (Patient taking differently: Take 5 mg by mouth at bedtime.)    cetirizine (ZYRTEC) 10 MG tablet Take 10 mg by mouth daily.    dicyclomine (BENTYL) 20 MG tablet Take 20 mg by mouth 4 (four) times daily -  before meals and at bedtime. Up to four times daily as needed 07/09/2020: Patient is taking 1-2 times daily    docusate sodium (COLACE) 100 MG capsule Take 100 mg by mouth daily.    eszopiclone (LUNESTA) 2 MG TABS tablet TAKE ONE TABLET by MOUTH AT bedtime as needed FOR SLEEP. Take immediately BEFORE bedtime    folic acid (FOLVITE) 1 MG tablet Take 3 mg by mouth daily.    gabapentin (NEURONTIN) 300 MG capsule Take 1 capsule (300 mg total) by mouth 2 (two) times daily for 14 days. For 2 weeks post op for pain.    golimumab (SIMPONI ARIA) 50 MG/4ML SOLN injection 50 mg See admin instructions. Infusion every 8 weeks, [facility]    HYDROcodone-acetaminophen (NORCO) 5-325 MG tablet Take 1-2 tablets by mouth every 6 (six) hours as needed for severe pain.    methotrexate (50 MG/ML) 1 g injection Inject into the vein every Monday.  0.31m every Monday    olmesartan (BENICAR) 20 MG tablet Take 1 tablet (20 mg total) by mouth daily.    omeprazole (PRILOSEC) 40 MG capsule Take 1 capsule (40 mg total) by mouth daily.    OVER THE COUNTER MEDICATION Aleve cream for pain    polyethylene glycol (MIRALAX / GLYCOLAX) 17 g packet Take 17 g by mouth daily as needed.    tiZANidine (ZANAFLEX) 2 MG tablet Take 2 mg by mouth 3 (three) times daily.  07/09/2020: Patient is taking once daily at bedtime    Vitamin D, Ergocalciferol, (DRISDOL) 1.25 MG (50000 UNIT) CAPS capsule Take 1 capsule (50,000 Units total) by mouth 2 (two) times a week. (Patient taking differently: Take 50,000 Units by mouth 2 (two) times a week. Monday and Wednesday)    [DISCONTINUED] inFLIXimab (REMICADE) 100 MG injection Inject 100 mg into the vein every 30 (thirty) days. Infuse by intravenous route every 4 weeks  Patient is receiving Inflectra brand verses Remicade brand due to insurance (Patient not taking: Reported on 04/10/2020)    [DISCONTINUED] temazepam (RESTORIL) 7.5 MG capsule Take 1 capsule (7.5 mg total) by mouth at bedtime as needed for sleep. (Patient not taking: Reported on 07/09/2020)    No facility-administered encounter medications on file as of 03/15/2022.    Patient Active Problem List   Diagnosis Date Noted   Right-sided low back pain with right-sided sciatica 05/06/2019   Right sided numbness 03/27/2019   Paresthesia 12/25/2018   Essential hypertension 06/26/2018   Abdominal pain, chronic, right lower quadrant 01/06/2015   Nausea and vomiting 01/05/2015   Ulcerative colitis ([place] 01/05/2015   Enteritis    Nausea with vomiting    Intractable nausea and vomiting 04/29/2014   Benign paroxysmal positional vertigo 03/14/2013   Lymphedema of arm 12/30/2011    Conditions to be addressed/monitored: HTN, HLD, Anxiety, Depression, and Vitamin D deficiency, Ulcerative Colitis   Care Plan : RN Care Manager Plan of Care  Updates made by LLynne Logan RN since 03/15/2022 12:00 AM     Problem: No plan established for management of chronic disease states (Ulcerative Colitis, Vitamin D deficiency)   Priority: High     Long-Range Goal: Development of plan of care for chronic disease management for Ulcerative Colitis, Vitamin D deficiency   Start Date: 07/13/2021  Expected End Date: 07/14/2023  Recent Progress: On track  Priority: High  Note:   Current  Barriers:  Knowledge Deficits related to plan of care for management of Ulcerative Colitis, Vitamin D deficiency Chronic Disease Management support and education needs related to Ulcerative Colitis, Vitamin D deficiency Caregiver Stress   RNCM Clinical Goal(s):  Patient will verbalize basic understanding of  Ulcerative Colitis, Vitamin D deficiency disease process and self health management plan   take all medications exactly as prescribed and will call provider for medication related questions demonstrate Ongoing health management independence   continue to work with RN Care Manager to address care management and care coordination needs related to  Ulcerative Colitis, Vitamin D deficiency will demonstrate ongoing self health care management ability    through collaboration with RN Care manager, provider, and care team.   Interventions: 1:1 collaboration with primary care provider regarding development and update of comprehensive plan of care as evidenced by provider attestation and co-signature Inter-disciplinary care team collaboration (see longitudinal plan of care) Evaluation of current treatment plan related to  self management and patient's adherence to plan as established by provider  Ulcerative Colitis Interventions: Status: (Condition  stable.  Not addressed this visit.) Long Term Goal  Evaluation of current treatment plan related to  Ulcerative Colitis , self-management and patient's adherence to plan as established by provider. Discussed plans with patient for ongoing care management follow up and provided patient with direct contact information for care management team Reviewed medications with patient and discussed indication, dosage and frequency of prescribed medications for treatment of UC; educated on potential SE such as increased risk for infection and when to call the doctor if needed; Reviewed scheduled/upcoming provider appointments including; Social Work referral for  caregiver resources ; Discussed plans with patient for ongoing care management follow up and provided patient with direct contact information for care management team; Screening for signs and symptoms of depression related to chronic disease state;   Vitamin D deficiency Interventions: Status: (Condition stable.  Not addressed this visit.) Long Term Goal  Evaluation of current treatment plan related to  Vitamin D deficiency , self-management and patient's adherence to plan as established by provider. Review of patient status, including review of consultant's reports, relevant laboratory and other test results, and medications completed. Reviewed medications with patient and discussed indication, dosage and frequency of prescribed Vitamin D and when retesting is recommending; Educated on importance to get at least 15 minutes of natural sunlight when possible, eat Vitamin D rich foods ; Discussed plans with patient for ongoing care management follow up and provided patient with direct contact information for care management team  LVH (left ventricular hypertrophy Interventions:  (Status:  Condition stable.  Not addressed this visit.)  Short Term Goal Evaluation of current treatment plan related to  left ventricular hypertrophy , self-management and patient's adherence to plan as established by provider Discussed and reviewed recent ED visit due to patient was experiencing chest pain and shortness of breath  Review of patient status, including review of consultant's reports, relevant laboratory and other test results, and medications completed Reviewed and discussed recent Cardiology follow up completed with Dr. Harl Bowie on 10/06/21 with the following Assessment/Plan noted:   PLAN:     TTE Follow up 3 month Medication Adjustments/Labs and Tests Ordered: Current medicines are reviewed at length with the patient today.  Concerns regarding medicines are outlined above.     Orders Placed This Encounter   Procedures   EKG 12-Lead   ECHOCARDIOGRAM COMPLETE    Patient Instructions  Medication Instructions:  No Changes In Medications at this time Determined patient verbalizes understanding of her prescribed treatment recommendations  Educated patient on sign/symptoms suggestive of worsening condition and when to seek medical attention or call 911 Reviewed scheduled/upcoming provider appointments including: Echocardiogram scheduled for 10/19/21 @8 :64 AM Discussed plans with patient for ongoing care management follow up and provided patient with direct contact information for care management team   Closed fracture dislocation of right elbow:  (Status:  Goal Met.)  Long Term Goal Evaluation of current treatment plan related to  elbow pain secondary to closed fx dislocation of right elbow  , self-management and patient's adherence to plan as established by provider Determined patient underwent an ANTERIOR INTEROSSEOUS NERVE DECOMPRESSION HARDWARE REMOVAL/ELBOW on 01/13/22 completed by Dr. Griffin Basil Determined patient has recovered from her elbow fracture and surgical repair, she is following HEP as provided by Orthopedics and has been cleared to resume normal activities Discussed plans with patient for ongoing care management follow up and provided patient with direct contact information for care management team   Hypertension Interventions:  (Status:  New goal.) Long Term Goal Last practice  recorded BP readings:  BP Readings from Last 3 Encounters:  01/13/22 (!) 143/94  01/05/22 134/88  11/15/21 (!) 142/82  Most recent eGFR/CrCl:  Lab Results  Component Value Date   EGFR 82 11/17/2020    No components found for: "CRCL" Evaluation of current treatment plan related to hypertension self management and patient's adherence to plan as established by provider Reviewed medications with patient and discussed importance of compliance Counseled on the importance of exercise goals with target of 150  minutes per week Advised patient, providing education and rationale, to monitor blood pressure daily and record, calling PCP for findings outside established parameters Provided education on prescribed diet low Sodium  Discussed complications of poorly controlled blood pressure such as heart disease, stroke, circulatory complications, vision complications, kidney impairment, sexual dysfunction    Hyperlipidemia Interventions:  (Status:  New goal.) Long Term Goal Provider established cholesterol goals reviewed Provided HLD educational materials Reviewed importance of limiting foods high in cholesterol Reviewed exercise goals and target of 150 minutes per week Lipid Panel     Component Value Date/Time   CHOL 204 (H) 11/17/2020 1525   TRIG 80 11/17/2020 1525   HDL 60 11/17/2020 1525   CHOLHDL 3.4 11/17/2020 1525   CHOLHDL 3.5 12/24/2009 0817   VLDL 10 12/24/2009 0817   LDLCALC 130 (H) 11/17/2020 1525   LABVLDL 14 11/17/2020 1525     Patient Goals/Self-Care Activities: Take all medications as prescribed Attend all scheduled provider appointments Call pharmacy for medication refills 3-7 days in advance of running out of medications Perform all self care activities independently  Call provider office for new concerns or questions  check blood pressure 3 times per week choose a place to take my blood pressure (home, clinic or office, retail store) keep a blood pressure log take blood pressure log to all doctor appointments call doctor for signs and symptoms of high blood pressure keep all doctor appointments take medications for blood pressure exactly as prescribed begin an exercise program adhere to prescribed diet: low trans and saturated fats   Follow Up Plan:  Telephone follow up appointment with care management team member scheduled for:  07/11/22      Barb Merino, RN, BSN, CCM Care Management Coordinator [facility]  Direct Phone:  [phone]

## 2022-03-30 ENCOUNTER — Other Ambulatory Visit: Payer: Self-pay | Admitting: Nurse Practitioner

## 2022-03-30 DIAGNOSIS — G4700 Insomnia, unspecified: Secondary | ICD-10-CM

## 2022-05-04 NOTE — Progress Notes (Signed)
This encounter was created in error - please disregard.

## 2022-05-09 IMAGING — CT CT CERVICAL SPINE W/O CM
3 of 4 series · 12 of 33 positions shown, 14 images · non-contrast
Comparison: MRI head 04/29/2019, CT head November 13, 2006, MRI
cervical spine from March 05, 2013.

CLINICAL DATA: Head trauma.

EXAM:
CT HEAD WITHOUT CONTRAST
CT CERVICAL SPINE WITHOUT CONTRAST
TECHNIQUE: Multidetector CT imaging of the head and cervical spine was
performed following the standard protocol without intravenous
contrast. Multiplanar CT image reconstructions of the cervical spine
were also generated.

[Series 5: sagittal bone · sagittal · 0.26mm/px · 5 of 61 slices shown, 6 images]
[im 21/61  bone]
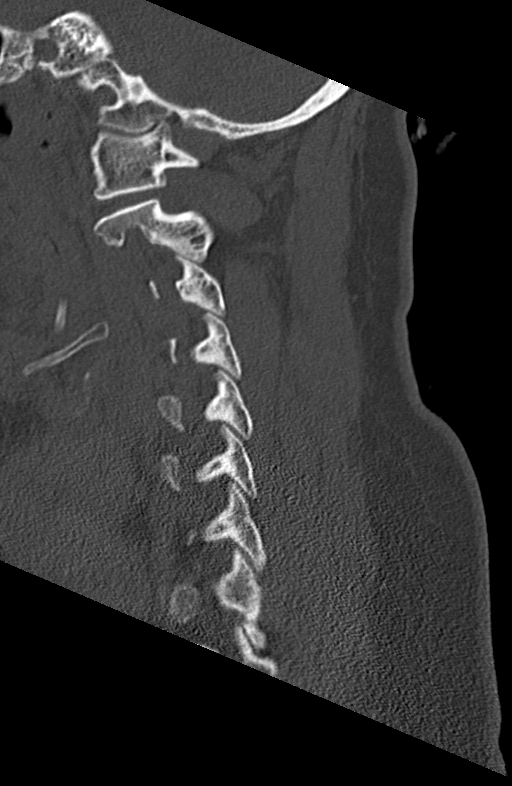
[im 26/61  bone]
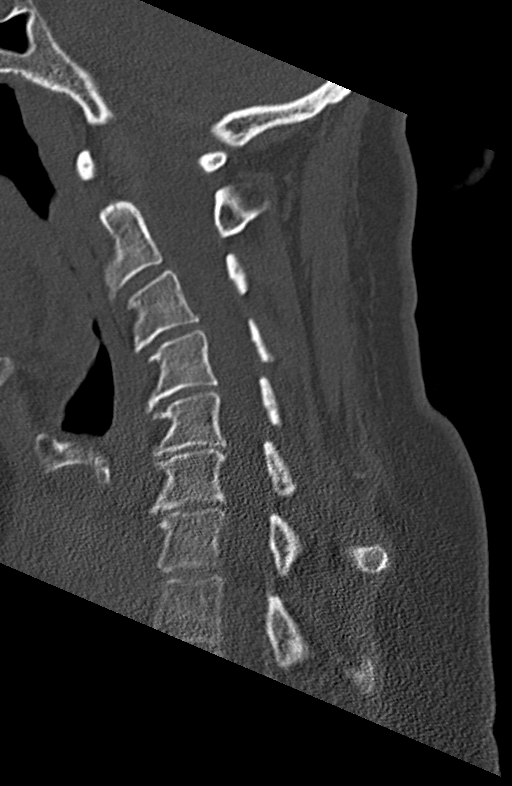
[im 31/61  soft-tissue]
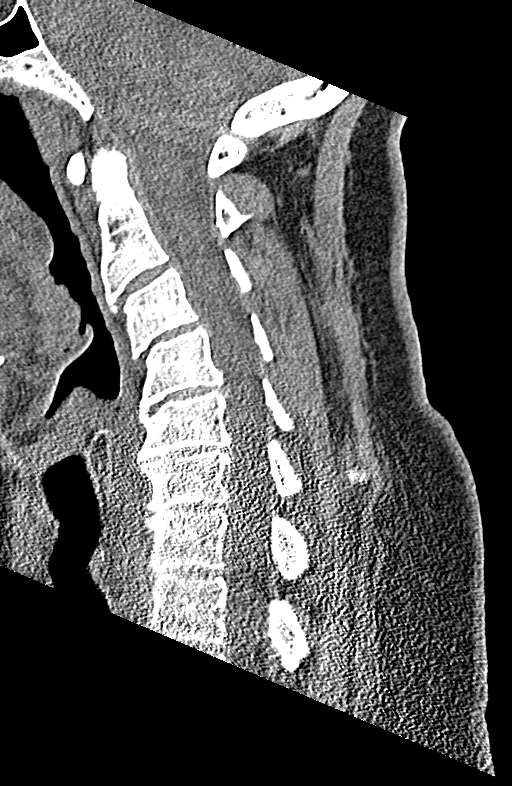
[im 31/61  bone]
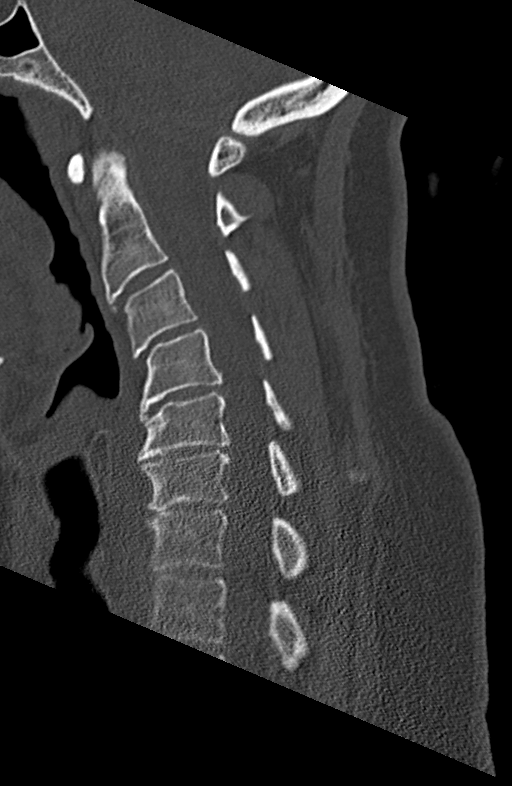
[im 36/61  bone]
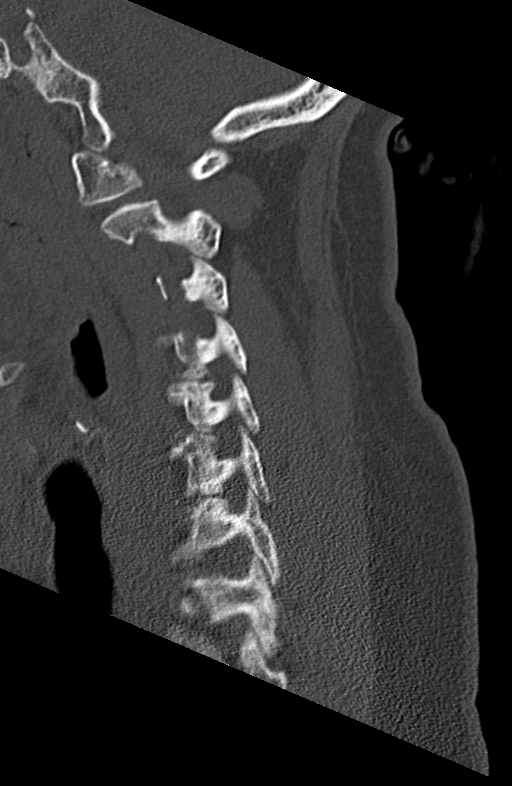
[im 41/61  bone]
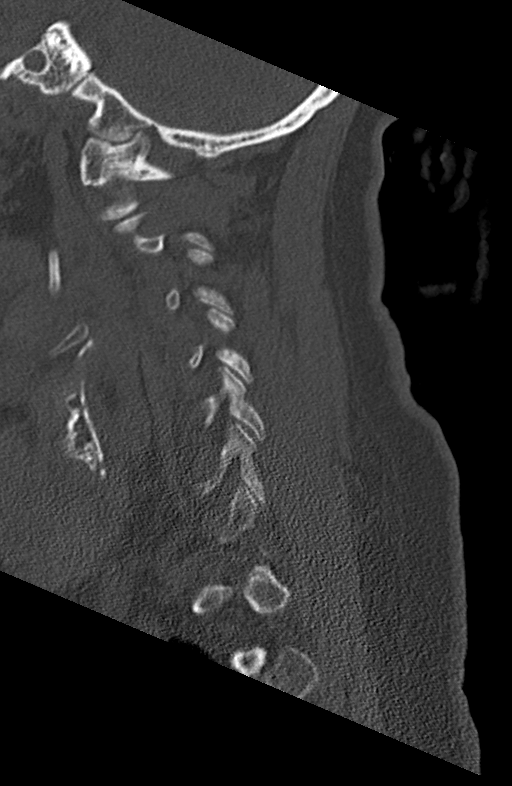

[Series 6: coronal bone · coronal · 0.25mm/px · 3 of 59 slices shown]
[im 12/59  bone]
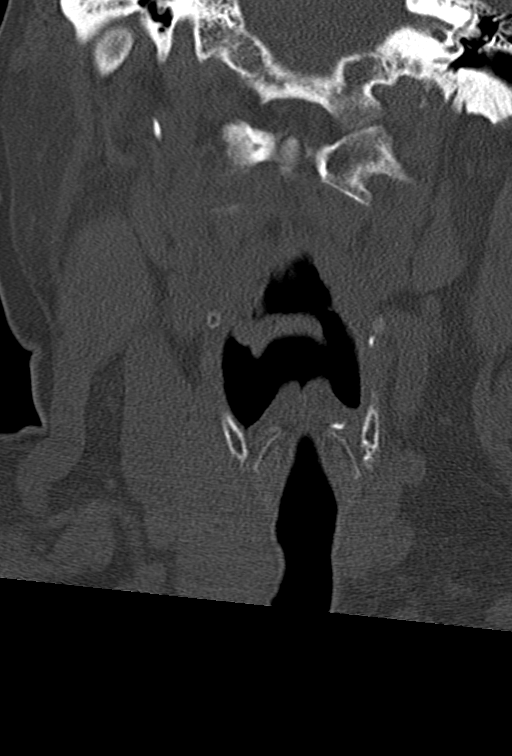
[im 24/59  bone]
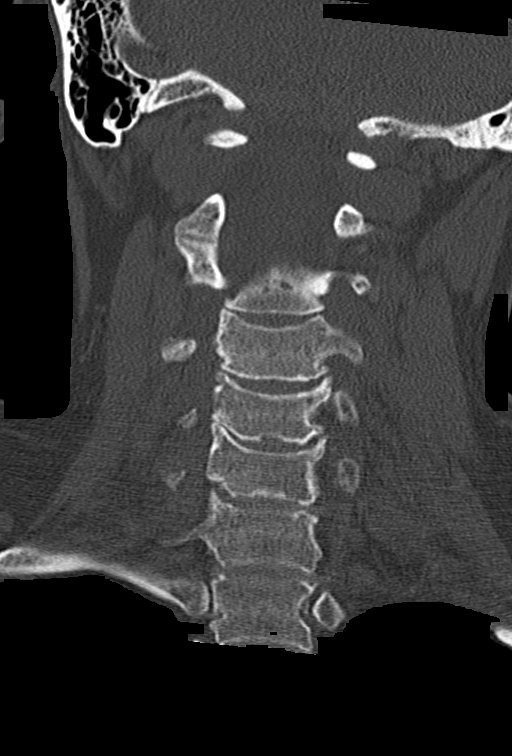
[im 35/59  bone]
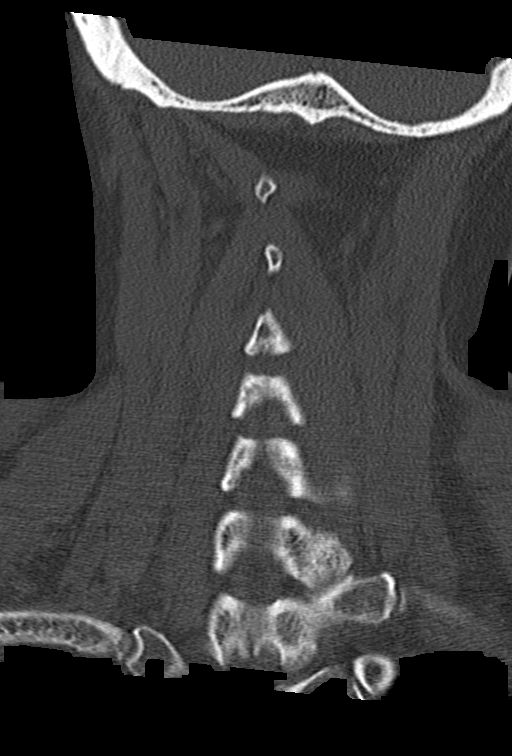

[Series 7: orthogonal axials · axial · 0.21mm/px · z∈[-187,-88]mm · 4 of 105 slices shown, 5 images]
[im 15/105  soft-tissue]
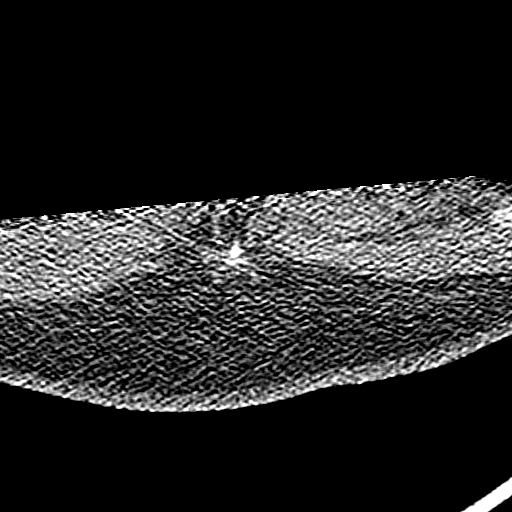
[im 15/105  bone]
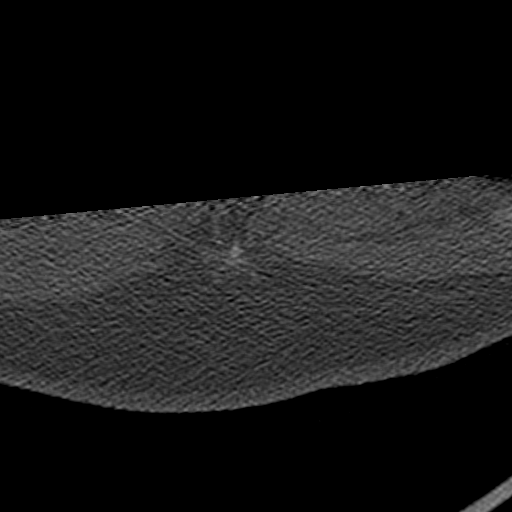
[im 45/105  bone]
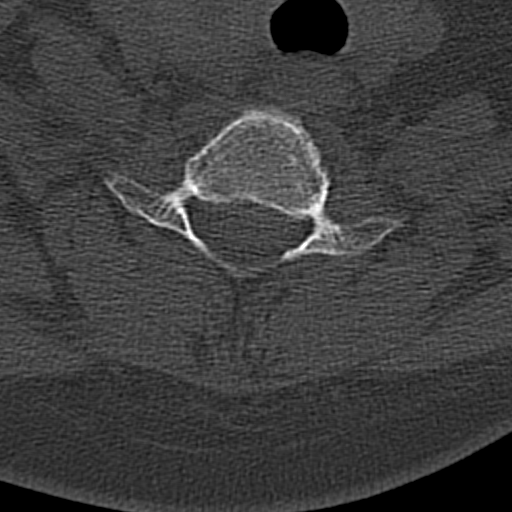
[im 60/105  bone]
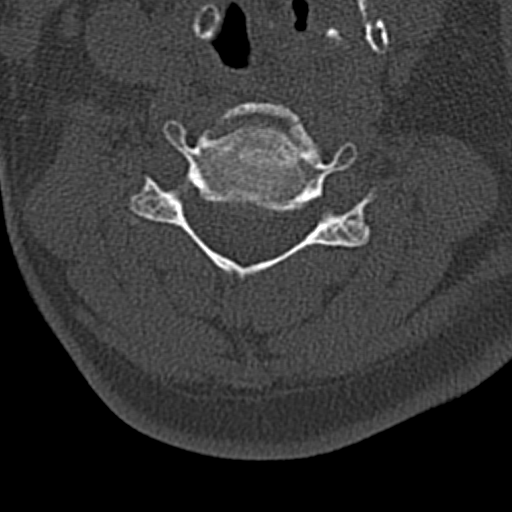
[im 90/105  bone]
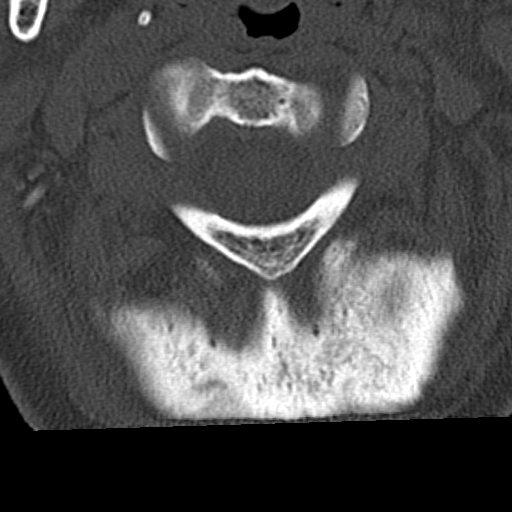

[12 of 33 positions shown; findings below may reference images not displayed]

FINDINGS: CT HEAD FINDINGS

Brain: No evidence of acute infarction, hemorrhage, hydrocephalus,
extra-axial collection or mass lesion/mass effect. Similar slightly
dysmorphic appearance of the superior vermis, likely congenital.

Vascular: Mild calcific atherosclerosis.

Skull: No acute fracture.

Sinuses/Orbits: Sinuses are clear.  Unremarkable orbits.

Other: No mastoid effusions.

CT CERVICAL SPINE FINDINGS

Alignment: There is reversal of the normal cervical lordosis, likely
positional. Slight anterolisthesis of C3 on C4, similar in
appearance to prior MRI of the cervical spine from 03/05/2013.
Slight rotation of C1 on C2.

Skull base and vertebrae: No acute fracture. Vertebral body heights
are maintained. No primary bone lesion or focal pathologic process.

Soft tissues and spinal canal: No prevertebral fluid or swelling. No
visible canal hematoma.

Disc levels: Mild multilevel degenerative change with small disc
bulges at multiple levels. No evidence of high-grade bony canal
stenosis.

Upper chest: Negative.

Other: None.
IMPRESSION: CT head:

No evidence of acute intracranial abnormality.

CT cervical spine:

1. No evidence of acute fracture.
2. Mild rotation of C1 on C2, which is likely positional in the
absence of a fixed torticollis. Otherwise, no evidence of acute
traumatic malalignment.

## 2022-05-26 DIAGNOSIS — Z6828 Body mass index (BMI) 28.0-28.9, adult: Secondary | ICD-10-CM | POA: Diagnosis not present

## 2022-05-26 DIAGNOSIS — K5100 Ulcerative (chronic) pancolitis without complications: Secondary | ICD-10-CM | POA: Diagnosis not present

## 2022-05-26 DIAGNOSIS — Z79899 Other long term (current) drug therapy: Secondary | ICD-10-CM | POA: Diagnosis not present

## 2022-05-26 DIAGNOSIS — R5383 Other fatigue: Secondary | ICD-10-CM | POA: Diagnosis not present

## 2022-05-26 DIAGNOSIS — E663 Overweight: Secondary | ICD-10-CM | POA: Diagnosis not present

## 2022-05-26 DIAGNOSIS — M459 Ankylosing spondylitis of unspecified sites in spine: Secondary | ICD-10-CM | POA: Diagnosis not present

## 2022-05-30 ENCOUNTER — Telehealth: Payer: Self-pay

## 2022-05-30 ENCOUNTER — Encounter: Payer: Self-pay | Admitting: Nurse Practitioner

## 2022-05-30 ENCOUNTER — Ambulatory Visit (INDEPENDENT_AMBULATORY_CARE_PROVIDER_SITE_OTHER): Payer: Medicare HMO | Admitting: Nurse Practitioner

## 2022-05-30 VITALS — BP 122/68 | HR 63 | Temp 98.1°F | Ht 68.0 in | Wt 194.4 lb

## 2022-05-30 DIAGNOSIS — F419 Anxiety disorder, unspecified: Secondary | ICD-10-CM

## 2022-05-30 DIAGNOSIS — Z23 Encounter for immunization: Secondary | ICD-10-CM | POA: Diagnosis not present

## 2022-05-30 DIAGNOSIS — G4700 Insomnia, unspecified: Secondary | ICD-10-CM

## 2022-05-30 DIAGNOSIS — I2510 Atherosclerotic heart disease of native coronary artery without angina pectoris: Secondary | ICD-10-CM | POA: Diagnosis not present

## 2022-05-30 DIAGNOSIS — E559 Vitamin D deficiency, unspecified: Secondary | ICD-10-CM

## 2022-05-30 DIAGNOSIS — E2839 Other primary ovarian failure: Secondary | ICD-10-CM

## 2022-05-30 DIAGNOSIS — I10 Essential (primary) hypertension: Secondary | ICD-10-CM | POA: Diagnosis not present

## 2022-05-30 DIAGNOSIS — Z1231 Encounter for screening mammogram for malignant neoplasm of breast: Secondary | ICD-10-CM | POA: Diagnosis not present

## 2022-05-30 DIAGNOSIS — R69 Illness, unspecified: Secondary | ICD-10-CM | POA: Diagnosis not present

## 2022-05-30 MED ORDER — BUSPIRONE HCL 5 MG PO TABS: 5.0000 mg | ORAL_TABLET | Freq: Every day | ORAL | 1 refills | Status: DC

## 2022-05-30 MED ORDER — ESZOPICLONE 2 MG PO TABS: ORAL_TABLET | ORAL | 5 refills | Status: DC

## 2022-05-30 MED ORDER — OLMESARTAN MEDOXOMIL 20 MG PO TABS: 20.0000 mg | ORAL_TABLET | Freq: Every day | ORAL | 1 refills | Status: DC

## 2022-05-30 MED ORDER — ALBUTEROL SULFATE HFA 108 (90 BASE) MCG/ACT IN AERS: 2.0000 | INHALATION_SPRAY | Freq: Four times a day (QID) | RESPIRATORY_TRACT | 2 refills | Status: DC | PRN

## 2022-05-30 MED ORDER — VITAMIN D (ERGOCALCIFEROL) 1.25 MG (50000 UNIT) PO CAPS: 50000.0000 [IU] | ORAL_CAPSULE | ORAL | 1 refills | Status: DC

## 2022-05-30 MED ORDER — AMITRIPTYLINE HCL 50 MG PO TABS: 50.0000 mg | ORAL_TABLET | Freq: Every day | ORAL | 1 refills | Status: DC

## 2022-05-30 NOTE — Patient Instructions (Signed)

## 2022-05-30 NOTE — Progress Notes (Unsigned)
I,Gabrielle Hawkins,acting as a Education administrator for [employer], FNP.,have documented all relevant documentation on the behalf of Gabrielle Brine, FNP,as directed by  Gabrielle Brine, FNP while in the presence of Gabrielle Hawkins, [place].  Subjective:     Patient ID: Gabrielle Hawkins , female    DOB: October 26, 1965 , 56 y.o.   MRN: [id number]   Chief Complaint  Patient presents with   Hypertension    HPI  She is back on Medicare will be getting [employer]. She is back on her disability. She has seen Dr. Trudie Hawkins on Thursday and is to restart her infusions since her right arm healed (elbow) fracture - had two surgeries.   She is back on Medicare      Past Medical History:  Diagnosis Date   Anemia 1998   Anxiety    Arthritis    big toe    Asthma    BRCA1 negative    BRCA2 negative    Breast cancer ([place]) 2006   chem/radiation/2years tamoxifen   Chronic neck and back pain    Crohn's disease ([place])    Depression    Endometriosis    Fibroid    GERD (gastroesophageal reflux disease)    History of chemotherapy 01/2005   Hx of tamoxifen therapy    Hx of transfusion of packed red blood cells    Hypertension    IBS (irritable bowel syndrome)    Kidney stones    Lymphedema of arm    Migraines    just at dx of breast CA   Mucinous cystadenoma of ovary    left   Neuropathy    Paresthesias    Peripheral neuropathy    Radiation 04/2005   Ulcerative colitis ([place])    with chronic diarrhea   Urinary tract infection    x 3   Vertigo 2014   Vitamin D deficiency      Family History  Problem Relation Age of Onset   Breast cancer Mother 33   Heart failure Father    Heart disease Father    Cancer Maternal Grandmother        COLON   Heart failure Paternal Grandmother    Heart disease Paternal Grandmother      Current Outpatient Medications:    cetirizine (ZYRTEC) 10 MG tablet, Take 10 mg by mouth daily., Disp: , Rfl:    dicyclomine (BENTYL) 20 MG tablet, Take 20 mg by mouth 4  (four) times daily -  before meals and at bedtime. Up to four times daily as needed, Disp: , Rfl:    docusate sodium (COLACE) 100 MG capsule, Take 100 mg by mouth daily., Disp: , Rfl:    folic acid (FOLVITE) 1 MG tablet, Take 3 mg by mouth daily., Disp: , Rfl:    golimumab (SIMPONI ARIA) 50 MG/4ML SOLN injection, 50 mg See admin instructions. Infusion every 8 weeks, [facility], Disp: , Rfl:    HYDROcodone-acetaminophen (NORCO) 5-325 MG tablet, Take 1-2 tablets by mouth every 6 (six) hours as needed for severe pain., Disp: 30 tablet, Rfl: 0   methotrexate (50 MG/ML) 1 g injection, Inject into the vein every Monday. 0.25m every Monday, Disp: , Rfl:    omeprazole (PRILOSEC) 40 MG capsule, Take 1 capsule (40 mg total) by mouth daily., Disp: 30 capsule, Rfl: 3   OVER THE COUNTER MEDICATION, Aleve cream for pain, Disp: , Rfl:    polyethylene glycol (MIRALAX / GLYCOLAX) 17 g packet, Take 17 g by mouth daily as  needed., Disp: , Rfl:    tiZANidine (ZANAFLEX) 2 MG tablet, Take 2 mg by mouth 3 (three) times daily., Disp: , Rfl:    albuterol (VENTOLIN HFA) 108 (90 Base) MCG/ACT inhaler, Inhale 2 puffs into the lungs every 6 (six) hours as needed for wheezing or shortness of breath., Disp: 8 g, Rfl: 2   amitriptyline (ELAVIL) 50 MG tablet, Take 1 tablet (50 mg total) by mouth at bedtime., Disp: 90 tablet, Rfl: 1   busPIRone (BUSPAR) 5 MG tablet, Take 1 tablet (5 mg total) by mouth at bedtime., Disp: 90 tablet, Rfl: 1   eszopiclone (LUNESTA) 2 MG TABS tablet, Take immediately before bedtime, Disp: 30 tablet, Rfl: 5   gabapentin (NEURONTIN) 300 MG capsule, Take 1 capsule (300 mg total) by mouth 2 (two) times daily for 14 days. For 2 weeks post op for pain., Disp: 28 capsule, Rfl: 0   olmesartan (BENICAR) 20 MG tablet, Take 1 tablet (20 mg total) by mouth daily., Disp: 90 tablet, Rfl: 1   Vitamin D, Ergocalciferol, (DRISDOL) 1.25 MG (50000 UNIT) CAPS capsule, Take 1 capsule (50,000 Units total) by mouth  2 (two) times a week. Monday and Wednesday, Disp: 24 capsule, Rfl: 1   Allergies  Allergen Reactions   Humira [Adalimumab] Anaphylaxis   Ampicillin Hives and Itching   Codeine    Compazine [Prochlorperazine Maleate] Other (See Comments)    "Stroke-like symptoms" Can tolerate Phenergan.   Cymbalta [Duloxetine Hcl] Other (See Comments)    Fainting   Imitrex [Sumatriptan Base] Hives and Nausea And Vomiting   Lyrica [Pregabalin] Other (See Comments)    Fainting   Metoclopramide Hcl Hives   Percocet [Oxycodone-Acetaminophen] Itching   Effexor [Venlafaxine Hydrochloride] Hives and Palpitations   Fentanyl Nausea And Vomiting   Tape Itching and Rash     Review of Systems  Constitutional: Negative.   Respiratory: Negative.  Negative for shortness of breath.   Cardiovascular: Negative.  Negative for chest pain and palpitations.  Gastrointestinal: Negative.   Neurological: Negative.  Negative for headaches.  Psychiatric/Behavioral:  Negative for sleep disturbance.      Today's Vitals   05/30/22 0950  BP: 122/68  Pulse: 63  Temp: 98.1 F (36.7 C)  TempSrc: Oral  Weight: 194 lb 6.4 oz (88.2 kg)  Height: _0  (1.727 m)   Body mass index is 29.56 kg/m.   Objective:  Physical Exam Vitals reviewed.  Constitutional:      General: She is not in acute distress.    Appearance: Normal appearance.  Eyes:     Pupils: Pupils are equal, round, and reactive to light.  Cardiovascular:     Rate and Rhythm: Normal rate and regular rhythm.     Pulses: Normal pulses.     Heart sounds: Normal heart sounds. No murmur heard. Pulmonary:     Effort: Pulmonary effort is normal. No respiratory distress.     Breath sounds: Normal breath sounds. No wheezing.  Skin:    General: Skin is warm and dry.     Capillary Refill: Capillary refill takes less than 2 seconds.  Neurological:     General: No focal deficit present.     Mental Status: She is alert and oriented to person, place, and time.      Cranial Nerves: No cranial nerve deficit.     Motor: No weakness.  Psychiatric:        Mood and Affect: Mood normal.        Behavior: Behavior normal.  Thought Content: Thought content normal.        Judgment: Judgment normal.         Assessment And Plan:     1. Essential hypertension Comments: Blood pressure is controlled, continue current medications - olmesartan (BENICAR) 20 MG tablet; Take 1 tablet (20 mg total) by mouth daily.  Dispense: 90 tablet; Refill: 1  2. Atherosclerotic cardiovascular disease Comments: Diet controlled, continue current medications.  - Lipid panel  3. Vitamin D deficiency Will check vitamin D level and supplement as needed.    Also encouraged to spend 15 minutes in the sun daily.  - VITAMIN D 25 Hydroxy (Vit-D Deficiency, Fractures) - Vitamin D, Ergocalciferol, (DRISDOL) 1.25 MG (50000 UNIT) CAPS capsule; Take 1 capsule (50,000 Units total) by mouth 2 (two) times a week. Monday and Wednesday  Dispense: 24 capsule; Refill: 1  4. Anxiety Comments: She is under an increased amount of stress and having more problems sleeping. Will refer for counseling - busPIRone (BUSPAR) 5 MG tablet; Take 1 tablet (5 mg total) by mouth at bedtime.  Dispense: 90 tablet; Refill: 1  5. Insomnia, unspecified type Comments: doing well with Lunesta - eszopiclone (LUNESTA) 2 MG TABS tablet; Take immediately before bedtime  Dispense: 30 tablet; Refill: 5  6. Decreased estrogen level - DG Bone Density; Future  7. Encounter for screening mammogram for breast cancer Pt instructed on Self Breast Exam.According to ACOG guidelines Women aged 88 and older are recommended to get an annual mammogram. Form completed and given to patient contact the [facility] for appointment scheduing.  Pt encouraged to get annual mammogram - MM Digital Screening; Future  8. Encounter for immunization Influenza vaccine administered Encouraged to take Tylenol as needed for fever or  muscle aches. - Flu Vaccine QUAD 6+ mos PF IM (Fluarix Quad PF)     Patient was given opportunity to ask questions. Patient verbalized understanding of the plan and was able to repeat key elements of the plan. All questions were answered to their satisfaction.  Gabrielle Brine, FNP   I, Gabrielle Brine, FNP, have reviewed all documentation for this visit. The documentation on 05/30/22 for the exam, diagnosis, procedures, and orders are all accurate and complete.   IF YOU HAVE BEEN REFERRED TO A SPECIALIST, IT MAY TAKE 1-2 WEEKS TO SCHEDULE/PROCESS THE REFERRAL. IF YOU HAVE NOT HEARD FROM US/SPECIALIST IN TWO WEEKS, PLEASE GIVE [place] A CALL AT [phone].   THE PATIENT IS ENCOURAGED TO PRACTICE SOCIAL DISTANCING DUE TO THE COVID-19 PANDEMIC.

## 2022-05-30 NOTE — Telephone Encounter (Signed)
At checkout patient stated she will give the office a call back to sch [facility] in dec.

## 2022-05-31 LAB — LIPID PANEL
Chol/HDL Ratio: 3.1 ratio (ref 0.0–4.4)
Cholesterol, Total: 187 mg/dL (ref 100–199)
HDL: 60 mg/dL (ref >39)
LDL Chol Calc (NIH): 113 mg/dL — ABNORMAL HIGH (ref 0–99)
Triglycerides: 73 mg/dL (ref 0–149)
VLDL Cholesterol Cal: 14 mg/dL (ref 5–40)

## 2022-05-31 LAB — VITAMIN D 25 HYDROXY (VIT D DEFICIENCY, FRACTURES): Vit D, 25-Hydroxy: 21 ng/mL — ABNORMAL LOW (ref 30.0–100.0)

## 2022-06-03 ENCOUNTER — Encounter: Payer: Self-pay | Admitting: Nurse Practitioner

## 2022-07-01 ENCOUNTER — Ambulatory Visit (INDEPENDENT_AMBULATORY_CARE_PROVIDER_SITE_OTHER): Payer: Medicare Other

## 2022-07-01 DIAGNOSIS — I10 Essential (primary) hypertension: Secondary | ICD-10-CM

## 2022-07-01 DIAGNOSIS — I2510 Atherosclerotic heart disease of native coronary artery without angina pectoris: Secondary | ICD-10-CM

## 2022-07-01 NOTE — Progress Notes (Signed)
Chronic Care Management Pharmacy Note  07/05/2022 Name:  SARON VANORMAN MRN:  [id number] DOB:  12-09-1965  Summary: Remigio Eisenmenger reports that she is taking her medication daily.   Recommendations/Changes made from today's visit: Recommend patient be started on Atorvastatin 10 mg tablet once per day due to diagnosis of Atherosclerosis of Aorta  Recommend patient bring in her new insurance card with [facility], her new insurance card to be brought in to the doctors office Recommend patient be scheduled for OV with PCP team and be scheduled for AWV  Recommend patient receive COVID-19 booster  Plan: Patient to bring insurance card to be scanned into the system  Patient to be started on Atorvastatin  Collaborate with PCP team to schedule a follow up visit with PCP from September  Ms. Meroney would like a referral for an allergist in [place] Dr. Posey Pronto    Subjective: LEYANI GARGUS is an 56 y.o. year old female who is a primary patient of [name], [place].  The CCM team was consulted for assistance with disease management and care coordination needs.    Engaged with patient by telephone for follow up visit in response to provider referral for pharmacy case management and/or care coordination services.   Consent to Services:  The patient was given information about Chronic Care Management services, agreed to services, and gave verbal consent prior to initiation of services.  Please see initial visit note for detailed documentation.   Patient Care Team: [name], FNP as PCP - General (General Practice) Janina Mayo, MD as PCP - Cardiology (Cardiology) Gavin Pound, MD as Consulting Physician (Rheumatology) Lynne Logan, RN as [place] Management  Recent office visits: 05/30/2022 PCP OV   Recent consult visits: 05/26/2022 Specialist [facility] visits: None in previous 6 months   Objective:  Lab Results  Component Value Date    CREATININE 0.81 09/21/2021   BUN 13 09/21/2021   GFR 102.57 02/14/2013   EGFR 82 11/17/2020   GFRNONAA >60 09/21/2021   GFRAA 86 02/18/2020   NA 136 09/21/2021   K 4.0 09/21/2021   CALCIUM 8.8 (L) 09/21/2021   CO2 26 09/21/2021   GLUCOSE 87 09/21/2021    Lab Results  Component Value Date/Time   GFR 102.57 02/14/2013 02:49 PM   MICROALBUR 10 06/04/2019 12:03 PM    Last diabetic Eye exam: No results found for: "HMDIABEYEEXA"  Last diabetic Foot exam: No results found for: "HMDIABFOOTEX"   Lab Results  Component Value Date   CHOL 187 05/30/2022   HDL 60 05/30/2022   LDLCALC 113 (H) 05/30/2022   TRIG 73 05/30/2022   CHOLHDL 3.1 05/30/2022       Latest Ref Rng & Units 01/14/2021    1:05 PM 06/27/2020   12:44 PM 10/06/2018   10:25 PM  Hepatic Function  Total Protein 6.5 - 8.1 g/dL 8.0  7.6  6.9   Albumin 3.5 - 5.0 g/dL 3.8  3.9  3.7   AST 15 - 41 U/L _0 ALT 0 - 44 U/L _1 Alk Phosphatase 38 - 126 U/L 88  81  73   Total Bilirubin 0.3 - 1.2 mg/dL 0.6  0.7  0.5     Lab Results  Component Value Date/Time   TSH 1.640 11/17/2020 03:25 PM   TSH 2.160 03/22/2013 01:42 AM   FREET4 1.12 07/08/2011 12:04 PM  Latest Ref Rng & Units 09/21/2021    3:16 PM 01/14/2021    1:05 PM 06/27/2020   12:44 PM  CBC  WBC 4.0 - 10.5 K/uL 5.2  6.6  7.3   Hemoglobin 12.0 - 15.0 g/dL 13.7  12.7  13.2   Hematocrit 36.0 - 46.0 % 42.7  40.7  41.9   Platelets 150 - 400 K/uL 185  209  204     Lab Results  Component Value Date/Time   VD25OH 21.0 (L) 05/30/2022 10:59 AM   VD25OH 15.0 (L) 11/17/2020 03:25 PM    Clinical ASCVD: No  The 10-year ASCVD risk score (Arnett DK, et al., 2019) is: 3.3%   Values used to calculate the score:     Age: 56 years     Sex: Female     Is Non-Hispanic African American: Yes     Diabetic: No     Tobacco smoker: No     Systolic Blood Pressure: 086 mmHg     Is BP treated: Yes     HDL Cholesterol: 60 mg/dL     Total Cholesterol: 187  mg/dL       05/30/2022    9:50 AM 09/27/2021   11:45 AM 04/21/2021    3:13 PM  Depression screen PHQ 2/9  Decreased Interest 0 1 0  Down, Depressed, Hopeless 0 3 0  PHQ - 2 Score 0 4 0  Altered sleeping  3   Tired, decreased energy  3   Change in appetite  1   Feeling bad or failure about yourself   0   Trouble concentrating  1   Moving slowly or fidgety/restless  3   Suicidal thoughts  0   PHQ-9 Score  15   Difficult doing work/chores  Somewhat difficult      Social History   Tobacco Use  Smoking Status Never  Smokeless Tobacco Never   BP Readings from Last 3 Encounters:  05/30/22 122/68  01/13/22 (!) 143/94  01/05/22 134/88   Pulse Readings from Last 3 Encounters:  05/30/22 63  01/13/22 78  01/05/22 86   Wt Readings from Last 3 Encounters:  05/30/22 194 lb 6.4 oz (88.2 kg)  01/06/22 195 lb (88.5 kg)  01/05/22 195 lb 12.8 oz (88.8 kg)   BMI Readings from Last 3 Encounters:  05/30/22 29.56 kg/m  01/06/22 29.22 kg/m  01/05/22 29.34 kg/m    Assessment/Interventions: Review of patient past medical history, allergies, medications, health status, including review of consultants reports, laboratory and other test data, was performed as part of comprehensive evaluation and provision of chronic care management services.   SDOH:  (Social Determinants of Health) assessments and interventions performed: No SDOH Interventions    Flowsheet Row Office Visit from 09/27/2021 in [place] Visit from 04/21/2021 in [place] Management from 02/09/2021 in [place] Management from 05/01/2020 in [place] Management from 04/02/2020 in [place] Management from 03/18/2020 in [facility]  SDOH Interventions        Food Insecurity Interventions -- -- Intervention Not Indicated -- --  Intervention Not Indicated  Housing Interventions -- -- Intervention Not Indicated -- -- Intervention Not Indicated  Transportation Interventions -- -- Intervention Not Indicated -- -- Intervention Not Indicated  Depression Interventions/Treatment  Medication PHQ2-9 Score <4 Follow-up Not Indicated -- -- -- --  Financial Strain Interventions -- -- -- Other (Comment)  [  Will assist with patient assistance for Remicade if needed] Other (Comment)  [Will help patient apply for Remicade patient assistance program through [place] and [place]: No Food Insecurity (02/09/2021)  Housing: Low Risk  (02/09/2021)  Transportation Needs: No Transportation Needs (02/09/2021)  Depression (PHQ2-9): Low Risk  (05/30/2022)  Financial Resource Strain: Medium Risk (05/05/2020)  Physical Activity: Sufficiently Active (06/04/2019)  Stress: No Stress Concern Present (06/04/2019)  Tobacco Use: Low Risk  (05/30/2022)    CCM Care Plan  Allergies  Allergen Reactions   Humira [Adalimumab] Anaphylaxis   Ampicillin Hives and Itching   Codeine    Compazine [Prochlorperazine Maleate] Other (See Comments)    "Stroke-like symptoms" Can tolerate Phenergan.   Cymbalta [Duloxetine Hcl] Other (See Comments)    Fainting   Imitrex [Sumatriptan Base] Hives and Nausea And Vomiting   Lyrica [Pregabalin] Other (See Comments)    Fainting   Metoclopramide Hcl Hives   Percocet [Oxycodone-Acetaminophen] Itching   Effexor [Venlafaxine Hydrochloride] Hives and Palpitations   Fentanyl Nausea And Vomiting   Tape Itching and Rash    Medications Reviewed Today     Reviewed by [name], FNP (Family Nurse Practitioner) on 05/30/22 at 91  Med List Status: <None>   Medication Order Taking? Sig Documenting Provider Last Dose Status Informant  albuterol (VENTOLIN HFA) 108 (90 Base) MCG/ACT inhaler [id number] Yes Inhale 2 puffs into the lungs every 6 (six) hours as needed for wheezing or  shortness of breath. [name], FNP Taking Active Self  amitriptyline (ELAVIL) 50 MG tablet [id number] Yes Take 1 tablet (50 mg total) by mouth at bedtime. [name], FNP Taking Active   busPIRone (BUSPAR) 5 MG tablet [id number] Yes Take 1 tablet (5 mg total) by mouth 2 (two) times daily.  Patient taking differently: Take 5 mg by mouth at bedtime.   [name], FNP Taking Active   cetirizine (ZYRTEC) 10 MG tablet [id number] Yes Take 10 mg by mouth daily. [provider] Taking Active Self  dicyclomine (BENTYL) 20 MG tablet [id number] Yes Take 20 mg by mouth 4 (four) times daily -  before meals and at bedtime. Up to four times daily as needed [provider] Taking Active Self           Med Note (LITTLE, ANGEL L   Thu Jul 09, 2020 11:43 AM) Patient is taking 1-2 times daily   docusate sodium (COLACE) 100 MG capsule [id number] Yes Take 100 mg by mouth daily. [provider] Taking Active   eszopiclone (LUNESTA) 2 MG TABS tablet [id number] Yes TAKE ONE TABLET BY MOUTH AT bedtime AS NEEDED FOR SLEEP. Take immediately before bedtime. [name], FNP Taking Active   folic acid (FOLVITE) 1 MG tablet [id number] Yes Take 3 mg by mouth daily. [provider] Taking Active Self  gabapentin (NEURONTIN) 300 MG capsule [id number]  Take 1 capsule (300 mg total) by mouth 2 (two) times daily for 14 days. For 2 weeks post op for pain. Ethelda Chick, PA-C  Expired 01/27/22 2359   golimumab (SIMPONI ARIA) 50 MG/4ML SOLN injection [id number] Yes 50 mg See admin instructions. Infusion every 8 weeks, [place] [facility] [provider] Taking Active Self  HYDROcodone-acetaminophen (NORCO) 5-325 MG tablet [id number] Yes Take 1-2 tablets by mouth every 6 (six) hours as needed for severe pain. Ethelda Chick, [place] Taking Active    Patient not taking:   Discontinued 11/02/20 [phone]  Med Note (LITTLE, ANGEL L   Wed Feb 26, 2020 10:00 AM)    methotrexate (50  MG/ML) 1 g injection [id number] Yes Inject into the vein every Monday. 0.2m every Monday [provider] Taking Active Self  olmesartan (BENICAR) 20 MG tablet [id number] Take 1 tablet (20 mg total) by mouth daily. MMinette Brine FNP Taking Active Self  omeprazole (PRILOSEC) 40 MG capsule [id number] Take 1 capsule (40 mg total) by mouth daily. NRozetta Nunnery MD Taking Active Self  OVER THE COUNTER MEDICATION [id number] Aleve cream for pain [provider] Taking Active Self  polyethylene glycol (MIRALAX / GLYCOLAX) 17 g packet [id number] Take 17 g by mouth daily as needed. [provider] Taking Active    Patient not taking:   Discontinued 11/02/20 0709   tiZANidine (ZANAFLEX) 2 MG tablet [id number] Take 2 mg by mouth 3 (three) times daily. [provider] Taking Active Self           Med Note (LITTLE, ASimeon CraftNov 4, 2021 11:44 AM) Patient is taking once daily at bedtime   Vitamin D, Ergocalciferol, (DRISDOL) 1.25 MG (50000 UNIT) CAPS capsule [id number] Take 1 capsule (50,000 Units total) by mouth 2 (two) times a week.  Patient taking differently: Take 50,000 Units by mouth 2 (two) times a week. Monday and Wednesday   MMinette Brine FNP Taking Active Self            Patient Active Problem List   Diagnosis Date Noted   Right-sided low back pain with right-sided sciatica 05/06/2019   Right sided numbness 03/27/2019   Paresthesia 12/25/2018   Essential hypertension 06/26/2018   Abdominal pain, chronic, right lower quadrant 01/06/2015   Nausea and vomiting 01/05/2015   Ulcerative colitis ([facility] 01/05/2015   Enteritis    Nausea with vomiting    Intractable nausea and vomiting 04/29/2014   Benign paroxysmal positional vertigo 03/14/2013   Lymphedema of arm 12/30/2011    Immunization History  Administered Date(s) Administered   Influenza,inj,Quad PF,6+ Mos 06/04/2019, 05/30/2022   Influenza-Unspecified 08/09/2018,  06/08/2020   Moderna Sars-Covid-2 Vaccination 11/13/2019, 12/11/2019, 06/08/2020   Tdap 05/01/2021    Conditions to be addressed/monitored:  Hypertension and Coronary Artery Disease  Care Plan : [place] Updates made by PMayford Knife RSt. [place] 07/05/2022 12:00 AM     Problem: Hypertension, Atherosclerosis of Aorta      Goal: Disease Management   Note:     Current Barriers:  Unable to independently afford treatment regimen Unable to independently monitor therapeutic efficacy  Pharmacist Clinical Goal(s):  Patient will achieve adherence to monitoring guidelines and medication adherence to achieve therapeutic efficacy through collaboration with PharmD and provider.   Interventions: 1:1 collaboration with MMinette Brine FNP regarding development and update of comprehensive plan of care as evidenced by provider attestation and co-signature Inter-disciplinary care team collaboration (see longitudinal plan of care) Comprehensive medication review performed; medication list updated in electronic medical record  Hypertension (BP goal <140/90) -Controlled -Current treatment: Olmesartan 20 mg tablet once per day Appropriate, Effective, Safe, Accessible -Current home readings: she is checking her BP at home, and they are good - 10/27 - 116/68  -Denies hypotensive/hypertensive symptoms -Educated on BP goals and benefits of medications for prevention of heart attack, stroke and kidney damage; Daily salt intake goal < 2300 mg; -Counseled to monitor BP at home at least once per week, document, and provide log at future appointments -  Recommended to continue current medication  Atherosclerosis of Aorta: (LDL goal < 55) -Uncontrolled -Current treatment: Not currently on medication regimen  -Medications previously tried: none noted   -Current dietary patterns: will discuss further during next office visit  -Current exercise habits: will discuss further  -Educated on  Importance of limiting foods high in cholesterol; Exercise goal of 150 minutes per week; Strategies to manage statin-induced myalgias; -Patient reports that she is open to starting medication for her cholesterol  -Collaborated with PCP to start patient on Atorvastatin 10 mg tablet once per day. Patient is aware of this recommendation and agreed.  She will need follow up labs to be completed in 8 weeks including a lipid panel and chemistry   Vitamin D Deficiency (Goal: with in normal limits) -Uncontrolled -Current treatment  Ergocalciferol 50,000 units twice per week  Appropriate, Effective, Safe, Accessible -Recommended to continue current medication  Patient Goals/Self-Care Activities Patient will:  - take medications as prescribed as evidenced by patient report and record review  Follow Up Plan: The patient has been provided with contact information for the care management team and has been advised to call with any health related questions or concerns.       Medication Assistance: None required.  Patient affirms current coverage meets needs.  Compliance/Adherence/Medication fill history: Care Gaps: [employer] Medicare AWV COVID-19 Booster Mammogram  Star-Rating Drugs: Olmesartan 20 mg tablet   Patient's preferred pharmacy is:  [facility] - [place], [place] - [address] [place] [address] Phone: [phone] Fax: [phone]  [facility] [address] [place] [address] Phone: [phone] Fax: [phone]  Uses pill box? Yes Pt endorses 85% compliance  We discussed: Benefits of medication synchronization, packaging and delivery as well as enhanced pharmacist oversight with [facility]. Patient decided to: Continue current medication management strategy  Care Plan and Follow Up Patient Decision:  Patient agrees to Care Plan and Follow-up.  Plan: The  patient has been provided with contact information for the care management team and has been advised to call with any health related questions or concerns.   Orlando Penner, CPP, PharmD Clinical Pharmacist Practitioner [facility] [phone]

## 2022-07-05 DIAGNOSIS — I10 Essential (primary) hypertension: Secondary | ICD-10-CM | POA: Diagnosis not present

## 2022-07-05 DIAGNOSIS — I2510 Atherosclerotic heart disease of native coronary artery without angina pectoris: Secondary | ICD-10-CM | POA: Diagnosis not present

## 2022-07-05 MED ORDER — ATORVASTATIN CALCIUM 10 MG PO TABS: 10.0000 mg | ORAL_TABLET | Freq: Every day | ORAL | 1 refills | Status: DC

## 2022-07-05 NOTE — Patient Instructions (Signed)
Visit Information It was great speaking with you today!  Please let me know if you have any questions about our visit.   Goals Addressed   None     Patient Care Plan: Social Work Care Plan  Completed 06/14/2021   Problem Identified: Care Coordination Resolved 06/14/2021     Long-Range Goal: Collaborate with RN Care Manager to perform appropriate assessments to assist with care coordination needs Completed 06/14/2021  Start Date: 08/20/2020  Expected End Date: 06/09/2021  Recent Progress: On track  Priority: Low  Note:   Current Barriers:  Financial constraints related to recent loss of Disability benefit Chronic conditions including HTN and Crohn's Disease which put patient at increased risk of hospitalization  Social Work Clinical Goal(s):   patient will work with SW to address concerns related to care coordination  Interventions: 1:1 collaboration with [name], [place] regarding development and update of comprehensive plan of care as evidenced by provider attestation and co-signature Inter-disciplinary care team collaboration (see longitudinal plan of care) Successful outbound call placed to the patient to assess care coordination needs Discussed the patient has been offered a job with [employer] in [place] and will start 10.11.22 Congratulated the patient on her new job opportunity - patient will continue the ability to afford costs of living which was previously a major concern Discussed the patient will work daily from 8-5:30 pm  Encouraged the patient to contact SW as needed regarding resource needs Collaboration with Smitty Knudsen advising of SW plan to sign off with option to re-open as needed  Patient Goals/Self-Care Activities  patient will:   - Patient will call provider office for new concerns or questions Contact SW as needed       Patient Care Plan: Ulcerative Colitis  Completed 07/14/2021   Problem Identified: Symptoms (Ulcerative  Colitis) Resolved 07/13/2021  Priority: Medium  Note:   Resolving due to duplicate goal    Long-Range Goal: Symptoms Managed-Ulcerative Colitis Completed 07/13/2021  Start Date: 11/19/2020  Expected End Date: 11/19/2021  Recent Progress: On track  Priority: Medium  Note:   Current Barriers:  Ineffective Self Health Maintenance Clinical Goal(s):  Collaboration with [name], FNP regarding development and update of comprehensive plan of care as evidenced by provider attestation and co-signature Inter-disciplinary care team collaboration (see longitudinal plan of care) patient will work with care management team to address care coordination and chronic disease management needs related to Disease Management Educational Needs Care Coordination Medication Management and Education Psychosocial Support   Interventions:  04/07/21 completed successful outbound call with patient  Evaluation of current treatment plan related to Ulcerative Colitis and ankylosing spondylitis, self-management and patient's adherence to plan as established by provider. Collaboration with [name], FNP regarding development and update of comprehensive plan of care as evidenced by provider attestation       and co-signature Inter-disciplinary care team collaboration (see longitudinal plan of care) Review of patient status, including review of consultant's reports, relevant laboratory and other test results, and medications completed. Reviewed medications with patient and discussed importance of medication adherence Reviewed and discussed recent Assessment/Plan following MD follow up with Dr. Lenna Gilford on 03/24/21 with the following noted patient has started Simponi Aria, every 3 months for UC and ankylosing spondylitis Determined patient's symptoms are improving however, she is experiencing wearing off effects right before her next infusion, patient states Dr. Lenna Gilford is aware and may increase dosage or frequency   Educated on potentials triggers for exacerbation and when to  call her doctor  Encouraged patient to balance her activity with rest, getting at least 8 hours of sleep per night, eating a well balanced diet and increasing her water intake to 64 oz daily  Discussed plans with patient for ongoing care management follow up and provided patient with direct contact information for care management team Self Care Activities:  begin a symptom diary bring symptom diary to all appointments track what makes symptoms worse and what makes them better Keep all scheduled MD appointments Contact PCP for questions or concerns Call the pharmacy to refill your medications at least 7 days before you run out of medication  Patient Goals: - Manage my symptoms related to Crohn's disease and Ankylosing Spondylitis   Follow Up Plan: Telephone follow up appointment with care management team member scheduled for: 07/08/21  Resolving due to duplicate goal     Patient Care Plan: High Risk for Infection (Recurrent UTI)  Completed 04/14/2021   Problem Identified: Recurrent UTI Resolved 04/07/2021  Priority: High     Long-Range Goal: Recurrent UTI minimized or prevented Completed 04/07/2021  Start Date: 11/19/2020  Expected End Date: 05/21/2021  Recent Progress: On track  Priority: High  Note:   Current Barriers:  Ineffective Self Health Maintenance Clinical Goal(s):  Collaboration with [name], FNP regarding development and update of comprehensive plan of care as evidenced by provider attestation and co-signature Inter-disciplinary care team collaboration (see longitudinal plan of care) patient will work with care management team to address care coordination and chronic disease management needs related to Disease Management Educational Needs Care Coordination Medication Management and Education Psychosocial Support   Interventions:  04/07/21 completed successful outbound call with patient  Evaluation of  current treatment plan related to   HTN, Thyroid nodule, Vitamin D deficiency, Ulcerative Colitis, fatigue, right sided low back pain with sciatica  , self-management and patient's adherence to plan as established by provider. Collaboration with [name], [place] regarding development and update of comprehensive plan of care as evidenced by provider attestation       and co-signature Inter-disciplinary care team collaboration (see longitudinal plan of care) Determined patient has experienced no further UTI's since last diagnosed/treated in October 2021 Educated patient on importance of drinking at least 64 oz of water per day unless otherwise directed Encouraged on perineal hygiene and when to call the doctor if symptoms reoccur, educated on prompt reporting of symptoms will help get early treatment and help prevent ED/IP events  Discussed plans with patient for ongoing care management follow up and provided patient with direct contact information for care management team Self Care Activities:  Drink 64 oz of water daily Call [facility] to schedule a new patient appointment for evaluation and treatment of recurrent UTI Report early symptoms suggestive of UTI promptly in order to seek treatment early  Avoid caffeinated drinks  Patient Goals: - completed follow up with Urology for evaluation and treatment of recurrent UTI  Follow Up Plan: Follow up with provider re: recurrent UTI as directed        Patient Care Plan: Vitamin D deficiency  Completed 07/14/2021   Problem Identified: Vitamin D deficiency Resolved 07/13/2021  Priority: High  Note:   Resolving due to duplicate goal    Long-Range Goal: Vitamin D deficiency improved or resolved Completed 07/13/2021  Start Date: 11/19/2020  Expected End Date: 11/18/2021  Recent Progress: On track  Priority: High  Note:   Current Barriers:  Ineffective Self Health Maintenance Clinical Goal(s):  Collaboration with Laurance Flatten,  Doreene Burke, [place]  regarding development and update of comprehensive plan of care as evidenced by provider attestation and co-signature Inter-disciplinary care team collaboration (see longitudinal plan of care) patient will work with care management team to address care coordination and chronic disease management needs related to Disease Management Educational Needs Care Coordination Medication Management and Education Psychosocial Support   Interventions:  04/07/21 completed successful outbound call with patient  Evaluation of current treatment plan related to  HTN, Thyroid nodule, Vitamin D deficiency, Ulcerative Colitis, fatigue, right sided low back pain with sciatica , self-management and patient's adherence to plan as established by provider. Collaboration with [name], FNP regarding development and update of comprehensive plan of care as evidenced by provider attestation       and co-signature Inter-disciplinary care team collaboration (see longitudinal plan of care) Provided education to patient about basic disease process related to Vitamin D deficiency  Review of patient status, including review of consultant's reports, relevant laboratory and other test results, and medications completed. Reviewed medications with patient and discussed importance of medication adherence; Determined patient has not been taking the Vitamin D supplement as prescribed  Reviewed and informed patient of PCP recommendations for treatment of Vitamin D deficiency and instructed patient on dosing and frequency Re-educated on the importance to eat a Vitamin D rich diet, get at least 15 minutes of natural sunlight when possible, take Vitamin D supplement as directed  Discussed plans with patient for ongoing care management follow up and provided patient with direct contact information for care management team Self Care Activities:  Keep all scheduled MD appointments Contact PCP for questions or concerns Call the pharmacy to  refill your medications at least 7 days before you run out of medication  Start Vitamin D 3 and take exactly as directed w/o missed doses Get at least 15 minutes of natural sunlight when able Eat Vitamin D rich foods  Patient Goals: - to improve and or resolve Vitamin D deficiency and have less fatigue   Follow Up Plan: Telephone follow up appointment with care management team member scheduled for: 07/08/21  Resolving due to duplicate goal     Patient Care Plan: Evaluation of Thyroid nodule  Completed 04/14/2021   Problem Identified: Evaluate and Treat abnormal thyroid nodule Resolved 04/07/2021  Priority: High     Long-Range Goal: Evaluate and treat thyroid nodule Completed 04/07/2021  Start Date: 11/23/2020  Expected End Date: 05/21/2021  Recent Progress: On track  Priority: High  Note:   Current Barriers:  Ineffective Self Health Maintenance Clinical Goal(s):  Collaboration with [name], FNP regarding development and update of comprehensive plan of care as evidenced by provider attestation and co-signature Inter-disciplinary care team collaboration (see longitudinal plan of care) patient will work with care management team to address care coordination and chronic disease management needs related to Disease Management Educational Needs Care Coordination Medication Management and Education Psychosocial Support   Interventions:  04/07/21 completed successful outbound call with patient  Evaluation of current treatment plan related to  HTN, Thyroid nodule, Vitamin D deficiency, Ulcerative Colitis, fatigue, right sided low back pain with sciatica , self-management and patient's adherence to plan as established by provider. Collaboration with [name], FNP regarding development and update of comprehensive plan of care as evidenced by provider attestation       and co-signature Inter-disciplinary care team collaboration (see longitudinal plan of care) Determined patient  underwent a fine needle aspiration biopsy of a small thyroid nodule with results proving to be  benign and no further follow up was recommended unless this nodule enlarges Discussed plans with patient for ongoing care management follow up and provided patient with direct contact information for care management team Self Care Activities:  Complete thyroid US as recommended and as scheduled  Patient Goals: - to have a thyroid ultrasound for evaluation of thyroid nodule   Follow Up Plan: Follow up with provider re: as directed        Patient Care Plan: [place] of Care  Completed 05/04/2022   Problem Identified: No plan established for management of chronic disease states (Ulcerative Colitis, Vitamin D deficiency) Resolved 05/04/2022  Priority: High     Long-Range Goal: Development of plan of care for chronic disease management for Ulcerative Colitis, Vitamin D deficiency Completed 05/04/2022  Start Date: 07/13/2021  Expected End Date: 07/14/2023  Recent Progress: On track  Priority: High  Note:   Current Barriers:  Knowledge Deficits related to plan of care for management of Ulcerative Colitis, Vitamin D deficiency Chronic Disease Management support and education needs related to Ulcerative Colitis, Vitamin D deficiency Caregiver Stress   RNCM Clinical Goal(s):  Patient will verbalize basic understanding of  Ulcerative Colitis, Vitamin D deficiency disease process and self health management plan   take all medications exactly as prescribed and will call provider for medication related questions demonstrate Ongoing health management independence   continue to work with RN Care Manager to address care management and care coordination needs related to  Ulcerative Colitis, Vitamin D deficiency will demonstrate ongoing self health care management ability    through collaboration with RN Care manager, provider, and care team.   Interventions: 1:1 collaboration with primary care  provider regarding development and update of comprehensive plan of care as evidenced by provider attestation and co-signature Inter-disciplinary care team collaboration (see longitudinal plan of care) Evaluation of current treatment plan related to  self management and patient's adherence to plan as established by provider  Ulcerative Colitis Interventions: Status: ( Unknown: patient transitioned to care coordination ) Long Term Goal  Evaluation of current treatment plan related to  Ulcerative Colitis , self-management and patient's adherence to plan as established by provider. Discussed plans with patient for ongoing care management follow up and provided patient with direct contact information for care management team Reviewed medications with patient and discussed indication, dosage and frequency of prescribed medications for treatment of UC; educated on potential SE such as increased risk for infection and when to call the doctor if needed; Reviewed scheduled/upcoming provider appointments including; Social Work referral for caregiver resources ; Discussed plans with patient for ongoing care management follow up and provided patient with direct contact information for care management team; Screening for signs and symptoms of depression related to chronic disease state;   Vitamin D deficiency Interventions: Status: (  Unknown: patient transitioned to care coordination  ) Long Term Goal  Evaluation of current treatment plan related to  Vitamin D deficiency , self-management and patient's adherence to plan as established by provider. Review of patient status, including review of consultant's reports, relevant laboratory and other test results, and medications completed. Reviewed medications with patient and discussed indication, dosage and frequency of prescribed Vitamin D and when retesting is recommending; Educated on importance to get at least 15 minutes of natural sunlight when possible, eat  Vitamin D rich foods ; Discussed plans with patient for ongoing care management follow up and provided patient with direct contact information for care management team  LVH (  left ventricular hypertrophy Interventions:  (Status:    Unknown: patient transitioned to care coordination  )  Short Term Goal Evaluation of current treatment plan related to  left ventricular hypertrophy , self-management and patient's adherence to plan as established by provider Discussed and reviewed recent ED visit due to patient was experiencing chest pain and shortness of breath  Review of patient status, including review of consultant's reports, relevant laboratory and other test results, and medications completed Reviewed and discussed recent Cardiology follow up completed with Dr. Harl Bowie on 10/06/21 with the following Assessment/Plan noted:   PLAN:     TTE Follow up 3 month Medication Adjustments/Labs and Tests Ordered: Current medicines are reviewed at length with the patient today.  Concerns regarding medicines are outlined above.     Orders Placed This Encounter  Procedures   EKG 12-Lead   ECHOCARDIOGRAM COMPLETE    Patient Instructions  Medication Instructions:  No Changes In Medications at this time Determined patient verbalizes understanding of her prescribed treatment recommendations  Educated patient on sign/symptoms suggestive of worsening condition and when to seek medical attention or call 911 Reviewed scheduled/upcoming provider appointments including: Echocardiogram scheduled for 10/19/21 _0 :90 AM Discussed plans with patient for ongoing care management follow up and provided patient with direct contact information for care management team   Closed fracture dislocation of right elbow:  (Status:    Unknown: patient transitioned to care coordination  )  Long Term Goal Evaluation of current treatment plan related to  elbow pain secondary to closed fx dislocation of right elbow  , self-management and  patient's adherence to plan as established by provider Determined patient underwent an ANTERIOR INTEROSSEOUS NERVE DECOMPRESSION HARDWARE REMOVAL/ELBOW on 01/13/22 completed by Dr. Griffin Basil Determined patient has recovered from her elbow fracture and surgical repair, she is following HEP as provided by Orthopedics and has been cleared to resume normal activities Discussed plans with patient for ongoing care management follow up and provided patient with direct contact information for care management team   Hypertension Interventions:  (Status:    Unknown: patient transitioned to care coordination  ) Long Term Goal Last practice recorded BP readings:  BP Readings from Last 3 Encounters:  01/13/22 (!) 143/94  01/05/22 134/88  11/15/21 (!) 142/82  Most recent eGFR/CrCl:  Lab Results  Component Value Date   EGFR 82 11/17/2020    No components found for: "CRCL" Evaluation of current treatment plan related to hypertension self management and patient's adherence to plan as established by provider Reviewed medications with patient and discussed importance of compliance Counseled on the importance of exercise goals with target of 150 minutes per week Advised patient, providing education and rationale, to monitor blood pressure daily and record, calling PCP for findings outside established parameters Provided education on prescribed diet low Sodium  Discussed complications of poorly controlled blood pressure such as heart disease, stroke, circulatory complications, vision complications, kidney impairment, sexual dysfunction    Hyperlipidemia Interventions:  (Status:    Unknown: patient transitioned to care coordination  ) Long Term Goal Provider established cholesterol goals reviewed Provided HLD educational materials Reviewed importance of limiting foods high in cholesterol Reviewed exercise goals and target of 150 minutes per week Lipid Panel     Component Value Date/Time   CHOL 204 (H)  11/17/2020 1525   TRIG 80 11/17/2020 1525   HDL 60 11/17/2020 1525   CHOLHDL 3.4 11/17/2020 1525   CHOLHDL 3.5 12/24/2009 0817   VLDL 10 12/24/2009 0817   LDLCALC 130 (  H) 11/17/2020 1525   LABVLDL 14 11/17/2020 1525     Patient Goals/Self-Care Activities: Take all medications as prescribed Attend all scheduled provider appointments Call pharmacy for medication refills 3-7 days in advance of running out of medications Perform all self care activities independently  Call provider office for new concerns or questions  check blood pressure 3 times per week choose a place to take my blood pressure (home, clinic or office, retail store) keep a blood pressure log take blood pressure log to all doctor appointments call doctor for signs and symptoms of high blood pressure keep all doctor appointments take medications for blood pressure exactly as prescribed begin an exercise program adhere to prescribed diet: low trans and saturated fats   Follow Up Plan:  patient transitioned to care coordination       Patient Care Plan: Social Work Plan of Care  Completed 01/19/2022   Problem Identified: Caregiver Stress Resolved 01/19/2022     Long-Range Goal: Caregiver Coping Optimized Completed 01/19/2022  Start Date: 09/24/2021  Recent Progress: On track  Priority: High  Note:   Current Barriers:  Chronic disease management support and education needs related to  HTN, Ulcerative Colitis, Vitamin D Deficiency   Caregiver Stress related to caring for relative with Alzheimer's Disease  Social Worker Clinical Goal(s):  patient will work with SW to identify and address any acute and/or chronic care coordination needs related to the self health management of  HTN, Ulcerative Colitis, Vitamin D Deficiency   patient will work with SW to address concerns related to caregiver stress SW Interventions:  Inter-disciplinary care team collaboration (see longitudinal plan of care) Collaboration with  [name] FNP regarding development and update of comprehensive plan of care as evidenced by provider attestation and co-signature Telephonic visit completed with the patient to assess for care coordination needs Discussed the patient underwent surgery on 5/11 to address ongoing injury to her arm Patient has returned home and has a scheduled post-op visit tomorrow 5/18 Patient reports she is taking diaudid for pain and has experienced som nausea from this medication. Her surgeon has also prescribed zofran as well as a stool softener Encouraged the patient to speak with her surgeon regarding any medical concerns related to surgery Performed chart review to note upcoming appointments with both RN Care Manager and PharmD Advised the patient she is not scheduled to see her primary care provider in the next year Discussed the patient canceled her past appointment due to her arm and plans to call to schedule a follow up visit once her arm is healed Discussed plan for patient to contact his SW as needed with future care coordination needs - patient agreeable to plan  Patient Goals/Self-Care Activities patient will:  -Contact surgeons office as needed regarding medical questions related to her arm -Contact Primary Care Provider as needed to schedule an appointment -Engage with RN Care Manager regarding care management needs  - Contact SW as needed      Patient Care Plan: [place] Plan     Problem Identified: Hypertension, Atherosclerosis of Aorta      Goal: Disease Management   Note:     Current Barriers:  Unable to independently afford treatment regimen Unable to independently monitor therapeutic efficacy  Pharmacist Clinical Goal(s):  Patient will achieve adherence to monitoring guidelines and medication adherence to achieve therapeutic efficacy through collaboration with PharmD and provider.   Interventions: 1:1 collaboration with [name], FNP regarding  development and update of comprehensive plan of  care as evidenced by provider attestation and co-signature Inter-disciplinary care team collaboration (see longitudinal plan of care) Comprehensive medication review performed; medication list updated in electronic medical record  Hypertension (BP goal <140/90) -Controlled -Current treatment: Olmesartan 20 mg tablet once per day Appropriate, Effective, Safe, Accessible -Current home readings: she is checking her BP at home, and they are good - 10/27 - 116/68  -Denies hypotensive/hypertensive symptoms -Educated on BP goals and benefits of medications for prevention of heart attack, stroke and kidney damage; Daily salt intake goal < 2300 mg; -Counseled to monitor BP at home at least once per week, document, and provide log at future appointments -Recommended to continue current medication  Atherosclerosis of Aorta: (LDL goal < 55) -Uncontrolled -Current treatment: Not currently on medication regimen  -Medications previously tried: none noted   -Current dietary patterns: will discuss further during next office visit  -Current exercise habits: will discuss further  -Educated on Importance of limiting foods high in cholesterol; Exercise goal of 150 minutes per week; Strategies to manage statin-induced myalgias; -Patient reports that she is open to starting medication for her cholesterol  -Collaborated with PCP to start patient on Atorvastatin 10 mg tablet once per day. Patient is aware of this recommendation and agreed.  She will need follow up labs to be completed in 8 weeks including a lipid panel and chemistry   Vitamin D Deficiency (Goal: with in normal limits) -Uncontrolled -Current treatment  Ergocalciferol 50,000 units twice per week  Appropriate, Effective, Safe, Accessible -Recommended to continue current medication  Patient Goals/Self-Care Activities Patient will:  - take medications as prescribed as evidenced by patient report  and record review  Follow Up Plan: The patient has been provided with contact information for the care management team and has been advised to call with any health related questions or concerns.       Patient agreed to services and verbal consent obtained.   The patient verbalized understanding of instructions, educational materials, and care plan provided today and agreed to receive a mailed copy of patient instructions, educational materials, and care plan.   Orlando Penner, PharmD Clinical Pharmacist [facility] [phone]

## 2022-07-08 ENCOUNTER — Ambulatory Visit: Payer: Medicare Other | Attending: Internal Medicine | Admitting: Internal Medicine

## 2022-07-08 ENCOUNTER — Ambulatory Visit (INDEPENDENT_AMBULATORY_CARE_PROVIDER_SITE_OTHER): Payer: Medicare Other

## 2022-07-08 ENCOUNTER — Ambulatory Visit: Payer: 59 | Admitting: Internal Medicine

## 2022-07-08 ENCOUNTER — Encounter: Payer: Self-pay | Admitting: Internal Medicine

## 2022-07-08 ENCOUNTER — Other Ambulatory Visit: Payer: Self-pay

## 2022-07-08 VITALS — BP 122/82 | HR 89 | Ht 68.5 in | Wt 199.8 lb

## 2022-07-08 DIAGNOSIS — I10 Essential (primary) hypertension: Secondary | ICD-10-CM

## 2022-07-08 DIAGNOSIS — R002 Palpitations: Secondary | ICD-10-CM

## 2022-07-08 MED ORDER — ATORVASTATIN CALCIUM 20 MG PO TABS: 20.0000 mg | ORAL_TABLET | Freq: Every day | ORAL | 3 refills | Status: DC

## 2022-07-08 MED ORDER — ATORVASTATIN CALCIUM 10 MG PO TABS: 10.0000 mg | ORAL_TABLET | Freq: Every day | ORAL | 1 refills | Status: DC

## 2022-07-08 NOTE — Progress Notes (Unsigned)
Enrolled patient for a 7 day Zio XT monitor to be mailed to patients home.  

## 2022-07-08 NOTE — Progress Notes (Signed)
Cardiology Office Note:    Date:  07/08/2022   ID:  Gabrielle Hawkins, DOB [birth year], MRN [id number]  PCP:  Gabrielle Brine, FNP   [facility] Providers Cardiologist:  Gabrielle Mayo, MD     Referring MD: Gabrielle Brine, FNP   No chief complaint on file. LVH  History of Present Illness:    Gabrielle Hawkins is a 56 y.o. female with a hx below, breast cancer adriamycin, cytoxan, 2006 and radiation/right breast s/p lumpectomy and removal of lymph nodes and she is in remission, IBS, referral for LVH  She states that she went to the ED and having mild chest pains with her arm and shoulder. She feels more pain in her shoulder and her arm. It radiates around the breast. The duration has been 2 weeks. No provocation. Persistent dull and sharp pain.  She takes motrin and it can helps. 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 She has had some dizziness and has fainted. She stood up and went to the bathroom and fainted. She has mild hypertension. She was on prednisone. She had some swelling in the legs when she was working but not anymore. She is primarily taking care of her aunt. She gets winded with exertional activities and she rests when she needs to. She can walk up a flight of stairs ok. She denies PND, orthopnea. She has no hx ot DM2. No smoking history.   Her father has an ICD for CHF, noted significant bradycardia as well.  and her mother has CHF. No known diagnosis of hypertrophic cardiomyopathy. No family hx of sudden cardiac death.   She has hx of breast cancer in 2006 managed here. She was on doxorubicin. No hx of cardiotoxicity. XRT was to the right breast. S/p tamoxifen. She's in remission.   Interim Hx: EF was borderline low. Otherwise was normal.  She fell has had some arm surgeries.  Interim Hx 11/3 She is working on losing weight. Has not started her statin. She notes some recent palpitations.   TTE 10/19/2021 : EF 50-55, nl RV, no valve dx, no  pulmonary HTN CT PE 09/21/2021 - noted CAC  The 10-year ASCVD risk score (Arnett DK, et al., 2019) is: 3.3%   Values used to calculate the score:     Age: 47 years     Sex: Female     Is Non-Hispanic African American: Yes     Diabetic: No     Tobacco smoker: No     Systolic Blood Pressure: 387 mmHg     Is BP treated: Yes     HDL Cholesterol: 60 mg/dL     Total Cholesterol: 187 mg/dL    Past Medical History:  Diagnosis Date   Anemia 1998   Anxiety    Arthritis    big toe    Asthma    BRCA1 negative    BRCA2 negative    Breast cancer ([place]) 2006   chem/radiation/2years tamoxifen   Chronic neck and back pain    Crohn's disease ([place])    Depression    Endometriosis    Fibroid    GERD (gastroesophageal reflux disease)    History of chemotherapy 01/2005   Hx of tamoxifen therapy    Hx of transfusion of packed red blood cells    Hypertension    IBS (irritable bowel syndrome)    Kidney stones    Lymphedema of arm    Migraines    just at dx  of breast CA   Mucinous cystadenoma of ovary    left   Neuropathy    Paresthesias    Peripheral neuropathy    Radiation 04/2005   Ulcerative colitis ([place])    with chronic diarrhea   Urinary tract infection    x 3   Vertigo 2014   Vitamin D deficiency     Past Surgical History:  Procedure Laterality Date   ABDOMINAL HYSTERECTOMY  2007   TAH/BSO   ANTERIOR INTEROSSEOUS NERVE DECOMPRESSION Right 01/13/2022   Procedure: ANTERIOR INTEROSSEOUS NERVE DECOMPRESSION;  Surgeon: Gabrielle Gash, MD;  Location: [place];  Service: Orthopedics;  Laterality: Right;   BREAST SURGERY  2006   LUMPECTOMY   CHOLECYSTECTOMY     CHOLECYSTECTOMY N/A 04/29/2014   Procedure: LAPAROSCOPIC CHOLECYSTECTOMY WITH INTRAOPERATIVE CHOLANGIOGRAM;  Surgeon: Gabrielle Jolly, MD;  Location: [facility];  Service: General;  Laterality: N/A;   COLONOSCOPY  June 2013   Dr. Benson Hawkins: sessile serrated adenoma   COLONOSCOPY WITH PROPOFOL N/A 03/13/2015    Procedure: COLONOSCOPY WITH PROPOFOL;  Surgeon: Gabrielle Ada, MD;  Location: [facility];  Service: Endoscopy;  Laterality: N/A;   DILATION AND CURETTAGE OF UTERUS     ELBOW LIGAMENT RECONSTRUCTION Right 11/15/2021   Procedure: ELBOW LIGAMENT REPAIR/ LCL;  Surgeon: Gabrielle Gash, MD;  Location: [place];  Service: Orthopedics;  Laterality: Right;  Regional block   FLEXIBLE SIGMOIDOSCOPY N/A 07/18/2014   Dr. Benson Hawkins: mild left-sided colitis, biopsies with quiescent UC   HARDWARE REMOVAL Right 01/13/2022   Procedure: HARDWARE REMOVAL/ELBOW;  Surgeon: Gabrielle Gash, MD;  Location: [place];  Service: Orthopedics;  Laterality: Right;   MYOMECTOMY     ORIF RADIAL FRACTURE Right 11/15/2021   Procedure: RIGHT RADIAL HEAD ARTHROPLASTY;  Surgeon: Gabrielle Gash, MD;  Location: [place];  Service: Orthopedics;  Laterality: Right;  Regional block   ORIF ULNAR FRACTURE Right 11/15/2021   Procedure: OPEN REDUCTION INTERNAL FIXATION (ORIF) RIGHT ULNAR FRACTURE;  Surgeon: Gabrielle Gash, MD;  Location: [place];  Service: Orthopedics;  Laterality: Right;  Regional block   TONSILLECTOMY      Current Medications: Current Meds  Medication Sig   albuterol (VENTOLIN HFA) 108 (90 Base) MCG/ACT inhaler Inhale 2 puffs into the lungs every 6 (six) hours as needed for wheezing or shortness of breath.   amitriptyline (ELAVIL) 50 MG tablet Take 1 tablet (50 mg total) by mouth at bedtime.   atorvastatin (LIPITOR) 10 MG tablet Take 1 tablet (10 mg total) by mouth daily.   busPIRone (BUSPAR) 5 MG tablet Take 1 tablet (5 mg total) by mouth at bedtime.   cetirizine (ZYRTEC) 10 MG tablet Take 10 mg by mouth daily.   dicyclomine (BENTYL) 20 MG tablet Take 20 mg by mouth 4 (four) times daily -  before meals and at bedtime. Up to four times daily as needed   eszopiclone (LUNESTA) 2 MG TABS tablet Take immediately before bedtime   folic acid (FOLVITE) 1 MG tablet Take  3 mg by mouth daily.   golimumab (SIMPONI ARIA) 50 MG/4ML SOLN injection 50 mg See admin instructions. Infusion every 8 weeks, [place] [facility]   methotrexate (50 MG/ML) 1 g injection Inject into the vein every Monday. 0.96m every Monday   olmesartan (BENICAR) 20 MG tablet Take 1 tablet (20 mg total) by mouth daily.   omeprazole (PRILOSEC) 40 MG capsule Take 1 capsule (40 mg total) by mouth daily.  OVER THE COUNTER MEDICATION Aleve cream for pain   polyethylene glycol (MIRALAX / GLYCOLAX) 17 g packet Take 17 g by mouth daily as needed.   tiZANidine (ZANAFLEX) 2 MG tablet Take 2 mg by mouth 3 (three) times daily.   Vitamin D, Ergocalciferol, (DRISDOL) 1.25 MG (50000 UNIT) CAPS capsule Take 1 capsule (50,000 Units total) by mouth 2 (two) times a week. 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    Patient has 2 years of college education.    Patient has 0 children.    Patient has a boyfriend.          Social Determinants of Health    Financial Resource Strain: Medium Risk (05/05/2020)   Overall Financial Resource Strain (CARDIA)    Difficulty of Paying Living Expenses: Somewhat hard  Food Insecurity: No Food Insecurity (02/09/2021)   Hunger Vital Sign    Worried About Running Out of Food in the Last Year: Never true    Ran Out of Food in the Last Year: Never true  Transportation Needs: No Transportation Needs (02/09/2021)   PRAPARE - Hydrologist (Medical): No    Lack of Transportation (Non-Medical): No  Physical Activity: Sufficiently Active (06/04/2019)   Exercise Vital Sign    Days of Exercise per Week: 7 days    Minutes of Exercise per Session: 30 min  Stress: No Stress Concern Present (06/04/2019)   Center    Feeling of Stress : Not at all  Social Connections: Not on file     Family History: The patient's family history includes Breast cancer (age of onset: 81) in her mother; Cancer in her maternal grandmother; Heart disease in her father and paternal grandmother; Heart failure in her father and paternal grandmother.  ROS:   Please see the history of present illness.     All other systems reviewed and are negative.  EKGs/Labs/Other Studies Reviewed:    The following studies were reviewed today:   EKG:  EKG is  ordered today.  The ekg ordered today demonstrates   EKG 10/06/2021- NSR, LVH  EKG 01/05/2022- NSR, LVH  11/3- NSR, LVH  Recent Labs: 09/21/2021: BUN 13; Creatinine, Ser 0.81; Hemoglobin 13.7; Platelets 185; Potassium 4.0; Sodium 136    Recent Lipid Panel    Component Value Date/Time   CHOL 187 05/30/2022 1059   TRIG 73 05/30/2022 1059   HDL 60 05/30/2022 1059   CHOLHDL  3.1 05/30/2022 1059   CHOLHDL 3.5 12/24/2009 0817   VLDL 10 12/24/2009 0817   LDLCALC 113 (H) 05/30/2022 1059     Risk Assessment/Calculations:           Physical Exam:    VS:    Vitals:   07/08/22 0821  BP:  122/82  Pulse: 89  SpO2: 98%      Wt Readings from Last 3 Encounters:  07/08/22 199 lb 12.8 oz (90.6 kg)  05/30/22 194 lb 6.4 oz (88.2 kg)  01/06/22 195 lb (88.5 kg)     GEN:  Well nourished, well developed in no acute distress HEENT: Normal NECK: No JVD; No carotid bruits LYMPHATICS: No lymphadenopathy CARDIAC: RRR, no murmurs, rubs, gallops RESPIRATORY:  Clear to auscultation without rales, wheezing or rhonchi  ABDOMEN: Soft, non-tender, non-distended MUSCULOSKELETAL:  No edema; No deformity  SKIN: Warm and dry NEUROLOGIC:  Alert and oriented x 3 PSYCHIATRIC:  Normal affect   ASSESSMENT:    #Palpitations: 7 day ziopatch  #LVH: Echo did not show hypertrophy. This EKG finding is benign.    #HTN: Continue with goal blood pressure < 130/80 mmHg. Continue Olmesartan 20 mg daily.   #CAC: noted to have coronary calcification.  LDL goal < 70 mg/dL. LDL 113. Start atorvastatin 20 mg daily [didn't start her lipitor]  #Cardio-Onc: Has hx of treatment with doxorubicin which is associated with cardiac dysfunction with incidence ~20%. She has no known hx of cardiotoxicity. She had radiation to the right side; carries lower risk of fibrotic valve dx/accelerated atherosclerosis  PLAN:    In order of problems listed above:  Start atorvastatin 20 mg daily Follow up fasting lipids 3 months 7 day ziopatch Follow up in 12 months       Medication Adjustments/Labs and Tests Ordered: Current medicines are reviewed at length with the patient today.  Concerns regarding medicines are outlined above.  No orders of the defined types were placed in this encounter.  No orders of the defined types were placed in this encounter.   There are no Patient Instructions on file for this visit.   Signed, Gabrielle Mayo, MD  07/08/2022 8:26 AM    [facility]

## 2022-07-08 NOTE — Patient Instructions (Signed)
Medication Instructions:  INCREASE ATORVASTATIN TO 54m ONCE DAILY  *If you need a refill on your cardiac medications before your next appointment, please call your pharmacy*  Lab Work: Please return for FASTING Blood Work in [place] No appointment needed, lab here at the office is open Monday-Friday from 8AM to 4PM and closed daily for lunch from 12:45-1:45.   If you have labs (blood work) drawn today and your tests are completely normal, you will receive your results only by: [place](if you have MyChart) OR A paper copy in the mail If you have any lab test that is abnormal or we need to change your treatment, we will call you to review the results.  Testing/Procedures:  Z[place] Long Term Monitor Instructions   Your physician has requested you wear your ZIO patch monitor___7____days.   This is a single patch monitor.  [employer] supplies one patch monitor per enrollment.  Additional stickers are not available.   Please do not apply patch if you will be having a Nuclear Stress Test, Echocardiogram, Cardiac CT, MRI, or Chest Xray during the time frame you would be wearing the monitor. The patch cannot be worn during these tests.  You cannot remove and re-apply the ZIO XT patch monitor.   Your ZIO patch monitor will be sent USPS Priority mail [facility] your home address. The monitor may also be mailed to a PO BOX if home delivery is not available.   It may take 3-5 days to receive your monitor after you have been enrolled.   Once you have received you monitor, please review enclosed instructions.  Your monitor has already been registered assigning a specific monitor serial # to you.   Applying the monitor   Shave hair from upper left chest.   Hold abrader disc by orange tab.  Rub abrader in 40 strokes over left upper chest as indicated in your monitor instructions.   Clean area with 4 enclosed alcohol pads .  Use all pads to assure are is cleaned  thoroughly.  Let dry.   Apply patch as indicated in monitor instructions.  Patch will be place under collarbone on left side of chest with arrow pointing upward.   Rub patch adhesive wings for 2 minutes.Remove white label marked "1".  Remove white label marked "2".  Rub patch adhesive wings for 2 additional minutes.   While looking in a mirror, press and release button in center of patch.  A small green light will flash 3-4 times .  This will be your only indicator the monitor has been turned on.     Do not shower for the first 24 hours.  You may shower after the first 24 hours.   Press button if you feel a symptom. You will hear a small click.  Record Date, Time and Symptom in the Patient Log Book.   When you are ready to remove patch, follow instructions on last 2 pages of Patient Log Book.  Stick patch monitor onto last page of Patient Log Book.   Place Patient Log Book in [place].  Use locking tab on box and tape box closed securely.  The Orange and [employer] Corporationhas p[employer]AC[employer]on it.  Please place in mailbox as soon as possible.  Your physician should have your test results approximately 7 days after the monitor has been [facility]   Call [place] [phone] you have questions regarding your ZIO  XT patch monitor.  Call them immediately if you see an orange light blinking on your monitor.   If your monitor falls off in less than 4 days contact our Monitor department at [phone].  If your monitor becomes loose or falls off after 4 days call [employer] at [phone] for suggestions on securing your monitor.   Follow-Up: At [facility], you and your health needs are our priority.  As part of our continuing mission to provide you with exceptional heart care, we have created designated Provider Care Teams.  These Care Teams include your primary Cardiologist (physician) and Advanced Practice Providers (APPs -  Physician Assistants  and Nurse Practitioners) who all work together to provide you with the care you need, when you need it.  Your next appointment:   1 year(s)  The format for your next appointment:   In Person  Provider:   Janina Mayo, MD

## 2022-07-11 ENCOUNTER — Ambulatory Visit: Payer: Self-pay

## 2022-07-11 NOTE — Patient Outreach (Signed)
  Care Coordination   07/11/2022 Name: Gabrielle Hawkins MRN: [id number] DOB: 11-04-1965   Care Coordination Outreach Attempts:  An unsuccessful telephone outreach was attempted for a scheduled appointment today.  Follow Up Plan:  Additional outreach attempts will be made to offer the patient care coordination information and services.   Encounter Outcome:  No Answer  Care Coordination Interventions Activated:  No   Care Coordination Interventions:  No, not indicated    Barb Merino, RN, BSN, CCM Care Management Coordinator [facility] Care Management Direct Phone: [phone]

## 2022-07-12 DIAGNOSIS — I10 Essential (primary) hypertension: Secondary | ICD-10-CM

## 2022-07-12 DIAGNOSIS — R002 Palpitations: Secondary | ICD-10-CM

## 2022-07-13 ENCOUNTER — Ambulatory Visit
Admission: RE | Admit: 2022-07-13 | Discharge: 2022-07-13 | Disposition: A | Discharge status: Home / Self Care | Payer: Medicare Other | Source: Ambulatory Visit | Attending: Nurse Practitioner | Admitting: Nurse Practitioner

## 2022-07-13 DIAGNOSIS — Z1231 Encounter for screening mammogram for malignant neoplasm of breast: Secondary | ICD-10-CM

## 2022-07-14 ENCOUNTER — Encounter: Payer: Self-pay | Admitting: Internal Medicine

## 2022-07-18 DIAGNOSIS — M459 Ankylosing spondylitis of unspecified sites in spine: Secondary | ICD-10-CM | POA: Diagnosis not present

## 2022-07-20 ENCOUNTER — Telehealth: Payer: Self-pay | Admitting: Nurse Practitioner

## 2022-07-20 NOTE — Telephone Encounter (Signed)
Left message for patient to call back and schedule Medicare Annual Wellness Visit (AWV) either virtually or in office. Left  my Herbie Drape number [phone]   Last AWV  06/04/19 please schedule with Nurse Health Adviser   45 min for awv-i and in office appointments 30 min for awv-s  phone/virtual appointments

## 2022-07-25 ENCOUNTER — Encounter: Payer: Self-pay | Admitting: Nurse Practitioner

## 2022-07-25 ENCOUNTER — Telehealth (INDEPENDENT_AMBULATORY_CARE_PROVIDER_SITE_OTHER): Payer: Medicare Other | Admitting: Nurse Practitioner

## 2022-07-25 VITALS — BP 130/77 | HR 84 | Temp 98.6°F

## 2022-07-25 DIAGNOSIS — I2584 Coronary atherosclerosis due to calcified coronary lesion: Secondary | ICD-10-CM

## 2022-07-25 DIAGNOSIS — H04203 Unspecified epiphora, bilateral lacrimal glands: Secondary | ICD-10-CM

## 2022-07-25 DIAGNOSIS — R002 Palpitations: Secondary | ICD-10-CM | POA: Diagnosis not present

## 2022-07-25 DIAGNOSIS — T7840XA Allergy, unspecified, initial encounter: Secondary | ICD-10-CM

## 2022-07-25 DIAGNOSIS — I2510 Atherosclerotic heart disease of native coronary artery without angina pectoris: Secondary | ICD-10-CM | POA: Diagnosis not present

## 2022-07-25 DIAGNOSIS — I10 Essential (primary) hypertension: Secondary | ICD-10-CM | POA: Diagnosis not present

## 2022-07-25 MED ORDER — EPINEPHRINE 0.3 MG/0.3ML IJ SOAJ: 0.3000 mg | INTRAMUSCULAR | 2 refills | Status: AC | PRN

## 2022-07-25 NOTE — Progress Notes (Signed)
Virtual Visit via Mychart   This visit type was conducted due to national recommendations for restrictions regarding the COVID-19 Pandemic (e.g. social distancing) in an effort to limit this patient's exposure and mitigate transmission in our community.  Due to her co-morbid illnesses, this patient is at least at moderate risk for complications without adequate follow up.  This format is felt to be most appropriate for this patient at this time.  All issues noted in this document were discussed and addressed.  A limited physical exam was performed with this format.    This visit type was conducted due to national recommendations for restrictions regarding the COVID-19 Pandemic (e.g. social distancing) in an effort to limit this patient's exposure and mitigate transmission in our community.  Patients identity confirmed using two different identifiers.  This format is felt to be most appropriate for this patient at this time.  All issues noted in this document were discussed and addressed.  No physical exam was performed (except for noted visual exam findings with Video Visits).    Date:  07/25/2022   ID:  Gabrielle Hawkins, DOB [birth year], MRN [id number]  Patient Location:  Home - spoke with Gabrielle Hawkins  Provider location:   Office    Chief Complaint:  f/u statin  History of Present Illness:    Gabrielle Hawkins is a 56 y.o. female who presents via video conferencing for a telehealth visit today.    The patient does not have symptoms concerning for COVID-19 infection (fever, chills, cough, or new shortness of breath).   Virtual visit for new cholesterol medications. She is having leg cramps. She has cut back on fried foods. She is using her air fryer more. She is baking her food more. She is increasing her intake of water. She will use aleve cream. She has restarted her infusions for ulcerative next due on Dec 15th. She had to reschedule her mammogram due to wearing a hear  monitor for 7 days but was not able to wear for the full 7 days.   She is interested in being tested for allergies. Concerned about being allergic to dogs, pecan trees and other environmental      Past Medical History:  Diagnosis Date   Anemia 1998   Anxiety    Arthritis    big toe    Asthma    BRCA1 negative    BRCA2 negative    Breast cancer ([facility]) 2006   chem/radiation/2years tamoxifen   Chronic neck and back pain    Crohn's disease ([facility])    Depression    Endometriosis    Fibroid    GERD (gastroesophageal reflux disease)    History of chemotherapy 01/2005   Hx of tamoxifen therapy    Hx of transfusion of packed red blood cells    Hypertension    IBS (irritable bowel syndrome)    Kidney stones    Lymphedema of arm    Migraines    just at dx of breast CA   Mucinous cystadenoma of ovary    left   Neuropathy    Paresthesias    Peripheral neuropathy    Radiation 04/2005   Ulcerative colitis ([facility])    with chronic diarrhea   Urinary tract infection    x 3   Vertigo 2014   Vitamin D deficiency    Past Surgical History:  Procedure Laterality Date   ABDOMINAL HYSTERECTOMY  2007   TAH/BSO   ANTERIOR INTEROSSEOUS NERVE DECOMPRESSION Right 01/13/2022  Procedure: ANTERIOR INTEROSSEOUS NERVE DECOMPRESSION;  Surgeon: Hiram Gash, MD;  Location: [facility];  Service: Orthopedics;  Laterality: Right;   BREAST SURGERY  2006   LUMPECTOMY   CHOLECYSTECTOMY     CHOLECYSTECTOMY N/A 04/29/2014   Procedure: LAPAROSCOPIC CHOLECYSTECTOMY WITH INTRAOPERATIVE CHOLANGIOGRAM;  Surgeon: Edward Jolly, MD;  Location: [facility];  Service: General;  Laterality: N/A;   COLONOSCOPY  June 2013   Dr. Benson Norway: sessile serrated adenoma   COLONOSCOPY WITH PROPOFOL N/A 03/13/2015   Procedure: COLONOSCOPY WITH PROPOFOL;  Surgeon: [name], MD;  Location: [facility];  Service: Endoscopy;  Laterality: N/A;   DILATION AND CURETTAGE OF UTERUS     ELBOW LIGAMENT RECONSTRUCTION Right  11/15/2021   Procedure: ELBOW LIGAMENT REPAIR/ LCL;  Surgeon: Hiram Gash, MD;  Location: [facility];  Service: Orthopedics;  Laterality: Right;  Regional block   FLEXIBLE SIGMOIDOSCOPY N/A 07/18/2014   Dr. Benson Norway: mild left-sided colitis, biopsies with quiescent UC   HARDWARE REMOVAL Right 01/13/2022   Procedure: HARDWARE REMOVAL/ELBOW;  Surgeon: Hiram Gash, MD;  Location: [facility];  Service: Orthopedics;  Laterality: Right;   MYOMECTOMY     ORIF RADIAL FRACTURE Right 11/15/2021   Procedure: RIGHT RADIAL HEAD ARTHROPLASTY;  Surgeon: Hiram Gash, MD;  Location: [facility];  Service: Orthopedics;  Laterality: Right;  Regional block   ORIF ULNAR FRACTURE Right 11/15/2021   Procedure: OPEN REDUCTION INTERNAL FIXATION (ORIF) RIGHT ULNAR FRACTURE;  Surgeon: Hiram Gash, MD;  Location: [facility];  Service: Orthopedics;  Laterality: Right;  Regional block   TONSILLECTOMY       Current Meds  Medication Sig   albuterol (VENTOLIN HFA) 108 (90 Base) MCG/ACT inhaler Inhale 2 puffs into the lungs every 6 (six) hours as needed for wheezing or shortness of breath.   amitriptyline (ELAVIL) 50 MG tablet Take 1 tablet (50 mg total) by mouth at bedtime.   atorvastatin (LIPITOR) 20 MG tablet Take 1 tablet (20 mg total) by mouth daily.   busPIRone (BUSPAR) 5 MG tablet Take 1 tablet (5 mg total) by mouth at bedtime.   cetirizine (ZYRTEC) 10 MG tablet Take 10 mg by mouth daily.   dicyclomine (BENTYL) 20 MG tablet Take 20 mg by mouth 4 (four) times daily -  before meals and at bedtime. Up to four times daily as needed   EPINEPHrine (EPIPEN 2-PAK) 0.3 mg/0.3 mL IJ SOAJ injection Inject 0.3 mg into the muscle as needed for anaphylaxis.   eszopiclone (LUNESTA) 2 MG TABS tablet Take immediately before bedtime   folic acid (FOLVITE) 1 MG tablet Take 3 mg by mouth daily.   golimumab (SIMPONI ARIA) 50 MG/4ML SOLN injection 50 mg See admin instructions.  Infusion every 8 weeks, [facility]   methotrexate (50 MG/ML) 1 g injection Inject into the vein every Monday. 0.60m every Monday   olmesartan (BENICAR) 20 MG tablet Take 1 tablet (20 mg total) by mouth daily.   omeprazole (PRILOSEC) 40 MG capsule Take 1 capsule (40 mg total) by mouth daily.   OVER THE COUNTER MEDICATION Aleve cream for pain   polyethylene glycol (MIRALAX / GLYCOLAX) 17 g packet Take 17 g by mouth daily as needed.   tiZANidine (ZANAFLEX) 2 MG tablet Take 2 mg by mouth 3 (three) times daily.   Vitamin D, Ergocalciferol, (DRISDOL) 1.25 MG (50000 UNIT) CAPS capsule Take 1 capsule (50,000 Units total) by mouth 2 (two) times a week. Monday and  Wednesday     Allergies:   Humira [adalimumab], Ampicillin, Codeine, Compazine [prochlorperazine maleate], Cymbalta [duloxetine hcl], Imitrex [sumatriptan base], Lyrica [pregabalin], Metoclopramide hcl, Percocet [oxycodone-acetaminophen], Effexor [venlafaxine hydrochloride], Fentanyl, and Tape   Social History   Tobacco Use   Smoking status: Never   Smokeless tobacco: Never  Vaping Use   Vaping Use: Never used  Substance Use Topics   Alcohol use: No   Drug use: Yes    Types: Marijuana     Family Hx: The patient's family history includes Breast cancer (age of onset: 15) in her mother; Cancer in her maternal grandmother; Heart disease in her father and paternal grandmother; Heart failure in her father and paternal grandmother.  ROS:   Please see the history of present illness.    Review of Systems  Constitutional: Negative.   HENT:         Itchy, watery eyes and feel like throat closing up. She is taking benadryl.   Respiratory: Negative.    Cardiovascular: Negative.   Skin: Negative.   Neurological: Negative.   Psychiatric/Behavioral: Negative.      All other systems reviewed and are negative.   Labs/Other Tests and Data Reviewed:    Recent Labs: 09/21/2021: BUN 13; Creatinine, Ser 0.81; Hemoglobin 13.7;  Platelets 185; Potassium 4.0; Sodium 136   Recent Lipid Panel Lab Results  Component Value Date/Time   CHOL 187 05/30/2022 10:59 AM   TRIG 73 05/30/2022 10:59 AM   HDL 60 05/30/2022 10:59 AM   CHOLHDL 3.1 05/30/2022 10:59 AM   CHOLHDL 3.5 12/24/2009 08:17 AM   LDLCALC 113 (H) 05/30/2022 10:59 AM    Wt Readings from Last 3 Encounters:  07/08/22 199 lb 12.8 oz (90.6 kg)  05/30/22 194 lb 6.4 oz (88.2 kg)  01/06/22 195 lb (88.5 kg)     Exam:    Vital Signs:  BP 130/77   Pulse 84   Temp 98.6 F (37 C) (Oral)     Physical Exam Vitals reviewed.  Constitutional:      General: She is not in acute distress.    Appearance: Normal appearance.  Pulmonary:     Effort: Pulmonary effort is normal. No respiratory distress.     Breath sounds: No wheezing (none grossly audible).  Skin:    General: Skin is warm.     Capillary Refill: Capillary refill takes less than 2 seconds.  Neurological:     General: No focal deficit present.     Mental Status: She is alert and oriented to person, place, and time.     Cranial Nerves: No cranial nerve deficit.     Motor: No weakness.  Psychiatric:        Mood and Affect: Mood normal.        Behavior: Behavior normal.        Thought Content: Thought content normal.        Judgment: Judgment normal.     ASSESSMENT & PLAN:    1. Coronary artery calcification She is tolerating statin fairly well has minimal cramps and improving.   2. Watery eyes Continue antihistamines and will refer to Allergist in [place] Dr. Harlon Flor at patients request. I have also sent an Epi since reports throat tightening.  - Ambulatory referral to Allergy - EPINEPHrine (EPIPEN 2-PAK) 0.3 mg/0.3 mL IJ SOAJ injection; Inject 0.3 mg into the muscle as needed for anaphylaxis.  Dispense: 1 each; Refill: 2  3. Allergy, initial encounter  - Ambulatory referral to Allergy - EPINEPHrine (EPIPEN  2-PAK) 0.3 mg/0.3 mL IJ SOAJ injection; Inject 0.3 mg into the muscle as  needed for anaphylaxis.  Dispense: 1 each; Refill: 2  COVID-19 Education: The signs and symptoms of COVID-19 were discussed with the patient and how to seek care for testing (follow up with PCP or arrange E-visit).  The importance of social distancing was discussed today.  Patient Risk:   After full review of this patients clinical status, I feel that they are at least moderate risk at this time.  Time:   Today, I have spent 15.25 minutes/ seconds with the patient with telehealth technology discussing above diagnoses.     Medication Adjustments/Labs and Tests Ordered: Current medicines are reviewed at length with the patient today.  Concerns regarding medicines are outlined above.   Tests Ordered: Orders Placed This Encounter  Procedures   Ambulatory referral to Allergy    Medication Changes: Meds ordered this encounter  Medications   EPINEPHrine (EPIPEN 2-PAK) 0.3 mg/0.3 mL IJ SOAJ injection    Sig: Inject 0.3 mg into the muscle as needed for anaphylaxis.    Dispense:  1 each    Refill:  2    Disposition:  Follow up prn, she does need AWV appt virtual or in person. She is to call back to [facility] or we can schedule  Signed, [name], FNP

## 2022-07-25 NOTE — Patient Instructions (Signed)

## 2022-08-03 ENCOUNTER — Ambulatory Visit: Payer: Medicare Other | Admitting: Allergy & Immunology

## 2022-08-03 ENCOUNTER — Other Ambulatory Visit: Payer: Self-pay | Admitting: Nurse Practitioner

## 2022-08-05 ENCOUNTER — Ambulatory Visit
Admission: RE | Admit: 2022-08-05 | Discharge: 2022-08-05 | Disposition: A | Discharge status: Home / Self Care | Payer: Medicare Other | Source: Ambulatory Visit | Attending: Nurse Practitioner | Admitting: Nurse Practitioner

## 2022-08-05 DIAGNOSIS — Z1231 Encounter for screening mammogram for malignant neoplasm of breast: Secondary | ICD-10-CM

## 2022-08-05 HISTORY — DX: Personal history of irradiation: Z92.3

## 2022-08-05 HISTORY — DX: Personal history of antineoplastic chemotherapy: Z92.21

## 2022-08-09 ENCOUNTER — Telehealth: Payer: Self-pay | Admitting: *Deleted

## 2022-08-09 NOTE — Progress Notes (Signed)
  Care Coordination Note  08/09/2022 Name: HALAINA VANDUZER MRN: [id number] DOB: 1966/05/26  Gabrielle Hawkins is a 56 y.o. year old female who is a primary care patient of [name], [place] and is actively engaged with the care management team. I reached out to [name] by phone today to assist with re-scheduling a follow up visit with the RN Case Manager  Follow up plan: Unsuccessful telephone outreach attempt made. A HIPAA compliant phone message was left for the patient providing contact information and requesting a return call.   [place]  Direct Dial: [phone]

## 2022-08-11 ENCOUNTER — Telehealth: Payer: Self-pay | Admitting: Nurse Practitioner

## 2022-08-11 NOTE — Telephone Encounter (Signed)
Left message for patient to call back and schedule Medicare Annual Wellness Visit (AWV) either virtually or in office. Left  my Gabrielle Hawkins number [phone]   Last AWV 06/04/19 please schedule with Nurse Health Adviser   45 min for awv-i and in office appointments 30 min for awv-s  phone/virtual appointments

## 2022-08-15 DIAGNOSIS — K5190 Ulcerative colitis, unspecified, without complications: Secondary | ICD-10-CM | POA: Diagnosis not present

## 2022-08-15 DIAGNOSIS — M459 Ankylosing spondylitis of unspecified sites in spine: Secondary | ICD-10-CM | POA: Diagnosis not present

## 2022-08-16 ENCOUNTER — Ambulatory Visit: Payer: Self-pay

## 2022-08-16 NOTE — Patient Outreach (Signed)
  Care Coordination   Follow Up Visit Note   08/16/2022 Name: Gabrielle Hawkins MRN: [id number] DOB: November 07, 1965  Gabrielle Hawkins is a 56 y.o. year old female who sees [name], [place] for primary care. I spoke with  [name] by phone today.  What matters to the patients health and wellness today?  Patient will focus on better managing her Cholesterol and Vitamin D.     Goals Addressed               This Visit's Progress     Patient Stated     I started Cholesterol medicine (pt-stated)        Care Coordination Interventions: Provider established cholesterol goals reviewed Counseled on importance of regular laboratory monitoring as prescribed Provided HLD educational materials Reviewed role and benefits of statin for ASCVD risk reduction Reviewed importance of limiting foods high in cholesterol Reviewed exercise goals and target of 150 minutes per week         My vitamin D is still low (pt-stated)        Care Coordination Interventions: Evaluation of current treatment plan related to Vitamin D deficiency and patient's adherence to plan as established by provider Review of patient status, including review of consultant's reports, relevant laboratory and other test results, and medications completed Educated patient regarding the role Vitamin D plays in achieving optimal health with muscle, bone and immune health            SDOH assessments and interventions completed:  No     Care Coordination Interventions:  Yes, provided   Follow up plan: Follow up call scheduled for 11/15/22 @0900  AM    Encounter Outcome:  Pt. Visit Completed

## 2022-08-16 NOTE — Patient Instructions (Signed)
Visit Information  Thank you for taking time to visit with me today. Please don't hesitate to contact me if I can be of assistance to you.   Following are the goals we discussed today:   Goals Addressed               This Visit's Progress     Patient Stated     I started Cholesterol medicine (pt-stated)        Care Coordination Interventions: Provider established cholesterol goals reviewed Counseled on importance of regular laboratory monitoring as prescribed Provided HLD educational materials Reviewed role and benefits of statin for ASCVD risk reduction Reviewed importance of limiting foods high in cholesterol Reviewed exercise goals and target of 150 minutes per week         My vitamin D is still low (pt-stated)        Care Coordination Interventions: Evaluation of current treatment plan related to Vitamin D deficiency and patient's adherence to plan as established by provider Review of patient status, including review of consultant's reports, relevant laboratory and other test results, and medications completed Educated patient regarding the role Vitamin D plays in achieving optimal health with muscle, bone and immune health            Our next appointment is by telephone on 11/15/22 at 0900 AM  Please call the care guide team at [phone] if you need to cancel or reschedule your appointment.   If you are experiencing a Mental Health or [place] or need someone to talk to, please call 1-800-273-TALK (toll free, 24 hour hotline)  Patient verbalizes understanding of instructions and care plan provided today and agrees to view in [place]. Active MyChart status and patient understanding of how to access instructions and care plan via MyChart confirmed with patient.     Barb Merino, RN, BSN, CCM Care Management Coordinator [facility] Care Management  Direct Phone: [phone]

## 2022-08-16 NOTE — Progress Notes (Signed)
  Care Coordination Note  08/16/2022 Name: YARAH FUENTE MRN: [id number] DOB: 05-16-1966  SUZETTE FLAGLER is a 56 y.o. year old female who is a primary care patient of [name], [place] and is actively engaged with the care management team. I reached out to [name] by phone today to assist with re-scheduling a follow up visit with the RN Case Manager  Follow up plan: Telephone appointment with care management team member scheduled for:08/16/22  [place]  Direct Dial: [phone]

## 2022-08-24 ENCOUNTER — Ambulatory Visit (INDEPENDENT_AMBULATORY_CARE_PROVIDER_SITE_OTHER): Payer: Medicare Other

## 2022-08-24 VITALS — Ht 68.5 in | Wt 199.0 lb

## 2022-08-24 DIAGNOSIS — Z0000 Encounter for general adult medical examination without abnormal findings: Secondary | ICD-10-CM

## 2022-08-24 NOTE — Patient Instructions (Signed)
Gabrielle Hawkins , Thank you for taking time to come for your Medicare Wellness Visit. I appreciate your ongoing commitment to your health goals. Please review the following plan we discussed and let me know if I can assist you in the future.   These are the goals we discussed:  Goals       I started Cholesterol medicine (pt-stated)      Care Coordination Interventions: Provider established cholesterol goals reviewed Counseled on importance of regular laboratory monitoring as prescribed Provided HLD educational materials Reviewed role and benefits of statin for ASCVD risk reduction Reviewed importance of limiting foods high in cholesterol Reviewed exercise goals and target of 150 minutes per week         Manage My Medicine      Timeframe:  Long-Range Goal Priority:  High Start Date:                             Expected End Date:                       Follow Up Date 12/09/2022   - call for medicine refill 2 or 3 days before it runs out - call if I am sick and can't take my medicine - keep a list of all the medicines I take; vitamins and herbals too - learn to read medicine labels - use a pillbox to sort medicine    Why is this important?   These steps will help you keep on track with your medicines.   Notes:  -Patient to call and let PCP team know when she has insurance so she can be rescheduled for a follow up visit with the PCP team to have a visit, and complete updated labs.       My vitamin D is still low (pt-stated)      Care Coordination Interventions: Evaluation of current treatment plan related to Vitamin D deficiency and patient's adherence to plan as established by provider Review of patient status, including review of consultant's reports, relevant laboratory and other test results, and medications completed Educated patient regarding the role Vitamin D plays in achieving optimal health with muscle, bone and immune health          Patient Stated      06/04/2019,  wants to survive ulcerative colitis      Patient Stated      08/24/2022, wants to lose 15 pounds        This is a list of the screening recommended for you and due dates:  Health Maintenance  Topic Date Due   Zoster (Shingles) Vaccine (1 of 2) Never done   COVID-19 Vaccine (4 - 2023-24 season) 05/06/2022   HIV Screening  05/31/2023*   Mammogram  08/06/2023   Medicare Annual Wellness Visit  08/25/2023   Colon Cancer Screening  03/12/2025   DTaP/Tdap/Td vaccine (2 - Td or Tdap) 05/02/2031   Flu Shot  Completed   Hepatitis C Screening: USPSTF Recommendation to screen - Ages 18-79 yo.  Completed   HPV Vaccine  Aged Out   Pap Smear  Discontinued  *Topic was postponed. The date shown is not the original due date.    Advanced directives: Advance directive discussed with you today.   Conditions/risks identified: none  Next appointment: Follow up in one year for your annual wellness visit.   Preventive Care 40-64 Years, Female Preventive care refers to lifestyle choices and  visits with your health care provider that can promote health and wellness. What does preventive care include? A yearly physical exam. This is also called an annual well check. Dental exams once or twice a year. Routine eye exams. Ask your health care provider how often you should have your eyes checked. Personal lifestyle choices, including: Daily care of your teeth and gums. Regular physical activity. Eating a healthy diet. Avoiding tobacco and drug use. Limiting alcohol use. Practicing safe sex. Taking low-dose aspirin daily starting at age 79. Taking vitamin and mineral supplements as recommended by your health care provider. What happens during an annual well check? The services and screenings done by your health care provider during your annual well check will depend on your age, overall health, lifestyle risk factors, and family history of disease. Counseling  Your health care provider may ask you  questions about your: Alcohol use. Tobacco use. Drug use. Emotional well-being. Home and relationship well-being. Sexual activity. Eating habits. Work and work Statistician. Method of birth control. Menstrual cycle. Pregnancy history. Screening  You may have the following tests or measurements: Height, weight, and BMI. Blood pressure. Lipid and cholesterol levels. These may be checked every 5 years, or more frequently if you are over 58 years old. Skin check. Lung cancer screening. You may have this screening every year starting at age 49 if you have a 30-pack-year history of smoking and currently smoke or have quit within the past 15 years. Fecal occult blood test (FOBT) of the stool. You may have this test every year starting at age 27. Flexible sigmoidoscopy or colonoscopy. You may have a sigmoidoscopy every 5 years or a colonoscopy every 10 years starting at age 58. Hepatitis C blood test. Hepatitis B blood test. Sexually transmitted disease (STD) testing. Diabetes screening. This is done by checking your blood sugar (glucose) after you have not eaten for a while (fasting). You may have this done every 1-3 years. Mammogram. This may be done every 1-2 years. Talk to your health care provider about when you should start having regular mammograms. This may depend on whether you have a family history of breast cancer. BRCA-related cancer screening. This may be done if you have a family history of breast, ovarian, tubal, or peritoneal cancers. Pelvic exam and Pap test. This may be done every 3 years starting at age 28. Starting at age 56, this may be done every 5 years if you have a Pap test in combination with an HPV test. Bone density scan. This is done to screen for osteoporosis. You may have this scan if you are at high risk for osteoporosis. Discuss your test results, treatment options, and if necessary, the need for more tests with your health care provider. Vaccines  Your health  care provider may recommend certain vaccines, such as: Influenza vaccine. This is recommended every year. Tetanus, diphtheria, and acellular pertussis (Tdap, Td) vaccine. You may need a Td booster every 10 years. Zoster vaccine. You may need this after age 43. Pneumococcal 13-valent conjugate (PCV13) vaccine. You may need this if you have certain conditions and were not previously vaccinated. Pneumococcal polysaccharide (PPSV23) vaccine. You may need one or two doses if you smoke cigarettes or if you have certain conditions. Talk to your health care provider about which screenings and vaccines you need and how often you need them. This information is not intended to replace advice given to you by your health care provider. Make sure you discuss any questions you have with your  health care provider. Document Released: 09/18/2015 Document Revised: 05/11/2016 Document Reviewed: 06/23/2015 Elsevier Interactive Patient Education  2017 [place] Prevention in the Home Falls can cause injuries. They can happen to people of all ages. There are many things you can do to make your home safe and to help prevent falls. What can I do on the outside of my home? Regularly fix the edges of walkways and driveways and fix any cracks. Remove anything that might make you trip as you walk through a door, such as a raised step or threshold. Trim any bushes or trees on the path to your home. Use bright outdoor lighting. Clear any walking paths of anything that might make someone trip, such as rocks or tools. Regularly check to see if handrails are loose or broken. Make sure that both sides of any steps have handrails. Any raised decks and porches should have guardrails on the edges. Have any leaves, snow, or ice cleared regularly. Use sand or salt on walking paths during winter. Clean up any spills in your garage right away. This includes oil or grease spills. What can I do in the bathroom? Use  night lights. Install grab bars by the toilet and in the tub and shower. Do not use towel bars as grab bars. Use non-skid mats or decals in the tub or shower. If you need to sit down in the shower, use a plastic, non-slip stool. Keep the floor dry. Clean up any water that spills on the floor as soon as it happens. Remove soap buildup in the tub or shower regularly. Attach bath mats securely with double-sided non-slip rug tape. Do not have throw rugs and other things on the floor that can make you trip. What can I do in the bedroom? Use night lights. Make sure that you have a light by your bed that is easy to reach. Do not use any sheets or blankets that are too big for your bed. They should not hang down onto the floor. Have a firm chair that has side arms. You can use this for support while you get dressed. Do not have throw rugs and other things on the floor that can make you trip. What can I do in the kitchen? Clean up any spills right away. Avoid walking on wet floors. Keep items that you use a lot in easy-to-reach places. If you need to reach something above you, use a strong step stool that has a grab bar. Keep electrical cords out of the way. Do not use floor polish or wax that makes floors slippery. If you must use wax, use non-skid floor wax. Do not have throw rugs and other things on the floor that can make you trip. What can I do with my stairs? Do not leave any items on the stairs. Make sure that there are handrails on both sides of the stairs and use them. Fix handrails that are broken or loose. Make sure that handrails are as long as the stairways. Check any carpeting to make sure that it is firmly attached to the stairs. Fix any carpet that is loose or worn. Avoid having throw rugs at the top or bottom of the stairs. If you do have throw rugs, attach them to the floor with carpet tape. Make sure that you have a light switch at the top of the stairs and the bottom of the  stairs. If you do not have them, ask someone to add them for you. What  else can I do to help prevent falls? Wear shoes that: Do not have high heels. Have rubber bottoms. Are comfortable and fit you well. Are closed at the toe. Do not wear sandals. If you use a stepladder: Make sure that it is fully opened. Do not climb a closed stepladder. Make sure that both sides of the stepladder are locked into place. Ask someone to hold it for you, if possible. Clearly mark and make sure that you can see: Any grab bars or handrails. First and last steps. Where the edge of each step is. Use tools that help you move around (mobility aids) if they are needed. These include: Canes. Walkers. Scooters. Crutches. Turn on the lights when you go into a dark area. Replace any light bulbs as soon as they burn out. Set up your furniture so you have a clear path. Avoid moving your furniture around. If any of your floors are uneven, fix them. If there are any pets around you, be aware of where they are. Review your medicines with your doctor. Some medicines can make you feel dizzy. This can increase your chance of falling. Ask your doctor what other things that you can do to help prevent falls. This information is not intended to replace advice given to you by your health care provider. Make sure you discuss any questions you have with your health care provider. Document Released: 06/18/2009 Document Revised: 01/28/2016 Document Reviewed: 09/26/2014 Elsevier Interactive Patient Education  2017 [employer].

## 2022-08-24 NOTE — Progress Notes (Signed)
I connected with Gabrielle Hawkins today by telephone and verified that I am speaking with the correct person using two identifiers. Location patient: home Location provider: work Persons participating in the virtual visit: Beth Goodlin, Glenna Durand LPN.   I discussed the limitations, risks, security and privacy concerns of performing an evaluation and management service by telephone and the availability of in person appointments. I also discussed with the patient that there may be a patient responsible charge related to this service. The patient expressed understanding and verbally consented to this telephonic visit.    Interactive audio and video telecommunications were attempted between this provider and patient, however failed, due to patient having technical difficulties OR patient did not have access to video capability.  We continued and completed visit with audio only.     Vital signs may be patient reported or missing.  Subjective:   Gabrielle Hawkins is a 56 y.o. female who presents for Medicare Annual (Subsequent) preventive examination.  Review of Systems     Cardiac Risk Factors include: advanced age (>20mn, >>45women);hypertension     Objective:    Today's Vitals   08/24/22 0811 08/24/22 0812  Weight: 199 lb (90.3 kg)   Height: 5' 8.5" (1.74 m)   PainSc:  7    Body mass index is 29.82 kg/m.     08/24/2022    8:17 AM 01/13/2022    9:27 AM 01/06/2022   10:31 AM 11/15/2021   11:23 AM 11/14/2021    3:19 PM 09/21/2021    1:38 PM 01/14/2021    5:25 PM  Advanced Directives  Does Patient Have a Medical Advance Directive? _0  No No  Does patient want to make changes to medical advance directive?      No - Patient declined   Would patient like information on creating a medical advance directive?  No - Patient declined No - Patient declined No - Patient declined No - Patient declined  No - Patient declined    Current Medications (verified) Outpatient  Encounter Medications as of 08/24/2022  Medication Sig   albuterol (VENTOLIN HFA) 108 (90 Base) MCG/ACT inhaler Inhale 2 puffs into the lungs every 6 (six) hours as needed for wheezing or shortness of breath.   amitriptyline (ELAVIL) 50 MG tablet Take 1 tablet (50 mg total) by mouth at bedtime.   atorvastatin (LIPITOR) 20 MG tablet Take 1 tablet (20 mg total) by mouth daily.   busPIRone (BUSPAR) 5 MG tablet Take 1 tablet (5 mg total) by mouth at bedtime.   cetirizine (ZYRTEC) 10 MG tablet Take 10 mg by mouth daily.   EPINEPHrine (EPIPEN 2-PAK) 0.3 mg/0.3 mL IJ SOAJ injection Inject 0.3 mg into the muscle as needed for anaphylaxis.   eszopiclone (LUNESTA) 2 MG TABS tablet Take immediately before bedtime   folic acid (FOLVITE) 1 MG tablet Take 3 mg by mouth daily.   golimumab (SIMPONI ARIA) 50 MG/4ML SOLN injection 50 mg See admin instructions. Infusion every 8 weeks, [facility]   methotrexate (50 MG/ML) 1 g injection Inject into the vein every Monday. 0.858mevery Monday   olmesartan (BENICAR) 20 MG tablet Take 1 tablet (20 mg total) by mouth daily.   omeprazole (PRILOSEC) 40 MG capsule Take 1 capsule (40 mg total) by mouth daily.   OVER THE COUNTER MEDICATION Aleve cream for pain   polyethylene glycol (MIRALAX / GLYCOLAX) 17 g packet Take 17 g by mouth daily as needed.   tiZANidine (ZANAFLEX) 2 MG  tablet Take 2 mg by mouth 3 (three) times daily.   Vitamin D, Ergocalciferol, (DRISDOL) 1.25 MG (50000 UNIT) CAPS capsule Take 1 capsule (50,000 Units total) by mouth 2 (two) times a week. Monday and Wednesday   [DISCONTINUED] dicyclomine (BENTYL) 20 MG tablet Take 20 mg by mouth 4 (four) times daily -  before meals and at bedtime. Up to four times daily as needed   [DISCONTINUED] inFLIXimab (REMICADE) 100 MG injection Inject 100 mg into the vein every 30 (thirty) days. Infuse by intravenous route every 4 weeks  Patient is receiving Inflectra brand verses Remicade brand due to insurance  (Patient not taking: Reported on 04/10/2020)   [DISCONTINUED] temazepam (RESTORIL) 7.5 MG capsule Take 1 capsule (7.5 mg total) by mouth at bedtime as needed for sleep. (Patient not taking: Reported on 07/09/2020)   No facility-administered encounter medications on file as of 08/24/2022.    Allergies (verified) Humira [adalimumab], Ampicillin, Codeine, Compazine [prochlorperazine maleate], Cymbalta [duloxetine hcl], Imitrex [sumatriptan base], Lyrica [pregabalin], Metoclopramide hcl, Percocet [oxycodone-acetaminophen], Effexor [venlafaxine hydrochloride], Fentanyl, and Tape   History: Past Medical History:  Diagnosis Date   Anemia 1998   Anxiety    Arthritis    big toe    Asthma    BRCA1 negative    BRCA2 negative    Breast cancer ([place]) 2006   chem/radiation/2years tamoxifen   Chronic neck and back pain    Crohn's disease ([place])    Depression    Endometriosis    Fibroid    GERD (gastroesophageal reflux disease)    History of chemotherapy 01/2005   Hx of tamoxifen therapy    Hx of transfusion of packed red blood cells    Hypertension    IBS (irritable bowel syndrome)    Kidney stones    Lymphedema of arm    Migraines    just at dx of breast CA   Mucinous cystadenoma of ovary    left   Neuropathy    Paresthesias    Peripheral neuropathy    Personal history of chemotherapy    Personal history of radiation therapy    Radiation 04/2005   Ulcerative colitis ([place])    with chronic diarrhea   Urinary tract infection    x 3   Vertigo 2014   Vitamin D deficiency    Past Surgical History:  Procedure Laterality Date   ABDOMINAL HYSTERECTOMY  09/05/2005   TAH/BSO   ANTERIOR INTEROSSEOUS NERVE DECOMPRESSION Right 01/13/2022   Procedure: ANTERIOR INTEROSSEOUS NERVE DECOMPRESSION;  Surgeon: Hiram Gash, MD;  Location: [facility];  Service: Orthopedics;  Laterality: Right;   BREAST LUMPECTOMY     BREAST SURGERY  09/05/2004   LUMPECTOMY   CHOLECYSTECTOMY      CHOLECYSTECTOMY N/A 04/29/2014   Procedure: LAPAROSCOPIC CHOLECYSTECTOMY WITH INTRAOPERATIVE CHOLANGIOGRAM;  Surgeon: Edward Jolly, MD;  Location: [facility];  Service: General;  Laterality: N/A;   COLONOSCOPY  02/04/2012   Dr. Benson Norway: sessile serrated adenoma   COLONOSCOPY WITH PROPOFOL N/A 03/13/2015   Procedure: COLONOSCOPY WITH PROPOFOL;  Surgeon: [name], MD;  Location: [facility];  Service: Endoscopy;  Laterality: N/A;   DILATION AND CURETTAGE OF UTERUS     ELBOW LIGAMENT RECONSTRUCTION Right 11/15/2021   Procedure: ELBOW LIGAMENT REPAIR/ LCL;  Surgeon: Hiram Gash, MD;  Location: [facility];  Service: Orthopedics;  Laterality: Right;  Regional block   FLEXIBLE SIGMOIDOSCOPY N/A 07/18/2014   Dr. Benson Norway: mild left-sided colitis, biopsies with quiescent UC  HARDWARE REMOVAL Right 01/13/2022   Procedure: HARDWARE REMOVAL/ELBOW;  Surgeon: Hiram Gash, MD;  Location: [facility];  Service: Orthopedics;  Laterality: Right;   MYOMECTOMY     ORIF RADIAL FRACTURE Right 11/15/2021   Procedure: RIGHT RADIAL HEAD ARTHROPLASTY;  Surgeon: Hiram Gash, MD;  Location: [facility];  Service: Orthopedics;  Laterality: Right;  Regional block   ORIF ULNAR FRACTURE Right 11/15/2021   Procedure: OPEN REDUCTION INTERNAL FIXATION (ORIF) RIGHT ULNAR FRACTURE;  Surgeon: Hiram Gash, MD;  Location: [facility];  Service: Orthopedics;  Laterality: Right;  Regional block   TONSILLECTOMY     Family History  Problem Relation Age of Onset   Breast cancer Mother 13   Heart failure Father    Heart disease Father    Cancer Maternal Grandmother        COLON   Heart failure Paternal Grandmother    Heart disease Paternal Grandmother    Social History   Socioeconomic History   Marital status: Single    Spouse name: Not on file   Number of children: Not on file   Years of education: Not on file   Highest education level: Not on file   Occupational History   Occupation: disability    Employer:  [employer]    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    Patient has 2 years of college education.    Patient has 0 children.    Patient has a boyfriend.          Social Determinants of Health   Financial Resource Strain: Low Risk  (08/24/2022)   Overall Financial Resource Strain (CARDIA)    Difficulty of Paying Living Expenses: Not hard at all  Food Insecurity: No Food Insecurity (08/24/2022)   Hunger Vital Sign    Worried About Running Out of Food in the Last Year: Never true    Ran Out of Food in the Last Year: Never true  Transportation Needs: No Transportation Needs (08/24/2022)   PRAPARE - Hydrologist (Medical): No    Lack of Transportation (Non-Medical): No  Physical Activity: Insufficiently Active (08/24/2022)   Exercise Vital Sign    Days of Exercise per Week: 7 days    Minutes of Exercise per Session: 20 min  Stress: No Stress Concern Present (08/24/2022)   [place]    Feeling of Stress : Not at all  Social Connections: Not on file    Tobacco Counseling Counseling given: Not Answered   Clinical Intake:  Pre-visit preparation completed: Yes  Pain : 0-10 Pain Score: 7  Pain Location: Shoulder Pain Orientation: Left, Right Pain Descriptors / Indicators: Aching Pain Onset: More than a month ago Pain Frequency: Constant     Nutritional Status: BMI 25 -29 Overweight Nutritional Risks: None Diabetes: No  How often do you need to have  someone help you when you read instructions, pamphlets, or other written materials from your doctor or pharmacy?: 1 - Never  Diabetic?  no  Interpreter Needed?: No  Information entered by :: NAllen LPN   Activities of Daily Living    08/24/2022    8:19 AM 08/23/2022    5:19 PM  In your present state of health, do you have any difficulty performing the following activities:  Hearing? 0 0  Vision? 0 0  Difficulty concentrating or making decisions? 0 0  Walking or climbing stairs? 0 0  Dressing or bathing? 0 0  Doing errands, shopping? 0 0  Preparing Food and eating ? N N  Using the Toilet? N N  In the past six months, have you accidently leaked urine? N N  Do you have problems with loss of bowel control? N N  Managing your Medications? N N  Managing your Finances? N N  Housekeeping or managing your Housekeeping? N N    Patient Care Team: [name], FNP as PCP - General (General Practice) Janina Mayo, MD as PCP - Cardiology (Cardiology) Gavin Pound, MD as Consulting Physician (Rheumatology) Rex Kras, [name], RN as [place] any recent Medical Services you may have received from other than [facility] providers in the past year (date may be approximate).     Assessment:   This is a routine wellness examination for Gabrielle Hawkins.  Hearing/Vision screen Vision Screening - Comments:: Regular eye exams, [facility]  Dietary issues and exercise activities discussed: Current Exercise Habits: Home exercise routine, Type of exercise: walking, Time (Minutes): 20, Frequency (Times/Week): 7, Weekly Exercise (Minutes/Week): 140   Goals Addressed             This Visit's Progress    Patient Stated       08/24/2022, wants to lose 15 pounds       Depression Screen    08/24/2022    8:19 AM 05/30/2022    9:50 AM 09/27/2021   11:45 AM 04/21/2021    3:13 PM 11/17/2020    2:38 PM 06/04/2019    9:38 AM 06/04/2019    9:18 AM  PHQ 2/9  Scores  PHQ - 2 Score 0 0 4 0 3 0 0  PHQ- 9 Score   _0 Fall Risk    08/24/2022    8:18 AM 08/23/2022    5:19 PM 05/30/2022    9:49 AM 06/04/2019    9:38 AM 06/04/2019    9:18 AM  Fall Risk   Falls in the past year? _1 Comment slipped on the ice      Number falls in past yr: 0 0 1 0 0  Injury with Fall? _2 0 0  Comment broke arm      Risk for fall due to : Medication side effect  History of fall(s) History of fall(s);Medication side effect   Follow up Falls evaluation completed;Education provided;Falls prevention discussed  Falls evaluation completed Falls evaluation completed;Education provided;Falls prevention discussed     FALL RISK PREVENTION PERTAINING TO THE HOME:  Any stairs in or around the home? Yes  If so, are there any without handrails? No  Home free of loose throw rugs in walkways, pet beds, electrical cords, etc? Yes  Adequate lighting in your home to reduce risk of falls? Yes   ASSISTIVE DEVICES UTILIZED TO PREVENT FALLS:  Life alert? No  Use of a cane, walker or w/c? No  Grab bars in the bathroom? No  Shower chair or bench in shower? No  Elevated toilet seat or a handicapped toilet? Yes   TIMED UP AND GO:  Was the test performed? No .      Cognitive Function:        08/24/2022    8:20 AM  6CIT Screen  What Year? 0 points  What month? 0 points  What time? 0 points  Count back from 20 0 points  Months in reverse 0 points  Repeat phrase 0 points  Total Score 0 points    Immunizations Immunization History  Administered Date(s) Administered   Influenza,inj,Quad PF,6+ Mos 06/04/2019, 05/30/2022   Influenza-Unspecified 08/09/2018, 06/08/2020   Moderna Sars-Covid-2 Vaccination 11/13/2019, 12/11/2019, 06/08/2020   Tdap 05/01/2021    TDAP status: Up to date  Flu Vaccine status: Up to date  Pneumococcal vaccine status: Up to date  Covid-19 vaccine status: Completed vaccines  Qualifies for Shingles Vaccine? Yes    Zostavax completed No   Shingrix Completed?: No.    Education has been provided regarding the importance of this vaccine. Patient has been advised to call insurance company to determine out of pocket expense if they have not yet received this vaccine. Advised may also receive vaccine at local pharmacy or Health Dept. Verbalized acceptance and understanding.  Screening Tests Health Maintenance  Topic Date Due   Zoster Vaccines- Shingrix (1 of 2) Never done   Medicare Annual Wellness (AWV)  06/03/2020   COVID-19 Vaccine (4 - 2023-24 season) 05/06/2022   HIV Screening  05/31/2023 (Originally 10/22/1980)   MAMMOGRAM  08/06/2023   COLONOSCOPY (Pts 45-66yr Insurance coverage will need to be confirmed)  03/12/2025   DTaP/Tdap/Td (2 - Td or Tdap) 05/02/2031   INFLUENZA VACCINE  Completed   Hepatitis C Screening  Completed   HPV VACCINES  Aged Out   PAP SMEAR-Modifier  Discontinued    Health Maintenance  Health Maintenance Due  Topic Date Due   Zoster Vaccines- Shingrix (1 of 2) Never done   Medicare Annual Wellness (AWV)  06/03/2020   COVID-19 Vaccine (4 - 2023-24 season) 05/06/2022    Colorectal cancer screening: Type of screening: Colonoscopy. Completed 03/13/2015. Repeat every 7 years  Mammogram status: Completed 08/05/2022. Repeat every year  Bone Density status: n/a  Lung Cancer Screening: (Low Dose CT Chest recommended if Age 911-80years, 30 pack-year currently smoking OR have quit w/in 15years.) does not qualify.   Lung Cancer Screening Referral: no  Additional Screening:  Hepatitis C Screening: does qualify; Completed 11/17/2020  Vision Screening: Recommended annual ophthalmology exams for early detection of glaucoma and other disorders of the eye. Is the patient up to date with their annual eye exam?  Yes  Who is the provider or what is the name of the office in which the patient attends annual eye exams? [facility] If pt is not established with a provider, would they  like to be referred to a provider to establish care? No .   Dental Screening: Recommended annual dental exams for proper oral hygiene  Community Resource Referral / Chronic Care Management: CRR required this visit?  No   CCM required this visit?  No      Plan:     I have personally reviewed and noted the following in the patient's chart:   Medical and social history Use of alcohol, tobacco or illicit drugs  Current medications and supplements including opioid prescriptions. Patient is not currently taking opioid prescriptions. Functional ability and status Nutritional status Physical activity Advanced directives List of other physicians Hospitalizations, surgeries, and  ER visits in previous 12 months Vitals Screenings to include cognitive, depression, and falls Referrals and appointments  In addition, I have reviewed and discussed with patient certain preventive protocols, quality metrics, and best practice recommendations. A written personalized care plan for preventive services as well as general preventive health recommendations were provided to patient.     Kellie Simmering, LPN   [id number]   Nurse Notes: none  Due to this being a virtual visit, the after visit summary with patients personalized plan was offered to patient via mail or my-chart. Patient would like to access on my-chart

## 2022-08-31 ENCOUNTER — Other Ambulatory Visit: Payer: Self-pay

## 2022-08-31 ENCOUNTER — Encounter: Payer: Self-pay | Admitting: Allergy & Immunology

## 2022-08-31 ENCOUNTER — Ambulatory Visit: Payer: Medicare Other | Admitting: Allergy & Immunology

## 2022-08-31 VITALS — BP 130/70 | HR 90 | Temp 97.9°F | Resp 20 | Ht 68.0 in | Wt 204.0 lb

## 2022-08-31 DIAGNOSIS — J4530 Mild persistent asthma, uncomplicated: Secondary | ICD-10-CM

## 2022-08-31 DIAGNOSIS — J310 Chronic rhinitis: Secondary | ICD-10-CM

## 2022-08-31 DIAGNOSIS — L239 Allergic contact dermatitis, unspecified cause: Secondary | ICD-10-CM

## 2022-08-31 MED ORDER — FLUTICASONE FUROATE-VILANTEROL 200-25 MCG/ACT IN AEPB: 1.0000 | INHALATION_SPRAY | Freq: Every day | RESPIRATORY_TRACT | 5 refills | Status: AC

## 2022-08-31 NOTE — Patient Instructions (Addendum)
1. Mild persistent asthma, uncomplicated - Lung testing looked slightly low, but it did get better with the albuterol treatment. - Because of this and your nighttime coughing, we are going to start you on a daily controller medication to control your asthma a bit more effectively.  - Daily controller medication(s): Breo 200/8mg one puff once daily - Prior to physical activity: albuterol 2 puffs 10-15 minutes before physical activity. - Rescue medications: albuterol 4 puffs every 4-6 hours as needed - Asthma control goals:  * Full participation in all desired activities (may need albuterol before activity) * Albuterol use two time or less a week on average (not counting use with activity) * Cough interfering with sleep two time or less a month * Oral steroids no more than once a year * No hospitalizations  2. Chronic rhinitis - Because of your insurance, we are going to have to skin test at the next visit.  - This will help to clarify things for you.  - Then we can come up with a better plan.   3. Allergic contact dermatitis - We will schedule you for patch testing look for chemical sensitivities.  - These are placed on your back on a Monday with stickers and then removed on Wednesday and read on the Wednesday and the Friday.  - This will help you to be able to avoid any triggering environmental triggers for your contact dermatitis.  4. Return in about 1 week (around 09/07/2022) for SKIN TESTING ([facility].    Please inform [place]of any Emergency Department visits, hospitalizations, or changes in symptoms. Call u[place]abefore going to the ED for breathing or allergy symptoms since we might be able to fit you in for a sick visit. Feel free to contact u[place]oreaanytime with any questions, problems, or concerns.  It was a pleasure to meet you today!  Websites that have reliable patient information: 1. American Academy of Asthma, Allergy, and Immunology: www.aaaai.org 2. Food Allergy Research and  Education (FARE): foodallergy.org 3. Mothers of Asthmatics: http://www.asthmacommunitynetwork.org 4. American College of Allergy, Asthma, and Immunology: www.acaai.org   COVID-19 Vaccine Information can be found at: [email or web address] questions related to vaccine distribution or appointments, please email [email or web address] or call [phone]   We realize that you might be concerned about having an allergic reaction to the COVID19 vaccines. To help with that concern, WE ARE OFFERING THE COVID19 VACCINES IN OUR OFFICE! Ask the front desk for dates!     "Like" u[place]on Facebook and Instagram for our latest updates!      A healthy democracy works best when ANew York Life Insuranceparticipate! Make sure you are registered to vote! If you have moved or changed any of your contact information, you will need to get this updated before voting!  In some cases, you MAY be able to register to vote online: [email or web address]

## 2022-08-31 NOTE — Progress Notes (Unsigned)
NEW PATIENT  Date of Service/Encounter:  08/31/22  Consult requested by: [name], FNP   Assessment:   Mild persistent asthma, uncomplicated   Chronic rhinitis  Allergic contact dermatitis  Inflammatory bowel disease  Disabled status - previously worked with Dr. Loanne Drilling and Dr. Dwyane Dee with Endocrinology  Favorite color of purple  Plan/Recommendations:   1. Mild persistent asthma, uncomplicated - Lung testing looked slightly low, but it did get better with the albuterol treatment. - Because of this and your nighttime coughing, we are going to start you on a daily controller medication to control your asthma a bit more effectively.  - Daily controller medication(s): Breo 200/21mg one puff once daily - Prior to physical activity: albuterol 2 puffs 10-15 minutes before physical activity. - Rescue medications: albuterol 4 puffs every 4-6 hours as needed - Asthma control goals:  * Full participation in all desired activities (may need albuterol before activity) * Albuterol use two time or less a week on average (not counting use with activity) * Cough interfering with sleep two time or less a month * Oral steroids no more than once a year * No hospitalizations  2. Chronic rhinitis - Because of your insurance, we are going to have to skin test at the next visit.  - This will help to clarify things for you.  - Then we can come up with a better plan.   3. Allergic contact dermatitis - We will schedule you for patch testing look for chemical sensitivities.  - These are placed on your back on a Monday with stickers and then removed on Wednesday and read on the Wednesday and the Friday.  - This will help you to be able to avoid any triggering environmental triggers for your contact dermatitis.  4. Return in about 1 week (around 09/07/2022) for SKIN [place]ING [place].    This note in its entirety was forwarded to the Provider who requested this  consultation.  Subjective:   Gabrielle FORGIONEis a 56y.o. female presenting today for evaluation of  Chief Complaint  Patient presents with   Other    Allergies     Gabrielle SCROGGINShas a history of the following: Patient Active Problem List   Diagnosis Date Noted   Right-sided low back pain with right-sided sciatica 05/06/2019   Right sided numbness 03/27/2019   Paresthesia 12/25/2018   Essential hypertension 06/26/2018   Abdominal pain, chronic, right lower quadrant 01/06/2015   Nausea and vomiting 01/05/2015   [place]cerative colitis [place] 01/05/2015   Enteritis    Nausea with vomiting    Intractable nausea and vomiting 04/29/2014   Benign paroxysmal positional vertigo 03/14/2013   Lymphedema of arm 12/30/2011    History obtained from: chart review and patient.  Gabrielle Poll[place]was [place]     KDaijanaeis a 56y.o. female presenting for an evaluation of allergies and asthma .   Asthma/Respiratory Symptom History: She does have a history of asthma. She has an albuterol inhaler that she uses intermittently at night.   Allergic Rhinitis Symptom History: She had seasonal allergies for a period of time. But over the last few years, she has had seasonal allergies. It seemed to get worse this year especially in the spring time. She had broken her arm and was incapacitated for a long period of time.  She reports that her eyes were itching extensively and she was itching and having rhinorrhea. She has tried using Benadryl  without improvement. She was on cetirizine. She cannot take Allegra because it breaks her eyes out. She has tried Claritin. She has a dog who has been with her for a long period of time. She does have a damp basement. She is not really using it aside from some storage. She is unsure what is going on there.   Food Allergy Symptom History: She seems to tolerate all of her food allergies without a problem. She does not think that foods are  related to this.   Skin Symptom History: She reports that she has a lot of whelts.  She hsa reacted to adhesive medical items. She does react to certain Bandaids (she wears the fabric ones without a problem, but the other ones definitely cause her to react.  She had to wear a heart monitor   She has had some large local reactions to stinging insects with urtciara, just around the injection site.   She is a breast cancer survivor. She thought that there was a lot of side effects from her chemotherapy. She thought that this might all be related. But it seems that everything stopped when she had her surgery and things just flared.   Otherwise, there is no history of other atopic diseases, including drug allergies, stinging insect allergies, eczema, urticaria, or contact dermatitis. There is no significant infectious history. Vaccinations are up to date.    Past Medical History: Patient Active Problem List   Diagnosis Date Noted   Right-sided low back pain with right-sided sciatica 05/06/2019   Right sided numbness 03/27/2019   Paresthesia 12/25/2018   Essential hypertension 06/26/2018   Abdominal pain, chronic, right lower quadrant 01/06/2015   Nausea and vomiting 01/05/2015   Ulcerative colitis ([place]) 01/05/2015   Enteritis    Nausea with vomiting    Intractable nausea and vomiting 04/29/2014   Benign paroxysmal positional vertigo 03/14/2013   Lymphedema of arm 12/30/2011    Medication List:  Allergies as of 08/31/2022       Reactions   Humira [adalimumab] Anaphylaxis   Ampicillin Hives, Itching   Codeine    Compazine [prochlorperazine Maleate] Other (See Comments)   "Stroke-like symptoms" Can tolerate Phenergan.   Cymbalta [duloxetine Hcl] Other (See Comments)   Fainting   Imitrex [sumatriptan Base] Hives, Nausea And Vomiting   Lyrica [pregabalin] Other (See Comments)   Fainting   Metoclopramide Hcl Hives   Percocet [oxycodone-acetaminophen] Itching   Effexor [venlafaxine  Hydrochloride] Hives, Palpitations   Fentanyl Nausea And Vomiting   Tape Itching, Rash        Medication List        Accurate as of August 31, 2022  4:25 PM. If you have any questions, ask your nurse or doctor.          albuterol 108 (90 Base) MCG/ACT inhaler Commonly known as: VENTOLIN HFA Inhale 2 puffs into the lungs every 6 (six) hours as needed for wheezing or shortness of breath.   amitriptyline 50 MG tablet Commonly known as: ELAVIL Take 1 tablet (50 mg total) by mouth at bedtime.   atorvastatin 20 MG tablet Commonly known as: LIPITOR Take 1 tablet (20 mg total) by mouth daily.   busPIRone 5 MG tablet Commonly known as: BUSPAR Take 1 tablet (5 mg total) by mouth at bedtime.   cetirizine 10 MG tablet Commonly known as: ZYRTEC Take 10 mg by mouth daily.   EPINEPHrine 0.3 mg/0.3 mL Soaj injection Commonly known as: EpiPen 2-Pak Inject 0.3 mg into the muscle  as needed for anaphylaxis.   eszopiclone 2 MG Tabs tablet Commonly known as: LUNESTA Take immediately before bedtime   fluticasone furoate-vilanterol 200-25 MCG/ACT Aepb Commonly known as: Breo Ellipta Inhale 1 puff into the lungs daily for 28 days. Started by: Valentina Shaggy, MD   folic acid 1 MG tablet Commonly known as: FOLVITE Take 3 mg by mouth daily.   methotrexate 1 g injection Commonly known as: 50 mg/ml Inject into the vein every Monday. 0.4m every Monday   olmesartan 20 MG tablet Commonly known as: BENICAR Take 1 tablet (20 mg total) by mouth daily.   omeprazole 40 MG capsule Commonly known as: PRILOSEC Take 1 capsule (40 mg total) by mouth daily.   OVER THE COUNTER MEDICATION Aleve cream for pain   polyethylene glycol 17 g packet Commonly known as: MIRALAX / GLYCOLAX Take 17 g by mouth daily as needed.   Simponi Aria 50 MG/4ML Soln injection Generic drug: golimumab 50 mg See admin instructions. Infusion every 8 weeks, [place] [facility]   tiZANidine 2 MG  tablet Commonly known as: ZANAFLEX Take 2 mg by mouth 3 (three) times daily.   Vitamin D (Ergocalciferol) 1.25 MG (50000 UNIT) Caps capsule Commonly known as: DRISDOL Take 1 capsule (50,000 Units total) by mouth 2 (two) times a week. Monday and Wednesday        Birth History: born at term without complications  Developmental History: non-contributory  Past Surgical History: Past Surgical History:  Procedure Laterality Date   ABDOMINAL HYSTERECTOMY  09/05/2005   TAH/BSO   ANTERIOR INTEROSSEOUS NERVE DECOMPRESSION Right 01/13/2022   Procedure: ANTERIOR INTEROSSEOUS NERVE DECOMPRESSION;  Surgeon: VHiram Gash MD;  [facility]  Service: [place]opedics;  Laterality: Right;   BREAST LUMPECTOMY     BREAST SURGERY  09/05/2004   LUMPECTOMY   CHOLECYSTECTOMY     CHOLECYSTECTOMY N/A 04/29/2014   Procedure: LAPAROSCOPIC CHOLECYSTECTOMY WITH INTRAOPERATIVE CHOLANGIOGRAM;  Surgeon: BEdward Jolly MD;  [facility]: [facility];  Service: General;  Laterality: N/A;   COLONOSCOPY  02/04/2012   Dr. HBenson Norway sessile serrated adenoma   COLONOSCOPY WITH PROPOFOL N/A 03/13/2015   Procedure: COLONOSCOPY WITH PROPOFOL;  Surgeon: PCarol Ada MD;  Location: [facility];  Service: Endoscopy;  Laterality: N/A;   DILATION AND CURETTAGE OF UTERUS     ELBOW LIGAMENT RECONSTRUCTION Right 11/15/2021   Procedure: ELBOW LIGAMENT REPAIR/ LCL;  Surgeon: VHiram Gash MD;  [place] [facility]  Service: Orthopedics;  Laterality: Right;  Regional block   FLEXIBLE SIGMOIDOSCOPY N/A 07/18/2014   Dr. HBenson Norway mild left-sided colitis, biopsies with quiescent UC   HARDWARE REMOVAL Right 01/13/2022   Procedure: HARDWARE REMOVAL/ELBOW;  Surgeon: VHiram Gash MD;  [place] [facility]  Service: Orthopedics;  Laterality: Right;   MYOMECTOMY     ORIF RADIAL FRACTURE Right 11/15/2021   Procedure: RIGHT RADIAL HEAD ARTHROPLASTY;  Surgeon: VHiram Gash MD;  [facility]  Service: Orthopedics;  Laterality: Right;  Regional block   ORIF ULNAR FRACTURE Right 11/15/2021   Procedure: OPEN REDUCTION INTERNAL FIXATION (ORIF) RIGHT ULNAR FRACTURE;  Surgeon: VHiram Gash MD;  [place] [facility]  Service: Orthopedics;  Laterality: Right;  Regional block   TONSILLECTOMY       Family History: Family History  Problem Relation Age of Onset   Breast cancer Mother 560  Heart failure Father    Heart disease Father    Cancer Maternal Grandmother  COLON   Heart failure Paternal Grandmother    Heart disease Paternal Grandmother      Social History: Epiphany lives at home with her dog.  She denies any husband is 41 years old.  There is hardwood throughout the home.  She has a heat pump for heating and cooling. There is no fume, chemical, or dust exposure.    Review of Systems  Constitutional: Negative.  Negative for chills, fever, malaise/fatigue and weight loss.  HENT:  Positive for congestion. Negative for ear discharge, ear pain and sinus pain.   Eyes:  Negative for pain, discharge and redness.  Respiratory:  Positive for cough and sputum production. Negative for shortness of breath and wheezing.   Cardiovascular: Negative.  Negative for chest pain and palpitations.  Gastrointestinal:  Negative for abdominal pain, constipation, diarrhea, heartburn, nausea and vomiting.  Skin: Negative.  Negative for itching and rash.  Neurological:  Negative for dizziness and headaches.  Endo/Heme/Allergies:  Positive for environmental allergies. Does not bruise/bleed easily.       Objective:   Blood pressure 130/70, pulse 90, temperature 97.9 F (36.6 C), resp. rate 20, height 5' 8"  (1.727 m), weight 204 lb (92.5 kg), SpO2 95 %. Body mass index is 31.02 kg/m.     Physical Exam Vitals reviewed.  Constitutional:      Appearance: She is well-developed.  HENT:     Head: Normocephalic and atraumatic.     Right Ear:  Tympanic membrane, ear canal and external ear normal. No drainage, swelling or tenderness. Tympanic membrane is not injected, scarred, erythematous, retracted or bulging.     Left Ear: Tympanic membrane, ear canal and external ear normal. No drainage, swelling or tenderness. Tympanic membrane is not injected, scarred, erythematous, retracted or bulging.     Nose: No nasal deformity, septal deviation, mucosal edema or rhinorrhea.     Right Turbinates: Enlarged, swollen and pale.     Left Turbinates: Enlarged, swollen and pale.     Right Sinus: No maxillary sinus tenderness or frontal sinus tenderness.     Left Sinus: No maxillary sinus tenderness or frontal sinus tenderness.     Mouth/Throat:     Mouth: Mucous membranes are not pale and not dry.     Pharynx: Uvula midline.  Eyes:     General: Lids are normal. Allergic shiner present.        Right eye: No discharge.        Left eye: No discharge.     Conjunctiva/sclera: Conjunctivae normal.     Right eye: Right conjunctiva is not injected. No chemosis.    Left eye: Left conjunctiva is not injected. No chemosis.    Pupils: Pupils are equal, round, and reactive to light.  Cardiovascular:     Rate and Rhythm: Normal rate and regular rhythm.     Heart sounds: Normal heart sounds.  Pulmonary:     Effort: Pulmonary effort is normal. No tachypnea, accessory muscle usage or respiratory distress.     Breath sounds: Normal breath sounds. No wheezing, rhonchi or rales.  Chest:     Chest wall: No tenderness.  Abdominal:     Tenderness: There is no abdominal tenderness. There is no guarding or rebound.  Lymphadenopathy:     Head:     Right side of head: No submandibular, tonsillar or occipital adenopathy.     Left side of head: No submandibular, tonsillar or occipital adenopathy.     Cervical: No cervical adenopathy.  Skin:  Coloration: Skin is not pale.     Findings: No abrasion, erythema, petechiae or rash. Rash is not papular, urticarial or  vesicular.  Neurological:     Mental Status: She is alert.  Psychiatric:        Behavior: Behavior is cooperative.      Diagnostic studies:    Spirometry: results abnormal (FEV1: 1.52/59%, FVC: 3.48/107%, FEV1/FVC: 44%).    Spirometry consistent with mixed obstructive and restrictive disease. Albuterol four puffs via MDI treatment given in clinic with improvement in FEV1, but not significant per ATS criteria.  Allergy Studies:  none      Salvatore Marvel, MD Allergy and [place] [place]

## 2022-09-01 ENCOUNTER — Encounter: Payer: Self-pay | Admitting: Allergy & Immunology

## 2022-09-07 ENCOUNTER — Other Ambulatory Visit: Payer: Self-pay

## 2022-09-07 ENCOUNTER — Ambulatory Visit (INDEPENDENT_AMBULATORY_CARE_PROVIDER_SITE_OTHER): Payer: Medicare Other | Admitting: Allergy & Immunology

## 2022-09-07 ENCOUNTER — Encounter: Payer: Self-pay | Admitting: Allergy & Immunology

## 2022-09-07 VITALS — BP 130/70 | HR 98 | Temp 98.2°F | Resp 20 | Ht 68.0 in | Wt 204.0 lb

## 2022-09-07 DIAGNOSIS — J4530 Mild persistent asthma, uncomplicated: Secondary | ICD-10-CM

## 2022-09-07 DIAGNOSIS — Z79899 Other long term (current) drug therapy: Secondary | ICD-10-CM | POA: Diagnosis not present

## 2022-09-07 DIAGNOSIS — M25512 Pain in left shoulder: Secondary | ICD-10-CM | POA: Diagnosis not present

## 2022-09-07 DIAGNOSIS — K5190 Ulcerative colitis, unspecified, without complications: Secondary | ICD-10-CM | POA: Diagnosis not present

## 2022-09-07 DIAGNOSIS — M6788 Other specified disorders of synovium and tendon, other site: Secondary | ICD-10-CM | POA: Diagnosis not present

## 2022-09-07 DIAGNOSIS — J302 Other seasonal allergic rhinitis: Secondary | ICD-10-CM

## 2022-09-07 DIAGNOSIS — M459 Ankylosing spondylitis of unspecified sites in spine: Secondary | ICD-10-CM | POA: Diagnosis not present

## 2022-09-07 DIAGNOSIS — J3489 Other specified disorders of nose and nasal sinuses: Secondary | ICD-10-CM | POA: Diagnosis not present

## 2022-09-07 DIAGNOSIS — J3089 Other allergic rhinitis: Secondary | ICD-10-CM

## 2022-09-07 DIAGNOSIS — L239 Allergic contact dermatitis, unspecified cause: Secondary | ICD-10-CM

## 2022-09-07 DIAGNOSIS — R21 Rash and other nonspecific skin eruption: Secondary | ICD-10-CM

## 2022-09-07 MED ORDER — RYALTRIS 665-25 MCG/ACT NA SUSP: 1.0000 | Freq: Two times a day (BID) | NASAL | 5 refills | Status: AC

## 2022-09-07 MED ORDER — LEVOCETIRIZINE DIHYDROCHLORIDE 5 MG PO TABS: 5.0000 mg | ORAL_TABLET | Freq: Every evening | ORAL | 5 refills | Status: DC

## 2022-09-07 NOTE — Progress Notes (Signed)
FOLLOW UP  Date of Service/Encounter:  09/07/22   Assessment:   Mild persistent asthma, uncomplicated    Chronic rhinitis (grasses, ragweed, weeds, trees, indoor molds, outdoor molds, dust mites, cat, and dog)   Allergic contact dermatitis - recommended patch testing    Inflammatory bowel disease   History of breast cancer    Disabled status - previously worked with Dr. Loanne Drilling and Dr. Dwyane Dee with Endocrinology   Favorite color of purple    Plan/Recommendations:   1. Mild persistent asthma, uncomplicated - Lung testing not done. - Continue with the Breo one puff once daily.  - Daily controller medication(s): Breo 200/41mg one puff once daily - Prior to physical activity: albuterol 2 puffs 10-15 minutes before physical activity. - Rescue medications: albuterol 4 puffs every 4-6 hours as needed - Asthma control goals:  * Full participation in all desired activities (may need albuterol before activity) * Albuterol use two time or less a week on average (not counting use with activity) * Cough interfering with sleep two time or less a month * Oral steroids no more than once a year * No hospitalizations  2. Chronic rhinitis - Testing today showed: grasses, ragweed, weeds, trees, indoor molds, outdoor molds, dust mites, cat, and dog. - Copy of test results provided.  - Avoidance measures provided. - Stop taking: all of your current medications - Start taking: Xyzal (levocetirizine) '5mg'$  tablet once daily and Ryaltris (olopatadine/mometasone) two sprays per nostril 1-2 times daily as needed - We will bring Ryaltris samples up and provide one for you on Friday. - Depending on your insurance, we may need to send in each one separately.  - You can use an extra dose of the antihistamine, if needed, for breakthrough symptoms.  - Consider nasal saline rinses 1-2 times daily to remove allergens from the nasal cavities as well as help with mucous clearance (this is especially  helpful to do before the nasal sprays are given) - Strongly consider allergy shots as a means of long-term control. - Allergy shots "re-train" and "reset" the immune system to ignore environmental allergens and decrease the resulting immune response to those allergens (sneezing, itchy watery eyes, runny nose, nasal congestion, etc).    - Allergy shots improve symptoms in 75-85% of patients.  - We can discuss more at the next appointment if the medications are not working for you.  3. Allergic contact dermatitis - We will schedule you for patch testing look for chemical sensitivities.  - These are placed on your back on a Monday with stickers and then removed on Wednesday and read on the Wednesday and the Friday.  - This will help you to be able to avoid any triggering environmental triggers for your contact dermatitis.  4. Follow up in 2-3 weeks for patch testing placement with placement on a Monday and readings on a Wednesday and Friday.   Subjective:   Gabrielle MAUCKis a 57y.o. female presenting today for follow up of  Chief Complaint  Patient presents with   Allergy Testing    Allergy testing     KKISSIE ZIOLKOWSKIhas a history of the following: Patient Active Problem List   Diagnosis Date Noted   Right-sided low back pain with right-sided sciatica 05/06/2019   Right sided numbness 03/27/2019   Paresthesia 12/25/2018   Essential hypertension 06/26/2018   Abdominal pain, chronic, right lower quadrant 01/06/2015   Nausea and vomiting 01/05/2015   [place]ative [place] ([facility] 01/05/2015   Enteritis  Nausea with vomiting    Intractable nausea and vomiting 04/29/2014   Benign paroxysmal positional vertigo 03/14/2013   Lymphedema of arm 12/30/2011    History obtained from: chart review and patient.  Gabrielle Hawkins is a 57 y.o. female presenting for skin testing.  We saw her last week and your insurance, we cannot do testing at the same visit.  She presents today for  environmental allergy testing in food testing.  Otherwise, there have been no changes to her past medical history, surgical history, family history, or social history.     Objective:   Blood pressure 130/70, pulse 98, temperature 98.2 F (36.8 C), resp. rate 20, height '5\' 8"'$  (1.727 m), weight 204 lb (92.5 kg), SpO2 97 %. Body mass index is 31.02 kg/m.   Physical exam: deferred since this was a skin testing visit only   Diagnostic studies:    Allergy Studies:     Airborne Adult Perc - 09/07/22 1423     Time Antigen Placed 1423    Allergen Manufacturer Lavella Hammock    Location Back    Number of Test 59    1. Control-Buffer 50% Glycerol Negative    2. Control-Histamine 1 mg/ml 2+    3. Albumin saline Negative    4. [place] 3+    5. [place] 3+    6. [place] 3+    7. [place] Blue 2+    8. Meadow Fescue 3+    9. Perennial Rye 4+    10. Sweet Vernal 3+    11. Timothy 3+    12. Cocklebur 2+    13. Burweed Marshelder 2+    14. Ragweed, short 4+    15. Ragweed, Giant 3+    16. Plantain,  English 2+    17. Lamb's Quarters Negative    18. Sheep Sorrell Negative    19. Rough Pigweed Negative    20. Marsh Elder, Rough Negative    21. Mugwort, Common Negative    22. Ash mix Negative    23. Birch mix Negative    24. Beech American Negative    25. Box, Elder Negative    26. Cedar, red Negative    27. Cottonwood, [place] Negative    28. Elm mix Negative    29. Hickory Negative    30. Maple mix Negative    31. Oak, [place] mix 2+    32. Pecan Pollen 2+    33. Pine mix Negative    34. Sycamore Eastern 2+    35. Godley, Black Pollen 2+    36. Alternaria alternata Negative    37. Cladosporium Herbarum Negative    38. Aspergillus mix Negative    39. Penicillium mix Negative    40. Bipolaris sorokiniana (Helminthosporium) Negative    41. Drechslera spicifera (Curvularia) 2+    42. Mucor plumbeus Negative    43. Fusarium moniliforme 2+    44. Aureobasidium pullulans (pullulara)  Negative    45. Rhizopus oryzae 2+    46. Botrytis cinera Negative    47. Epicoccum nigrum Negative    48. Phoma betae Negative    49. Candida Albicans Negative    50. Trichophyton mentagrophytes Negative    51. Mite, D Farinae  5,000 AU/ml Negative    52. Mite, D Pteronyssinus  5,000 AU/ml Negative    53. Cat Hair 10,000 BAU/ml Negative    54.  Dog Epithelia Negative    55. Mixed Feathers Negative    56. Horse Epithelia Negative  57. Cockroach, German Negative    58. Mouse Negative    59. Tobacco Leaf Negative             Intradermal - 09/07/22 1435     Time Antigen Placed 1445    Allergen Manufacturer Lavella Hammock    Location Arm    Number of Test 7    Control Negative    Mold 1 3+    Mold 2 3+    Cat 3+    Dog 3+    Cockroach Negative    Mite mix 4+             Food Adult Perc - 09/07/22 1400     Time Antigen Placed 1423    Allergen Manufacturer Lavella Hammock    Location Back    Number of allergen test 17    1. Peanut Negative    2. Soybean Negative    3. Wheat Negative    4. Sesame Negative    5. Milk, cow Negative    6. Egg White, Chicken Negative    7. Casein Negative    8. Shellfish Mix Negative    9. Fish Mix Negative    10. Cashew Negative    11. Pecan Food Negative    12. [place] Negative    13. Almond Negative    14. Hazelnut Negative    15. [place] nut Negative    16. Coconut Negative    17. Pistachio Negative             Allergy testing results were read and interpreted by myself, documented by clinical staff.      Salvatore Marvel, MD  Allergy and [facility]

## 2022-09-07 NOTE — Patient Instructions (Signed)
1. Mild persistent asthma, uncomplicated - Lung testing not done. - Continue with the Breo one puff once daily.  - Daily controller medication(s): Breo 200/24mg one puff once daily - Prior to physical activity: albuterol 2 puffs 10-15 minutes before physical activity. - Rescue medications: albuterol 4 puffs every 4-6 hours as needed - Asthma control goals:  * Full participation in all desired activities (may need albuterol before activity) * Albuterol use two time or less a week on average (not counting use with activity) * Cough interfering with sleep two time or less a month * Oral steroids no more than once a year * No hospitalizations  2. Chronic rhinitis - Testing today showed: grasses, ragweed, weeds, trees, indoor molds, outdoor molds, dust mites, cat, and dog. - Copy of test results provided.  - Avoidance measures provided. - Stop taking: all of your current medications - Start taking: Xyzal (levocetirizine) '5mg'$  tablet once daily and Ryaltris (olopatadine/mometasone) two sprays per nostril 1-2 times daily as needed - We will bring Ryaltris samples up and provide one for you on Friday. - Depending on your insurance, we may need to send in each one separately.  - You can use an extra dose of the antihistamine, if needed, for breakthrough symptoms.  - Consider nasal saline rinses 1-2 times daily to remove allergens from the nasal cavities as well as help with mucous clearance (this is especially helpful to do before the nasal sprays are given) - Strongly consider allergy shots as a means of long-term control. - Allergy shots "re-train" and "reset" the immune system to ignore environmental allergens and decrease the resulting immune response to those allergens (sneezing, itchy watery eyes, runny nose, nasal congestion, etc).    - Allergy shots improve symptoms in 75-85% of patients.  - We can discuss more at the next appointment if the medications are not working for you.  3.  Allergic contact dermatitis - We will schedule you for patch testing look for chemical sensitivities.  - These are placed on your back on a Monday with stickers and then removed on Wednesday and read on the Wednesday and the Friday.  - This will help you to be able to avoid any triggering environmental triggers for your contact dermatitis.  4. Follow up in 2-3 weeks for patch testing placement with placement on a Monday and readings on a Wednesday and Friday.    Please inform [place] any Emergency Department visits, hospitalizations, or changes in symptoms. Call [place] going to the ED for breathing or allergy symptoms since we might be able to fit you in for a sick visit. Feel free to contact u[place]anytime with any questions, problems, or concerns.  It was a pleasure to see you today!  Websites that have reliable patient information: 1. American Academy of Asthma, Allergy, and Immunology: www.aaaai.org 2. Food Allergy Research and Education (FARE): foodallergy.org 3. Mothers of Asthmatics: http://www.asthmacommunitynetwork.org 4. American College of Allergy, Asthma, and Immunology: www.acaai.org   COVID-19 Vaccine Information can be found at: [email or web address] questions related to vaccine distribution or appointments, please email [email or web address] or call [phone]   We realize that you might be concerned about having an allergic reaction to the COVID19 vaccines. To help with that concern, WE ARE OFFERING THE COVID19 VACCINES IN OUR OFFICE! Ask the front desk for dates!     "Like" u[place] Facebook and Instagram for our latest updates!      A healthy democracy works best when ANew York Life Insuranceparticipate! Make sure  you are registered to vote! If you have moved or changed any of your contact information, you will need to get this updated before voting!  In some cases, you MAY be able to register to vote online:  [email or web address]        Airborne Adult Perc - 09/07/22 1423     Time Antigen Placed 1423    Allergen Manufacturer Lavella Hammock    Location Back    Number of Test 59    1. Control-Buffer 50% Glycerol Negative    2. Control-Histamine 1 mg/ml 2+    3. Albumin saline Negative    4. [place] 3+    5. [place] 3+    6. Johnson 3+    7. [place] Blue 2+    8. Meadow Fescue 3+    9. Perennial Rye 4+    10. Sweet Vernal 3+    11. Timothy 3+    12. Cocklebur 2+    13. Burweed Marshelder 2+    14. Ragweed, short 4+    15. Ragweed, Giant 3+    16. Plantain,  English 2+    17. Lamb's Quarters Negative    18. Sheep Sorrell Negative    19. Rough Pigweed Negative    20. Marsh Elder, Rough Negative    21. Mugwort, Common Negative    22. Ash mix Negative    23. Birch mix Negative    24. Beech American Negative    25. Box, Elder Negative    26. Cedar, red Negative    27. Cottonwood, [place] Negative    28. Elm mix Negative    29. Hickory Negative    30. Maple mix Negative    31. Oak, [place] mix 2+    32. Pecan Pollen 2+    33. Pine mix Negative    34. Sycamore Eastern 2+    35. [place], Black Pollen 2+    36. Alternaria alternata Negative    37. Cladosporium Herbarum Negative    38. Aspergillus mix Negative    39. Penicillium mix Negative    40. Bipolaris sorokiniana (Helminthosporium) Negative    41. Drechslera spicifera (Curvularia) 2+    42. Mucor plumbeus Negative    43. Fusarium moniliforme 2+    44. Aureobasidium pullulans (pullulara) Negative    45. Rhizopus oryzae 2+    46. Botrytis cinera Negative    47. Epicoccum nigrum Negative    48. Phoma betae Negative    49. Candida Albicans Negative    50. Trichophyton mentagrophytes Negative    51. Mite, D Farinae  5,000 AU/ml Negative    52. Mite, D Pteronyssinus  5,000 AU/ml Negative    53. Cat Hair 10,000 BAU/ml Negative    54.  Dog Epithelia Negative    55. Mixed Feathers Negative    56. Horse  Epithelia Negative    57. Cockroach, German Negative    58. Mouse Negative    59. Tobacco Leaf Negative             Intradermal - 09/07/22 1435     Time Antigen Placed 1445    Allergen Manufacturer Lavella Hammock    Location Arm    Number of Test 7    Control Negative    Mold 1 3+    Mold 2 3+    Cat 3+    Dog 3+    Cockroach Negative    Mite mix 4+             Food Adult  Perc - 09/07/22 1400     Time Antigen Placed 1423    Allergen Manufacturer Lavella Hammock    Location Back    Number of allergen test 17    1. Peanut Negative    2. Soybean Negative    3. Wheat Negative    4. Sesame Negative    5. Milk, cow Negative    6. Egg White, Chicken Negative    7. Casein Negative    8. Shellfish Mix Negative    9. Fish Mix Negative    10. Cashew Negative    11. Pecan Food Negative    12. [place] Negative    13. Almond Negative    14. Hazelnut Negative    15. [place] nut Negative    16. Coconut Negative    17. Pistachio Negative             Reducing Pollen Exposure  The American Academy of Allergy, Asthma and Immunology suggests the following steps to reduce your exposure to pollen during allergy seasons.    Do not hang sheets or clothing out to dry; pollen may collect on these items. Do not mow lawns or spend time around freshly cut grass; mowing stirs up pollen. Keep windows closed at night.  Keep car windows closed while driving. Minimize morning activities outdoors, a time when pollen counts are usually at their highest. Stay indoors as much as possible when pollen counts or humidity is high and on windy days when pollen tends to remain in the air longer. Use air conditioning when possible.  Many air conditioners have filters that trap the pollen spores. Use a HEPA room air filter to remove pollen form the indoor air you breathe.  Control of Mold Allergen   Mold and fungi can grow on a variety of surfaces provided certain temperature and moisture conditions exist.   Outdoor molds grow on plants, decaying vegetation and soil.  The major outdoor mold, Alternaria and Cladosporium, are found in very high numbers during hot and dry conditions.  Generally, a late Summer - Fall peak is seen for common outdoor fungal spores.  Rain will temporarily lower outdoor mold spore count, but counts rise rapidly when the rainy period ends.  The most important indoor molds are Aspergillus and Penicillium.  Dark, humid and poorly ventilated basements are ideal sites for mold growth.  The next most common sites of mold growth are the bathroom and the kitchen.  Outdoor (Seasonal) Mold Control  Positive outdoor molds via skin testing: Alternaria and Cladosporium  Use air conditioning and keep windows closed Avoid exposure to decaying vegetation. Avoid leaf raking. Avoid grain handling. Consider wearing a face mask if working in moldy areas.    Indoor (Perennial) Mold Control   Positive indoor molds via skin testing: Aspergillus and Penicillium  Maintain humidity below 50%. Clean washable surfaces with 5% bleach solution. Remove sources e.g. contaminated carpets.     Control of Dust Mite Allergen    Dust mites play a major role in allergic asthma and rhinitis.  They occur in environments with high humidity wherever human skin is found.  Dust mites absorb humidity from the atmosphere (ie, they do not drink) and feed on organic matter (including shed human and animal skin).  Dust mites are a microscopic type of insect that you cannot see with the naked eye.  High levels of dust mites have been detected from mattresses, pillows, carpets, upholstered furniture, bed covers, clothes, soft toys and any woven material.  The principal allergen of the dust mite is found in its feces.  A gram of dust may contain 1,000 mites and 250,000 fecal particles.  Mite antigen is easily measured in the air during house cleaning activities.  Dust mites do not bite and do not cause harm to  humans, other than by triggering allergies/asthma.    Ways to decrease your exposure to dust mites in your home:  Encase mattresses, box springs and pillows with a mite-impermeable barrier or cover   Wash sheets, blankets and drapes weekly in hot water (130 F) with detergent and dry them in a dryer on the hot setting.  Have the room cleaned frequently with a vacuum cleaner and a damp dust-mop.  For carpeting or rugs, vacuuming with a vacuum cleaner equipped with a high-efficiency particulate air (HEPA) filter.  The dust mite allergic individual should not be in a room which is being cleaned and should wait 1 hour after cleaning before going into the room. Do not sleep on upholstered furniture (eg, couches).   If possible removing carpeting, upholstered furniture and drapery from the home is ideal.  Horizontal blinds should be eliminated in the rooms where the person spends the most time (bedroom, study, television room).  Washable vinyl, roller-type shades are optimal. Remove all non-washable stuffed toys from the bedroom.  Wash stuffed toys weekly like sheets and blankets above.   Reduce indoor humidity to less than 50%.  Inexpensive humidity monitors can be purchased at most hardware stores.  Do not use a humidifier as can make the problem worse and are not recommended.   Control of Dog or Cat Allergen  Avoidance is the best way to manage a dog or cat allergy. If you have a dog or cat and are allergic to dog or cats, consider removing the dog or cat from the home. If you have a dog or cat but don't want to find it a new home, or if your family wants a pet even though someone in the household is allergic, here are some strategies that may help keep symptoms at bay:  Keep the pet out of your bedroom and restrict it to only a few rooms. Be advised that keeping the dog or cat in only one room will not limit the allergens to that room. Don't pet, hug or kiss the dog or cat; if you do, wash your hands  with soap and water. High-efficiency particulate air (HEPA) cleaners run continuously in a bedroom or living room can reduce allergen levels over time. Regular use of a high-efficiency vacuum cleaner or a central vacuum can reduce allergen levels. Giving your dog or cat a bath at least once a week can reduce airborne allergen.   Allergy Shots   Allergies are the result of a chain reaction that starts in the immune system. Your immune system controls how your body defends itself. For instance, if you have an allergy to pollen, your immune system identifies pollen as an invader or allergen. Your immune system overreacts by producing antibodies called Immunoglobulin E (IgE). These antibodies travel to cells that release chemicals, causing an allergic reaction.  The concept behind allergy immunotherapy, whether it is received in the form of shots or tablets, is that the immune system can be desensitized to specific allergens that trigger allergy symptoms. Although it requires time and patience, the payback can be long-term relief.  How Do Allergy Shots Work?  Allergy shots work much like a vaccine. Your body responds to injected amounts  of a particular allergen given in increasing doses, eventually developing a resistance and tolerance to it. Allergy shots can lead to decreased, minimal or no allergy symptoms.  There generally are two phases: build-up and maintenance. Build-up often ranges from three to six months and involves receiving injections with increasing amounts of the allergens. The shots are typically given once or twice a week, though more rapid build-up schedules are sometimes used.  The maintenance phase begins when the most effective dose is reached. This dose is different for each person, depending on how allergic you are and your response to the build-up injections. Once the maintenance dose is reached, there are longer periods between injections, typically two to four  weeks.  Occasionally doctors give cortisone-type shots that can temporarily reduce allergy symptoms. These types of shots are different and should not be confused with allergy immunotherapy shots.  Who Can Be Treated with Allergy Shots?  Allergy shots may be a good treatment approach for people with allergic rhinitis (hay fever), allergic asthma, conjunctivitis (eye allergy) or stinging insect allergy.   Before deciding to begin allergy shots, you should consider:   The length of allergy season and the severity of your symptoms  Whether medications and/or changes to your environment can control your symptoms  Your desire to avoid long-term medication use  Time: allergy immunotherapy requires a major time commitment  Cost: may vary depending on your insurance coverage  Allergy shots for children age 57 and older are effective and often well tolerated. They might prevent the onset of new allergen sensitivities or the progression to asthma.  Allergy shots are not started on patients who are pregnant but can be continued on patients who become pregnant while receiving them. In some patients with other medical conditions or who take certain common medications, allergy shots may be of risk. It is important to mention other medications you talk to your allergist.   When Will I Feel Better?  Some may experience decreased allergy symptoms during the build-up phase. For others, it may take as long as 12 months on the maintenance dose. If there is no improvement after a year of maintenance, your allergist will discuss other treatment options with you.  If you aren't responding to allergy shots, it may be because there is not enough dose of the allergen in your vaccine or there are missing allergens that were not identified during your allergy testing. Other reasons could be that there are high levels of the allergen in your environment or major exposure to non-allergic triggers like tobacco  smoke.  What Is the Length of Treatment?  Once the maintenance dose is reached, allergy shots are generally continued for three to five years. The decision to stop should be discussed with your allergist at that time. Some people may experience a permanent reduction of allergy symptoms. Others may relapse and a longer course of allergy shots can be considered.  What Are the Possible Reactions?  The two types of adverse reactions that can occur with allergy shots are local and systemic. Common local reactions include very mild redness and swelling at the injection site, which can happen immediately or several hours after. A systemic reaction, which is less common, affects the entire body or a particular body system. They are usually mild and typically respond quickly to medications. Signs include increased allergy symptoms such as sneezing, a stuffy nose or hives.  Rarely, a serious systemic reaction called anaphylaxis can develop. Symptoms include swelling in the throat, wheezing, a  feeling of tightness in the chest, nausea or dizziness. Most serious systemic reactions develop within 30 minutes of allergy shots. This is why it is strongly recommended you wait in your doctor's office for 30 minutes after your injections. Your allergist is trained to watch for reactions, and his or her staff is trained and equipped with the proper medications to identify and treat them.  Who Should Administer Allergy Shots?  The preferred location for receiving shots is your prescribing allergist's office. Injections can sometimes be given at another facility where the physician and staff are trained to recognize and treat reactions, and have received instructions by your prescribing allergist.

## 2022-09-09 ENCOUNTER — Encounter: Payer: Self-pay | Admitting: Allergy & Immunology

## 2022-09-12 ENCOUNTER — Ambulatory Visit: Payer: Medicare Other | Admitting: Internal Medicine

## 2022-09-26 ENCOUNTER — Ambulatory Visit: Payer: 59 | Admitting: Internal Medicine

## 2022-09-26 ENCOUNTER — Telehealth: Payer: Self-pay

## 2022-09-26 VITALS — BP 128/86 | HR 92 | Temp 97.7°F | Resp 16 | Ht 68.0 in | Wt 200.0 lb

## 2022-09-26 DIAGNOSIS — L2389 Allergic contact dermatitis due to other agents: Secondary | ICD-10-CM | POA: Diagnosis not present

## 2022-09-26 NOTE — Progress Notes (Signed)
    Follow-up Note  RE: Gabrielle Hawkins MRN: [id number] DOB: 1966-05-11 Date of Office Visit: 09/26/2022  Primary care provider: [name], [place] Referring provider: [name], FNP   Gabrielle Hawkins returns to the office today for the patch test placement, given suspected history of contact dermatitis.    Diagnostics: True Test patches placed.    Plan:   Allergic contact dermatitis - Instructions provided on care of the patches for the next 48 hours. Gabrielle Hawkins was instructed to avoid showering for the next 48 hours. Gabrielle Hawkins will follow up in 48 hours and 96 hours for patch readings.    Gabrielle Flor MD [facility] [place]

## 2022-09-26 NOTE — Telephone Encounter (Signed)
Patient brought back allergy shot paperwork signed, but is not sure of how many shots or the vial set she is suppose to have for her allergies. Please advise.

## 2022-09-27 ENCOUNTER — Other Ambulatory Visit: Payer: Self-pay | Admitting: Allergy & Immunology

## 2022-09-27 DIAGNOSIS — J302 Other seasonal allergic rhinitis: Secondary | ICD-10-CM

## 2022-09-27 NOTE — Telephone Encounter (Signed)
I informed Marcie Bal and I will call the patient today to schedule her allergy shot appointment.

## 2022-09-27 NOTE — Telephone Encounter (Signed)
She needs two vials.

## 2022-09-27 NOTE — Progress Notes (Unsigned)
Prescription written.   Salvatore Marvel, MD [facility]

## 2022-09-28 ENCOUNTER — Encounter: Payer: Self-pay | Admitting: Family Medicine

## 2022-09-28 ENCOUNTER — Ambulatory Visit: Payer: 59 | Admitting: Family Medicine

## 2022-09-28 VITALS — Temp 98.0°F

## 2022-09-28 DIAGNOSIS — L235 Allergic contact dermatitis due to other chemical products: Secondary | ICD-10-CM | POA: Insufficient documentation

## 2022-09-28 NOTE — Progress Notes (Signed)
Aeroallergen Immunotherapy   Ordering Provider: Dr. Salvatore Marvel   Patient Details  Name: Gabrielle Hawkins  MRN: [id number]  Date of Birth: 1966-08-13   Order 2 of 2   Vial Label: RW/Molds   0.3 ml (Volume)  1:20 Concentration -- Ragweed Mix  0.2 ml (Volume)  1:20 Concentration -- Alternaria alternata  0.2 ml (Volume)  1:20 Concentration -- Cladosporium herbarum  0.2 ml (Volume)  1:10 Concentration -- Aspergillus mix  0.2 ml (Volume)  1:10 Concentration -- Penicillium mix  0.2 ml (Volume)  1:20 Concentration -- Drechslera spicifera  0.2 ml (Volume)  1:10 Concentration -- Fusarium moniliforme  0.2 ml (Volume)  1:10 Concentration -- Rhizopus oryzae    1.7  ml Extract Subtotal  3.3  ml Diluent  5.0  ml Maintenance Total   Schedule:  B   Blue Vial (1:100,000): Schedule B (6 doses)  Yellow Vial (1:10,000): Schedule B (6 doses)  Green Vial (1:1,000): Schedule B (6 doses)  Red Vial (1:100): Schedule A (14 doses)   Special Instructions: none

## 2022-09-28 NOTE — Patient Instructions (Signed)
Diagnostics:   TRUE TEST 48-hour hour reading: all negative   Plan:   Allergic contact dermatitis Patch testing was negative at today's visit.   Call the clinic if this treatment plan is not working well for you  Follow up in 2 days or sooner if needed.

## 2022-09-28 NOTE — Progress Notes (Signed)
    Follow-up Note  RE: Gabrielle Hawkins MRN: [id number] DOB: [birth year] Date of Office Visit: 09/28/2022  Primary care provider: [name], [place] Referring provider: [name], FNP   [name] returns to the office today for the initial patch test interpretation, given suspected history of contact dermatitis.    Diagnostics:   TRUE TEST 48-hour hour reading: all negative   Plan:   Allergic contact dermatitis Patch testing was negative at today's visit.   Call the clinic if this treatment plan is not working well for you  Follow up in 2 days or sooner if needed.   Thank you for the opportunity to care for this patient.  Please do not hesitate to contact me with questions.  Gareth Morgan, FNP [facility]

## 2022-09-28 NOTE — Progress Notes (Signed)
VIALS EXP 09-29-23

## 2022-09-28 NOTE — Progress Notes (Signed)
Aeroallergen Immunotherapy   Ordering Provider: Dr. Salvatore Marvel   Patient Details  Name: Gabrielle Hawkins  MRN: [id number]  Date of Birth: 06/17/1966   Order 1 of 2   Vial Label: G/W/T/C/D   0.3 ml (Volume)  BAU Concentration -- 7 Grass Mix* 100,000 ([address] [place], [place], [place], [place] Rye, [place], Sweet Vernal, Timothy)  0.2 ml (Volume)  1:20 Concentration -- [place]  0.3 ml (Volume)  BAU Concentration -- [place] 10,000  0.2 ml (Volume)  1:20 Concentration -- Johnson  0.2 ml (Volume)  1:20 Concentration -- Cocklebur  0.2 ml (Volume)  1:20 Concentration -- Burweed Marshelder  0.2 ml (Volume)  1:10 Concentration -- Plantain English  0.3 ml (Volume)  1:10 Concentration -- Oak, [place] mix*  0.2 ml (Volume)  1:10 Concentration -- Pecan Pollen  0.2 ml (Volume)  1:10 Concentration -- Sycamore Eastern*  0.2 ml (Volume)  1:20 Concentration -- Walnut, Black Pollen  0.7 ml (Volume)  1:10 Concentration -- Cat Hair  0.7 ml (Volume)  1:10 Concentration -- Dog Epithelia    3.9  ml Extract Subtotal  1.1  ml Diluent  5.0  ml Maintenance Total   Schedule:  B   Blue Vial (1:100,000): Schedule B (6 doses)  Yellow Vial (1:10,000): Schedule B (6 doses)  Green Vial (1:1,000): Schedule B (6 doses)  Red Vial (1:100): Schedule A (14 doses)   Special Instructions: none

## 2022-09-29 DIAGNOSIS — J3081 Allergic rhinitis due to animal (cat) (dog) hair and dander: Secondary | ICD-10-CM | POA: Diagnosis not present

## 2022-09-30 ENCOUNTER — Ambulatory Visit: Payer: 59 | Admitting: Allergy & Immunology

## 2022-09-30 VITALS — Temp 97.9°F

## 2022-09-30 DIAGNOSIS — L235 Allergic contact dermatitis due to other chemical products: Secondary | ICD-10-CM

## 2022-09-30 DIAGNOSIS — J301 Allergic rhinitis due to pollen: Secondary | ICD-10-CM

## 2022-09-30 NOTE — Progress Notes (Unsigned)
    Follow-up Note  RE: Gabrielle Hawkins MRN: [id number] DOB: 05-Oct-1965 Date of Office Visit: 09/30/2022  Primary care provider: [name], [place] Referring provider: [name], FNP   Gabrielle Hawkins returns to the office today for the final patch test interpretation, given suspected history of contact dermatitis.    Diagnostics:   TRUE TEST 96-hour reading:  +/- reaction to #23 (Thiomersal). She also had continued irritation from the adhesives from the patches themselves.    Plan:   Allergic contact dermatitis - The patient has been provided detailed information regarding the substances she is sensitive to, as well as products containing the substances.   - Meticulous avoidance of these substances is recommended.  - If avoidance is not possible, the use of barrier creams or lotions is recommended. - If symptoms persist or progress despite meticulous avoidance of Thimerosal, Dermatology referral may be warranted.  - She prefers to avoid repeat trips here since it is nearly one hour each way, therefore I recommended that she take pictures over the weekend of the areas.    Allergic rhinitis - Information on rush immunotherapy provided today.  - She will talk to her insurance company about this and call us when she makes a decision. - This would save her several trips.    Salvatore Marvel, MD  [facility] [place] [place]

## 2022-09-30 NOTE — Patient Instructions (Addendum)
1. 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 - Take pictures on MONDAY and MyChart them to [place] so we cna see if something else is reacting.  - Avoidance measures for thimerosal provided.   2. Return in about 4 months (around 01/29/2023).   You are also starting shots next month on 10/26/22!    Please inform us of any Emergency Department visits, hospitalizations, or changes in symptoms. Call us before going to the ED for breathing or allergy symptoms since we might be able to fit you in for a sick visit. Feel free to contact us anytime with any questions, problems, or concerns.  It was a pleasure to see you today!  Websites that have reliable patient information: 1. American Academy of Asthma, Allergy, and Immunology: www.aaaai.org 2. Food Allergy Research and Education (FARE): foodallergy.org 3. Mothers of Asthmatics: http://www.asthmacommunitynetwork.org 4. American College of Allergy, Asthma, and Immunology: www.acaai.org   COVID-19 Vaccine Information can be found at: ShippingScam.co.uk For questions related to vaccine distribution or appointments, please email [email or web address] .[email or web address] or call [phone].   We realize that you might be concerned about having an allergic reaction to the COVID19 vaccines. To help with that concern, WE ARE OFFERING THE COVID19 VACCINES IN OUR OFFICE! Ask the front desk for dates!     "Like" [place] on Facebook and Instagram for our latest updates!      A healthy democracy works best when [employer] participate! Make sure you are registered to vote! If you have moved or changed any of your contact information, you will need to get this updated before voting!  In some cases, you MAY be able to register to vote online: [email or web address]

## 2022-10-03 ENCOUNTER — Encounter: Payer: Self-pay | Admitting: Allergy & Immunology

## 2022-10-04 ENCOUNTER — Encounter: Payer: Self-pay | Admitting: Allergy & Immunology

## 2022-10-04 NOTE — Telephone Encounter (Signed)
She lives 45 minutes there and back and did not want to travel again for a final reading. She had very diffuse reactions to the adhesive from the patches themselves.   Salvatore Marvel, MD [facility] [place] [facility] [place]

## 2022-10-10 ENCOUNTER — Ambulatory Visit: Payer: Medicare Other | Admitting: Nurse Practitioner

## 2022-10-10 ENCOUNTER — Other Ambulatory Visit: Payer: Self-pay

## 2022-10-10 DIAGNOSIS — K5190 Ulcerative colitis, unspecified, without complications: Secondary | ICD-10-CM | POA: Diagnosis not present

## 2022-10-10 DIAGNOSIS — R002 Palpitations: Secondary | ICD-10-CM

## 2022-10-10 DIAGNOSIS — I10 Essential (primary) hypertension: Secondary | ICD-10-CM

## 2022-10-10 DIAGNOSIS — M459 Ankylosing spondylitis of unspecified sites in spine: Secondary | ICD-10-CM | POA: Diagnosis not present

## 2022-10-11 ENCOUNTER — Telehealth: Payer: Self-pay | Admitting: Nurse Practitioner

## 2022-10-11 LAB — LIPID PANEL
Chol/HDL Ratio: 2.1 ratio (ref 0.0–4.4)
Cholesterol, Total: 150 mg/dL (ref 100–199)
HDL: 71 mg/dL (ref >39)
LDL Chol Calc (NIH): 68 mg/dL (ref 0–99)
Triglycerides: 51 mg/dL (ref 0–149)
VLDL Cholesterol Cal: 11 mg/dL (ref 5–40)

## 2022-10-11 NOTE — Telephone Encounter (Signed)
Called pt to reschedule canceled appt no answer left vm

## 2022-10-26 ENCOUNTER — Ambulatory Visit (INDEPENDENT_AMBULATORY_CARE_PROVIDER_SITE_OTHER): Payer: 59

## 2022-10-26 DIAGNOSIS — J309 Allergic rhinitis, unspecified: Secondary | ICD-10-CM | POA: Diagnosis not present

## 2022-10-26 NOTE — Progress Notes (Signed)
Immunotherapy   Patient Details  Name: [name] MRN: [id number] Date of Birth: 1966/05/30  10/26/2022  Gabrielle Hawkins started injections for  ragweed's, molds, grasses, weeds, trees, cats, and dogs.  Following schedule: B  Frequency:1 time per week Epi-Pen:Epi-Pen Available  Consent signed in office today and patient instructions given. Patient waited in the lobby for thirty minutes without an issue.    Julius Bowels 10/26/2022, 11:04 AM

## 2022-11-04 ENCOUNTER — Ambulatory Visit (INDEPENDENT_AMBULATORY_CARE_PROVIDER_SITE_OTHER): Payer: 59

## 2022-11-04 ENCOUNTER — Encounter: Payer: Self-pay | Admitting: Allergy & Immunology

## 2022-11-04 DIAGNOSIS — J309 Allergic rhinitis, unspecified: Secondary | ICD-10-CM | POA: Diagnosis not present

## 2022-11-08 ENCOUNTER — Other Ambulatory Visit: Payer: Self-pay | Admitting: Nurse Practitioner

## 2022-11-08 DIAGNOSIS — E559 Vitamin D deficiency, unspecified: Secondary | ICD-10-CM

## 2022-11-11 ENCOUNTER — Encounter: Payer: Self-pay | Admitting: Allergy & Immunology

## 2022-11-15 ENCOUNTER — Ambulatory Visit: Payer: Self-pay

## 2022-11-15 NOTE — Patient Outreach (Signed)
  Care Coordination   11/15/2022 Name: Gabrielle Hawkins MRN: [id number] DOB: 15-Oct-1965   Care Coordination Outreach Attempts:  An unsuccessful telephone outreach was attempted for a scheduled appointment today.  Follow Up Plan:  Additional outreach attempts will be made to offer the patient care coordination information and services.   Encounter Outcome:  No Answer   Care Coordination Interventions:  No, not indicated    Barb Merino, RN, BSN, CCM Care Management Coordinator [facility] Care Management  Direct Phone: [phone]

## 2022-11-16 ENCOUNTER — Telehealth: Payer: 59 | Admitting: Nurse Practitioner

## 2022-11-18 ENCOUNTER — Encounter: Payer: Self-pay | Admitting: Nurse Practitioner

## 2022-11-18 ENCOUNTER — Telehealth (INDEPENDENT_AMBULATORY_CARE_PROVIDER_SITE_OTHER): Payer: 59 | Admitting: Nurse Practitioner

## 2022-11-18 ENCOUNTER — Ambulatory Visit (INDEPENDENT_AMBULATORY_CARE_PROVIDER_SITE_OTHER): Payer: 59 | Admitting: *Deleted

## 2022-11-18 DIAGNOSIS — I119 Hypertensive heart disease without heart failure: Secondary | ICD-10-CM | POA: Diagnosis not present

## 2022-11-18 DIAGNOSIS — E559 Vitamin D deficiency, unspecified: Secondary | ICD-10-CM | POA: Diagnosis not present

## 2022-11-18 DIAGNOSIS — J309 Allergic rhinitis, unspecified: Secondary | ICD-10-CM | POA: Diagnosis not present

## 2022-11-18 DIAGNOSIS — F419 Anxiety disorder, unspecified: Secondary | ICD-10-CM | POA: Diagnosis not present

## 2022-11-18 DIAGNOSIS — G4700 Insomnia, unspecified: Secondary | ICD-10-CM

## 2022-11-18 DIAGNOSIS — I2510 Atherosclerotic heart disease of native coronary artery without angina pectoris: Secondary | ICD-10-CM

## 2022-11-18 DIAGNOSIS — I10 Essential (primary) hypertension: Secondary | ICD-10-CM

## 2022-11-18 MED ORDER — AMITRIPTYLINE HCL 50 MG PO TABS: 50.0000 mg | ORAL_TABLET | Freq: Every day | ORAL | 1 refills | Status: DC

## 2022-11-18 MED ORDER — ATORVASTATIN CALCIUM 10 MG PO TABS: 10.0000 mg | ORAL_TABLET | Freq: Every day | ORAL | 1 refills | Status: DC

## 2022-11-18 MED ORDER — BUSPIRONE HCL 5 MG PO TABS: 5.0000 mg | ORAL_TABLET | Freq: Every day | ORAL | 1 refills | Status: DC

## 2022-11-18 MED ORDER — ESZOPICLONE 2 MG PO TABS: ORAL_TABLET | ORAL | 5 refills | Status: DC

## 2022-11-18 MED ORDER — OLMESARTAN MEDOXOMIL 20 MG PO TABS: 20.0000 mg | ORAL_TABLET | Freq: Every day | ORAL | 1 refills | Status: DC

## 2022-11-18 NOTE — Progress Notes (Addendum)
Virtual Visit via Mychart   This visit type was conducted due to national recommendations for restrictions regarding the COVID-19 Pandemic (e.g. social distancing) in an effort to limit this patient's exposure and mitigate transmission in our community.  Due to her co-morbid illnesses, this patient is at least at moderate risk for complications without adequate follow up.  This format is felt to be most appropriate for this patient at this time.  All issues noted in this document were discussed and addressed.  A limited physical exam was performed with this format.    This visit type was conducted due to national recommendations for restrictions regarding the COVID-19 Pandemic (e.g. social distancing) in an effort to limit this patient's exposure and mitigate transmission in our community.  Patients identity confirmed using two different identifiers.  This format is felt to be most appropriate for this patient at this time.  All issues noted in this document were discussed and addressed.  No physical exam was performed (except for noted visual exam findings with Video Visits).    Date:  12/22/2022   ID:  Gabrielle Hawkins, DOB 1965/12/18, MRN [id number]  Patient Location:  Car parked -   Provider location:   Office    Chief Complaint:  follow up hyperlipidemia  History of Present Illness:    Gabrielle Hawkins is a 57 y.o. female who presents via video conferencing for a telehealth visit today.    The patient does not have symptoms concerning for COVID-19 infection (fever, chills, cough, or new shortness of breath).   Patient reports today for bpc.  She reports no specific questions or concerns.  Denies headache, chest pain, SOB.  Patient requested refills on all current medications Rosielee Corporan prescribes.      Past Medical History:  Diagnosis Date   Anemia 1998   Anxiety    Arthritis    big toe    Asthma    BRCA1 negative    BRCA2 negative    Breast cancer (HCC) 2006    chem/radiation/2years tamoxifen   Chronic neck and back pain    Crohn's disease (HCC)    Depression    Endometriosis    Fibroid    GERD (gastroesophageal reflux disease)    History of chemotherapy 01/2005   Hx of tamoxifen therapy    Hx of transfusion of packed red blood cells    Hypertension    IBS (irritable bowel syndrome)    Kidney stones    Lymphedema of arm    Migraines    just at dx of breast CA   Mucinous cystadenoma of ovary    left   Neuropathy    Paresthesias    Peripheral neuropathy    Personal history of chemotherapy    Personal history of radiation therapy    Radiation 04/2005   Ulcerative colitis (HCC)    with chronic diarrhea   Urinary tract infection    x 3   Vertigo 2014   Vitamin D deficiency    Past Surgical History:  Procedure Laterality Date   ABDOMINAL HYSTERECTOMY  09/05/2005   TAH/BSO   ANTERIOR INTEROSSEOUS NERVE DECOMPRESSION Right 01/13/2022   Procedure: ANTERIOR INTEROSSEOUS NERVE DECOMPRESSION;  Surgeon: Bjorn Pippin, MD;  Location: [facility];  Service: Orthopedics;  Laterality: Right;   BREAST LUMPECTOMY     BREAST SURGERY  09/05/2004   LUMPECTOMY   CHOLECYSTECTOMY     CHOLECYSTECTOMY N/A 04/29/2014   Procedure: LAPAROSCOPIC CHOLECYSTECTOMY WITH INTRAOPERATIVE CHOLANGIOGRAM;  Surgeon:  Mariella Saa, MD;  Location: [facility];  Service: General;  Laterality: N/A;   COLONOSCOPY  02/04/2012   Dr. Elnoria Howard: sessile serrated adenoma   COLONOSCOPY WITH PROPOFOL N/A 03/13/2015   Procedure: COLONOSCOPY WITH PROPOFOL;  Surgeon: Jeani Hawking, MD;  Location: [facility];  Service: Endoscopy;  Laterality: N/A;   DILATION AND CURETTAGE OF UTERUS     ELBOW LIGAMENT RECONSTRUCTION Right 11/15/2021   Procedure: ELBOW LIGAMENT REPAIR/ LCL;  Surgeon: Bjorn Pippin, MD;  Location: [facility];  Service: Orthopedics;  Laterality: Right;  Regional block   FLEXIBLE SIGMOIDOSCOPY N/A 07/18/2014   Dr. Elnoria Howard: mild left-sided  colitis, biopsies with quiescent UC   HARDWARE REMOVAL Right 01/13/2022   Procedure: HARDWARE REMOVAL/ELBOW;  Surgeon: Bjorn Pippin, MD;  Location: [facility];  Service: Orthopedics;  Laterality: Right;   MYOMECTOMY     ORIF RADIAL FRACTURE Right 11/15/2021   Procedure: RIGHT RADIAL HEAD ARTHROPLASTY;  Surgeon: Bjorn Pippin, MD;  Location: [facility];  Service: Orthopedics;  Laterality: Right;  Regional block   ORIF ULNAR FRACTURE Right 11/15/2021   Procedure: OPEN REDUCTION INTERNAL FIXATION (ORIF) RIGHT ULNAR FRACTURE;  Surgeon: Bjorn Pippin, MD;  Location: [facility];  Service: Orthopedics;  Laterality: Right;  Regional block   TONSILLECTOMY       Current Meds  Medication Sig   albuterol (VENTOLIN HFA) 108 (90 Base) MCG/ACT inhaler Inhale 2 puffs into the lungs every 6 (six) hours as needed for wheezing or shortness of breath.   EPINEPHrine (EPIPEN 2-PAK) 0.3 mg/0.3 mL IJ SOAJ injection Inject 0.3 mg into the muscle as needed for anaphylaxis.   folic acid (FOLVITE) 1 MG tablet Take 3 mg by mouth daily.   golimumab (SIMPONI ARIA) 50 MG/4ML SOLN injection 50 mg See admin instructions. Infusion every 8 weeks, [place] [facility]   levocetirizine (XYZAL) 5 MG tablet Take 1 tablet (5 mg total) by mouth every evening.   methotrexate (50 MG/ML) 1 g injection Inject into the vein every Monday. 0.55ml every Monday   omeprazole (PRILOSEC) 40 MG capsule Take 1 capsule (40 mg total) by mouth daily.   OVER THE COUNTER MEDICATION Aleve cream for pain   tiZANidine (ZANAFLEX) 2 MG tablet Take 2 mg by mouth 3 (three) times daily.   Vitamin D, Ergocalciferol, (DRISDOL) 1.25 MG (50000 UNIT) CAPS capsule Take 1 capsule (50,000 Units total) by mouth 2 (two) times a week. Monday and Wednesday   [DISCONTINUED] amitriptyline (ELAVIL) 50 MG tablet Take 1 tablet (50 mg total) by mouth at bedtime.   [DISCONTINUED] atorvastatin (LIPITOR) 20 MG tablet Take 1 tablet  (20 mg total) by mouth daily.   [DISCONTINUED] busPIRone (BUSPAR) 5 MG tablet Take 1 tablet (5 mg total) by mouth at bedtime.   [DISCONTINUED] cetirizine (ZYRTEC) 10 MG tablet Take 10 mg by mouth daily.   [DISCONTINUED] eszopiclone (LUNESTA) 2 MG TABS tablet Take immediately before bedtime   [DISCONTINUED] olmesartan (BENICAR) 20 MG tablet Take 1 tablet (20 mg total) by mouth daily.     Allergies:   Humira [adalimumab], Ampicillin, Codeine, Compazine [prochlorperazine maleate], Cymbalta [duloxetine hcl], Imitrex [sumatriptan base], Lyrica [pregabalin], Metoclopramide hcl, Percocet [oxycodone-acetaminophen], Effexor [venlafaxine hydrochloride], Fentanyl, and Tape   Social History   Tobacco Use   Smoking status: Never   Smokeless tobacco: Never  Vaping Use   Vaping Use: Never used  Substance Use Topics   Alcohol use: No   Drug use: Never  Family Hx: The patient's family history includes Breast cancer (age of onset: 63) in her mother; Cancer in her maternal grandmother; Heart disease in her father and paternal grandmother; Heart failure in her father and paternal grandmother.  ROS:   Please see the history of present illness.    Review of Systems  Constitutional: Negative.   HENT: Negative.    Gastrointestinal: Negative.   Genitourinary: Negative.   Musculoskeletal: Negative.   Endo/Heme/Allergies: Negative.   Psychiatric/Behavioral: Negative.      All other systems reviewed and are negative.   Labs/Other Tests and Data Reviewed:    Recent Labs: 11/24/2022: ALT 31; BUN 12; Creatinine, Ser 0.98; Potassium 4.0; Sodium 142   Recent Lipid Panel Lab Results  Component Value Date/Time   CHOL 150 10/10/2022 10:53 AM   TRIG 51 10/10/2022 10:53 AM   HDL 71 10/10/2022 10:53 AM   CHOLHDL 2.1 10/10/2022 10:53 AM   CHOLHDL 3.5 12/24/2009 08:17 AM   LDLCALC 68 10/10/2022 10:53 AM    Wt Readings from Last 3 Encounters:  09/26/22 200 lb (90.7 kg)  09/07/22 204 lb (92.5 kg)   08/31/22 204 lb (92.5 kg)     Exam:    Vital Signs:  There were no vitals taken for this visit.    Physical Exam Constitutional:      General: She is not in acute distress.    Appearance: Normal appearance.  Pulmonary:     Effort: Pulmonary effort is normal. No respiratory distress.  Neurological:     General: No focal deficit present.     Mental Status: She is alert and oriented to person, place, and time. Mental status is at baseline.     Cranial Nerves: No cranial nerve deficit.  Psychiatric:        Mood and Affect: Mood and affect normal.        Behavior: Behavior normal.        Thought Content: Thought content normal.        Cognition and Memory: Memory normal.        Judgment: Judgment normal.     ASSESSMENT & PLAN:    Essential hypertension - Blood pressure is controlled, continue current medications - Plan: CMP14+EGFR, DISCONTINUED: olmesartan (BENICAR) 20 MG tablet, CANCELED: CMP14+EGFR  Vitamin D deficiency - She is to come in for her vitamin d check on 3/21. - Plan: Vitamin D (25 hydroxy), CANCELED: Vitamin D (25 hydroxy)  Anxiety - currently stable. Continue current medications. - Plan: busPIRone (BUSPAR) 5 MG tablet  Insomnia, unspecified type - doing well with Lunesta - Plan: eszopiclone (LUNESTA) 2 MG TABS tablet  Atherosclerotic cardiovascular disease - she is tolerating statin better at 10 mg daily - Plan: DISCONTINUED: atorvastatin (LIPITOR) 10 MG tablet  Hypertensive heart disease without congestive heart failure - Currently at home did not obtain blood pressure   COVID-19 Education: The signs and symptoms of COVID-19 were discussed with the patient and how to seek care for testing (follow up with PCP or arrange E-visit).  The importance of social distancing was discussed today.  Patient Risk:   After full review of this patients clinical status, I feel that they are at least moderate risk at this time.  Time:   Today, I have spent 8 minutes/  seconds with the patient with telehealth technology discussing above diagnoses.     Medication Adjustments/Labs and Tests Ordered: Current medicines are reviewed at length with the patient today.  Concerns regarding medicines are outlined above.  Tests Ordered: Orders Placed This Encounter  Procedures   CMP14+EGFR   Vitamin D (25 hydroxy)    Medication Changes: Meds ordered this encounter  Medications   DISCONTD: atorvastatin (LIPITOR) 10 MG tablet    Sig: Take 1 tablet (10 mg total) by mouth daily.    Dispense:  90 tablet    Refill:  1   amitriptyline (ELAVIL) 50 MG tablet    Sig: Take 1 tablet (50 mg total) by mouth at bedtime.    Dispense:  90 tablet    Refill:  1   busPIRone (BUSPAR) 5 MG tablet    Sig: Take 1 tablet (5 mg total) by mouth at bedtime.    Dispense:  90 tablet    Refill:  1    This prescription was filled on 11/06/2020. Any refills authorized will be placed on file.   eszopiclone (LUNESTA) 2 MG TABS tablet    Sig: Take immediately before bedtime    Dispense:  30 tablet    Refill:  5   DISCONTD: olmesartan (BENICAR) 20 MG tablet    Sig: Take 1 tablet (20 mg total) by mouth daily.    Dispense:  90 tablet    Refill:  1    Disposition:  Follow up in 4 month(s)  Signed, Arnette Felts, FNP

## 2022-11-20 ENCOUNTER — Other Ambulatory Visit: Payer: Self-pay | Admitting: Nurse Practitioner

## 2022-11-23 ENCOUNTER — Ambulatory Visit (INDEPENDENT_AMBULATORY_CARE_PROVIDER_SITE_OTHER): Payer: 59

## 2022-11-23 DIAGNOSIS — J309 Allergic rhinitis, unspecified: Secondary | ICD-10-CM

## 2022-11-24 ENCOUNTER — Other Ambulatory Visit: Payer: 59

## 2022-11-24 ENCOUNTER — Ambulatory Visit
Admission: RE | Admit: 2022-11-24 | Discharge: 2022-11-24 | Disposition: A | Discharge status: Home / Self Care | Payer: 59 | Source: Ambulatory Visit | Attending: Nurse Practitioner | Admitting: Nurse Practitioner

## 2022-11-24 DIAGNOSIS — E2839 Other primary ovarian failure: Secondary | ICD-10-CM

## 2022-11-24 DIAGNOSIS — Z780 Asymptomatic menopausal state: Secondary | ICD-10-CM | POA: Diagnosis not present

## 2022-11-24 DIAGNOSIS — M8589 Other specified disorders of bone density and structure, multiple sites: Secondary | ICD-10-CM | POA: Diagnosis not present

## 2022-11-24 DIAGNOSIS — I10 Essential (primary) hypertension: Secondary | ICD-10-CM | POA: Diagnosis not present

## 2022-11-24 DIAGNOSIS — E559 Vitamin D deficiency, unspecified: Secondary | ICD-10-CM

## 2022-11-25 ENCOUNTER — Encounter: Payer: Self-pay | Admitting: Nurse Practitioner

## 2022-11-25 LAB — CMP14+EGFR
ALT: 31 IU/L (ref 0–32)
AST: 20 IU/L (ref 0–40)
Albumin/Globulin Ratio: 1.6 (ref 1.2–2.2)
Albumin: 4.2 g/dL (ref 3.8–4.9)
Alkaline Phosphatase: 112 IU/L (ref 44–121)
BUN/Creatinine Ratio: 12 (ref 9–23)
BUN: 12 mg/dL (ref 6–24)
Bilirubin Total: 0.5 mg/dL (ref 0.0–1.2)
CO2: 23 mmol/L (ref 20–29)
Calcium: 9.2 mg/dL (ref 8.7–10.2)
Chloride: 102 mmol/L (ref 96–106)
Creatinine, Ser: 0.98 mg/dL (ref 0.57–1.00)
Globulin, Total: 2.7 g/dL (ref 1.5–4.5)
Glucose: 89 mg/dL (ref 70–99)
Potassium: 4 mmol/L (ref 3.5–5.2)
Sodium: 142 mmol/L (ref 134–144)
Total Protein: 6.9 g/dL (ref 6.0–8.5)
eGFR: 68 mL/min/{1.73_m2} (ref >59)

## 2022-11-25 LAB — VITAMIN D 25 HYDROXY (VIT D DEFICIENCY, FRACTURES): Vit D, 25-Hydroxy: 87.4 ng/mL (ref 30.0–100.0)

## 2022-11-28 DIAGNOSIS — Z1211 Encounter for screening for malignant neoplasm of colon: Secondary | ICD-10-CM | POA: Diagnosis not present

## 2022-11-28 DIAGNOSIS — K5190 Ulcerative colitis, unspecified, without complications: Secondary | ICD-10-CM | POA: Diagnosis not present

## 2022-11-28 DIAGNOSIS — K219 Gastro-esophageal reflux disease without esophagitis: Secondary | ICD-10-CM | POA: Diagnosis not present

## 2022-11-30 ENCOUNTER — Ambulatory Visit (INDEPENDENT_AMBULATORY_CARE_PROVIDER_SITE_OTHER): Payer: 59

## 2022-11-30 DIAGNOSIS — J309 Allergic rhinitis, unspecified: Secondary | ICD-10-CM

## 2022-12-02 ENCOUNTER — Ambulatory Visit: Payer: Self-pay

## 2022-12-02 NOTE — Patient Instructions (Signed)
Visit Information  Thank you for taking time to visit with me today. Please don't hesitate to contact me if I can be of assistance to you.   Following are the goals we discussed today:   Goals Addressed               This Visit's Progress     Patient Stated     I started Cholesterol medicine (pt-stated)        Care Coordination Interventions: Provider established cholesterol goals reviewed Reviewed exercise goals and target of 150 minutes per week Educated patient about the PREP program, mailed printed brochure for patient review and consideration  Lipid Panel     Component Value Date/Time   CHOL 150 10/10/2022 1053   TRIG 51 10/10/2022 1053   HDL 71 10/10/2022 1053   CHOLHDL 2.1 10/10/2022 1053   CHOLHDL 3.5 12/24/2009 0817   VLDL 10 12/24/2009 0817   LDLCALC 68 10/10/2022 1053   LABVLDL 11 10/10/2022 1053            COMPLETED: My vitamin D is still low (pt-stated)        Care Coordination Interventions: Evaluation of current treatment plan related to Vitamin D deficiency and patient's adherence to plan as established by provider Review of patient status, including review of consultant's reports, relevant laboratory and other test results, and medications completed Determined patient completed her Dexa scan, PCP reviewed her results Educated patient about Osteopenia and best practice for weight bearing exercise regimen, follow recommendations for supplemental Calcium and Vitamin D Reviewed follow up recommendations for 2 years          Other     To reduce stress and anxiety related to caregiver assistance        Care Coordination Interventions: Evaluation of current treatment plan related to anxiety  and patient's adherence to plan as established by provider Discussed with patient she is experiencing stress and anxiety in her life to due to her mother having a new medical diagnosis Determined her mother will undergo surgery in the upcoming weeks/month Active  listening / Reflection utilized  Emotional Support Provided Encouraged patient to notify her doctor of new or worsening symptoms related to stress and anxiety               Our next appointment is by telephone on 02/01/23 at 11:30 AM  Please call the care guide team at [phone] if you need to cancel or reschedule your appointment.   If you are experiencing a Mental Health or [place] or need someone to talk to, please call [phone]-TALK (toll free, 24 hour hotline) go to [facility] [address] [place] [phone])  Patient verbalizes understanding of instructions and care plan provided today and agrees to view in [place]. Active MyChart status and patient understanding of how to access instructions and care plan via MyChart confirmed with patient.     Barb Merino, RN, BSN, CCM Care Management Coordinator [facility] Care Management Direct Phone: [phone]

## 2022-12-02 NOTE — Patient Outreach (Signed)
Care Coordination   Follow Up Visit Note   12/02/2022 Name: Gabrielle Hawkins MRN: [id number] DOB: [birth year]  Gabrielle Hawkins is a 57 y.o. year old female who sees [name], [place] for primary care. I spoke with  [name] by phone today.  What matters to the patients health and wellness today?  Patient will establish weight bearing exercises and add Calcium and Vitamin D as directed for management of Osteopenia. Patient will continue to receive allergy injections as directed.     Goals Addressed               This Visit's Progress     Patient Stated     I started Cholesterol medicine (pt-stated)        Care Coordination Interventions: Provider established cholesterol goals reviewed Reviewed exercise goals and target of 150 minutes per week Educated patient about the PREP program, mailed printed brochure for patient review and consideration  Lipid Panel     Component Value Date/Time   CHOL 150 10/10/2022 1053   TRIG 51 10/10/2022 1053   HDL 71 10/10/2022 1053   CHOLHDL 2.1 10/10/2022 1053   CHOLHDL 3.5 12/24/2009 0817   VLDL 10 12/24/2009 0817   LDLCALC 68 10/10/2022 1053   LABVLDL 11 10/10/2022 1053            COMPLETED: My vitamin D is still low (pt-stated)        Care Coordination Interventions: Evaluation of current treatment plan related to Vitamin D deficiency and patient's adherence to plan as established by provider Review of patient status, including review of consultant's reports, relevant laboratory and other test results, and medications completed Determined patient completed her Dexa scan, PCP reviewed her results Educated patient about Osteopenia and best practice for weight bearing exercise regimen, follow recommendations for supplemental Calcium and Vitamin D Reviewed follow up recommendations for 2 years          Other     To reduce stress and anxiety related to caregiver assistance        Care Coordination  Interventions: Evaluation of current treatment plan related to anxiety  and patient's adherence to plan as established by provider Discussed with patient she is experiencing stress and anxiety in her life to due to her mother having a new medical diagnosis Determined her mother will undergo surgery in the upcoming weeks/month Active listening / Reflection utilized  Emotional Support Provided Encouraged patient to notify her doctor of new or worsening symptoms related to stress and anxiety     Interventions Today    Flowsheet Row Most Recent Value  Chronic Disease   Chronic disease during today's visit Other  [Ulcerative Colitis, Allergies,  Osteopenia]  General Interventions   General Interventions Discussed/Reviewed General Interventions Discussed, General Interventions Reviewed, Lipid Profile, Doctor Visits  Doctor Visits Discussed/Reviewed Doctor Visits Discussed, Doctor Visits Reviewed, Specialist, PCP  Exercise Interventions   Exercise Discussed/Reviewed Physical Activity, Exercise Reviewed, Exercise Discussed  Physical Activity Discussed/Reviewed Types of exercise, Physical Activity Reviewed, Physical Activity Discussed, PREP  Education Interventions   Education Provided Provided Education  Provided Verbal Education On When to see the doctor, Labs, Mental Health/Coping with Illness, Exercise, Medication  Labs Reviewed Lipid Profile  [Vitamin D]  [place] Discussed/Reviewed Anxiety  [mother has new CA dx]  Pharmacy Interventions   Pharmacy Dicussed/Reviewed Pharmacy Topics Discussed, Pharmacy Topics Reviewed, Medications and their functions    SDOH assessments and interventions completed:  No  Care Coordination Interventions:  Yes, provided   Follow up plan: Follow up call scheduled for 02/01/23 @11 :30 AM    Encounter Outcome:  Pt. Visit Completed

## 2022-12-05 DIAGNOSIS — M459 Ankylosing spondylitis of unspecified sites in spine: Secondary | ICD-10-CM | POA: Diagnosis not present

## 2022-12-05 DIAGNOSIS — K5190 Ulcerative colitis, unspecified, without complications: Secondary | ICD-10-CM | POA: Diagnosis not present

## 2022-12-07 ENCOUNTER — Telehealth: Payer: Self-pay

## 2022-12-07 ENCOUNTER — Ambulatory Visit (INDEPENDENT_AMBULATORY_CARE_PROVIDER_SITE_OTHER): Payer: 59

## 2022-12-07 DIAGNOSIS — J309 Allergic rhinitis, unspecified: Secondary | ICD-10-CM | POA: Diagnosis not present

## 2022-12-07 NOTE — Progress Notes (Signed)
Care Management & Coordination Services Pharmacy Team  Reason for Encounter: Appointment Reminder  Contacted patient to confirm telephone appointment with Orlando Penner, PharmD on 12-09-2022 at 11:00. Unsuccessful outreach. Left voicemail for patient to return call.  Chart review: Recent office visits:  12-02-2022 Lynne Logan, RN ([place]).  11-18-2022 [name], [place]. Decrease atorvastatin 20 mg daily to 10 mg daily.  08-24-2022 Kellie Simmering, LPN. Medicare wellness visit. Stop dicyclomine per patient.  08-16-2022 Little, [name], RN ([facility]).  07-25-2022 [name], [place]. Visit for watery eyes. Referral placed to allergy. Prescribed epipen inject 0.3 mg PRN.  Recent consult visits:  12-07-2022 Julius Bowels, CMA (Allergist). Visit for immunotherapy.  12-05-2022 [name] (Rheumatology). Simponi injection given.  11-30-2022 Julius Bowels, CMA (Allergist). Visit for immunotherapy.  11-28-2022 [name] [facility]). Colonoscopy completed.  11-23-2022 Julius Bowels, CMA  (Allergist). Visit for immunotherapy.  11-18-2022 Derl Barrow, CMA (Allergist). Visit for immunotherapy.  11-04-2022 Julius Bowels, CMA  (Allergist). Visit for immunotherapy.  10-26-2022 Julius Bowels, CMA  (Allergist). Visit for immunotherapy.  10-10-2022 [name] (Rheumatology). Simponi injection given.  09-30-2022 Valentina Shaggy, MD (Allergist). Visit for final patch test interpretation. Test reactive to thimerosal. Follow up in 4 months.  08-15-2022 [name] (Rheumatology). Unable to view encounter.  09-28-2022 Dara Hoyer, FNP (Allergist). Visit for patch test. Follow up in 2 days.  09-26-2022 Larose Kells, MD (Allergist). Visit for patch test placement. Follow up in 2 days.   09-07-2022 Valentina Shaggy, MD (Allergist). Allergy test completed. Start Xyzal 5 mg every evening and Ryaltris 1 spray twice daily.  09-07-2022 ANGELA  HAWKES (Rheumatology). Depo medrol administered. Follow up in 4 months  08-31-2022 Valentina Shaggy, MD (Allergist). Initial visit for Mild persistent asthma.  07-18-2022 [name] (Rheumatology). Unable to view encounter.  07-12-2022 Janina Mayo (Cardiology). Unable to view encounter.  07-08-2022 Janina Mayo (Cardiology). EKG completed. Increase atorvastatin 10 mg daily to 20 mg daily. Stop docusate, gabapentin and hydrocodone.  Hospital visits:  None in previous 6 months   Star Rating Drugs: Atorvastatin 10 mg- Last filled 11-18-2022 90 DS upstream. Previous 20 mg 09-20-2022 90 DS.  Care Gaps: Annual wellness visit in last year? Yes Shingrix overdue Covid booster overdue   [place] Clinical Pharmacist Assistant [phone]

## 2022-12-09 ENCOUNTER — Ambulatory Visit: Payer: 59

## 2022-12-09 DIAGNOSIS — I10 Essential (primary) hypertension: Secondary | ICD-10-CM

## 2022-12-09 MED ORDER — ATORVASTATIN CALCIUM 20 MG PO TABS: 20.0000 mg | ORAL_TABLET | Freq: Every day | ORAL | 11 refills | Status: DC

## 2022-12-09 MED ORDER — OLMESARTAN MEDOXOMIL 20 MG PO TABS: 20.0000 mg | ORAL_TABLET | Freq: Every day | ORAL | 1 refills | Status: DC

## 2022-12-09 NOTE — Progress Notes (Signed)
Care Management & Coordination Services Pharmacy Note  12/09/2022 Name:  Gabrielle Hawkins MRN:  [id number] DOB:  08-13-1966  Summary: Patient reports that she has been doing. Her diet has been horrible. She reports that her mother was diagnosed with breast cancer and she will be taking care of her through out her recovery process.  She reports that she is open to coming in the office on August 6 th when her great aunts - Jacelyn Grip appointment is if possible.   Recommendations/Changes made from today's visit: Recommend increasing patients Atorvastatin to 20 mg tablet daily due Atherosclerosis diagnosis from 02/2021, of note I started the patient on Atorvastatin 10 mg tablet in November and patients dose was increased to 20 mg tablet once per day by Cardiology team.  Recommend patients Vitamin D is decreased to once per week or stopped and patient can be started on 1000 mg tablet daily, with Calcium.  Recommend patient have an appointment with the PCP prior to her scheduled appointment in 2025 due to her chronic conditions.   Follow up plan: Recommend increasing the Atorvastatin 20 mg tablet once per day collaborated with NP who agreed.  Patient needs a refill of Olmesartan prescription sent to pharmacy.  Discontinuing the vitamin D biweekly supplementation- patients current lab 89.7, in range, and currently taking daily Vitamin D supplementation. Take to two calcium gummies with the women's multivitamin Eat at least two servings of vegetables daily.    Subjective: Gabrielle Hawkins is an 57 y.o. year old female who is a primary patient of Arnette Felts, FNP.  The care coordination team was consulted for assistance with disease management and care coordination needs.    Engaged with patient by telephone for follow up visit.  Chart review: Recent office visits:  12-02-2022 Riley Churches, RN (CC).   11-18-2022 Arnette Felts, FNP. Decrease atorvastatin 20 mg daily to 10 mg  daily.   08-24-2022 Barb Merino, LPN. Medicare wellness visit. Stop dicyclomine per patient.   08-16-2022 Little, Karma Lew, RN (CC).   07-25-2022 Arnette Felts, FNP. Visit for watery eyes. Referral placed to allergy. Prescribed epipen inject 0.3 mg PRN.   Recent consult visits:  12-07-2022 Ralene Muskrat, CMA (Allergist). Visit for immunotherapy.   12-05-2022 [name] (Rheumatology). Simponi injection given.   11-30-2022 Ralene Muskrat, CMA (Allergist). Visit for immunotherapy.   11-28-2022 Jeani Hawking [facility]). Colonoscopy completed.   11-23-2022 Ralene Muskrat, CMA  (Allergist). Visit for immunotherapy.   11-18-2022 Ma Hillock, CMA (Allergist). Visit for immunotherapy.   11-04-2022 Ralene Muskrat, CMA  (Allergist). Visit for immunotherapy.   10-26-2022 Ralene Muskrat, CMA  (Allergist). Visit for immunotherapy.   10-10-2022 [name] (Rheumatology). Simponi injection given.   09-30-2022 Alfonse Spruce, MD (Allergist). Visit for final patch test interpretation. Test reactive to thimerosal. Follow up in 4 months.   08-15-2022 [name] (Rheumatology). Unable to view encounter.   09-28-2022 Hetty Blend, FNP (Allergist). Visit for patch test. Follow up in 2 days.   09-26-2022 Birder Robson, MD (Allergist). Visit for patch test placement. Follow up in 2 days.    09-07-2022 Alfonse Spruce, MD (Allergist). Allergy test completed. Start Xyzal 5 mg every evening and Ryaltris 1 spray twice daily.   09-07-2022 [name] (Rheumatology). Depo medrol administered. Follow up in 4 months   08-31-2022 Alfonse Spruce, MD (Allergist). Initial visit for Mild persistent asthma.   07-18-2022 [name] (Rheumatology). Unable to view encounter.  07-12-2022 Maisie Fus (Cardiology). Unable to view encounter.   07-08-2022 Maisie Fus (Cardiology). EKG completed. Increase atorvastatin 10 mg daily to 20 mg  daily. Stop docusate, gabapentin and hydrocodone.   Hospital visits:  None in previous 6 months   Objective:  Lab Results  Component Value Date   CREATININE 0.98 11/24/2022   BUN 12 11/24/2022   GFR 102.57 02/14/2013   EGFR 68 11/24/2022   GFRNONAA >60 09/21/2021   GFRAA 86 02/18/2020   NA 142 11/24/2022   K 4.0 11/24/2022   CALCIUM 9.2 11/24/2022   CO2 23 11/24/2022   GLUCOSE 89 11/24/2022    Lab Results  Component Value Date/Time   GFR 102.57 02/14/2013 02:49 PM   MICROALBUR 10 06/04/2019 12:03 PM    Last diabetic Eye exam: No results found for: "HMDIABEYEEXA"  Last diabetic Foot exam: No results found for: "HMDIABFOOTEX"   Lab Results  Component Value Date   CHOL 150 10/10/2022   HDL 71 10/10/2022   LDLCALC 68 10/10/2022   TRIG 51 10/10/2022   CHOLHDL 2.1 10/10/2022       Latest Ref Rng & Units 11/24/2022   11:10 AM 01/14/2021    1:05 PM 06/27/2020   12:44 PM  Hepatic Function  Total Protein 6.0 - 8.5 g/dL 6.9  8.0  7.6   Albumin 3.8 - 4.9 g/dL 4.2  3.8  3.9   AST 0 - 40 IU/L 20  18  17    ALT 0 - 32 IU/L 31  18  20    Alk Phosphatase 44 - 121 IU/L 112  88  81   Total Bilirubin 0.0 - 1.2 mg/dL 0.5  0.6  0.7     Lab Results  Component Value Date/Time   TSH 1.640 11/17/2020 03:25 PM   TSH 2.160 03/22/2013 01:42 AM   FREET4 1.12 07/08/2011 12:04 PM       Latest Ref Rng & Units 09/21/2021    3:16 PM 01/14/2021    1:05 PM 06/27/2020   12:44 PM  CBC  WBC 4.0 - 10.5 K/uL 5.2  6.6  7.3   Hemoglobin 12.0 - 15.0 g/dL 16.1  09.6  04.5   Hematocrit 36.0 - 46.0 % 42.7  40.7  41.9   Platelets 150 - 400 K/uL 185  209  204     Lab Results  Component Value Date/Time   VD25OH 87.4 11/24/2022 11:10 AM   VD25OH 21.0 (L) 05/30/2022 10:59 AM   VITAMINB12 150 (L) 02/14/2013 02:49 PM    Clinical ASCVD: No  The 10-year ASCVD risk score (Arnett DK, et al., 2019) is: 3.4%   Values used to calculate the score:     Age: 50 years     Sex: Female     Is Non-Hispanic  African American: Yes     Diabetic: No     Tobacco smoker: No     Systolic Blood Pressure: 133 mmHg     Is BP treated: Yes     HDL Cholesterol: 71 mg/dL     Total Cholesterol: 150 mg/dL        12/12/8117    1:47 PM 08/24/2022    8:19 AM 05/30/2022    9:50 AM  Depression screen PHQ 2/9  Decreased Interest 0 0 0  Down, Depressed, Hopeless 0 0 0  PHQ - 2 Score 0 0 0     Social History   Tobacco Use  Smoking Status Never  Smokeless Tobacco Never   BP Readings  from Last 3 Encounters:  09/26/22 128/86  09/07/22 130/70  08/31/22 130/70   Pulse Readings from Last 3 Encounters:  09/26/22 92  09/07/22 98  08/31/22 90   Wt Readings from Last 3 Encounters:  09/26/22 200 lb (90.7 kg)  09/07/22 204 lb (92.5 kg)  08/31/22 204 lb (92.5 kg)   BMI Readings from Last 3 Encounters:  09/26/22 30.41 kg/m  09/07/22 31.02 kg/m  08/31/22 31.02 kg/m    Allergies  Allergen Reactions   Humira [Adalimumab] Anaphylaxis   Ampicillin Hives and Itching   Codeine    Compazine [Prochlorperazine Maleate] Other (See Comments)    "Stroke-like symptoms" Can tolerate Phenergan.   Cymbalta [Duloxetine Hcl] Other (See Comments)    Fainting   Imitrex [Sumatriptan Base] Hives and Nausea And Vomiting   Lyrica [Pregabalin] Other (See Comments)    Fainting   Metoclopramide Hcl Hives   Percocet [Oxycodone-Acetaminophen] Itching   Effexor [Venlafaxine Hydrochloride] Hives and Palpitations   Fentanyl Nausea And Vomiting   Tape Itching and Rash    Medications Reviewed Today     Reviewed by Riley Churches, RN (Registered Nurse) on 12/02/22 at 1221  Med List Status: <None>   Medication Order Taking? Sig Documenting Provider Last Dose Status Informant  albuterol (VENTOLIN HFA) 108 (90 Base) MCG/ACT inhaler [id number] No Inhale 2 puffs into the lungs every 6 (six) hours as needed for wheezing or shortness of breath. Arnette Felts, FNP Taking Active   amitriptyline (ELAVIL) 50 MG tablet [id number]   Take 1 tablet (50 mg total) by mouth at bedtime. Arnette Felts, FNP  Active   atorvastatin (LIPITOR) 10 MG tablet [id number]  Take 1 tablet (10 mg total) by mouth daily. Arnette Felts, FNP  Active   busPIRone (BUSPAR) 5 MG tablet [id number]  Take 1 tablet (5 mg total) by mouth at bedtime. Arnette Felts, FNP  Active   cetirizine (ZYRTEC) 10 MG tablet [id number] No Take 10 mg by mouth daily. [provider] Taking Active Self  EPINEPHrine (EPIPEN 2-PAK) 0.3 mg/0.3 mL IJ SOAJ injection [id number] No Inject 0.3 mg into the muscle as needed for anaphylaxis. Arnette Felts, FNP Taking Active   eszopiclone Alfonso Patten) 2 MG TABS tablet [id number]  Take immediately before bedtime Arnette Felts, FNP  Active   folic acid (FOLVITE) 1 MG tablet [id number] No Take 3 mg by mouth daily. [provider] Taking Active Self  golimumab (SIMPONI ARIA) 50 MG/4ML SOLN injection [id number] No 50 mg See admin instructions. Infusion every 8 weeks, [facility] [provider] Taking Active Self  Patient not taking:  Discontinued 11/02/20 0709          Med Note (LITTLE, ANGEL L   Wed Feb 26, 2020 10:00 AM)    levocetirizine (XYZAL) 5 MG tablet [id number] No Take 1 tablet (5 mg total) by mouth every evening. Alfonse Spruce, MD Taking Active   methotrexate (50 MG/ML) 1 g injection [id number] No Inject into the vein every Monday. 0.51ml every Monday [provider] Taking Active Self  olmesartan (BENICAR) 20 MG tablet [id number]  Take 1 tablet (20 mg total) by mouth daily. Arnette Felts, FNP  Active   omeprazole (PRILOSEC) 40 MG capsule [id number] No Take 1 capsule (40 mg total) by mouth daily. Drema Halon, MD Taking Active Self  OVER THE COUNTER MEDICATION [id number] No Aleve cream for pain [provider] Taking Active Self  polyethylene glycol (MIRALAX / GLYCOLAX) 17 g packet [id number] No Take 17 g by mouth daily  as needed. [provider] Taking Active    Patient not taking:  Discontinued 11/02/20 0709   tiZANidine (ZANAFLEX) 2 MG tablet [id number] No Take 2 mg by mouth 3 (three) times daily. [provider] Taking Active Self           Med Note (LITTLE, Daphine DeutscherNGEL L   Thu Jul 09, 2020 11:44 AM) Patient is taking once daily at bedtime   Vitamin D, Ergocalciferol, (DRISDOL) 1.25 MG (50000 UNIT) CAPS capsule [id number] No Take 1 capsule (50,000 Units total) by mouth 2 (two) times a week. Monday and Wednesday Arnette FeltsMoore, Janece, FNP Taking Active             SDOH:  (Social Determinants of Health) assessments and interventions performed: No SDOH Interventions    Flowsheet Row Clinical [facility] Chronic Care [facility] Chronic Care Management [facility] Chronic Care Management [facility]  SDOH Interventions        Food Insecurity Interventions Intervention Not Indicated -- -- Intervention Not Indicated -- --  Housing Interventions -- -- -- Intervention Not Indicated -- --  Transportation Interventions Intervention Not Indicated -- -- Intervention Not Indicated -- --  Depression Interventions/Treatment  -- Medication PHQ2-9 Score <4 Follow-up Not Indicated -- -- --  Financial Strain Interventions Intervention Not Indicated -- -- -- Other (Comment)  [Will assist with patient assistance for Remicade if needed] Other (Comment)  [Will help patient apply for Remicade patient assistance program through [employer] and [employer]]  Physical Activity Interventions Other (Comments) -- -- -- -- --  Stress Interventions Intervention Not Indicated -- -- -- -- --       Medication  Assistance: None required.  Patient affirms current coverage meets needs.  Medication Access: Within the past 30 days, how often has patient missed a dose of medication? No Is a pillbox or other method used to improve adherence? Yes  Factors that may affect medication adherence? lack of understanding of disease management Are meds synced by current pharmacy? Yes  Are meds delivered by current pharmacy? Yes  Does patient experience delays in picking up medications due to transportation concerns? Yes   [facility] Reviewed: Is patient disadvantaged to use [facility]?: No  Current Rx insurance plan: [employer] care dual complete Name and location of Current pharmacy:  [facility] [place] [address] [place] [address] Phone: [phone] Fax: [phone]  [facility] [place] - [id number] [phone] [place] [id number] Phone: [phone] Fax: [phone]  [facility] services reviewed with patient today?: No  Reason patient declined to change pharmacies: Patient is already actively enrolled with [facility]  Compliance/Adherence/Medication fill history: Care Gaps: Shingrix Vaccine  Covid  Star-Rating Drugs: Atorvastatin 10 mg- Last filled 11-18-2022 [facility]. Previous 20 mg 09-20-2022 90 DS.  Olmesartan 20 mg tablet once per day    Assessment/Plan   Atherosclerosis of the Aorta: (LDL goal < 70) -Controlled -Current treatment: Atorvastatin 10 mg tablet once per day Appropriate, Query effective Patient reports that she is still taking Atorvastatin 20 mg tablet daily.  -Current dietary patterns: patient  reports that her diet is horrible. She reports that sometimes she waits until dinner time. Yesterday she had boiled shrimp and white rice. She reports that she has cut back on her fried foods and going out to eat.  -Current exercise  habits: she is walking her dog since he weather has gotten warmer, she is doing this at least 3-4 times per day. She is outside for at least 30 minutes each time, she might stay out longer if the weather is nice.  -Patient reports that her dose was reduced at the last visit because her numbers where in with in range.  -She is not having any issues with her medication  -Educated on Cholesterol goals;  Benefits of statin for ASCVD risk reduction; Importance of limiting foods high in cholesterol; -Collaborated with patient and she reports     Osteopenia (Goal: Reduce risk of fractures and falls ) -Uncontrolled -Last DEXA Scan: 11/24/2022   T-Score femoral neck: -1.6  T-Score lumbar spine: -1.3  10-year probability of major osteoporotic fracture: 6.0  10-year probability of hip fracture: 0.7 -Patient is not a candidate for pharmacologic treatment -Current treatment  Calcium - Vitamin D - Taking 3 gummies daily Appropriate, Effective, Query Safe,  [employer] multivitamin: 65 mg of calcium, 200 IU of vitamin D - once per day Appropriate, Effective, Safe, Accessible Vitamin D 50,000 IU- twice per week  Appropriate, Effective, Query Safe -Per patient she was told to start taking additional Calcium a couple of weeks ago.  -Currently she is taking 1565 Mg calcium per day, over 3200 units of Vitamin D.  -I discussed stopping the Vitamin D 50,000 IU twice per week, because her current Vitamin D is with in  range.  -Modify how she is taking Calcium - Vitamin D3 - she is only going to take two of the gummies and continue to take the multivitamin daily.  -Of note patient also eats plenty of cheese  -Recommend [phone] units of vitamin D daily. Recommend 1200 mg of calcium daily from dietary and supplemental sources. -Recommended to continue current medication  Cherylin Mylar, CPP, PharmD Clinical Pharmacist Practitioner [facility] [phone]

## 2022-12-14 ENCOUNTER — Ambulatory Visit (INDEPENDENT_AMBULATORY_CARE_PROVIDER_SITE_OTHER): Payer: 59

## 2022-12-14 DIAGNOSIS — J309 Allergic rhinitis, unspecified: Secondary | ICD-10-CM | POA: Diagnosis not present

## 2022-12-23 ENCOUNTER — Ambulatory Visit (INDEPENDENT_AMBULATORY_CARE_PROVIDER_SITE_OTHER): Payer: 59

## 2022-12-23 DIAGNOSIS — J309 Allergic rhinitis, unspecified: Secondary | ICD-10-CM

## 2022-12-30 ENCOUNTER — Ambulatory Visit (INDEPENDENT_AMBULATORY_CARE_PROVIDER_SITE_OTHER): Payer: 59

## 2022-12-30 DIAGNOSIS — J309 Allergic rhinitis, unspecified: Secondary | ICD-10-CM

## 2023-01-06 ENCOUNTER — Ambulatory Visit (INDEPENDENT_AMBULATORY_CARE_PROVIDER_SITE_OTHER): Payer: 59

## 2023-01-06 DIAGNOSIS — J309 Allergic rhinitis, unspecified: Secondary | ICD-10-CM | POA: Diagnosis not present

## 2023-01-10 DIAGNOSIS — N186 End stage renal disease: Secondary | ICD-10-CM | POA: Diagnosis not present

## 2023-01-10 DIAGNOSIS — K529 Noninfective gastroenteritis and colitis, unspecified: Secondary | ICD-10-CM | POA: Diagnosis not present

## 2023-01-10 DIAGNOSIS — E049 Nontoxic goiter, unspecified: Secondary | ICD-10-CM | POA: Diagnosis not present

## 2023-01-10 DIAGNOSIS — K219 Gastro-esophageal reflux disease without esophagitis: Secondary | ICD-10-CM | POA: Diagnosis not present

## 2023-01-10 DIAGNOSIS — Z87891 Personal history of nicotine dependence: Secondary | ICD-10-CM | POA: Diagnosis not present

## 2023-01-10 DIAGNOSIS — Z1211 Encounter for screening for malignant neoplasm of colon: Secondary | ICD-10-CM | POA: Diagnosis not present

## 2023-01-10 DIAGNOSIS — D62 Acute posthemorrhagic anemia: Secondary | ICD-10-CM | POA: Diagnosis not present

## 2023-01-10 DIAGNOSIS — Z7901 Long term (current) use of anticoagulants: Secondary | ICD-10-CM | POA: Diagnosis not present

## 2023-01-10 DIAGNOSIS — R12 Heartburn: Secondary | ICD-10-CM | POA: Diagnosis not present

## 2023-01-10 DIAGNOSIS — E785 Hyperlipidemia, unspecified: Secondary | ICD-10-CM | POA: Diagnosis not present

## 2023-01-10 DIAGNOSIS — N2581 Secondary hyperparathyroidism of renal origin: Secondary | ICD-10-CM | POA: Diagnosis not present

## 2023-01-10 DIAGNOSIS — Z992 Dependence on renal dialysis: Secondary | ICD-10-CM | POA: Diagnosis not present

## 2023-01-10 DIAGNOSIS — N179 Acute kidney failure, unspecified: Secondary | ICD-10-CM | POA: Diagnosis not present

## 2023-01-10 DIAGNOSIS — I5032 Chronic diastolic (congestive) heart failure: Secondary | ICD-10-CM | POA: Diagnosis not present

## 2023-01-10 DIAGNOSIS — K319 Disease of stomach and duodenum, unspecified: Secondary | ICD-10-CM | POA: Diagnosis not present

## 2023-01-10 DIAGNOSIS — R911 Solitary pulmonary nodule: Secondary | ICD-10-CM | POA: Diagnosis not present

## 2023-01-10 DIAGNOSIS — Z86711 Personal history of pulmonary embolism: Secondary | ICD-10-CM | POA: Diagnosis not present

## 2023-01-10 DIAGNOSIS — E872 Acidosis, unspecified: Secondary | ICD-10-CM | POA: Diagnosis not present

## 2023-01-10 DIAGNOSIS — K5730 Diverticulosis of large intestine without perforation or abscess without bleeding: Secondary | ICD-10-CM | POA: Diagnosis not present

## 2023-01-10 DIAGNOSIS — K259 Gastric ulcer, unspecified as acute or chronic, without hemorrhage or perforation: Secondary | ICD-10-CM | POA: Diagnosis not present

## 2023-01-11 ENCOUNTER — Ambulatory Visit: Payer: 59 | Admitting: Allergy & Immunology

## 2023-01-11 ENCOUNTER — Ambulatory Visit: Payer: 59 | Admitting: Family Medicine

## 2023-01-13 ENCOUNTER — Ambulatory Visit (INDEPENDENT_AMBULATORY_CARE_PROVIDER_SITE_OTHER): Payer: 59

## 2023-01-13 DIAGNOSIS — J309 Allergic rhinitis, unspecified: Secondary | ICD-10-CM

## 2023-01-25 ENCOUNTER — Ambulatory Visit (INDEPENDENT_AMBULATORY_CARE_PROVIDER_SITE_OTHER): Payer: 59

## 2023-01-25 DIAGNOSIS — J309 Allergic rhinitis, unspecified: Secondary | ICD-10-CM

## 2023-01-31 DIAGNOSIS — M459 Ankylosing spondylitis of unspecified sites in spine: Secondary | ICD-10-CM | POA: Diagnosis not present

## 2023-01-31 DIAGNOSIS — K5190 Ulcerative colitis, unspecified, without complications: Secondary | ICD-10-CM | POA: Diagnosis not present

## 2023-02-01 ENCOUNTER — Ambulatory Visit: Payer: Self-pay

## 2023-02-01 ENCOUNTER — Encounter: Payer: Self-pay | Admitting: Licensed Clinical Social Worker

## 2023-02-01 NOTE — Patient Instructions (Signed)
Visit Information  Thank you for taking time to visit with me today. Please don't hesitate to contact me if I can be of assistance to you.   Following are the goals we discussed today:   Goals Addressed             This Visit's Progress    To reduce stress and anxiety related to caregiver assistance       Care Coordination Interventions: Evaluation of current treatment plan related to caregiver stress and anxiety  and patient's adherence to plan as established by provider Discussed with patient she continues to experience stress and anxiety in her life to due to having multiple family issues  Active listening / Reflection utilized  Emotional Support Provided Educated patient about the availability of LCSW for counseling and patient would like to proceed with the referral  Collaborated with LCSW [name] regarding patient's reported stress and need for counseling, referral sent via in basket message          Our next appointment is by telephone on 04/03/23 at 1:15 PM  Please call the care guide team at [phone] if you need to cancel or reschedule your appointment.   If you are experiencing a Mental Health or Behavioral Health Crisis or need someone to talk to, please call 1-800-273-TALK (toll free, 24 hour hotline) go to [facility] [address], [place] [phone])  Patient verbalizes understanding of instructions and care plan provided today and agrees to view in MyChart. Active MyChart status and patient understanding of how to access instructions and care plan via MyChart confirmed with patient.     Delsa Sale, RN, BSN, CCM Care Management Coordinator [facility] Care Management  Direct Phone: [phone]

## 2023-02-01 NOTE — Patient Outreach (Signed)
  Care Coordination   Follow Up Visit Note   02/01/2023 Name: Gabrielle Hawkins MRN: [id number] DOB: 1965/12/02  Gabrielle Hawkins is a 57 y.o. year old female who sees Arnette Felts, FNP for primary care. I spoke with  [name] by phone today.  What matters to the patients health and wellness today?  Patient would like to speak with a LCSW for counseling due to having increased caregiver stress and anxiety.     Goals Addressed             This Visit's Progress    To reduce stress and anxiety related to caregiver assistance       Care Coordination Interventions: Evaluation of current treatment plan related to caregiver stress and anxiety  and patient's adherence to plan as established by provider Discussed with patient she continues to experience stress and anxiety in her life to due to having multiple family issues  Active listening / Reflection utilized  Emotional Support Provided Educated patient about the availability of LCSW for counseling and patient would like to proceed with the referral  Collaborated with LCSW [name] regarding patient's reported stress and need for counseling, referral sent via in basket message      Interventions Today    Flowsheet Row Most Recent Value  Chronic Disease   Chronic disease during today's visit Other  [allergies]  General Interventions   General Interventions Discussed/Reviewed General Interventions Discussed, General Interventions Reviewed, Communication with, Doctor Visits  Doctor Visits Discussed/Reviewed Doctor Visits Discussed, Doctor Visits Reviewed, PCP, Specialist  Communication with Social Work  [name] LCSW]  Education Interventions   Education Provided Provided Education  Provided Verbal Education On When to see the doctor, Medication  Mental Health Interventions   Mental Health Discussed/Reviewed Mental Health Discussed, Mental Health Reviewed, Coping Strategies, Anxiety, Depression  Pharmacy  Interventions   Pharmacy Dicussed/Reviewed Pharmacy Topics Reviewed, Pharmacy Topics Discussed, Medications and their functions          SDOH assessments and interventions completed:  No     Care Coordination Interventions:  Yes, provided   Follow up plan: Referral made to LCSW [name] to assist with counseling Follow up call scheduled for 03/27/23 @1 :15 PM    Encounter Outcome:  Pt. Visit Completed

## 2023-02-02 NOTE — Patient Outreach (Signed)
  Care Coordination   Collaborative  Visit Note   02/01/2023 Name: Gabrielle Hawkins MRN: [id number] DOB: 01/13/1966  Gabrielle Hawkins is a 57 y.o. year old female who sees Gabrielle Felts, FNP for primary care. I  spoke with RNCM  What matters to the patients health and wellness today?  Pt was not engaged during this encounter     SDOH assessments and interventions completed:  No     Care Coordination Interventions:  Yes, provided  Interventions Today    Flowsheet Row Most Recent Value  General Interventions   Communication with RN  [LCSW collaborated with Gabrielle Hawkins LP regarding patient care needs. LCSW messaged Care Guide to assist with scheduling initial appt]       Follow up plan:  LCSW messaged Care Guide to assist with scheduling initial appt    Encounter Outcome:  Pt. Visit Completed   Gabrielle Hawkins, MSW, LCSW Gabrielle Hawkins Care Management Gabrielle Hawkins [facility]  [facility] [email or web address] Phone [phone] 8:38 AM

## 2023-02-10 ENCOUNTER — Ambulatory Visit: Payer: 59 | Admitting: Allergy & Immunology

## 2023-02-10 ENCOUNTER — Other Ambulatory Visit: Payer: Self-pay

## 2023-02-10 ENCOUNTER — Encounter: Payer: Self-pay | Admitting: Allergy & Immunology

## 2023-02-10 VITALS — BP 126/96 | HR 86 | Temp 98.0°F | Resp 16 | Ht 67.0 in | Wt 203.0 lb

## 2023-02-10 DIAGNOSIS — J4530 Mild persistent asthma, uncomplicated: Secondary | ICD-10-CM | POA: Diagnosis not present

## 2023-02-10 DIAGNOSIS — J309 Allergic rhinitis, unspecified: Secondary | ICD-10-CM | POA: Diagnosis not present

## 2023-02-10 DIAGNOSIS — L2389 Allergic contact dermatitis due to other agents: Secondary | ICD-10-CM | POA: Diagnosis not present

## 2023-02-10 DIAGNOSIS — J302 Other seasonal allergic rhinitis: Secondary | ICD-10-CM

## 2023-02-10 NOTE — Progress Notes (Signed)
FOLLOW UP  Date of Service/Encounter:  02/10/23   Assessment:   Mild persistent asthma, uncomplicated    Perennial and seasonal allergic rhinitis (grasses, weeds, ragweed, trees, molds, cats, dogs) - on allergen immunotherapy   Contact dermatitis (thimerosal, adhesives)   Inflammatory bowel disease   History of breast cancer    Disabled status - previously worked with Dr. Everardo All and Dr. Lucianne Muss with Endocrinology   Favorite color of purple   Plan/Recommendations:   1. Mild persistent asthma, uncomplicated - Lung testing looks great.  - Daily controller medication(s): Breo 200/41mcg one puff once daily - Prior to physical activity: albuterol 2 puffs 10-15 minutes before physical activity. - Rescue medications: albuterol 4 puffs every 4-6 hours as needed - Asthma control goals:  * Full participation in all desired activities (may need albuterol before activity) * Albuterol use two time or less a week on average (not counting use with activity) * Cough interfering with sleep two time or less a month * Oral steroids no more than once a year * No hospitalizations  2. Chronic rhinitis - Previous testing showed: grasses, ragweed, weeds, trees, indoor molds, outdoor molds, dust mites, cat, and dog. - We are going to add epinephrine rinses to your shots.  - Add the Benadryl 1 or 2 tablets before your injections.  - Continue with: Xyzal (levocetirizine) 5mg  tablet once daily and Ryaltris (olopatadine/mometasone) two sprays per nostril 1-2 times daily as needed  3. Allergic contact dermatitis (thimerosal, adhesives) - Continue to avoid any triggering environmental triggers for your contact dermatitis.  4. Return in about 3 months (around 05/13/2023). You can have the follow up appointment with Dr. Dellis Anes or a Nurse Practicioner (our Nurse Practitioners are excellent and always have Physician oversight!).   Subjective:   Gabrielle Hawkins is a 57 y.o. female presenting  today for follow up of  Chief Complaint  Patient presents with   Follow-up    Has been having localized reactions to her injections. Swelling that lasted 2 days. Itching     Gabrielle Hawkins has a history of the following: Patient Active Problem List   Diagnosis Date Noted   Allergic contact dermatitis due to chemical 09/28/2022   Right-sided low back pain with right-sided sciatica 05/06/2019   Right sided numbness 03/27/2019   Paresthesia 12/25/2018   Essential hypertension 06/26/2018   Abdominal pain, chronic, right lower quadrant 01/06/2015   Nausea and vomiting 01/05/2015   Ulcerative colitis (HCC) 01/05/2015   Enteritis    Nausea with vomiting    Intractable nausea and vomiting 04/29/2014   Benign paroxysmal positional vertigo 03/14/2013   Lymphedema of arm 12/30/2011    History obtained from: chart review and patient.  Gabrielle Hawkins is a 57 y.o. female presenting for a follow up visit. She was last seen in January 2024 for her last patch testing visit. She ended up being positive to thimerosal and adhesives. She also started allergy shots in March 2024 and has had some problems with large local reactions that persist for a few days.   Asthma/Respiratory Symptom History: Asthma has been well controlled. She is using her albuterol on a PRN basis. She has Breo that she has been using with good results. She has not been on prednisone and has not had any problems with shortness of breath or coughing.   Allergic Rhinitis Symptom History: She is on the Ryaltris. She has  swelling with Allegra. Ryaltris is $49. She does not use it some days, so it lasts  a while. It all depends on how long she is outside.    [name] is on allergen immunotherapy. She receives two injections. Immunotherapy script #1 contains  ragweed and molds. She currently receives 0.31mL of the GOLD vial (1/10,000). Immunotherapy script #2 contains trees, weeds, grasses, cat, and dog. She currently receives 0.4mL of  the GOLD vial (1/10,000). She started shots February of 2024 and not yet reached maintenance.   She did have a large local reaction on a couple of occasions. She is strongly considering stopping because of this.  Her mother was recently diagnosed with breast cancer. She was started on chemotherapy daily and this has been rough for her.  Otherwise, there have been no changes to her past medical history, surgical history, family history, or social history.    Review of Systems  Constitutional: Negative.  Negative for fever, malaise/fatigue and weight loss.  HENT: Negative.  Negative for congestion, ear discharge and ear pain.   Eyes:  Negative for pain, discharge and redness.  Respiratory:  Negative for cough, sputum production, shortness of breath and wheezing.   Cardiovascular: Negative.  Negative for chest pain and palpitations.  Gastrointestinal:  Negative for abdominal pain, constipation, diarrhea, heartburn, nausea and vomiting.  Skin:  Positive for itching and rash.  Neurological:  Negative for dizziness and headaches.  Endo/Heme/Allergies:  Positive for environmental allergies. Negative for polydipsia. Does not bruise/bleed easily.       Objective:   Blood pressure (!) 126/96, pulse 86, temperature 98 F (36.7 C), resp. rate 16, height 5\' 7"  (1.702 m), weight 203 lb (92.1 kg), SpO2 99 %. Body mass index is 31.79 kg/m.    Physical Exam Vitals reviewed.  Constitutional:      Appearance: She is well-developed.     Comments: Pleasant. Talkative.   HENT:     Head: Normocephalic and atraumatic.     Right Ear: Tympanic membrane, ear canal and external ear normal. No drainage, swelling or tenderness. Tympanic membrane is not injected, scarred, erythematous, retracted or bulging.     Left Ear: Tympanic membrane, ear canal and external ear normal. No drainage, swelling or tenderness. Tympanic membrane is not injected, scarred, erythematous, retracted or bulging.     Nose: No  nasal deformity, septal deviation, mucosal edema or rhinorrhea.     Right Turbinates: Enlarged, swollen and pale.     Left Turbinates: Enlarged, swollen and pale.     Right Sinus: No maxillary sinus tenderness or frontal sinus tenderness.     Left Sinus: No maxillary sinus tenderness or frontal sinus tenderness.     Mouth/Throat:     Mouth: Mucous membranes are not pale and not dry.     Pharynx: Uvula midline.  Eyes:     General: Lids are normal. Allergic shiner present.        Right eye: No discharge.        Left eye: No discharge.     Conjunctiva/sclera: Conjunctivae normal.     Right eye: Right conjunctiva is not injected. No chemosis.    Left eye: Left conjunctiva is not injected. No chemosis.    Pupils: Pupils are equal, round, and reactive to light.  Cardiovascular:     Rate and Rhythm: Normal rate and regular rhythm.     Heart sounds: Normal heart sounds.  Pulmonary:     Effort: Pulmonary effort is normal. No tachypnea, accessory muscle usage or respiratory distress.     Breath sounds: Normal breath sounds. No wheezing, rhonchi or rales.  Chest:     Chest wall: No tenderness.  Abdominal:     Tenderness: There is no abdominal tenderness. There is no guarding or rebound.  Lymphadenopathy:     Head:     Right side of head: No submandibular, tonsillar or occipital adenopathy.     Left side of head: No submandibular, tonsillar or occipital adenopathy.     Cervical: No cervical adenopathy.  Skin:    Coloration: Skin is not pale.     Findings: No abrasion, erythema, petechiae or rash. Rash is not papular, urticarial or vesicular.  Neurological:     Mental Status: She is alert.  Psychiatric:        Behavior: Behavior is cooperative.      Diagnostic studies:    Spirometry: results normal (FEV1: 2.17/87%, FVC: 2.52/80%, FEV1/FVC: 86%).    Spirometry consistent with normal pattern.    Allergy Studies: none       Malachi Bonds, MD  [facility]

## 2023-02-10 NOTE — Progress Notes (Signed)
Gabrielle Hawkins spoke with patient Scheduled for 02/14/23.    [facility]  Care Coordination Care Guide  Direct Dial: [phone]

## 2023-02-10 NOTE — Patient Instructions (Addendum)
1. Mild persistent asthma, uncomplicated - Lung testing looks great.  - Daily controller medication(s): Breo 200/66mcg one puff once daily - Prior to physical activity: albuterol 2 puffs 10-15 minutes before physical activity. - Rescue medications: albuterol 4 puffs every 4-6 hours as needed - Asthma control goals:  * Full participation in all desired activities (may need albuterol before activity) * Albuterol use two time or less a week on average (not counting use with activity) * Cough interfering with sleep two time or less a month * Oral steroids no more than once a year * No hospitalizations  2. Chronic rhinitis - Previous testing showed: grasses, ragweed, weeds, trees, indoor molds, outdoor molds, dust mites, cat, and dog. - We are going to add epinephrine rinses to your shots.  - Add the Benadryl 1 or 2 tablets before your injections.  - Continue with: Xyzal (levocetirizine) 5mg  tablet once daily and Ryaltris (olopatadine/mometasone) two sprays per nostril 1-2 times daily as needed  3. Allergic contact dermatitis (thimerosal, adhesives) - Continue to avoid any triggering environmental triggers for your contact dermatitis.  4. Return in about 3 months (around 05/13/2023). You can have the follow up appointment with Dr. Dellis Anes or a Nurse Practicioner (our Nurse Practitioners are excellent and always have Physician oversight!).    Please inform us of any Emergency Department visits, hospitalizations, or changes in symptoms. Call us before going to the ED for breathing or allergy symptoms since we might be able to fit you in for a sick visit. Feel free to contact us anytime with any questions, problems, or concerns.  It was a pleasure to see you again today!  Websites that have reliable patient information: 1. American Academy of Asthma, Allergy, and Immunology: www.aaaai.org 2. Food Allergy Research and Education (FARE): foodallergy.org 3. Mothers of Asthmatics:  http://www.asthmacommunitynetwork.org 4. American College of Allergy, Asthma, and Immunology: www.acaai.org   COVID-19 Vaccine Information can be found at: [email or web address].[email or web address] For questions related to vaccine distribution or appointments, please email [email or web address] or call [phone].   We realize that you might be concerned about having an allergic reaction to the COVID19 vaccines. To help with that concern, WE ARE OFFERING THE COVID19 VACCINES IN OUR OFFICE! Ask the front desk for dates!     "Like" [place] on Facebook and Instagram for our latest updates!      A healthy democracy works best when [employer] participate! Make sure you are registered to vote! If you have moved or changed any of your contact information, you will need to get this updated before voting!  In some cases, you MAY be able to register to vote online: [email or web address]

## 2023-02-14 ENCOUNTER — Ambulatory Visit: Payer: Self-pay | Admitting: Licensed Clinical Social Worker

## 2023-02-16 NOTE — Patient Outreach (Signed)
  Care Coordination   Initial Visit Note   02/14/2023 Name: Gabrielle Hawkins MRN: [id number] DOB: 10-08-1965  Gabrielle Hawkins is a 57 y.o. year old female who sees Arnette Felts, FNP for primary care. I spoke with  [name] by phone today.  What matters to the patients health and wellness today?  Caregiver Fatigue    Goals Addressed             This Visit's Progress    Obtain Supportive Resources-Caregiver Fatigue   On track    Activities and task to complete in order to accomplish goals.   Keep all upcoming appointments discussed today Continue with compliance of taking medication prescribed by Doctor Implement healthy coping skills discussed to assist with management of symptoms Continue working with [facility] care team to assist with goals identified         SDOH assessments and interventions completed:  Yes  SDOH Interventions Today    Flowsheet Row Most Recent Value  SDOH Interventions   Food Insecurity Interventions Intervention Not Indicated        Care Coordination Interventions:  Yes, provided  Interventions Today    Flowsheet Row Most Recent Value  Chronic Disease   Chronic disease during today's visit Hypertension (HTN)  General Interventions   General Interventions Discussed/Reviewed General Interventions Discussed, [facility], Level of Care, Doctor Visits  [LCSW introduced self and explained role in care coordination. Pt successfully identified triggers to stress and barriers to management of symptoms]  Doctor Visits Discussed/Reviewed Doctor Visits Discussed  Level of Care Assisted Living, Skilled Nursing Facility  [LCSW discussed levels of care for pt's elderly aunt]  Mental Health Interventions   Mental Health Discussed/Reviewed Mental Health Discussed, Coping Strategies, Anxiety, Depression, Grief and Loss  [Caregiver Strain]  Nutrition Interventions   Nutrition Discussed/Reviewed Nutrition Discussed  Pharmacy Interventions    Pharmacy Dicussed/Reviewed Pharmacy Topics Discussed, Medication Adherence  [No barriers in obtaining medications]  Safety Interventions   Safety Discussed/Reviewed Safety Discussed       Follow up plan: Follow up call scheduled for 2-4 weeks    Encounter Outcome:  Pt. Visit Completed   [name], MSW, LCSW [facility] Care Management [facility]  [facility] [email or web address] Phone [phone] 1:33 PM

## 2023-02-16 NOTE — Patient Instructions (Signed)
Visit Information  Thank you for taking time to visit with me today. Please don't hesitate to contact me if I can be of assistance to you.   Following are the goals we discussed today:   Goals Addressed             This Visit's Progress    Obtain Supportive Resources-Caregiver Fatigue   On track    Activities and task to complete in order to accomplish goals.   Keep all upcoming appointments discussed today Continue with compliance of taking medication prescribed by Doctor Implement healthy coping skills discussed to assist with management of symptoms Continue working with [facility] care team to assist with goals identified         Our next appointment is by telephone on 7/15 at 10:30 AM  Please call the care guide team at [phone] if you need to cancel or reschedule your appointment.   If you are experiencing a Mental Health or Behavioral Health Crisis or need someone to talk to, please call the Suicide and Crisis Lifeline: 988 call 911   Patient verbalizes understanding of instructions and care plan provided today and agrees to view in MyChart. Active MyChart status and patient understanding of how to access instructions and care plan via MyChart confirmed with patient.     [name], MSW, LCSW [facility] Care Management [place]  [facility] [email or web address] Phone [phone] 1:34 PM

## 2023-02-20 DIAGNOSIS — M25532 Pain in left wrist: Secondary | ICD-10-CM | POA: Diagnosis not present

## 2023-02-20 DIAGNOSIS — K5100 Ulcerative (chronic) pancolitis without complications: Secondary | ICD-10-CM | POA: Diagnosis not present

## 2023-02-20 DIAGNOSIS — Z79899 Other long term (current) drug therapy: Secondary | ICD-10-CM | POA: Diagnosis not present

## 2023-02-20 DIAGNOSIS — M25571 Pain in right ankle and joints of right foot: Secondary | ICD-10-CM | POA: Diagnosis not present

## 2023-02-20 DIAGNOSIS — M25531 Pain in right wrist: Secondary | ICD-10-CM | POA: Diagnosis not present

## 2023-02-20 DIAGNOSIS — M459 Ankylosing spondylitis of unspecified sites in spine: Secondary | ICD-10-CM | POA: Diagnosis not present

## 2023-02-20 DIAGNOSIS — M25572 Pain in left ankle and joints of left foot: Secondary | ICD-10-CM | POA: Diagnosis not present

## 2023-03-20 ENCOUNTER — Ambulatory Visit: Payer: Self-pay | Admitting: Licensed Clinical Social Worker

## 2023-03-20 NOTE — Patient Instructions (Signed)
Visit Information  Thank you for taking time to visit with me today. Please don't hesitate to contact me if I can be of assistance to you.   Following are the goals we discussed today:   Goals Addressed             This Visit's Progress    Obtain Supportive Resources-Caregiver Fatigue   On track    Activities and task to complete in order to accomplish goals.   Keep all upcoming appointments discussed today Continue with compliance of taking medication prescribed by Doctor Implement healthy coping skills discussed to assist with management of symptoms Continue working with [facility] care team to assist with goals identified         Our next appointment is by telephone on 08/05 at 11 AM  Please call the care guide team at [phone] if you need to cancel or reschedule your appointment.   If you are experiencing a Mental Health or Behavioral Health Crisis or need someone to talk to, please call the Suicide and Crisis Lifeline: 988 call 911   Patient verbalizes understanding of instructions and care plan provided today and agrees to view in MyChart. Active MyChart status and patient understanding of how to access instructions and care plan via MyChart confirmed with patient.     [name], MSW, LCSW [facility] Care Management [place]  [facility] [email or web address] Phone [phone] 11:50 AM

## 2023-03-20 NOTE — Patient Outreach (Signed)
  Care Coordination   Follow Up Visit Note   03/20/2023 Name: Gabrielle Hawkins MRN: [id number] DOB: Jan 19, 1966  Gabrielle Hawkins is a 57 y.o. year old female who sees Arnette Felts, FNP for primary care. I spoke with  [name] by phone today.  What matters to the patients health and wellness today?  Self-care and weight loss    Goals Addressed             This Visit's Progress    Obtain Supportive Resources-Caregiver Fatigue   On track    Activities and task to complete in order to accomplish goals.   Keep all upcoming appointments discussed today Continue with compliance of taking medication prescribed by Doctor Implement healthy coping skills discussed to assist with management of symptoms Continue working with [facility] care team to assist with goals identified         SDOH assessments and interventions completed:  No     Care Coordination Interventions:  Yes, provided  Interventions Today    Flowsheet Row Most Recent Value  Chronic Disease   Chronic disease during today's visit Hypertension (HTN)  General Interventions   General Interventions Discussed/Reviewed General Interventions Reviewed, Doctor Visits, Communication with  Doctor Visits Discussed/Reviewed Doctor Visits Reviewed  Communication with RN  [LCSW collaborated with [facility] regarding upcoming appt]  Exercise Interventions   Exercise Discussed/Reviewed Weight Managment  [Pt identified goal regarding weight loss. SMART goals discussed]  Education Interventions   Education Provided Provided Web-based Education  [Self-Care and SMART goals]  Nutrition Interventions   Nutrition Discussed/Reviewed Nutrition Reviewed  [LCSW informed pt of [facility]. Pt will think it over and request referral from pcp if interested]  Pharmacy Interventions   Pharmacy Dicussed/Reviewed Pharmacy Topics Reviewed       Follow up plan: Follow up call scheduled for 3 weeks    Encounter Outcome:  Pt. Visit  Completed   [name], MSW, LCSW [facility] Care Management [facility]  [facility] [email or web address] Phone [phone] 11:49 AM

## 2023-03-26 ENCOUNTER — Other Ambulatory Visit: Payer: Self-pay | Admitting: Allergy & Immunology

## 2023-03-28 DIAGNOSIS — M459 Ankylosing spondylitis of unspecified sites in spine: Secondary | ICD-10-CM | POA: Diagnosis not present

## 2023-03-28 DIAGNOSIS — K5190 Ulcerative colitis, unspecified, without complications: Secondary | ICD-10-CM | POA: Diagnosis not present

## 2023-04-03 ENCOUNTER — Ambulatory Visit: Payer: Self-pay

## 2023-04-03 NOTE — Patient Outreach (Signed)
  Care Coordination   04/03/2023 Name: Gabrielle Hawkins MRN: [id number] DOB: May 03, 1966   Care Coordination Outreach Attempts:  An unsuccessful telephone outreach was attempted for a scheduled appointment today.  Follow Up Plan:  Additional outreach attempts will be made to offer the patient care coordination information and services.   Encounter Outcome:  Pt. Request to Call Back   Care Coordination Interventions:  No, not indicated    Delsa Sale, RN, BSN, CCM Care Management Coordinator [facility] Care Management  Direct Phone: [phone]

## 2023-04-10 ENCOUNTER — Ambulatory Visit: Payer: Self-pay | Admitting: Licensed Clinical Social Worker

## 2023-04-12 NOTE — Patient Instructions (Signed)
Visit Information  Thank you for taking time to visit with me today. Please don't hesitate to contact me if I can be of assistance to you.   Following are the goals we discussed today:   Goals Addressed             This Visit's Progress    Obtain Supportive Resources-Caregiver Fatigue   On track    Activities and task to complete in order to accomplish goals.   Keep all upcoming appointments discussed today Continue with compliance of taking medication prescribed by Doctor Implement healthy coping skills discussed to assist with management of symptoms Continue working with [facility] care team to assist with goals identified         Our next appointment is by telephone on 08/09  Please call the care guide team at [phone] if you need to cancel or reschedule your appointment.   If you are experiencing a Mental Health or Behavioral Health Crisis or need someone to talk to, please call the Suicide and Crisis Lifeline: 988 call 911   Patient verbalizes understanding of instructions and care plan provided today and agrees to view in MyChart. Active MyChart status and patient understanding of how to access instructions and care plan via MyChart confirmed with patient.     [name], MSW, LCSW [facility] Care Management [place]  [facility] [email or web address] Phone [phone] 11:23 PM

## 2023-04-12 NOTE — Patient Outreach (Signed)
  Care Coordination   Follow Up Visit Note   04/10/2023 Name: Gabrielle Hawkins MRN: [id number] DOB: [birth year]  Gabrielle Hawkins is a 58 y.o. year old female who sees Arnette Felts, FNP for primary care. I spoke with  [name] by phone today.  What matters to the patients health and wellness today?  Patient requested to r/s appt    Goals Addressed             This Visit's Progress    Obtain Supportive Resources-Caregiver Fatigue   On track    Activities and task to complete in order to accomplish goals.   Keep all upcoming appointments discussed today Continue with compliance of taking medication prescribed by Doctor Implement healthy coping skills discussed to assist with management of symptoms Continue working with [facility] care team to assist with goals identified         SDOH assessments and interventions completed:  No     Care Coordination Interventions:  Yes, provided  Interventions Today    Flowsheet Row Most Recent Value  General Interventions   General Interventions Discussed/Reviewed General Interventions Reviewed       Follow up plan: Follow up call scheduled for 08/09    Encounter Outcome:  Pt. Scheduled   [name], MSW, LCSW [facility] Care Management [facility] Health  [facility] [email or web address] Phone [phone] 11:23 PM

## 2023-04-14 ENCOUNTER — Ambulatory Visit: Payer: Self-pay

## 2023-04-14 NOTE — Patient Outreach (Signed)
  Care Coordination   04/14/2023 Name: Gabrielle Hawkins MRN: [id number] DOB: Feb 18, 1966   Care Coordination Outreach Attempts:  An unsuccessful telephone outreach was attempted for a scheduled appointment today.  Follow Up Plan:  Additional outreach attempts will be made to offer the patient care coordination information and services.   Encounter Outcome:  No Answer   Care Coordination Interventions:  No, not indicated    Delsa Sale, RN, BSN, CCM Care Management Coordinator [facility] Care Management  Direct Phone: [phone]

## 2023-04-17 ENCOUNTER — Encounter: Payer: Self-pay | Admitting: Licensed Clinical Social Worker

## 2023-04-19 ENCOUNTER — Telehealth: Payer: Self-pay | Admitting: Licensed Clinical Social Worker

## 2023-04-19 NOTE — Patient Outreach (Signed)
  Care Coordination   04/17/2023 Name: Gabrielle Hawkins MRN: [id number] DOB: 03/07/1966   Care Coordination Outreach Attempts:  An unsuccessful telephone outreach was attempted for a scheduled appointment today.  Follow Up Plan:  Additional outreach attempts will be made to offer the patient care coordination information and services.   Encounter Outcome:  No Answer   Care Coordination Interventions:  No, not indicated    [name], MSW, LCSW [facility] Care Management [place]  [facility] [email or web address] Phone [phone] 8:39 AM

## 2023-05-06 ENCOUNTER — Other Ambulatory Visit: Payer: Self-pay | Admitting: Nurse Practitioner

## 2023-05-06 DIAGNOSIS — E559 Vitamin D deficiency, unspecified: Secondary | ICD-10-CM

## 2023-05-12 ENCOUNTER — Ambulatory Visit: Payer: 59 | Admitting: Allergy & Immunology

## 2023-05-16 ENCOUNTER — Encounter (INDEPENDENT_AMBULATORY_CARE_PROVIDER_SITE_OTHER): Payer: Self-pay

## 2023-05-22 ENCOUNTER — Encounter: Payer: Self-pay | Admitting: Nurse Practitioner

## 2023-05-23 ENCOUNTER — Other Ambulatory Visit: Payer: Self-pay | Admitting: Nurse Practitioner

## 2023-05-23 DIAGNOSIS — K5190 Ulcerative colitis, unspecified, without complications: Secondary | ICD-10-CM | POA: Diagnosis not present

## 2023-05-23 DIAGNOSIS — G4700 Insomnia, unspecified: Secondary | ICD-10-CM

## 2023-05-23 DIAGNOSIS — R5383 Other fatigue: Secondary | ICD-10-CM | POA: Diagnosis not present

## 2023-05-23 DIAGNOSIS — M459 Ankylosing spondylitis of unspecified sites in spine: Secondary | ICD-10-CM | POA: Diagnosis not present

## 2023-05-23 MED ORDER — ESZOPICLONE 2 MG PO TABS: ORAL_TABLET | ORAL | 2 refills | Status: DC

## 2023-05-29 NOTE — Progress Notes (Signed)
Gabrielle Hawkins, CMA,acting as a Neurosurgeon for Gabrielle Felts, FNP.,have documented all relevant documentation on the behalf of Gabrielle Felts, FNP,as directed by  Gabrielle Felts, FNP while in the presence of Gabrielle Felts, FNP.  Subjective:  Patient ID: Gabrielle Hawkins , female    DOB: [birth year] , 57 y.o.   MRN: [id number]  Chief Complaint  Patient presents with   Hypertension    HPI  Patient presents today for BP. Patient reports compliance with medication. Patient denies any chest pain, SOB, or headaches. Patient has no concerns today. Patient reports she had hysterectomy in 2007. She is caring for her parents her father has leukemia mother with breast cancer. She is also concerned about her aunt who is under the care of her other aunt.   BP Readings from Last 3 Encounters: 05/30/23 : 124/60 02/10/23 : (!) 126/96 09/26/22 : 128/86       Past Medical History:  Diagnosis Date   Anemia 1998   Anxiety    Arthritis    big toe    Asthma    BRCA1 negative    BRCA2 negative    Breast cancer (HCC) 2006   chem/radiation/2years tamoxifen   Chronic neck and back pain    Crohn's disease (HCC)    Depression    Endometriosis    Fibroid    GERD (gastroesophageal reflux disease)    History of chemotherapy 01/2005   Hx of tamoxifen therapy    Hx of transfusion of packed red blood cells    Hypertension    IBS (irritable bowel syndrome)    Kidney stones    Lymphedema of arm    Migraines    just at dx of breast CA   Mucinous cystadenoma of ovary    left   Neuropathy    Paresthesias    Peripheral neuropathy    Personal history of chemotherapy    Personal history of radiation therapy    Radiation 04/2005   Ulcerative colitis (HCC)    with chronic diarrhea   Urinary tract infection    x 3   Vertigo 2014   Vitamin D deficiency      Family History  Problem Relation Age of Onset   Breast cancer Mother 2   Heart failure Father    Heart disease Father    Cancer Maternal  Grandmother        COLON   Heart failure Paternal Grandmother    Heart disease Paternal Grandmother      Current Outpatient Medications:    amitriptyline (ELAVIL) 50 MG tablet, Take 1 tablet (50 mg total) by mouth at bedtime., Disp: 90 tablet, Rfl: 1   atorvastatin (LIPITOR) 20 MG tablet, Take 1 tablet (20 mg total) by mouth daily., Disp: 30 tablet, Rfl: 11   BREO ELLIPTA 200-25 MCG/ACT AEPB, Inhale into the lungs., Disp: , Rfl:    busPIRone (BUSPAR) 5 MG tablet, Take 1 tablet (5 mg total) by mouth at bedtime., Disp: 90 tablet, Rfl: 1   EPINEPHrine (EPIPEN 2-PAK) 0.3 mg/0.3 mL IJ SOAJ injection, Inject 0.3 mg into the muscle as needed for anaphylaxis., Disp: 1 each, Rfl: 2   eszopiclone (LUNESTA) 2 MG TABS tablet, Take immediately before bedtime, Disp: 30 tablet, Rfl: 2   folic acid (FOLVITE) 1 MG tablet, Take 3 mg by mouth daily., Disp: , Rfl:    golimumab (SIMPONI ARIA) 50 MG/4ML SOLN injection, 50 mg See admin instructions. Infusion every 8 weeks, [facility], Disp: , Rfl:  levocetirizine (XYZAL) 5 MG tablet, TAKE ONE TABLET BY MOUTH EVERY EVENING, Disp: 30 tablet, Rfl: 5   methotrexate (50 MG/ML) 1 g injection, Inject into the vein every Monday. 0.34ml every Monday, Disp: , Rfl:    Multiple Vitamins-Minerals (ONE A DAY WOMEN 50 PLUS) CHEW, Chew 1 tablet by mouth daily., Disp: , Rfl:    omeprazole (PRILOSEC) 40 MG capsule, Take 1 capsule (40 mg total) by mouth daily., Disp: 30 capsule, Rfl: 3   OVER THE COUNTER MEDICATION, Aleve cream for pain, Disp: , Rfl:    tiZANidine (ZANAFLEX) 2 MG tablet, Take 2 mg by mouth 3 (three) times daily., Disp: , Rfl:    albuterol (VENTOLIN HFA) 108 (90 Base) MCG/ACT inhaler, Inhale 2 puffs into the lungs every 6 (six) hours as needed for wheezing or shortness of breath., Disp: 8.5 g, Rfl: 2   olmesartan (BENICAR) 20 MG tablet, Take 1 tablet (20 mg total) by mouth daily., Disp: 90 tablet, Rfl: 1   Vitamin D, Ergocalciferol, (DRISDOL) 1.25 MG  (50000 UNIT) CAPS capsule, Take 1 capsule (50,000 Units total) by mouth every 7 (seven) days., Disp: 24 capsule, Rfl: 2   Allergies  Allergen Reactions   Humira [Adalimumab] Anaphylaxis   Ampicillin Hives and Itching   Codeine    Compazine [Prochlorperazine Maleate] Other (See Comments)    "Stroke-like symptoms" Can tolerate Phenergan.   Cymbalta [Duloxetine Hcl] Other (See Comments)    Fainting   Imitrex [Sumatriptan Base] Hives and Nausea And Vomiting   Lyrica [Pregabalin] Other (See Comments)    Fainting   Metoclopramide Hcl Hives   Percocet [Oxycodone-Acetaminophen] Itching   Effexor [Venlafaxine Hydrochloride] Hives and Palpitations   Fentanyl Nausea And Vomiting   Tape Itching and Rash     Review of Systems  Constitutional: Negative.   HENT: Negative.    Eyes: Negative.   Respiratory: Negative.    Cardiovascular: Negative.   Gastrointestinal: Negative.   Neurological: Negative.   Psychiatric/Behavioral: Negative.       Today's Vitals   05/30/23 1427  BP: 124/60  Pulse: 85  Temp: 98.4 F (36.9 C)  TempSrc: Oral  Weight: 199 lb 12.8 oz (90.6 kg)  Height: 5\' 7"  (1.702 m)  PainSc: 0-No pain   Body mass index is 31.29 kg/m.  Wt Readings from Last 3 Encounters:  05/30/23 199 lb 12.8 oz (90.6 kg)  02/10/23 203 lb (92.1 kg)  09/26/22 200 lb (90.7 kg)      Objective:  Physical Exam Vitals reviewed.  Constitutional:      General: She is not in acute distress.    Appearance: Normal appearance. She is obese.  Eyes:     Pupils: Pupils are equal, round, and reactive to light.  Cardiovascular:     Rate and Rhythm: Normal rate and regular rhythm.     Pulses: Normal pulses.     Heart sounds: Normal heart sounds. No murmur heard. Pulmonary:     Effort: Pulmonary effort is normal. No respiratory distress.     Breath sounds: Normal breath sounds. No wheezing.  Skin:    General: Skin is warm and dry.     Capillary Refill: Capillary refill takes less than 2  seconds.  Neurological:     General: No focal deficit present.     Mental Status: She is alert and oriented to person, place, and time.     Cranial Nerves: No cranial nerve deficit.     Motor: No weakness.  Psychiatric:  Mood and Affect: Mood normal.        Behavior: Behavior normal.        Thought Content: Thought content normal.        Judgment: Judgment normal.         Assessment And Plan:  Essential hypertension Assessment & Plan: Blood pressure is well controlled, continue current medications  Orders: -     Olmesartan Medoxomil; Take 1 tablet (20 mg total) by mouth daily.  Dispense: 90 tablet; Refill: 1  Vitamin D deficiency Assessment & Plan: Will check vitamin D level and supplement as needed.    Also encouraged to spend 15 minutes in the sun daily.    Orders: -     Vitamin D (Ergocalciferol); Take 1 capsule (50,000 Units total) by mouth every 7 (seven) days.  Dispense: 24 capsule; Refill: 2  Ulcerative colitis with complication, unspecified location [facility]) Assessment & Plan: No recent episodes, overall doing well   Need for influenza vaccination Assessment & Plan: Influenza vaccine administered Encouraged to take Tylenol as needed for fever or muscle aches.   Orders: -     Flu vaccine trivalent PF, 6mos and older(Flulaval,Afluria,Fluarix,Fluzone)  Herpes zoster vaccination declined Assessment & Plan: Declines shingrix, educated on disease process and is aware if he changes his mind to notify office    Other orders -     Albuterol Sulfate HFA; Inhale 2 puffs into the lungs every 6 (six) hours as needed for wheezing or shortness of breath.  Dispense: 8.5 g; Refill: 2    Return for keep same next.  Patient was given opportunity to ask questions. Patient verbalized understanding of the plan and was able to repeat key elements of the plan. All questions were answered to their satisfaction.    Jeanell Sparrow, FNP, have reviewed all documentation  for this visit. The documentation on 05/30/23 for the exam, diagnosis, procedures, and orders are all accurate and complete.   IF YOU HAVE BEEN REFERRED TO A SPECIALIST, IT MAY TAKE 1-2 WEEKS TO SCHEDULE/PROCESS THE REFERRAL. IF YOU HAVE NOT HEARD FROM US/SPECIALIST IN TWO WEEKS, PLEASE GIVE [place] A CALL AT [phone].

## 2023-05-30 ENCOUNTER — Encounter: Payer: Self-pay | Admitting: Nurse Practitioner

## 2023-05-30 ENCOUNTER — Ambulatory Visit (INDEPENDENT_AMBULATORY_CARE_PROVIDER_SITE_OTHER): Payer: 59 | Admitting: Nurse Practitioner

## 2023-05-30 VITALS — BP 124/60 | HR 85 | Temp 98.4°F | Ht 67.0 in | Wt 199.8 lb

## 2023-05-30 DIAGNOSIS — E559 Vitamin D deficiency, unspecified: Secondary | ICD-10-CM | POA: Diagnosis not present

## 2023-05-30 DIAGNOSIS — I10 Essential (primary) hypertension: Secondary | ICD-10-CM

## 2023-05-30 DIAGNOSIS — K51919 Ulcerative colitis, unspecified with unspecified complications: Secondary | ICD-10-CM

## 2023-05-30 DIAGNOSIS — Z2821 Immunization not carried out because of patient refusal: Secondary | ICD-10-CM | POA: Diagnosis not present

## 2023-05-30 DIAGNOSIS — Z23 Encounter for immunization: Secondary | ICD-10-CM

## 2023-05-30 MED ORDER — ALBUTEROL SULFATE HFA 108 (90 BASE) MCG/ACT IN AERS: 2.0000 | INHALATION_SPRAY | Freq: Four times a day (QID) | RESPIRATORY_TRACT | 2 refills | Status: AC | PRN

## 2023-05-30 MED ORDER — VITAMIN D (ERGOCALCIFEROL) 1.25 MG (50000 UNIT) PO CAPS: 50000.0000 [IU] | ORAL_CAPSULE | ORAL | 2 refills | Status: DC

## 2023-05-30 MED ORDER — OLMESARTAN MEDOXOMIL 20 MG PO TABS: 20.0000 mg | ORAL_TABLET | Freq: Every day | ORAL | 1 refills | Status: DC

## 2023-06-05 ENCOUNTER — Ambulatory Visit: Payer: Self-pay

## 2023-06-05 ENCOUNTER — Ambulatory Visit: Payer: Self-pay | Admitting: Licensed Clinical Social Worker

## 2023-06-05 NOTE — Patient Outreach (Signed)
Care Coordination   Follow Up Visit Note   06/05/2023 Name: Gabrielle Hawkins MRN: [id number] DOB: 18-Sep-1965  Gabrielle Hawkins is a 57 y.o. year old female who sees Arnette Felts, FNP for primary care. I spoke with  [name] by phone today.  What matters to the patients health and wellness today?  Symptom Management/Caregiver Fatigue    Goals Addressed             This Visit's Progress    Obtain Supportive Resources-Caregiver Fatigue   On track    Activities and task to complete in order to accomplish goals.   Keep all upcoming appointments discussed today Continue with compliance of taking medication prescribed by Doctor Implement healthy coping skills discussed to assist with management of symptoms Continue working with [facility] care team to assist with goals identified Continue to collaborate with DSS regarding loved one's LTC MA         SDOH assessments and interventions completed:  No     Care Coordination Interventions:  Yes, provided  Interventions Today    Flowsheet Row Most Recent Value  Chronic Disease   Chronic disease during today's visit Hypertension (HTN)  General Interventions   General Interventions Discussed/Reviewed General Interventions Reviewed, [facility], Doctor Visits  Doctor Visits Discussed/Reviewed Doctor Visits Reviewed  Mental Health Interventions   Mental Health Discussed/Reviewed Mental Health Reviewed, Coping Strategies, Anxiety, Depression  [LCSW processed pt's feeling of guilt regarding placement of loved one. Strategies to assist discussed. Pt is practicing self-care and has an upcoming vacation scheduled]  Nutrition Interventions   Nutrition Discussed/Reviewed Nutrition Reviewed  Pharmacy Interventions   Pharmacy Dicussed/Reviewed Pharmacy Topics Reviewed, Medication Adherence       Follow up plan: Follow up call scheduled for 6-8 weeks    Encounter Outcome:  Patient Visit Completed   [name], MSW,  LCSW [facility] Care Management [facility]  [facility] [email or web address] Phone [phone] 11:59 AM

## 2023-06-05 NOTE — Patient Instructions (Signed)
Visit Information  Thank you for taking time to visit with me today. Please don't hesitate to contact me if I can be of assistance to you.   Following are the goals we discussed today:   Goals Addressed             This Visit's Progress    Obtain Supportive Resources-Caregiver Fatigue   On track    Activities and task to complete in order to accomplish goals.   Keep all upcoming appointments discussed today Continue with compliance of taking medication prescribed by Doctor Implement healthy coping skills discussed to assist with management of symptoms Continue working with [facility] care team to assist with goals identified Continue to collaborate with DSS regarding loved one's LTC MA         Our next appointment is by telephone on 11/25 at 10:30 AM  Please call the care guide team at [phone] if you need to cancel or reschedule your appointment.   If you are experiencing a Mental Health or Behavioral Health Crisis or need someone to talk to, please call the Suicide and Crisis Lifeline: 988 call 911   Patient verbalizes understanding of instructions and care plan provided today and agrees to view in MyChart. Active MyChart status and patient understanding of how to access instructions and care plan via MyChart confirmed with patient.     [name], MSW, LCSW [facility] Care Management [place]  [facility] [email or web address] Phone [phone] 12:00 PM

## 2023-06-06 NOTE — Patient Outreach (Signed)
Care Coordination   Follow Up Visit Note   06/05/2023 Name: Gabrielle Hawkins MRN: [id number] DOB: [birth year]  Gabrielle Hawkins is a 57 y.o. year old female who sees Arnette Felts, FNP for primary care. I spoke with  [name] by phone today.  What matters to the patients health and wellness today?  Patient would like to get assistance with caregiver stress and learn of caregiver and or placement options.     Goals Addressed             This Visit's Progress    To reduce stress and anxiety related to caregiver assistance   On track    Care Coordination Interventions: Evaluation of current treatment plan related to caregiver stress and anxiety  and patient's adherence to plan as established by provider Motivational Interviewing employed Active listening / Reflection utilized  Emotional Support Provided Caregiver stress acknowledged  Determined patient is actively working with PCP and LCSW [name] for assistance with level of care concerns and or possibly placement for her aunt Educated patient about Hospice care for her aunt and discussed next steps      Interventions Today    Flowsheet Row Most Recent Value  Chronic Disease   Chronic disease during today's visit Other  [Ulcerative Colitis,  RA]  General Interventions   General Interventions Discussed/Reviewed General Interventions Discussed, General Interventions Reviewed, Doctor Visits, Communication with  Doctor Visits Discussed/Reviewed Doctor Visits Discussed, Doctor Visits Reviewed, Specialist, PCP  Communication with Social Work  [name] LCSW]  Exercise Interventions   Exercise Discussed/Reviewed Exercise Reviewed, Exercise Discussed  Education Interventions   Education Provided Provided Education  Provided Verbal Education On Mental Health/Coping with Illness, Medication, When to see the doctor, Nutrition  Mental Health Interventions   Mental Health Discussed/Reviewed --  [careburden  stress]  Refer to Social Work for counseling regarding Anxiety/Coping, Depression, Other  Teacher, English as a foreign language stress]  Refer to Social Work for resources regarding --  [employer] of care concerns for aunt]  Nutrition Interventions   Nutrition Discussed/Reviewed Nutrition Discussed, Nutrition Reviewed  Pharmacy Interventions   Pharmacy Dicussed/Reviewed Pharmacy Topics Discussed, Pharmacy Topics Reviewed, Medications and their functions          SDOH assessments and interventions completed:  No     Care Coordination Interventions:  Yes, provided   Follow up plan: Follow up call scheduled for 07/24/23 @11 :30 AM    Encounter Outcome:  Patient Visit Completed

## 2023-06-06 NOTE — Patient Instructions (Signed)
Visit Information  Thank you for taking time to visit with me today. Please don't hesitate to contact me if I can be of assistance to you.   Following are the goals we discussed today:   Goals Addressed             This Visit's Progress    To reduce stress and anxiety related to caregiver assistance   On track    Care Coordination Interventions: Evaluation of current treatment plan related to caregiver stress and anxiety  and patient's adherence to plan as established by provider Motivational Interviewing employed Active listening / Reflection utilized  Emotional Support Provided Caregiver stress acknowledged  Determined patient is actively working with PCP and LCSW [name] for assistance with level of care concerns and or possibly placement for her aunt Educated patient about Hospice care for her aunt and discussed next steps          Our next appointment is by telephone on 07/24/23 at 11:30 AM  Please call the care guide team at [phone] if you need to cancel or reschedule your appointment.   If you are experiencing a Mental Health or Behavioral Health Crisis or need someone to talk to, please call 1-800-273-TALK (toll free, 24 hour hotline)  Patient verbalizes understanding of instructions and care plan provided today and agrees to view in MyChart. Active MyChart status and patient understanding of how to access instructions and care plan via MyChart confirmed with patient.     Delsa Sale RN BSN CCM [place]  [facility] Health Nurse Care Coordinator  Direct Dial: [phone] Website: [email or web address]

## 2023-06-12 ENCOUNTER — Other Ambulatory Visit: Payer: Self-pay | Admitting: Nurse Practitioner

## 2023-06-12 DIAGNOSIS — F419 Anxiety disorder, unspecified: Secondary | ICD-10-CM

## 2023-06-12 NOTE — Assessment & Plan Note (Signed)
Influenza vaccine administered Encouraged to take Tylenol as needed for fever or muscle aches.

## 2023-06-12 NOTE — Assessment & Plan Note (Signed)
No recent episodes, overall doing well

## 2023-06-12 NOTE — Assessment & Plan Note (Signed)
Will check vitamin D level and supplement as needed.    Also encouraged to spend 15 minutes in the sun daily.   

## 2023-06-12 NOTE — Assessment & Plan Note (Signed)
Declines shingrix, educated on disease process and is aware if he changes his mind to notify office  

## 2023-06-12 NOTE — Assessment & Plan Note (Signed)
Blood pressure is well controlled, continue current medications.

## 2023-06-13 ENCOUNTER — Other Ambulatory Visit: Payer: Self-pay | Admitting: Nurse Practitioner

## 2023-06-13 DIAGNOSIS — F419 Anxiety disorder, unspecified: Secondary | ICD-10-CM

## 2023-06-13 MED ORDER — BUSPIRONE HCL 5 MG PO TABS: 5.0000 mg | ORAL_TABLET | Freq: Every day | ORAL | 1 refills | Status: DC

## 2023-07-10 ENCOUNTER — Other Ambulatory Visit: Payer: Self-pay | Admitting: Internal Medicine

## 2023-07-10 DIAGNOSIS — Z1231 Encounter for screening mammogram for malignant neoplasm of breast: Secondary | ICD-10-CM

## 2023-07-12 ENCOUNTER — Other Ambulatory Visit: Payer: Self-pay | Admitting: Nurse Practitioner

## 2023-07-13 ENCOUNTER — Encounter: Payer: Self-pay | Admitting: Nurse Practitioner

## 2023-07-17 ENCOUNTER — Encounter: Payer: Self-pay | Admitting: Internal Medicine

## 2023-07-17 ENCOUNTER — Ambulatory Visit: Payer: 59 | Attending: Internal Medicine | Admitting: Internal Medicine

## 2023-07-17 VITALS — BP 114/86 | HR 86 | Ht 68.0 in | Wt 199.8 lb

## 2023-07-17 DIAGNOSIS — I10 Essential (primary) hypertension: Secondary | ICD-10-CM | POA: Diagnosis not present

## 2023-07-17 NOTE — Progress Notes (Signed)
Cardiology Office Note:    Date:  07/17/2023   ID:  Gabrielle Hawkins, DOB 03-10-1966, MRN [id number]  PCP:  Arnette Felts, FNP   [facility] Providers Cardiologist:  Maisie Fus, MD     Referring MD: Arnette Felts, FNP   No chief complaint on file. LVH  History of Present Illness:    Gabrielle Hawkins is a 57 y.o. female with a hx below, breast cancer adriamycin, cytoxan, 2006 and radiation/right breast s/p lumpectomy and removal of lymph nodes and she is in remission, IBS, referral for LVH  She states that she went to the ED and having mild chest pains with her arm and shoulder. She feels more pain in her shoulder and her arm. It radiates around the breast. The duration has been 2 weeks. No provocation. Persistent dull and sharp pain.  She takes motrin and it can helps. 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 She has had some dizziness and has fainted. She stood up and went to the bathroom and fainted. She has mild hypertension. She was on prednisone. She had some swelling in the legs when she was working but not anymore. She is primarily taking care of her aunt. She gets winded with exertional activities and she rests when she needs to. She can walk up a flight of stairs ok. She denies PND, orthopnea. She has no hx ot DM2. No smoking history.   Her father has an ICD for CHF, noted significant bradycardia as well.  and her mother has CHF. No known diagnosis of hypertrophic cardiomyopathy. No family hx of sudden cardiac death.   She has hx of breast cancer in 2006 managed here. She was on doxorubicin. No hx of cardiotoxicity. XRT was to the right breast. S/p tamoxifen. She's in remission.   Interim Hx: EF was borderline low. Otherwise was normal.  She fell has had some arm surgeries.  Interim Hx 11/3 She is working on losing weight. Has not started her statin. She notes some recent palpitations.   Interim hx 07/17/2023 Her mother was diagnosed with  breast cancer. She denies chest pain or sob. No palpitations.    The 10-year ASCVD risk score (Arnett DK, et al., 2019) is: 2.6%   Values used to calculate the score:     Age: 17 years     Sex: Female     Is Non-Hispanic African American: Yes     Diabetic: No     Tobacco smoker: No     Systolic Blood Pressure: 124 mmHg     Is BP treated: Yes     HDL Cholesterol: 71 mg/dL     Total Cholesterol: 150 mg/dL    Current Medications: Current Outpatient Medications on File Prior to Visit  Medication Sig Dispense Refill   albuterol (VENTOLIN HFA) 108 (90 Base) MCG/ACT inhaler Inhale 2 puffs into the lungs every 6 (six) hours as needed for wheezing or shortness of breath. 8.5 g 2   amitriptyline (ELAVIL) 50 MG tablet TAKE ONE TABLET BY MOUTH AT BEDTIME 30 tablet 0   atorvastatin (LIPITOR) 20 MG tablet Take 1 tablet (20 mg total) by mouth daily. 30 tablet 11   BREO ELLIPTA 200-25 MCG/ACT AEPB Inhale into the lungs.     busPIRone (BUSPAR) 5 MG tablet Take 1 tablet (5 mg total) by mouth at bedtime. 90 tablet 1   EPINEPHrine (EPIPEN 2-PAK) 0.3 mg/0.3 mL IJ SOAJ injection Inject 0.3 mg into the muscle as needed  for anaphylaxis. 1 each 2   eszopiclone (LUNESTA) 2 MG TABS tablet Take immediately before bedtime 30 tablet 2   folic acid (FOLVITE) 1 MG tablet Take 3 mg by mouth daily.     golimumab (SIMPONI ARIA) 50 MG/4ML SOLN injection 50 mg See admin instructions. Infusion every 8 weeks, [place] [facility]     hydroxychloroquine (PLAQUENIL) 200 MG tablet Take 200 mg by mouth 2 (two) times daily.     levocetirizine (XYZAL) 5 MG tablet TAKE ONE TABLET BY MOUTH EVERY EVENING 30 tablet 5   methotrexate (50 MG/ML) 1 g injection Inject into the vein every Monday. 0.63ml every Monday     Multiple Vitamins-Minerals (ONE A DAY WOMEN 50 PLUS) CHEW Chew 1 tablet by mouth daily.     olmesartan (BENICAR) 20 MG tablet Take 1 tablet (20 mg total) by mouth daily. 90 tablet 1   omeprazole (PRILOSEC) 40 MG  capsule Take 1 capsule (40 mg total) by mouth daily. 30 capsule 3   OVER THE COUNTER MEDICATION Aleve cream for pain     tiZANidine (ZANAFLEX) 2 MG tablet Take 2 mg by mouth 3 (three) times daily.     Vitamin D, Ergocalciferol, (DRISDOL) 1.25 MG (50000 UNIT) CAPS capsule Take 1 capsule (50,000 Units total) by mouth every 7 (seven) days. 24 capsule 2   [DISCONTINUED] inFLIXimab (REMICADE) 100 MG injection Inject 100 mg into the vein every 30 (thirty) days. Infuse by intravenous route every 4 weeks  Patient is receiving Inflectra brand verses Remicade brand due to insurance (Patient not taking: Reported on 04/10/2020)     [DISCONTINUED] temazepam (RESTORIL) 7.5 MG capsule Take 1 capsule (7.5 mg total) by mouth at bedtime as needed for sleep. (Patient not taking: Reported on 07/09/2020) 30 capsule 5   No current facility-administered medications on file prior to visit.     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    Patient has 2 years of college education.    Patient has 0 children.     Patient has a boyfriend.          Social Determinants of Health   Financial Resource Strain: Low Risk  (08/24/2022)   Overall Financial Resource Strain (CARDIA)    Difficulty of Paying Living Expenses: Not hard at all  Food Insecurity: No Food Insecurity (02/14/2023)   Hunger Vital Sign    Worried About Running Out of Food in the Last Year: Never true    Ran Out of Food in the Last Year: Never true  Transportation Needs: No Transportation Needs (08/24/2022)  PRAPARE - Administrator, Civil Service (Medical): No    Lack of Transportation (Non-Medical): No  Physical Activity: Insufficiently Active (08/24/2022)   Exercise Vital Sign    Days of Exercise per Week: 7 days    Minutes of Exercise per Session: 20 min  Stress: No Stress Concern Present (08/24/2022)   [employer] of Occupational Health - Occupational Stress Questionnaire    Feeling of Stress : Not at all  Social Connections: Not on file     Family History: The patient's family history includes Breast cancer (age of onset: 35) in her mother; Cancer in her maternal grandmother; Heart disease in her father and paternal grandmother; Heart failure in her father and paternal grandmother.  ROS:   Please see the history of present illness.     All other systems reviewed and are negative.  EKGs/Labs/Other Studies Reviewed:    The following studies were reviewed today:   EKG:  EKG is  ordered today.  The ekg ordered today demonstrates   EKG 10/06/2021- NSR, LVH  EKG 01/05/2022- NSR, LVH  11/3- NSR, LVH   TTE 10/19/2021 : EF 50-55, nl RV, no valve dx, no pulmonary HTN  CT PE 09/21/2021 - noted CAC  Recent Labs: 11/24/2022: ALT 31; BUN 12; Creatinine, Ser 0.98; Potassium 4.0; Sodium 142    Recent Lipid Panel    Component Value Date/Time   CHOL 150 10/10/2022 1053   TRIG 51 10/10/2022 1053   HDL 71 10/10/2022 1053   CHOLHDL 2.1 10/10/2022 1053   CHOLHDL 3.5 12/24/2009 0817   VLDL 10 12/24/2009  0817   LDLCALC 68 10/10/2022 1053     Risk Assessment/Calculations:           Physical Exam:    VS:    There were no vitals filed for this visit.     Wt Readings from Last 3 Encounters:  05/30/23 199 lb 12.8 oz (90.6 kg)  02/10/23 203 lb (92.1 kg)  09/26/22 200 lb (90.7 kg)     GEN:  Well nourished, well developed in no acute distress HEENT: Normal NECK: No JVD; No carotid bruits LYMPHATICS: No lymphadenopathy CARDIAC: RRR, no murmurs, rubs, gallops RESPIRATORY:  Clear to auscultation without rales, wheezing or rhonchi  ABDOMEN: Soft, non-tender, non-distended MUSCULOSKELETAL:  No edema; No deformity  SKIN: Warm and dry NEUROLOGIC:  Alert and oriented x 3 PSYCHIATRIC:  Normal affect   ASSESSMENT:    #Palpitations: Her 3-day Zio patch was unremarkable.  She had diary reentry events for sinus rhythm  #LVH: Echo did not show hypertrophy. This EKG finding is benign.    #HTN: Ok today. Continue with goal blood pressure < 130/80 mmHg. Continue Olmesartan 20 mg daily.   #CAC: noted to have coronary calcification.  LDL goal < 70 mg/dL. Started atorvastatin 20 mg daily. LDL 68 mg/dL   #Cardio-Onc: Has hx of treatment with doxorubicin which is associated with cardiac dysfunction with incidence ~20%. She has no known hx of cardiotoxicity. She had radiation to the right side; carries lower risk of fibrotic valve dx/accelerated atherosclerosis  PLAN:    In order of problems listed above:   Follow up in 12 months       Medication Adjustments/Labs and Tests Ordered: Current medicines are reviewed at length with the patient today.  Concerns regarding medicines are outlined above.  No orders of the defined types were placed in this encounter.  No orders of the defined types were placed in this encounter.  There are no Patient Instructions on file for this visit.   Signed, Maisie Fus, MD  07/17/2023 10:09 AM    [facility]

## 2023-07-17 NOTE — Patient Instructions (Signed)
Medication Instructions:  No changes  Continue taking medications  *If you need a refill on your cardiac medications before your next appointment, please call your pharmacy*   Lab Work: None  If you have labs (blood work) drawn today and your tests are completely normal, you will receive your results only by: MyChart Message (if you have MyChart) OR A paper copy in the mail If you have any lab test that is abnormal or we need to change your treatment, we will call you to review the results.   Testing/Procedures: None    Follow-Up: At [facility], you and your health needs are our priority.  As part of our continuing mission to provide you with exceptional heart care, we have created designated Provider Care Teams.  These Care Teams include your primary Cardiologist (physician) and Advanced Practice Providers (APPs -  Physician Assistants and Nurse Practitioners) who all work together to provide you with the care you need, when you need it.    Your next appointment:   1 year(s)  Provider:   Maisie Fus, MD

## 2023-07-18 DIAGNOSIS — M459 Ankylosing spondylitis of unspecified sites in spine: Secondary | ICD-10-CM | POA: Diagnosis not present

## 2023-07-18 DIAGNOSIS — K5190 Ulcerative colitis, unspecified, without complications: Secondary | ICD-10-CM | POA: Diagnosis not present

## 2023-07-19 ENCOUNTER — Other Ambulatory Visit: Payer: Self-pay | Admitting: Nurse Practitioner

## 2023-07-19 DIAGNOSIS — Z803 Family history of malignant neoplasm of breast: Secondary | ICD-10-CM

## 2023-07-19 DIAGNOSIS — Z853 Personal history of malignant neoplasm of breast: Secondary | ICD-10-CM

## 2023-07-24 ENCOUNTER — Ambulatory Visit: Payer: Self-pay

## 2023-07-24 NOTE — Patient Outreach (Signed)
  Care Coordination   Follow Up Visit Note   07/24/2023 Name: Gabrielle Hawkins MRN: [id number] DOB: 1966-08-01  Gabrielle Hawkins is a 57 y.o. year old female who sees Arnette Felts, FNP for primary care. I spoke with  Gabrielle Hawkins by phone today.  What matters to the patients health and wellness today?  Patient would like to continue her current treatment for Ulcerative Colitis management.     Goals Addressed             This Visit's Progress    To complete genetic counseling and lab testing       Care Coordination Interventions: Evaluation of current treatment plan related to family history of breast cancer and patient's adherence to plan as established by provider Discussed with patient her mother's oncologist suggested patient get retested for the breast cancer gene due there mother has reoccurring breast cancer after being a survivor of breast cancer x 24 years and patient underwent a lumpectomy in the past Reviewed and discussed with patient her upcoming scheduled visits at [facility]- med-oncology with Genetic Counselor [name] on 08/22/23 @11 :00 AM, labs scheduled for 12 PM      To keep ulcerative colitis under good control       Care Coordination Interventions: Evaluation of current treatment plan related to Ulcerative Colitis  and patient's adherence to plan as established by provider Determined patient continues to receive a biological infusion for her UC with good effectiveness Discussed with patient her concerns that her patient assistance may not last long due to patient was sent a couple of letters from the manufacturer suggesting her funds may be running out Encouraged patient to discuss her concerns with her Rheumatologist during her next scheduled visit and ask for pharmacy assistance Educated patient about the [facility] pharmacist availability if needed for assistance and patient verbalizes understanding  Discussed plans with patient for ongoing care  coordination follow up and provided patient with direct contact information for nurse care coordinator     To reduce stress and anxiety related to caregiver assistance   On track    Care Coordination Interventions: Evaluation of current treatment plan related to caregiver stress and anxiety  and patient's adherence to plan as established by provider Motivational Interviewing employed Active listening / Reflection utilized  Emotional Support Provided Caregiver stress acknowledged  Reviewed upcoming scheduled telephone visit with [name] LCSW scheduled for 07/31/23 @10 :30 AM     Interventions Today    Flowsheet Row Most Recent Value  Chronic Disease   Chronic disease during today's visit Other  [Ulcerative Colitis,  breast cancer genetic testing]  General Interventions   General Interventions Discussed/Reviewed General Interventions Discussed, General Interventions Reviewed, Doctor Visits  Doctor Visits Discussed/Reviewed Doctor Visits Discussed, Doctor Visits Reviewed, Specialist  Education Interventions   Education Provided Provided Education  Provided Verbal Education On Labs, Medication, When to see the doctor  Pharmacy Interventions   Pharmacy Dicussed/Reviewed Pharmacy Topics Reviewed, Pharmacy Topics Discussed, Medications and their functions          SDOH assessments and interventions completed:  No     Care Coordination Interventions:  Yes, provided   Follow up plan: Follow up call scheduled for 09/22/23 @09 :00 AM    Encounter Outcome:  Patient Visit Completed

## 2023-07-24 NOTE — Patient Instructions (Signed)
Visit Information  Thank you for taking time to visit with me today. Please don't hesitate to contact me if I can be of assistance to you.   Following are the goals we discussed today:   Goals Addressed             This Visit's Progress    To complete genetic counseling and lab testing       Care Coordination Interventions: Evaluation of current treatment plan related to family history of breast cancer and patient's adherence to plan as established by provider Discussed with patient her mother's oncologist suggested patient get retested for the breast cancer gene due there mother has reoccurring breast cancer after being a survivor of breast cancer x 24 years and patient underwent a lumpectomy in the past Reviewed and discussed with patient her upcoming scheduled visits at [facility]- med-oncology with Genetic Counselor Gabrielle Hawkins on 08/22/23 @11 :00 AM, labs scheduled for 12 PM      To keep ulcerative colitis under good control       Care Coordination Interventions: Evaluation of current treatment plan related to Ulcerative Colitis  and patient's adherence to plan as established by provider Determined patient continues to receive a biological infusion for her UC with good effectiveness Discussed with patient her concerns that her patient assistance may not last long due to patient was sent a couple of letters from the manufacturer suggesting her funds may be running out Encouraged patient to discuss her concerns with her Rheumatologist during her next scheduled visit and ask for pharmacy assistance Educated patient about the [facility] pharmacist availability if needed for assistance and patient verbalizes understanding  Discussed plans with patient for ongoing care coordination follow up and provided patient with direct contact information for nurse care coordinator     To reduce stress and anxiety related to caregiver assistance   On track    Care Coordination Interventions: Evaluation of  current treatment plan related to caregiver stress and anxiety  and patient's adherence to plan as established by provider Motivational Interviewing employed Active listening / Reflection utilized  Emotional Support Provided Caregiver stress acknowledged  Reviewed upcoming scheduled telephone visit with Gabrielle Lucks LCSW scheduled for 07/31/23 @10 :30 AM        Our next appointment is by telephone on 09/21/22 at 09:00 AM  Please call the care guide team at [phone] if you need to cancel or reschedule your appointment.   If you are experiencing a Mental Health or Behavioral Health Crisis or need someone to talk to, please call 1-800-273-TALK (toll free, 24 hour hotline)  Patient verbalizes understanding of instructions and care plan provided today and agrees to view in MyChart. Active MyChart status and patient understanding of how to access instructions and care plan via MyChart confirmed with patient.     Gabrielle Sale RN BSN CCM [place]  [facility] Health Nurse Care Coordinator  Direct Dial: [phone] Website: [name]

## 2023-07-31 ENCOUNTER — Ambulatory Visit: Payer: Self-pay | Admitting: Licensed Clinical Social Worker

## 2023-07-31 NOTE — Patient Outreach (Signed)
  Care Coordination   Follow Up Visit Note   07/31/2023 Name: KAMYRN SNELSON MRN: [id number] DOB: [birth year]  SHONDRIA GARDUNO is a 57 y.o. year old female who sees Arnette Felts, FNP for primary care. I spoke with  [name] by phone today.  What matters to the patients health and wellness today?  Patient is visiting with aunt. Requested to r/s with LCSW   SDOH assessments and interventions completed:  No     Care Coordination Interventions:  Yes, provided   Follow up plan: Follow up call scheduled for 08/07/23    Encounter Outcome:  Patient Request to Call Back   [name], MSW, LCSW [facility] Care Management [facility]  [facility] [email or web address] Phone [phone] 10:42 AM

## 2023-07-31 NOTE — Patient Instructions (Signed)
Visit Information  Thank you for taking time to visit with me today. Please don't hesitate to contact me if I can be of assistance to you.   Following are the goals we discussed today:   Goals Addressed   None     Our next appointment is by telephone on 12/2 at 9 AM  Please call the care guide team at [phone] if you need to cancel or reschedule your appointment.   If you are experiencing a Mental Health or Behavioral Health Crisis or need someone to talk to, please call the Suicide and Crisis Lifeline: 988 call 911   Patient verbalizes understanding of instructions and care plan provided today and agrees to view in MyChart. Active MyChart status and patient understanding of how to access instructions and care plan via MyChart confirmed with patient.     [name], MSW, LCSW [facility] Care Management [place]  [facility] [email or web address] Phone [phone] 10:42 AM

## 2023-08-02 DIAGNOSIS — M25531 Pain in right wrist: Secondary | ICD-10-CM | POA: Diagnosis not present

## 2023-08-02 DIAGNOSIS — Z79899 Other long term (current) drug therapy: Secondary | ICD-10-CM | POA: Diagnosis not present

## 2023-08-02 DIAGNOSIS — M25532 Pain in left wrist: Secondary | ICD-10-CM | POA: Diagnosis not present

## 2023-08-02 DIAGNOSIS — M459 Ankylosing spondylitis of unspecified sites in spine: Secondary | ICD-10-CM | POA: Diagnosis not present

## 2023-08-02 DIAGNOSIS — K5100 Ulcerative (chronic) pancolitis without complications: Secondary | ICD-10-CM | POA: Diagnosis not present

## 2023-08-02 DIAGNOSIS — M25572 Pain in left ankle and joints of left foot: Secondary | ICD-10-CM | POA: Diagnosis not present

## 2023-08-02 DIAGNOSIS — M25571 Pain in right ankle and joints of right foot: Secondary | ICD-10-CM | POA: Diagnosis not present

## 2023-08-07 ENCOUNTER — Ambulatory Visit: Payer: Self-pay | Admitting: Licensed Clinical Social Worker

## 2023-08-07 NOTE — Patient Instructions (Signed)
Visit Information  Thank you for taking time to visit with me today. Please don't hesitate to contact me if I can be of assistance to you.   Following are the goals we discussed today:   Goals Addressed             This Visit's Progress    Obtain Supportive Resources-Caregiver Fatigue   On track    Activities and task to complete in order to accomplish goals.   Keep all upcoming appointments discussed today Continue with compliance of taking medication prescribed by Doctor Implement healthy coping skills discussed to assist with management of symptoms Continue working with [facility] care team to assist with goals identified          Our next appointment is by telephone on 2/3   Please call the care guide team at [phone] if you need to cancel or reschedule your appointment.   If you are experiencing a Mental Health or Behavioral Health Crisis or need someone to talk to, please call the Suicide and Crisis Lifeline: 988 call 911   Patient verbalizes understanding of instructions and care plan provided today and agrees to view in MyChart. Active MyChart status and patient understanding of how to access instructions and care plan via MyChart confirmed with patient.     [name], MSW, LCSW [facility] Care Management [place]  [facility] [email or web address] Phone [phone] 2:22 PM

## 2023-08-07 NOTE — Patient Outreach (Signed)
  Care Coordination   Follow Up Visit Note   08/07/2023 Name: Gabrielle Hawkins MRN: [id number] DOB: September 04, 1966  Gabrielle Hawkins is a 57 y.o. year old female who sees Arnette Felts, FNP for primary care. I spoke with  [name] by phone today.  What matters to the patients health and wellness today?  Stress Management    Goals Addressed             This Visit's Progress    Obtain Supportive Resources-Caregiver Fatigue   On track    Activities and task to complete in order to accomplish goals.   Keep all upcoming appointments discussed today Continue with compliance of taking medication prescribed by Doctor Implement healthy coping skills discussed to assist with management of symptoms Continue working with [facility] care team to assist with goals identified          SDOH assessments and interventions completed:  Yes  SDOH Interventions Today    Flowsheet Row Most Recent Value  SDOH Interventions   Food Insecurity Interventions Intervention Not Indicated  Housing Interventions Intervention Not Indicated  Transportation Interventions Intervention Not Indicated  Utilities Interventions Intervention Not Indicated        Care Coordination Interventions:  Yes, provided  Interventions Today    Flowsheet Row Most Recent Value  Chronic Disease   Chronic disease during today's visit Hypertension (HTN)  General Interventions   General Interventions Discussed/Reviewed General Interventions Reviewed, Doctor Visits  Doctor Visits Discussed/Reviewed Doctor Visits Reviewed  Mental Health Interventions   Mental Health Discussed/Reviewed Mental Health Reviewed, Coping Strategies, Anxiety  [Strategies discussed to assist with management of symptoms associated with caregiver stress and stress management. Anxiety about upcoming genetic testing validated and encouragement provided]  Nutrition Interventions   Nutrition Discussed/Reviewed Nutrition Reviewed  Pharmacy  Interventions   Pharmacy Dicussed/Reviewed Pharmacy Topics Reviewed, Medication Adherence  Advanced Directive Interventions   Advanced Directives Discussed/Reviewed End of Life  End of Life --  [Discussed respite care benefits through hospice for loved one, plans on utilizing benefit in the new year]       Follow up plan: Follow up call scheduled for 6-8 weeks    Encounter Outcome:  Patient Visit Completed   [name], MSW, LCSW [facility] Care Management [facility] Health  [facility] [email or web address] Phone [phone] 2:21 PM

## 2023-08-10 ENCOUNTER — Ambulatory Visit: Payer: 59

## 2023-08-14 ENCOUNTER — Ambulatory Visit
Admission: RE | Admit: 2023-08-14 | Discharge: 2023-08-14 | Disposition: A | Discharge status: Home / Self Care | Payer: 59 | Source: Ambulatory Visit | Attending: Internal Medicine | Admitting: Internal Medicine

## 2023-08-14 DIAGNOSIS — Z1231 Encounter for screening mammogram for malignant neoplasm of breast: Secondary | ICD-10-CM | POA: Diagnosis not present

## 2023-08-21 ENCOUNTER — Other Ambulatory Visit: Payer: Self-pay | Admitting: Nurse Practitioner

## 2023-08-21 MED ORDER — AMITRIPTYLINE HCL 50 MG PO TABS: 50.0000 mg | ORAL_TABLET | Freq: Every day | ORAL | 0 refills | Status: DC

## 2023-08-21 NOTE — Progress Notes (Signed)
REFERRING PROVIDER: Arnette Felts, FNP [address] [place],  [place] [address]  PRIMARY PROVIDER:  Arnette Felts, FNP  PRIMARY REASON FOR VISIT:  Encounter Diagnoses  Name Primary?   Personal history of breast cancer Yes   Family history of breast cancer    Family history of colon cancer    Family history of prostate cancer      HISTORY OF PRESENT ILLNESS:   Gabrielle Hawkins, a 57 y.o. female, was seen for a [place] cancer genetics consultation at the request of Penelope Galas, FNP due to a personal and family history of breast cancer.  Gabrielle Hawkins presents to clinic today to discuss the possibility of a hereditary predisposition to cancer, to discuss genetic testing, and to further clarify her future cancer risks, as well as potential cancer risks for family members.   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 The treatment plan including breast conserving surgery, chemotherapy, adjuvant radiation, and anti-estrogens for 2 years.    She had genetic testing for BRCA1/2 only through [facility] in 2006, which was negative.    SCREENING/RISK FACTORS:  Mammogram within the last year: yes Colonoscopy: yes; most recent this year.  No known colon polyps.  Hysterectomy and BSO at age 64.  Dermatology screening: no   Past Medical History:  Diagnosis Date   Anemia 1998   Anxiety    Arthritis    big toe    Asthma    BRCA1 negative    BRCA2 negative    Breast cancer (HCC) 2006   chem/radiation/2years tamoxifen   Chronic neck and back pain    Crohn's disease (HCC)    Depression    Endometriosis    Fibroid    GERD (gastroesophageal reflux disease)    History of chemotherapy 01/2005   Hx of tamoxifen therapy    Hx of transfusion of packed red blood cells    Hypertension    IBS (irritable bowel syndrome)    Kidney stones    Lymphedema of arm    Migraines    just at dx of breast CA    Mucinous cystadenoma of ovary    left   Neuropathy    Paresthesias    Peripheral neuropathy    Personal history of chemotherapy    Personal history of radiation therapy    Radiation 04/2005   Ulcerative colitis (HCC)    with chronic diarrhea   Urinary tract infection    x 3   Vertigo 2014   Vitamin D deficiency     Past Surgical History:  Procedure Laterality Date   ABDOMINAL HYSTERECTOMY  09/05/2005   TAH/BSO   ANTERIOR INTEROSSEOUS NERVE DECOMPRESSION Right 01/13/2022   Procedure: ANTERIOR INTEROSSEOUS NERVE DECOMPRESSION;  Surgeon: Bjorn Pippin, MD;  Location: [facility];  Service: Orthopedics;  Laterality: Right;   BREAST LUMPECTOMY     BREAST SURGERY  09/05/2004   LUMPECTOMY   CHOLECYSTECTOMY     CHOLECYSTECTOMY N/A 04/29/2014   Procedure: LAPAROSCOPIC CHOLECYSTECTOMY WITH INTRAOPERATIVE CHOLANGIOGRAM;  Surgeon: Mariella Saa, MD;  Location: [facility];  Service: General;  Laterality: N/A;   COLONOSCOPY  02/04/2012   Dr. Elnoria Howard: sessile serrated adenoma   COLONOSCOPY WITH PROPOFOL N/A 03/13/2015   Procedure: COLONOSCOPY WITH PROPOFOL;  Surgeon: Jeani Hawking, MD;  Location: [facility];  Service: Endoscopy;  Laterality: N/A;   DILATION AND CURETTAGE OF UTERUS     ELBOW  LIGAMENT RECONSTRUCTION Right 11/15/2021   Procedure: ELBOW LIGAMENT REPAIR/ LCL;  Surgeon: Bjorn Pippin, MD;  Location: [facility];  Service: Orthopedics;  Laterality: Right;  Regional block   FLEXIBLE SIGMOIDOSCOPY N/A 07/18/2014   Dr. Elnoria Howard: mild left-sided colitis, biopsies with quiescent UC   HARDWARE REMOVAL Right 01/13/2022   Procedure: HARDWARE REMOVAL/ELBOW;  Surgeon: Bjorn Pippin, MD;  Location: [facility];  Service: Orthopedics;  Laterality: Right;   MYOMECTOMY     ORIF RADIAL FRACTURE Right 11/15/2021   Procedure: RIGHT RADIAL HEAD ARTHROPLASTY;  Surgeon: Bjorn Pippin, MD;  Location: [facility];  Service: Orthopedics;   Laterality: Right;  Regional block   ORIF ULNAR FRACTURE Right 11/15/2021   Procedure: OPEN REDUCTION INTERNAL FIXATION (ORIF) RIGHT ULNAR FRACTURE;  Surgeon: Bjorn Pippin, MD;  Location: [facility];  Service: Orthopedics;  Laterality: Right;  Regional block   TONSILLECTOMY      FAMILY HISTORY:  We obtained a detailed, 4-generation family history.  Significant diagnoses are listed below: Family History  Problem Relation Age of Onset   Breast cancer Mother 27       2nd primary dx 58; neg GT   Leukemia Father 18       ? chronic; survelliance only   Colon cancer Maternal Grandmother        d. 10s   Cervical cancer Paternal Grandmother        dx 90s   Prostate cancer Paternal Grandfather        dx >60; mets to bone   Cancer Other        MGF's nephews w/ prostate and pancreatic cancer; MGF"s niece w/ lung cancer; MGF's sister w/ unk cancer   Breast cancer Maternal Great-grandmother        d. >91; MGF's mother     Gabrielle Hawkins mother had genetic testing in October 2024 for the [facility] Common Hereditary Cancers +RNA Panel, which was negative.   The [facility] Common Hereditary Cancers + RNA Panel includes sequencing, deletion/duplication, and RNA analysis of the following 48 genes: APC, ATM, AXIN2, BAP1, BARD1, BMPR1A, BRCA1, BRCA2, BRIP1, CDH1, CDK4*, CDKN2A*, CHEK2, CTNNA1, DICER1, EPCAM* (del/dup only), FH, GREM1* (promoter dup analysis only), HOXB13*, KIT*, MBD4*, MEN1, MLH1, MSH2, MSH3, MSH6, MUTYH, NF1, NTHL1, PALB2, PDGFRA*, PMS2, POLD1, POLE, PTEN, RAD51C, RAD51D, SDHA (sequencing only), SDHB, SDHC, SDHD, SMAD4, SMARCA4, STK11, TP53, TSC1, TSC2, VHL.  *Genes without RNA analysis.  She stated her sister may have had negative genetic testing but no report was available for review today.  She is unaware of other family history of hereditary cancer genetic tesitng.    There is no reported Ashkenazi Jewish ancestry. There is no known consanguinity.  GENETIC COUNSELING  ASSESSMENT: Gabrielle Hawkins is a 57 y.o. female with a personal history which is somewhat suggestive of a hereditary cancer syndrome and predisposition to cancer given her age at the time of her breast cancer diagnosis. We, therefore, discussed and recommended the following at today's visit.   DISCUSSION: We discussed that 5 - 10% of cancer is hereditary.  Most cases of hereditary breast cancer are associated with mutations in BRCA1/2.  Given that Gabrielle Hawkins had genetic testing for hereditary cancer previously, we discussed the differences between the testing that was performed in 2006 and genetic testing offered today.  We discussed that in additions to the genes (BRCA1/2 only) had tested previously, additional genes have been found to be associated with breast cancer.  Examples of these genes include but are not limited to CHEK2, ATM, and PALB2.  Furthermore, we discussed that more comprehensive testing is available with RNA analysis and comprehensive rearrangement analysis. We discussed that testing is beneficial for several reasons including knowing how to follow individuals for their cancer risks and understanding if other family members could be at risk for cancer and allowing them to undergo genetic testing.   We reviewed the characteristics, features and inheritance patterns of hereditary cancer syndromes. We also discussed genetic testing, including the appropriate family members to test, the process of testing, insurance coverage and turn-around-time for results. We discussed the implications of a negative, positive, carrier and/or variant of uncertain significant result. We recommended Gabrielle Hawkins pursue genetic testing for a panel that includes genes associated with breast cancer, prostate cancer, hematological cancers, and other cancers.   Gabrielle Hawkins  was offered a common hereditary cancer panel (~40 genes) and an expanded pan-cancer panel (~70 genes). Gabrielle Hawkins was informed of the benefits  and limitations of each panel, including that expanded pan-cancer panels contain genes that do not have clear management guidelines at this point in time.  We also discussed that as the number of genes included on a panel increases, the chances of variants of uncertain significance increases.  After considering the benefits and limitations of each gene panel, Gabrielle Hawkins  elected to have a common hereditary cancers panel through [facility].  The CustomNext-Cancer+RNAinsight panel offered by [facility] includes sequencing, rearrangement, and RNA analysis for the following 45 genes:  APC, ATM, AXIN2, BAP1, BARD1, BMPR1A, BRCA1, BRCA2, BRIP1, CDH1, CDKN2A, CEBPA, CHEK2, DDX41, EPCAM, ETV6, FH, FLCN, GATA2, GREM1, HOXB13, MBD4, MET, MLH1, MSH2, MSH3, MSH6, MUTYH, NF1, NTHL1, PALB2, PMS2, POLD1, POLE, POT1, PTEN, RAD51C, RAD51D, RUNX1, SMAD4, STK11, TP53, TSC1, TSC2, VHL.   Based on Gabrielle Hawkins personal history of breast cancer before age 49, she meets NCCN criteria for panel-based genetic testing. Genetic testing performed in 2006 is not comprehensive for breast cancer genes by today's standards and updated genetic testing is indicated.  She is the most informative relative to test in the family.  Despite that she meets criteria, she may still have an out of pocket cost. We discussed that if her out of pocket cost for testing is over $100, the laboratory should contact her and discuss the self-pay prices and/or patient pay assistance programs.    PLAN: After considering the risks, benefits, and limitations, Gabrielle Hawkins provided informed consent to pursue genetic testing and the blood sample was sent to [facility] for analysis of the CustomNext-Cancer +RNAinsight Panel. Results should be available within approximately 3 weeks, at which point they will be disclosed by telephone to Gabrielle Hawkins, as will any additional recommendations warranted by these results. Gabrielle Hawkins will receive a summary  of her genetic counseling visit and a copy of her results once available. This information will also be available in Epic.   Gabrielle Hawkins questions were answered to her satisfaction today. Our contact information was provided should additional questions or concerns arise. Thank you for the referral and allowing [place] to share in the care of your patient.   Abiageal Blowe M. Rennie Plowman, MS, [facility] Genetic Counselor [email or web address] (P) [phone]  The patient was seen for a total of 40 minutes in face-to-face genetic counseling.  The patient was seen alone.  Drs. Gunnar Bulla and/or Mosetta Putt were available to discuss this case as needed.    _______________________________________________________________________ For Office Staff:  Number of people involved in  session: 1 Was an Intern/ student involved with case: no

## 2023-08-22 ENCOUNTER — Inpatient Hospital Stay: Payer: 59

## 2023-08-22 ENCOUNTER — Encounter: Payer: Self-pay | Admitting: Genetic Counselor

## 2023-08-22 ENCOUNTER — Inpatient Hospital Stay: Payer: 59 | Attending: Genetic Counselor | Admitting: Genetic Counselor

## 2023-08-22 DIAGNOSIS — Z853 Personal history of malignant neoplasm of breast: Secondary | ICD-10-CM

## 2023-08-22 DIAGNOSIS — Z803 Family history of malignant neoplasm of breast: Secondary | ICD-10-CM

## 2023-08-22 DIAGNOSIS — Z800 Family history of malignant neoplasm of digestive organs: Secondary | ICD-10-CM | POA: Diagnosis not present

## 2023-08-22 DIAGNOSIS — Z8042 Family history of malignant neoplasm of prostate: Secondary | ICD-10-CM | POA: Diagnosis not present

## 2023-08-22 LAB — GENETIC SCREENING ORDER

## 2023-08-24 ENCOUNTER — Other Ambulatory Visit: Payer: Self-pay | Admitting: Nurse Practitioner

## 2023-08-24 DIAGNOSIS — G4700 Insomnia, unspecified: Secondary | ICD-10-CM

## 2023-09-07 ENCOUNTER — Encounter: Payer: Self-pay | Admitting: Genetic Counselor

## 2023-09-07 ENCOUNTER — Ambulatory Visit: Payer: Self-pay | Admitting: Genetic Counselor

## 2023-09-07 ENCOUNTER — Telehealth: Payer: Self-pay | Admitting: Genetic Counselor

## 2023-09-07 DIAGNOSIS — Z803 Family history of malignant neoplasm of breast: Secondary | ICD-10-CM

## 2023-09-07 DIAGNOSIS — Z1379 Encounter for other screening for genetic and chromosomal anomalies: Secondary | ICD-10-CM | POA: Insufficient documentation

## 2023-09-07 DIAGNOSIS — Z853 Personal history of malignant neoplasm of breast: Secondary | ICD-10-CM

## 2023-09-07 DIAGNOSIS — Z8042 Family history of malignant neoplasm of prostate: Secondary | ICD-10-CM

## 2023-09-07 DIAGNOSIS — Z800 Family history of malignant neoplasm of digestive organs: Secondary | ICD-10-CM

## 2023-09-07 NOTE — Progress Notes (Signed)
 HPI:   Ms. Woolf was previously seen in [facility] due to a personal history of breast cancer and concerns regarding a hereditary predisposition to cancer.    Ms. Koper recent genetic test results were disclosed to her by telephone. These results and recommendations are discussed in more detail below.  CANCER HISTORY:  In 2006, at the age of 22, Ms. 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 The treatment plan including breast conserving surgery, chemotherapy, adjuvant radiation, and anti-estrogens for 2 years.     FAMILY HISTORY:  We obtained a detailed, 4-generation family history.  Significant diagnoses are listed below:      Family History  Problem Relation Age of Onset   Breast cancer Mother 37        2nd primary dx 1; neg GT   Leukemia Father 33        ? chronic; survelliance only   Colon cancer Maternal Grandmother          d. 67s   Cervical cancer Paternal Grandmother          dx 90s   Prostate cancer Paternal Grandfather          dx >60; mets to bone   Cancer Other          MGF's nephews w/ prostate and pancreatic cancer; MGFs niece w/ lung cancer; MGF's sister w/ unk cancer   Breast cancer Maternal Great-grandmother          d. >54; MGF's mother       Ms. Admire mother had genetic testing in October 2024 for the [employer] Common Hereditary Cancers +RNA Panel, which was negative.   The [facility] Common Hereditary Cancers + RNA Panel includes sequencing, deletion/duplication, and RNA analysis of the following 48 genes: APC, ATM, AXIN2, BAP1, BARD1, BMPR1A, BRCA1, BRCA2, BRIP1, CDH1, CDK4*, CDKN2A*, CHEK2, CTNNA1, DICER1, EPCAM* (del/dup only), FH, GREM1* (promoter dup analysis only), HOXB13*, KIT*, MBD4*, MEN1, MLH1, MSH2, MSH3, MSH6, MUTYH, NF1, NTHL1, PALB2, PDGFRA*, PMS2, POLD1, POLE, PTEN, RAD51C, RAD51D, SDHA (sequencing only), SDHB, SDHC, SDHD, SMAD4, SMARCA4, STK11, TP53, TSC1, TSC2,  VHL.  *Genes without RNA analysis.  She stated her sister may have had negative genetic testing but no report was available for review today.  She is unaware of other family history of hereditary cancer genetic tesitng.     There is no reported Ashkenazi Jewish ancestry. There is no known consanguinity.  GENETIC TEST RESULTS:  The [facility] CustomNext-Cancer +RNAinsight Panel found no pathogenic mutations.  The CustomNext-Cancer+RNAinsight panel offered by [facility] includes sequencing, rearrangement, and RNA analysis for the following 45 genes: APC, ATM, BAP1, BARD1, BMPR1A, BRCA1, BRCA2, BRIP1, CDH1, CDKN2A, CEBPA, CHEK2, ETV6, FH, FLCN, GATA2, MET, MLH1, MSH2, MSH6, MUTYH, NF1, NTHL1, PALB2, PMS2, POT1, PTEN, RAD51C, RAD51D, RUNX1, SMAD4, STK11, TP53, TSC1, TSC2 and VHL (sequencing and deletion/duplication); AXIN2, DDX41, HOXB13, MBD4, MSH3, POLD1 and POLE (sequencing only); EPCAM and GREM1 (deletion/duplication only). RNA data is routinely analyzed for use in variant interpretation for all genes.  The test report has been scanned into EPIC and is located under the Molecular Pathology section of the Results Review tab.  A portion of the result report is included below for reference. Genetic testing reported out on September 06, 2023.      Even though a pathogenic variant was not identified, possible explanations for the cancer in the family may include: There may be no hereditary risk for cancer in the family.  The cancers in Ms. Debroux and/or her family may be sporadic/familial or due to other genetic and environmental factors.  Most cancer is not hereditary.  There may be a gene mutation in one of these genes that current testing methods cannot detect but that chance is small. There could be another gene that has not yet been discovered, or that we have not yet tested, that is responsible for the cancer diagnoses in the family.  It is also possible there is a hereditary cause for the cancer in  the family that Ms. Frary did not inherit.   Therefore, it is important to remain in touch with cancer genetics in the future so that we can continue to offer Ms. Mennen the most up to date genetic testing.   ADDITIONAL GENETIC TESTING:   Ms. Devivo genetic testing was fairly extensive.  If there are additional relevant genes identified to increase cancer risk that can be analyzed in the future, we would be happy to discuss and coordinate this testing at that time.     CANCER SCREENING RECOMMENDATIONS:  Ms. Aicher test result is considered negative (normal).  This means that we have not identified a hereditary cause for her personal history of breast cancer at this time.   An individual's cancer risk and medical management are not determined by genetic test results alone. Overall cancer risk assessment incorporates additional factors, including personal medical history, family history, and any available genetic information that may result in a personalized plan for cancer prevention and surveillance. Therefore, it is recommended she continue to follow the cancer management and screening guidelines provided by her oncology and primary healthcare provider.  RECOMMENDATIONS FOR FAMILY MEMBERS:   Individuals in this family might be at some increased risk of developing cancer, over the general population risk, due to the family history of cancer.  Individuals in the family should notify their providers of the family history of cancer. We recommend women in this family have a yearly mammogram beginning at age 25, or 69 years younger than the earliest onset of cancer, an annual clinical breast exam, and perform monthly breast self-exams.  Risk models that take into account family history and hormonal history may be helpful in determining appropriate breast cancer screening options for family members.  Other members of the family may still carry a pathogenic variant in one of these genes that  Ms. Marian did not inherit. Based on the family history, we recommend first degree relatives of her paternal grandfather, who was diagnosed with metastatic prostate cancer, have genetic counseling and testing. Ms. Krinsky can let us  know if we can be of any assistance in coordinating genetic counseling and/or testing for these family member.     FOLLOW-UP:  Cancer genetics is a rapidly advancing field and it is possible that new genetic tests will be appropriate for her and/or her family members in the future. We encourage Ms. Gato to remain in contact with cancer genetics, so we can update her personal and family histories and let her know of advances in cancer genetics that may benefit this family.   Our contact number was provided.  They are welcome to call us  at anytime with additional questions or concerns.   Kaiya Boatman M. Nydia, MS, [facility] Genetic Counselor [email or web address] (P) [phone]

## 2023-09-07 NOTE — Telephone Encounter (Signed)
 Disclosed negative genetics.

## 2023-09-11 ENCOUNTER — Other Ambulatory Visit: Payer: Self-pay | Admitting: Nurse Practitioner

## 2023-09-11 DIAGNOSIS — G4700 Insomnia, unspecified: Secondary | ICD-10-CM

## 2023-09-11 MED ORDER — ESZOPICLONE 2 MG PO TABS: ORAL_TABLET | ORAL | 5 refills | Status: DC

## 2023-09-12 DIAGNOSIS — K5190 Ulcerative colitis, unspecified, without complications: Secondary | ICD-10-CM | POA: Diagnosis not present

## 2023-09-12 DIAGNOSIS — M459 Ankylosing spondylitis of unspecified sites in spine: Secondary | ICD-10-CM | POA: Diagnosis not present

## 2023-09-20 ENCOUNTER — Encounter: Payer: Self-pay | Admitting: Nurse Practitioner

## 2023-09-20 ENCOUNTER — Ambulatory Visit: Payer: 59 | Admitting: Nurse Practitioner

## 2023-09-20 ENCOUNTER — Ambulatory Visit: Payer: 59

## 2023-09-20 VITALS — BP 118/78 | HR 91 | Temp 98.4°F | Ht 67.0 in | Wt 199.0 lb

## 2023-09-20 VITALS — BP 118/78 | HR 91 | Temp 98.4°F | Ht 64.6 in | Wt 199.2 lb

## 2023-09-20 DIAGNOSIS — E66811 Obesity, class 1: Secondary | ICD-10-CM

## 2023-09-20 DIAGNOSIS — Z0000 Encounter for general adult medical examination without abnormal findings: Secondary | ICD-10-CM | POA: Diagnosis not present

## 2023-09-20 DIAGNOSIS — Z23 Encounter for immunization: Secondary | ICD-10-CM

## 2023-09-20 DIAGNOSIS — E6609 Other obesity due to excess calories: Secondary | ICD-10-CM | POA: Diagnosis not present

## 2023-09-20 DIAGNOSIS — I10 Essential (primary) hypertension: Secondary | ICD-10-CM

## 2023-09-20 DIAGNOSIS — Z6831 Body mass index (BMI) 31.0-31.9, adult: Secondary | ICD-10-CM

## 2023-09-20 DIAGNOSIS — E559 Vitamin D deficiency, unspecified: Secondary | ICD-10-CM

## 2023-09-20 MED ORDER — VITAMIN D (ERGOCALCIFEROL) 1.25 MG (50000 UNIT) PO CAPS: 50000.0000 [IU] | ORAL_CAPSULE | ORAL | 2 refills | Status: DC

## 2023-09-20 MED ORDER — PHENTERMINE HCL 15 MG PO CAPS: 15.0000 mg | ORAL_CAPSULE | ORAL | 1 refills | Status: DC

## 2023-09-20 NOTE — Patient Instructions (Signed)
 Goal to exercise 150 minutes per week with at least 2 days of strength training. You can do small changes with a jump rope and squats with low weights.  Encouraged to park further when at the store, take stairs instead of elevators and to walk in place during commercials. Increase water intake to at least one gallon of water daily. Cut out your potato chips

## 2023-09-20 NOTE — Assessment & Plan Note (Addendum)
ASK- Given permission to discuss weight today ASSESS - Body mass index is 31.17 kg/m., well tolerated, WAIST CIRCUMFERENCE 39 inches ADVISE  - on risk of cardiovascular disease currently has a diagnosis of hypertension, on medications. AGREE - to increase her physical activity of walking back to walking daily at least 150 minutes per week ASSIST -  feels barriers have been related to just getting off track, will also start on low dose phentermine, discussed side effects to include increased heart rate and dry mouth

## 2023-09-20 NOTE — Progress Notes (Signed)
Madelaine Bhat, CMA,acting as a Neurosurgeon for Arnette Felts, FNP.,have documented all relevant documentation on the behalf of Arnette Felts, FNP,as directed by  Arnette Felts, FNP while in the presence of Arnette Felts, FNP.  Subjective:  Patient ID: Gabrielle Hawkins , female    DOB: [birth year] , 58 y.o.   MRN: [id number]  Chief Complaint  Patient presents with   Hypertension    HPI  Patient presents today for a bp follow up, Patient reports compliance with medication. Patient denies any chest pain, SOB, or headaches. Patient has no concerns today. She is wanting to lose more weight. She is trying to eat healthier. She reports her weakness is potato chips. She reports drinking adequate amounts of water. She reports her creatine was slightly elevated and was told to limit her aleve intake.    Past Medical History:  Diagnosis Date   Anemia 1998   Anxiety    Arthritis    big toe    Asthma    BRCA1 negative    BRCA2 negative    Breast cancer (HCC) 2006   chem/radiation/2years tamoxifen   Chronic neck and back pain    Crohn's disease (HCC)    Depression    Endometriosis    Fibroid    GERD (gastroesophageal reflux disease)    History of chemotherapy 01/2005   Hx of tamoxifen therapy    Hx of transfusion of packed red blood cells    Hypertension    IBS (irritable bowel syndrome)    Kidney stones    Lymphedema of arm    Migraines    just at dx of breast CA   Mucinous cystadenoma of ovary    left   Neuropathy    Paresthesias    Peripheral neuropathy    Personal history of chemotherapy    Personal history of radiation therapy    Radiation 04/2005   Ulcerative colitis (HCC)    with chronic diarrhea   Urinary tract infection    x 3   Vertigo 2014   Vitamin D deficiency      Family History  Problem Relation Age of Onset   Breast cancer Mother 11       2nd primary dx 9; neg GT   Heart failure Father    Heart disease Father    Leukemia Father 67       ? chronic;  survelliance only   Colon cancer Maternal Grandmother        d. 13s   Heart failure Paternal Grandmother    Heart disease Paternal Grandmother    Cervical cancer Paternal Grandmother        dx 90s   Prostate cancer Paternal Grandfather        dx >60; mets to bone   Cancer Other        MGF's nephews w/ prostate and pancreatic cancer; MGF"s niece w/ lung cancer; MGF's sister w/ unk cancer   Breast cancer Maternal Great-grandmother        d. >20; MGF's mother     Current Outpatient Medications:    albuterol (VENTOLIN HFA) 108 (90 Base) MCG/ACT inhaler, Inhale 2 puffs into the lungs every 6 (six) hours as needed for wheezing or shortness of breath., Disp: 8.5 g, Rfl: 2   amitriptyline (ELAVIL) 50 MG tablet, Take 1 tablet (50 mg total) by mouth at bedtime., Disp: 90 tablet, Rfl: 0   atorvastatin (LIPITOR) 20 MG tablet, Take 1 tablet (20 mg total) by mouth daily., Disp:  30 tablet, Rfl: 11   BREO ELLIPTA 200-25 MCG/ACT AEPB, Inhale into the lungs., Disp: , Rfl:    busPIRone (BUSPAR) 5 MG tablet, Take 1 tablet (5 mg total) by mouth at bedtime., Disp: 90 tablet, Rfl: 1   EPINEPHrine (EPIPEN 2-PAK) 0.3 mg/0.3 mL IJ SOAJ injection, Inject 0.3 mg into the muscle as needed for anaphylaxis., Disp: 1 each, Rfl: 2   eszopiclone (LUNESTA) 2 MG TABS tablet, Take immediately before bedtime, Disp: 30 tablet, Rfl: 5   folic acid (FOLVITE) 1 MG tablet, Take 3 mg by mouth daily., Disp: , Rfl:    golimumab (SIMPONI ARIA) 50 MG/4ML SOLN injection, 50 mg See admin instructions. Infusion every 8 weeks, [facility], Disp: , Rfl:    hydroxychloroquine (PLAQUENIL) 200 MG tablet, Take 200 mg by mouth 2 (two) times daily., Disp: , Rfl:    levocetirizine (XYZAL) 5 MG tablet, TAKE ONE TABLET BY MOUTH EVERY EVENING, Disp: 30 tablet, Rfl: 5   methotrexate (50 MG/ML) 1 g injection, Inject into the vein every Monday. 0.64ml every Monday, Disp: , Rfl:    Multiple Vitamins-Minerals (ONE A DAY WOMEN 50 PLUS) CHEW,  Chew 1 tablet by mouth daily., Disp: , Rfl:    olmesartan (BENICAR) 20 MG tablet, Take 1 tablet (20 mg total) by mouth daily., Disp: 90 tablet, Rfl: 1   omeprazole (PRILOSEC) 40 MG capsule, Take 1 capsule (40 mg total) by mouth daily., Disp: 30 capsule, Rfl: 3   OVER THE COUNTER MEDICATION, Aleve cream for pain, Disp: , Rfl:    phentermine 15 MG capsule, Take 1 capsule (15 mg total) by mouth every morning., Disp: 30 capsule, Rfl: 1   tiZANidine (ZANAFLEX) 2 MG tablet, Take 2 mg by mouth 3 (three) times daily., Disp: , Rfl:    Vitamin D, Ergocalciferol, (DRISDOL) 1.25 MG (50000 UNIT) CAPS capsule, Take 1 capsule (50,000 Units total) by mouth every 7 (seven) days., Disp: 24 capsule, Rfl: 2   Allergies  Allergen Reactions   Humira [Adalimumab] Anaphylaxis   Ampicillin Hives and Itching   Codeine    Compazine [Prochlorperazine Maleate] Other (See Comments)    "Stroke-like symptoms" Can tolerate Phenergan.   Cymbalta [Duloxetine Hcl] Other (See Comments)    Fainting   Imitrex [Sumatriptan Base] Hives and Nausea And Vomiting   Lyrica [Pregabalin] Other (See Comments)    Fainting   Metoclopramide Hcl Hives   Percocet [Oxycodone-Acetaminophen] Itching   Effexor [Venlafaxine Hydrochloride] Hives and Palpitations   Fentanyl Nausea And Vomiting   Tape Itching and Rash     Review of Systems  Constitutional: Negative.   HENT: Negative.    Eyes: Negative.   Respiratory: Negative.    Cardiovascular: Negative.   Gastrointestinal: Negative.   Neurological: Negative.   Psychiatric/Behavioral: Negative.       Today's Vitals   09/20/23 0904  BP: 118/78  Pulse: 91  Temp: 98.4 F (36.9 C)  TempSrc: Oral  Weight: 199 lb (90.3 kg)  Height: 5\' 7"  (1.702 m)  PainSc: 0-No pain   Body mass index is 31.17 kg/m.  Wt Readings from Last 3 Encounters:  09/20/23 199 lb (90.3 kg)  09/20/23 199 lb 3.2 oz (90.4 kg)  07/17/23 199 lb 12.8 oz (90.6 kg)    Objective:  Physical Exam Vitals reviewed.   Constitutional:      General: She is not in acute distress.    Appearance: Normal appearance. She is obese.  Eyes:     Pupils: Pupils are equal, round, and reactive  to light.  Cardiovascular:     Rate and Rhythm: Normal rate and regular rhythm.     Pulses: Normal pulses.     Heart sounds: Normal heart sounds. No murmur heard. Pulmonary:     Effort: Pulmonary effort is normal. No respiratory distress.     Breath sounds: Normal breath sounds. No wheezing.  Skin:    General: Skin is warm and dry.     Capillary Refill: Capillary refill takes less than 2 seconds.  Neurological:     General: No focal deficit present.     Mental Status: She is alert and oriented to person, place, and time.     Cranial Nerves: No cranial nerve deficit.     Motor: No weakness.  Psychiatric:        Mood and Affect: Mood normal.        Behavior: Behavior normal.        Thought Content: Thought content normal.        Judgment: Judgment normal.         Assessment And Plan:  Essential hypertension Assessment & Plan: Blood pressure is well controlled, continue current medications  Orders: -     Microalbumin / creatinine urine ratio  Vitamin D deficiency Assessment & Plan: Will check vitamin D level and supplement as needed.    Also encouraged to spend 15 minutes in the sun daily.    Orders: -     Vitamin D (Ergocalciferol); Take 1 capsule (50,000 Units total) by mouth every 7 (seven) days.  Dispense: 24 capsule; Refill: 2  Need for vaccination Assessment & Plan: Prevnar 20 given in office  Orders: -     Pneumococcal conjugate vaccine 20-valent  Class 1 obesity due to excess calories without serious comorbidity with body mass index (BMI) of 31.0 to 31.9 in adult Assessment & Plan: ASK- Given permission to discuss weight today ASSESS - Body mass index is 31.17 kg/m., well tolerated, WAIST CIRCUMFERENCE 39 inches ADVISE  - on risk of cardiovascular disease currently has a diagnosis of  hypertension, on medications. AGREE - to increase her physical activity of walking back to walking daily at least 150 minutes per week ASSIST -  feels barriers have been related to just getting off track, will also start on low dose phentermine, discussed side effects to include increased heart rate and dry mouth   Orders: -     Phentermine HCl; Take 1 capsule (15 mg total) by mouth every morning.  Dispense: 30 capsule; Refill: 1  Encounter for Prevnar pneumococcal vaccination Assessment & Plan: Prevnar 20 given in office     Return for 8-10 week weight check.  Patient was given opportunity to ask questions. Patient verbalized understanding of the plan and was able to repeat key elements of the plan. All questions were answered to their satisfaction.    Jeanell Sparrow, FNP, have reviewed all documentation for this visit. The documentation on 09/20/23 for the exam, diagnosis, procedures, and orders are all accurate and complete.   IF YOU HAVE BEEN REFERRED TO A SPECIALIST, IT MAY TAKE 1-2 WEEKS TO SCHEDULE/PROCESS THE REFERRAL. IF YOU HAVE NOT HEARD FROM US/SPECIALIST IN TWO WEEKS, PLEASE GIVE [place] A CALL AT [phone].

## 2023-09-20 NOTE — Progress Notes (Signed)
 Subjective:   Gabrielle Hawkins is a 58 y.o. female who presents for Medicare Annual (Subsequent) preventive examination.  Visit Complete: In person    Cardiac Risk Factors include: hypertension;obesity (BMI >30kg/m2)     Objective:    Today's Vitals   09/20/23 0840  BP: 118/78  Pulse: 91  Temp: 98.4 F (36.9 C)  TempSrc: Oral  SpO2: 97%  Weight: 199 lb 3.2 oz (90.4 kg)  Height: 5' 4.6" (1.641 m)   Body mass index is 33.56 kg/m.     09/20/2023    8:46 AM 08/24/2022    8:17 AM 01/13/2022    9:27 AM 01/06/2022   10:31 AM 11/15/2021   11:23 AM 11/14/2021    3:19 PM 09/21/2021    1:38 PM  Advanced Directives  Does Patient Have a Medical Advance Directive? No No No No No No No  Does patient want to make changes to medical advance directive?       No - Patient declined  Would patient like information on creating a medical advance directive? No - Patient declined  No - Patient declined No - Patient declined No - Patient declined No - Patient declined     Current Medications (verified) Outpatient Encounter Medications as of 09/20/2023  Medication Sig   albuterol  (VENTOLIN  HFA) 108 (90 Base) MCG/ACT inhaler Inhale 2 puffs into the lungs every 6 (six) hours as needed for wheezing or shortness of breath.   amitriptyline  (ELAVIL ) 50 MG tablet Take 1 tablet (50 mg total) by mouth at bedtime.   atorvastatin  (LIPITOR) 20 MG tablet Take 1 tablet (20 mg total) by mouth daily.   BREO ELLIPTA  200-25 MCG/ACT AEPB Inhale into the lungs.   busPIRone  (BUSPAR ) 5 MG tablet Take 1 tablet (5 mg total) by mouth at bedtime.   EPINEPHrine  (EPIPEN  2-PAK) 0.3 mg/0.3 mL IJ SOAJ injection Inject 0.3 mg into the muscle as needed for anaphylaxis.   eszopiclone  (LUNESTA ) 2 MG TABS tablet Take immediately before bedtime   folic acid (FOLVITE) 1 MG tablet Take 3 mg by mouth daily.   golimumab  (SIMPONI  ARIA) 50 MG/4ML SOLN injection 50 mg See admin instructions. Infusion every 8 weeks, [facility]   hydroxychloroquine (PLAQUENIL) 200 MG tablet Take 200 mg by mouth 2 (two) times daily.   levocetirizine (XYZAL ) 5 MG tablet TAKE ONE TABLET BY MOUTH EVERY EVENING   methotrexate (50 MG/ML) 1 g injection Inject into the vein every Monday. 0.74ml every Monday   Multiple Vitamins-Minerals (ONE A DAY WOMEN 50 PLUS) CHEW Chew 1 tablet by mouth daily.   olmesartan  (BENICAR ) 20 MG tablet Take 1 tablet (20 mg total) by mouth daily.   omeprazole  (PRILOSEC) 40 MG capsule Take 1 capsule (40 mg total) by mouth daily.   OVER THE COUNTER MEDICATION Aleve  cream for pain   tiZANidine (ZANAFLEX) 2 MG tablet Take 2 mg by mouth 3 (three) times daily.   Vitamin D , Ergocalciferol , (DRISDOL ) 1.25 MG (50000 UNIT) CAPS capsule Take 1 capsule (50,000 Units total) by mouth every 7 (seven) days.   [DISCONTINUED] inFLIXimab  (REMICADE ) 100 MG injection Inject 100 mg into the vein every 30 (thirty) days. Infuse by intravenous route every 4 weeks  Patient is receiving Inflectra  brand verses Remicade  brand due to insurance (Patient not taking: Reported on 04/10/2020)   [DISCONTINUED] temazepam  (RESTORIL ) 7.5 MG capsule Take 1 capsule (7.5 mg total) by mouth at bedtime as needed for sleep. (Patient not taking: Reported on 07/09/2020)   No facility-administered encounter medications  on file as of 09/20/2023.    Allergies (verified) Humira [adalimumab], Ampicillin, Codeine, Compazine [prochlorperazine maleate], Cymbalta [duloxetine hcl], Imitrex [sumatriptan base], Lyrica [pregabalin], Metoclopramide hcl, Percocet [oxycodone -acetaminophen ], Effexor [venlafaxine hydrochloride], Fentanyl , and Tape   History: Past Medical History:  Diagnosis Date   Anemia 1998   Anxiety    Arthritis    big toe    Asthma    BRCA1 negative    BRCA2 negative    Breast cancer (HCC) 2006   chem/radiation/2years tamoxifen   Chronic neck and back pain    Crohn's disease (HCC)    Depression    Endometriosis    Fibroid    GERD  (gastroesophageal reflux disease)    History of chemotherapy 01/2005   Hx of tamoxifen therapy    Hx of transfusion of packed red blood cells    Hypertension    IBS (irritable bowel syndrome)    Kidney stones    Lymphedema of arm    Migraines    just at dx of breast CA   Mucinous cystadenoma of ovary    left   Neuropathy    Paresthesias    Peripheral neuropathy    Personal history of chemotherapy    Personal history of radiation therapy    Radiation 04/2005   Ulcerative colitis (HCC)    with chronic diarrhea   Urinary tract infection    x 3   Vertigo 2014   Vitamin D  deficiency    Past Surgical History:  Procedure Laterality Date   ABDOMINAL HYSTERECTOMY  09/05/2005   TAH/BSO   ANTERIOR INTEROSSEOUS NERVE DECOMPRESSION Right 01/13/2022   Procedure: ANTERIOR INTEROSSEOUS NERVE DECOMPRESSION;  Surgeon: Micheline Ahr, MD;  Location: [facility];  Service: Orthopedics;  Laterality: Right;   BREAST LUMPECTOMY     BREAST SURGERY  09/05/2004   LUMPECTOMY   CHOLECYSTECTOMY     CHOLECYSTECTOMY N/A 04/29/2014   Procedure: LAPAROSCOPIC CHOLECYSTECTOMY WITH INTRAOPERATIVE CHOLANGIOGRAM;  Surgeon: Quitman Bucy, MD;  Location: [facility];  Service: General;  Laterality: N/A;   COLONOSCOPY  02/04/2012   Dr. Nickey Barn: sessile serrated adenoma   COLONOSCOPY WITH PROPOFOL  N/A 03/13/2015   Procedure: COLONOSCOPY WITH PROPOFOL ;  Surgeon: Alvis Jourdain, MD;  Location: [facility];  Service: Endoscopy;  Laterality: N/A;   DILATION AND CURETTAGE OF UTERUS     ELBOW LIGAMENT RECONSTRUCTION Right 11/15/2021   Procedure: ELBOW LIGAMENT REPAIR/ LCL;  Surgeon: Micheline Ahr, MD;  Location: [facility];  Service: Orthopedics;  Laterality: Right;  Regional block   FLEXIBLE SIGMOIDOSCOPY N/A 07/18/2014   Dr. Nickey Barn: mild left-sided colitis, biopsies with quiescent UC   HARDWARE REMOVAL Right 01/13/2022   Procedure: HARDWARE REMOVAL/ELBOW;  Surgeon: Micheline Ahr, MD;   Location: [facility];  Service: Orthopedics;  Laterality: Right;   MYOMECTOMY     ORIF RADIAL FRACTURE Right 11/15/2021   Procedure: RIGHT RADIAL HEAD ARTHROPLASTY;  Surgeon: Micheline Ahr, MD;  Location: [facility];  Service: Orthopedics;  Laterality: Right;  Regional block   ORIF ULNAR FRACTURE Right 11/15/2021   Procedure: OPEN REDUCTION INTERNAL FIXATION (ORIF) RIGHT ULNAR FRACTURE;  Surgeon: Micheline Ahr, MD;  Location: [facility];  Service: Orthopedics;  Laterality: Right;  Regional block   TONSILLECTOMY     Family History  Problem Relation Age of Onset   Breast cancer Mother 52       2nd primary dx 49; neg GT   Heart failure Father  Heart disease Father    Leukemia Father 74       ? chronic; survelliance only   Colon cancer Maternal Grandmother        d. 49s   Heart failure Paternal Grandmother    Heart disease Paternal Grandmother    Cervical cancer Paternal Grandmother        dx 90s   Prostate cancer Paternal Grandfather        dx >60; mets to bone   Cancer Other        MGF's nephews w/ prostate and pancreatic cancer; MGF"s niece w/ lung cancer; MGF's sister w/ unk cancer   Breast cancer Maternal Great-grandmother        d. 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     Patient has 2 years of college education.    Patient has 0 children.    Patient has a boyfriend.          Social Drivers of Corporate investment banker Strain: Low Risk  (09/20/2023)   Overall Financial Resource Strain (CARDIA)    Difficulty of Paying Living Expenses: Not hard at all  Food Insecurity: No Food Insecurity (09/20/2023)   Hunger Vital Sign    Worried About Running Out of Food in the Last Year: Never true    Ran Out of Food in the Last Year: Never true  Transportation Needs: No Transportation Needs (09/20/2023)   PRAPARE - Administrator, Civil Service (Medical): No    Lack of Transportation (Non-Medical): No  Physical Activity: Sufficiently Active (09/20/2023)   Exercise Vital Sign    Days of Exercise per Week: 7 days    Minutes of Exercise per Session: 30 min  Stress: Stress Concern Present (09/20/2023)   [employer] of Occupational Health - Occupational Stress Questionnaire    Feeling of Stress : To some extent  Social Connections: Moderately Integrated (09/20/2023)   Social Connection and Isolation Panel [NHANES]    Frequency of Communication with Friends and Family: More than three times a week    Frequency of Social Gatherings with Friends and Family: More than three times a week    Attends Religious Services: More than 4 times per year    Active Member of [employer] or Organizations: Yes    Attends Engineer, structural: More than 4 times per year    Marital Status: Divorced    Tobacco Counseling Counseling given: Not Answered   Clinical Intake:  Pre-visit preparation completed: Yes  Pain : No/denies pain     Nutritional Status: BMI > 30  Obese Nutritional Risks: None Diabetes: No  How often do you need to have someone help you when you  read instructions, pamphlets, or other written materials from your doctor or pharmacy?: 1 - Never  Interpreter Needed?: No  Information entered by :: NAllen LPN   Activities of Daily  Living    09/20/2023    8:41 AM  In your present state of health, do you have any difficulty performing the following activities:  Hearing? 0  Vision? 0  Difficulty concentrating or making decisions? 0  Walking or climbing stairs? 0  Dressing or bathing? 0  Doing errands, shopping? 0  Preparing Food and eating ? N  Using the Toilet? N  In the past six months, have you accidently leaked urine? N  Do you have problems with loss of bowel control? N  Managing your Medications? N  Managing your Finances? N  Housekeeping or managing your Housekeeping? N    Patient Care Team: Susanna Epley, FNP as PCP - General (General Practice) Bridgette Campus, MD as PCP - Cardiology (Cardiology) Stefan Edge, MD as Consulting Physician (Rheumatology) Nathen Balder, Skeeter Dukes, RN as [facility] Care Management Adriana Albany, LCSW as Social Worker (Licensed Clinical Social Worker)  Indicate any recent [employer] you may have received from other than [facility] providers in the past year (date may be approximate).     Assessment:   This is a routine wellness examination for Starr.  Hearing/Vision screen Hearing Screening - Comments:: Denies hearing issues Vision Screening - Comments:: No regular eye exams,    Goals Addressed             This Visit's Progress    Patient Stated       09/20/2023, wants to lose weight       Depression Screen    09/20/2023    8:48 AM 11/18/2022    1:00 PM 08/24/2022    8:19 AM 05/30/2022    9:50 AM 09/27/2021   11:45 AM 04/21/2021    3:13 PM 11/17/2020    2:38 PM  PHQ 2/9 Scores  PHQ - 2 Score 0 0 0 0 4 0 3  PHQ- 9 Score     15  12    Fall Risk    09/20/2023    8:47 AM 11/18/2022    1:00 PM 08/24/2022    8:18 AM 08/23/2022    5:19 PM 05/30/2022    9:49 AM  Fall Risk   Falls in the past year? 0 0 1 1 1   Comment   slipped on the ice    Number falls in past yr: 0 0 0 0 1  Injury with Fall? 0 0 1 1 1   Comment   broke arm    Risk for fall  due to : Medication side effect No Fall Risks Medication side effect  History of fall(s)  Follow up Falls prevention discussed;Falls evaluation completed Falls evaluation completed Falls evaluation completed;Education provided;Falls prevention discussed  Falls evaluation completed    MEDICARE RISK AT HOME: Medicare Risk at Home Any stairs in or around the home?: Yes If so, are there any without handrails?: No Home free of loose throw rugs in walkways, pet beds, electrical cords, etc?: Yes Adequate lighting in your home to reduce risk of falls?: Yes Life alert?: No Use of a cane, walker or w/c?: No Grab bars in the bathroom?: No Shower chair or bench in shower?: No Elevated toilet seat or a handicapped toilet?: Yes  TIMED UP AND GO:  Was the test performed?  Yes  Length of time to ambulate 10 feet: 5  sec Gait steady and fast without use of assistive device    Cognitive Function:        09/20/2023    8:49 AM 08/24/2022    8:20 AM  6CIT Screen  What Year? 0 points 0 points  What month? 0 points 0 points  What time? 0 points 0 points  Count back from 20 0 points 0 points  Months in reverse 4 points 0 points  Repeat phrase 0 points 0 points  Total Score 4 points 0 points    Immunizations Immunization History  Administered Date(s) Administered   Influenza, Seasonal, Injecte, Preservative Fre 05/30/2023   Influenza,inj,Quad PF,6+ Mos 06/04/2019, 05/30/2022   Influenza-Unspecified 08/09/2018, 06/08/2020   Moderna Covid-19 Fall Seasonal Vaccine 56yrs & older 08/25/2022   Moderna Sars-Covid-2 Vaccination 11/13/2019, 12/11/2019, 06/08/2020   Tdap 05/01/2021    TDAP status: Up to date  Flu Vaccine status: Up to date  Pneumococcal vaccine status: Due, Education has been provided regarding the importance of this vaccine. Advised may receive this vaccine at local pharmacy or Health Dept. Aware to provide a copy of the vaccination record if obtained from local pharmacy or Health  Dept. Verbalized acceptance and understanding.  Covid-19 vaccine status: Information provided on how to obtain vaccines.   Qualifies for Shingles Vaccine? Yes   Zostavax completed No   Shingrix  Completed?: No.    Education has been provided regarding the importance of this vaccine. Patient has been advised to call insurance company to determine out of pocket expense if they have not yet received this vaccine. Advised may also receive vaccine at local pharmacy or Health Dept. Verbalized acceptance and understanding.  Screening Tests Health Maintenance  Topic Date Due   Pneumococcal Vaccine 15-29 Years old (1 of 2 - PCV) Never done   HIV Screening  Never done   Cervical Cancer Screening (Pap smear)  12/23/2011   COVID-19 Vaccine (5 - 2024-25 season) 10/06/2023 (Originally 05/07/2023)   Zoster Vaccines- Shingrix  (1 of 2) 12/19/2023 (Originally 10/22/1986)   MAMMOGRAM  08/13/2024   Medicare Annual Wellness (AWV)  09/19/2024   DTaP/Tdap/Td (2 - Td or Tdap) 05/02/2031   Colonoscopy  01/09/2033   INFLUENZA VACCINE  Completed   Hepatitis C Screening  Completed   HPV VACCINES  Aged Out    Health Maintenance  Health Maintenance Due  Topic Date Due   Pneumococcal Vaccine 3-58 Years old (1 of 2 - PCV) Never done   HIV Screening  Never done   Cervical Cancer Screening (Pap smear)  12/23/2011    Colorectal cancer screening: Type of screening: Colonoscopy. Completed 01/10/2023. Repeat every 10 years  Mammogram status: Completed 08/14/2023. Repeat every year  Bone Density status: n/a  Lung Cancer Screening: (Low Dose CT Chest recommended if Age 56-80 years, 20 pack-year currently smoking OR have quit w/in 15years.) does not qualify.   Lung Cancer Screening Referral: no  Additional Screening:  Hepatitis C Screening: does qualify; Completed 11/17/2020  Vision Screening: Recommended annual ophthalmology exams for early detection of glaucoma and other disorders of the eye. Is the patient up to  date with their annual eye exam?  No  Who is the provider or what is the name of the office in which the patient attends annual eye exams? none If pt is not established with a provider, would they like to be referred to a provider to establish care? No .   Dental Screening: Recommended annual dental exams for proper oral hygiene  Diabetic Foot Exam: n/a  Community Resource Referral / Chronic Care Management: CRR required this visit?  No   CCM required this visit?  No     Plan:     I have personally reviewed and noted the following in the patient's chart:   Medical and social history Use of alcohol, tobacco or illicit drugs  Current medications and supplements including opioid prescriptions. Patient is not currently taking opioid prescriptions. Functional ability and status Nutritional status Physical activity Advanced directives List of other physicians Hospitalizations, surgeries, and ER visits in previous 12 months Vitals Screenings to include cognitive, depression, and falls Referrals and appointments  In addition, I have reviewed and discussed with patient certain preventive protocols, quality metrics, and best practice recommendations. A written personalized care plan for preventive services as well as general preventive health recommendations were provided to patient.     Areatha Beecham, LPN   10/05/8655   After Visit Summary: (In Person-Printed) AVS printed and given to the patient  Nurse Notes: none

## 2023-09-20 NOTE — Patient Instructions (Signed)
 Gabrielle Hawkins , Thank you for taking time to come for your Medicare Wellness Visit. I appreciate your ongoing commitment to your health goals. Please review the following plan we discussed and let me know if I can assist you in the future.   Referrals/Orders/Follow-Ups/Clinician Recommendations: none  This is a list of the screening recommended for you and due dates:  Health Maintenance  Topic Date Due   Pneumococcal Vaccination (1 of 2 - PCV) Never done   HIV Screening  Never done   Pap Smear  12/23/2011   COVID-19 Vaccine (5 - 2024-25 season) 10/06/2023*   Zoster (Shingles) Vaccine (1 of 2) 12/19/2023*   Mammogram  08/13/2024   Medicare Annual Wellness Visit  09/19/2024   DTaP/Tdap/Td vaccine (2 - Td or Tdap) 05/02/2031   Colon Cancer Screening  01/09/2033   Flu Shot  Completed   Hepatitis C Screening  Completed   HPV Vaccine  Aged Out  *Topic was postponed. The date shown is not the original due date.    Advanced directives: (Declined) Advance directive discussed with you today. Even though you declined this today, please call our office should you change your mind, and we can give you the proper paperwork for you to fill out.  Next Medicare Annual Wellness Visit scheduled for next year: Yes  insert Preventive Care attachment Insert FALL PREVENTION attachment if needed

## 2023-09-21 LAB — MICROALBUMIN / CREATININE URINE RATIO
Creatinine, Urine: 195.6 mg/dL
Microalb/Creat Ratio: 6 mg/g{creat} (ref 0–29)
Microalbumin, Urine: 12.7 ug/mL

## 2023-09-22 ENCOUNTER — Ambulatory Visit: Payer: Self-pay

## 2023-09-22 NOTE — Patient Instructions (Signed)
Visit Information  Thank you for taking time to visit with me today. Please don't hesitate to contact me if I can be of assistance to you.   Following are the goals we discussed today:   Goals Addressed               This Visit's Progress     Patient Stated     COMPLETED: I started Cholesterol medicine (pt-stated)        Care Coordination Interventions: Provider established cholesterol goals reviewed Reviewed role and benefits of statin for ASCVD risk reduction Reviewed importance of limiting foods high in cholesterol Reviewed exercise goals and target of 150 minutes per week  Educated patient about the PREP program, patient declines at this time due to the distance to the nearest [facility] Lipid Panel     Component Value Date/Time   CHOL 150 10/10/2022 1053   TRIG 51 10/10/2022 1053   HDL 71 10/10/2022 1053   CHOLHDL 2.1 10/10/2022 1053   CHOLHDL 3.5 12/24/2009 0817   VLDL 10 12/24/2009 0817   LDLCALC 68 10/10/2022 1053   LABVLDL 11 10/10/2022 1053         Other     COMPLETED: To complete genetic counseling and lab testing        Care Coordination Interventions: Evaluation of current treatment plan related to family history of breast cancer and patient's adherence to plan as established by provider Reviewed and discussed with patient she completed her genetic testing, her results were negative and she is very happy about this Discussed she may undergo additional genetic testing in the future but not at this time per the recommendations of her doctor       To keep ulcerative colitis under good control   On track     Care Coordination Interventions: Evaluation of current treatment plan related to Ulcerative Colitis  and patient's adherence to plan as established by provider  Reviewed and discussed with patient she continues to receive Simponi infusions every 8 weeks per Dr. Lendon Colonel Discussed patient experienced 2 Ulcerative Colitis flares since early January after learning her  mother suffered from a Stroke Discussed with patient she has made several dietary changes, increasing fruits and vegetables, she is eating [place] Yogurt daily Instructed patient to keep her doctor well informed of new symptoms or concerns Discussed plans with patient for ongoing care coordination follow up and provided patient with direct contact information for nurse care coordinator       To reduce stress and anxiety related to caregiver assistance   On track     Care Coordination Interventions: Evaluation of current treatment plan related to caregiver stress and anxiety  and patient's adherence to plan as established by provider Motivational Interviewing employed Active listening / Reflection utilized  Emotional Support Provided Caregiver stress acknowledged  Reviewed upcoming scheduled telephone visit with [name] LCSW scheduled for 10/09/23 @09 :00 AM             Work on losing weight        Care Coordination Interventions: Evaluation of current treatment plan related to weight loss  and patient's adherence to plan as established by provider Reviewed and discussed with patient her plan to increase her walking to daily, she has added more fruits and vegetables to her diet, she is eating [place] Light Yogurt daily  Reviewed and discussed new Rx for Phentermine 15 mg daily in the morning, educated patient about the indication, dosage, frequency and common SE to be aware of  Positive reinforcement given to patient for making efforts to improve her overall health Discussed patient will not participate in the PREP program due to the distance to the nearest [facility] is too far Discussed plans with patient for ongoing care coordination follow up and provided patient with direct contact information for nurse care coordinator Body mass index is 31.17 kg/m.     Wt Readings from Last 3 Encounters:  09/20/23 199 lb (90.3 kg)  09/20/23 199 lb 3.2 oz (90.4 kg)  07/17/23 199 lb 12.8 oz (90.6 kg)              Our next appointment is by telephone on 11/20/23 at 09:00 AM  Please call the care guide team at [phone] if you need to cancel or reschedule your appointment.   If you are experiencing a Mental Health or Behavioral Health Crisis or need someone to talk to, please call 1-800-273-TALK (toll free, 24 hour hotline)  Patient verbalizes understanding of instructions and care plan provided today and agrees to view in MyChart. Active MyChart status and patient understanding of how to access instructions and care plan via MyChart confirmed with patient.     Delsa Sale RN BSN CCM [place]  [facility], [facility] Nurse Care Coordinator  Direct Dial: [phone] Website: [email or web address]

## 2023-09-22 NOTE — Patient Outreach (Signed)
Care Coordination   Follow Up Visit Note   09/22/2023 Name: Gabrielle Hawkins MRN: [id number] DOB: Jun 07, 1966  Gabrielle Hawkins is a 58 y.o. year old female who sees Arnette Felts, FNP for primary care. I spoke with  [name] by phone today.  What matters to the patients health and wellness today?  Patient would like to work on being as healthy as she can.     Goals Addressed               This Visit's Progress     Patient Stated     COMPLETED: I started Cholesterol medicine (pt-stated)        Care Coordination Interventions: Provider established cholesterol goals reviewed Reviewed role and benefits of statin for ASCVD risk reduction Reviewed importance of limiting foods high in cholesterol Reviewed exercise goals and target of 150 minutes per week  Educated patient about the PREP program, patient declines at this time due to the distance to the nearest [facility] Lipid Panel     Component Value Date/Time   CHOL 150 10/10/2022 1053   TRIG 51 10/10/2022 1053   HDL 71 10/10/2022 1053   CHOLHDL 2.1 10/10/2022 1053   CHOLHDL 3.5 12/24/2009 0817   VLDL 10 12/24/2009 0817   LDLCALC 68 10/10/2022 1053   LABVLDL 11 10/10/2022 1053         Other     COMPLETED: To complete genetic counseling and lab testing        Care Coordination Interventions: Evaluation of current treatment plan related to family history of breast cancer and patient's adherence to plan as established by provider Reviewed and discussed with patient she completed her genetic testing, her results were negative and she is very happy about this Discussed she may undergo additional genetic testing in the future but not at this time per the recommendations of her doctor       To keep ulcerative colitis under good control   On track     Care Coordination Interventions: Evaluation of current treatment plan related to Ulcerative Colitis  and patient's adherence to plan as established by provider   Reviewed and discussed with patient she continues to receive Simponi infusions every 8 weeks per Dr. Lendon Colonel Discussed patient experienced 2 Ulcerative Colitis flares since early January after learning her mother suffered from a Stroke Discussed with patient she has made several dietary changes, increasing fruits and vegetables, she is eating [place] Yogurt daily Instructed patient to keep her doctor well informed of new symptoms or concerns Discussed plans with patient for ongoing care coordination follow up and provided patient with direct contact information for nurse care coordinator       To reduce stress and anxiety related to caregiver assistance   On track     Care Coordination Interventions: Evaluation of current treatment plan related to caregiver stress and anxiety  and patient's adherence to plan as established by provider Motivational Interviewing employed Active listening / Reflection utilized  Emotional Support Provided Caregiver stress acknowledged  Reviewed upcoming scheduled telephone visit with [name] LCSW scheduled for 10/09/23 @09 :00 AM      Work on losing weight        Care Coordination Interventions: Evaluation of current treatment plan related to weight loss  and patient's adherence to plan as established by provider Reviewed and discussed with patient her plan to increase her walking to daily, she has added more fruits and vegetables to her diet, she is eating [place]  Yogurt daily  Reviewed and discussed new Rx for Phentermine 15 mg daily in the morning, educated patient about the indication, dosage, frequency and common SE to be aware of  Positive reinforcement given to patient for making efforts to improve her overall health Discussed patient will not participate in the PREP program due to the distance to the nearest [facility] is too far Discussed plans with patient for ongoing care coordination follow up and provided patient with direct contact information for  nurse care coordinator Body mass index is 31.17 kg/m.     Wt Readings from Last 3 Encounters:  09/20/23 199 lb (90.3 kg)  09/20/23 199 lb 3.2 oz (90.4 kg)  07/17/23 199 lb 12.8 oz (90.6 kg)     Interventions Today    Flowsheet Row Most Recent Value  Chronic Disease   Chronic disease during today's visit Other  [Class  Obesity,  RA,  Ulcerative Colitis]  General Interventions   General Interventions Discussed/Reviewed General Interventions Discussed, General Interventions Reviewed, Labs, Doctor Visits, Communication with  Doctor Visits Discussed/Reviewed Doctor Visits Discussed, Doctor Visits Reviewed, PCP, Specialist  Communication with Social Work  [name] LCSW]  Exercise Interventions   Exercise Discussed/Reviewed Physical Activity, Weight Managment, Exercise Reviewed, Exercise Discussed  Physical Activity Discussed/Reviewed Physical Activity Reviewed, Types of exercise, Physical Activity Discussed, PREP  Weight Management Weight loss  Education Interventions   Education Provided Provided Education  Provided Verbal Education On Labs, Exercise, When to see the doctor, Medication, Nutrition, Mental Health/Coping with Illness  Mental Health Interventions   Mental Health Discussed/Reviewed Mental Health Discussed, Mental Health Reviewed, Coping Strategies, Refer to Social Work for resources  Refer to Social Work for resources regarding Warden/ranger with questions related to long term Medicaid application]  Nutrition Interventions   Nutrition Discussed/Reviewed Nutrition Discussed, Nutrition Reviewed, Portion sizes, Adding fruits and vegetables, Decreasing fats  Pharmacy Interventions   Pharmacy Dicussed/Reviewed Pharmacy Topics Discussed, Pharmacy Topics Reviewed, Medications and their functions          SDOH assessments and interventions completed:  No     Care Coordination Interventions:  Yes, provided   Follow up plan: Follow up call scheduled for 11/20/23 @09 :00  AM    Encounter Outcome:  Patient Visit Completed

## 2023-10-03 DIAGNOSIS — Z23 Encounter for immunization: Secondary | ICD-10-CM | POA: Insufficient documentation

## 2023-10-03 NOTE — Assessment & Plan Note (Signed)
Will check vitamin D level and supplement as needed.    Also encouraged to spend 15 minutes in the sun daily.

## 2023-10-03 NOTE — Assessment & Plan Note (Signed)
Prevnar 20 given in office

## 2023-10-03 NOTE — Assessment & Plan Note (Signed)
Blood pressure is well controlled, continue current medications.

## 2023-10-09 ENCOUNTER — Ambulatory Visit: Payer: Self-pay | Admitting: Licensed Clinical Social Worker

## 2023-10-09 ENCOUNTER — Other Ambulatory Visit: Payer: Self-pay | Admitting: Allergy & Immunology

## 2023-10-09 NOTE — Telephone Encounter (Signed)
Called to inform patient the refill has been sent and to schedule her follow up appointment.

## 2023-10-09 NOTE — Patient Instructions (Signed)
Visit Information  Thank you for taking time to visit with me today. Please don't hesitate to contact me if I can be of assistance to you.   Following are the goals we discussed today:   Goals Addressed             This Visit's Progress    Obtain Supportive Resources-Caregiver Fatigue   On track    Activities and task to complete in order to accomplish goals.   Keep all upcoming appointments discussed today Continue with compliance of taking medication prescribed by Doctor Implement healthy coping skills discussed to assist with management of symptoms Continue working with [facility] care team to assist with goals identified           Our next appointment is by telephone on 4/7 at 9 AM  Please call the care guide team at [phone] if you need to cancel or reschedule your appointment.   If you are experiencing a Mental Health or Behavioral Health Crisis or need someone to talk to, please call the Suicide and Crisis Lifeline: 988 call 911   Patient verbalizes understanding of instructions and care plan provided today and agrees to view in MyChart. Active MyChart status and patient understanding of how to access instructions and care plan via MyChart confirmed with patient.     Windy Fast [facility]  [facility], [facility] Clinical Social Worker Direct Dial: [phone]  Fax: [phone] Website: [email or web address] 2:36 PM

## 2023-10-09 NOTE — Patient Outreach (Signed)
  Care Coordination   Follow Up Visit Note   10/09/2023 Name: Gabrielle Hawkins MRN: [id number] DOB: Jun 29, 1966  Gabrielle Hawkins is a 58 y.o. year old female who sees Arnette Felts, FNP for primary care. I spoke with  [name] by phone today.  What matters to the patients health and wellness today?  Symptom Management    Goals Addressed             This Visit's Progress    Obtain Supportive Resources-Caregiver Fatigue   On track    Activities and task to complete in order to accomplish goals.   Keep all upcoming appointments discussed today Continue with compliance of taking medication prescribed by Doctor Implement healthy coping skills discussed to assist with management of symptoms Continue working with [facility] care team to assist with goals identified           SDOH assessments and interventions completed:  No     Care Coordination Interventions:  Yes, provided  Interventions Today    Flowsheet Row Most Recent Value  Chronic Disease   Chronic disease during today's visit Other  [Caregiver Stress, RA]  General Interventions   General Interventions Discussed/Reviewed General Interventions Reviewed, Doctor Visits, Community Resources  Doctor Visits Discussed/Reviewed Doctor Visits Reviewed  Mental Health Interventions   Mental Health Discussed/Reviewed Mental Health Reviewed, Coping Strategies, Anxiety  [Caregiver stress endorsed. Validation and encouragement provided. Strategies to assist with self-care and strenghten support discussed. Family has been assisting. Resources discussed]  Nutrition Interventions   Nutrition Discussed/Reviewed Nutrition Reviewed  Pharmacy Interventions   Pharmacy Dicussed/Reviewed Pharmacy Topics Reviewed, Medication Adherence  Safety Interventions   Safety Discussed/Reviewed Safety Reviewed       Follow up plan: Follow up call scheduled for 4-6 weeks    Encounter Outcome:  Patient Visit Completed   [name],  LCSW [place]  [facility], [facility] Clinical Social Worker Direct Dial: [phone]  Fax: [phone] Website: [email or web address] 2:35 PM

## 2023-11-01 ENCOUNTER — Ambulatory Visit: Payer: 59 | Admitting: Allergy & Immunology

## 2023-11-07 DIAGNOSIS — K5190 Ulcerative colitis, unspecified, without complications: Secondary | ICD-10-CM | POA: Diagnosis not present

## 2023-11-07 DIAGNOSIS — M459 Ankylosing spondylitis of unspecified sites in spine: Secondary | ICD-10-CM | POA: Diagnosis not present

## 2023-11-07 DIAGNOSIS — R7989 Other specified abnormal findings of blood chemistry: Secondary | ICD-10-CM | POA: Diagnosis not present

## 2023-11-13 ENCOUNTER — Telehealth: Payer: Self-pay | Admitting: Nurse Practitioner

## 2023-11-13 NOTE — Telephone Encounter (Signed)
 CALLED PT TO RESCHEDULE APPT DUE TO PROVIDER BEING OUT OF THE OFFICE NO ANSWER LEFT VM

## 2023-11-16 ENCOUNTER — Ambulatory Visit: Payer: 59 | Admitting: Nurse Practitioner

## 2023-11-20 ENCOUNTER — Ambulatory Visit: Payer: Self-pay

## 2023-11-20 ENCOUNTER — Encounter: Payer: Self-pay | Admitting: Nurse Practitioner

## 2023-11-20 NOTE — Patient Outreach (Signed)
 Care Coordination   Follow Up Visit Note   11/20/2023 Name: Gabrielle Hawkins MRN: [id number] DOB: 1965-12-28  Gabrielle Hawkins is a 58 y.o. year old female who sees Gabrielle Felts, FNP for primary care. I spoke with  Gabrielle Hawkins by phone today.  What matters to the patients health and wellness today?  Patient would like to have her infusions for UC moved to every 6 weeks for improved effectiveness.     Goals Addressed             This Visit's Progress    To keep ulcerative colitis under good control       Care Coordination Interventions: Evaluation of current treatment plan related to Ulcerative Colitis  and patient's adherence to plan as established by provider  Reviewed and discussed with patient she continues to receive Simponi infusions every 8 weeks per Dr. Lendon Colonel Discussed patient has experienced a UC flare over the weekend, she contributes to ongoing personal stressors Patient will discuss her persistent flares with her doctor during next scheduled appointment set for May, she will request to have her infusion frequency ordered for every 6 weeks  Active listening / Reflection utilized  Emotional Support Provided  Discussed plans with patient for ongoing care coordination follow up and provided patient with direct contact information for nurse care coordinator Scheduled nurse follow up call with patient for 12/19/23 @12  PM       To reduce stress and anxiety related to caregiver assistance       Care Coordination Interventions: Evaluation of current treatment plan related to caregiver stress and anxiety  and patient's adherence to plan as established by provider Motivational Interviewing employed Active listening / Reflection utilized  Emotional Support Provided Caregiver stress acknowledged  Reviewed upcoming scheduled telephone visit with Gabrielle Lucks LCSW scheduled for 12/11/23 @09 :00 AM    Interventions Today    Flowsheet Row Most Recent Value  Chronic  Disease   Chronic disease during today's visit Other  [caregiver stress]  General Interventions   General Interventions Discussed/Reviewed Doctor Visits, General Interventions Discussed, General Interventions Reviewed  Doctor Visits Discussed/Reviewed Doctor Visits Discussed, Doctor Visits Reviewed, PCP  Education Interventions   Education Provided Provided Education  Provided Verbal Education On When to see the doctor, Mental Health/Coping with Illness, Medication  Mental Health Interventions   Mental Health Discussed/Reviewed Mental Health Discussed, Mental Health Reviewed, Coping Strategies, Anxiety  Pharmacy Interventions   Pharmacy Dicussed/Reviewed Pharmacy Topics Discussed, Pharmacy Topics Reviewed, Medications and their functions          SDOH assessments and interventions completed:  No     Care Coordination Interventions:  Yes, provided   Follow up plan: Follow up call scheduled for 12/19/23 @12  PM    Encounter Outcome:  Patient Visit Completed

## 2023-11-20 NOTE — Patient Instructions (Signed)
 Visit Information  Thank you for taking time to visit with me today. Please don't hesitate to contact me if I can be of assistance to you.   Following are the goals we discussed today:   Goals Addressed             This Visit's Progress    To keep ulcerative colitis under good control       Care Coordination Interventions: Evaluation of current treatment plan related to Ulcerative Colitis  and patient's adherence to plan as established by provider  Reviewed and discussed with patient she continues to receive Simponi infusions every 8 weeks per Dr. Lendon Colonel Discussed patient has experienced a UC flare over the weekend, she contributes to ongoing personal stressors Patient will discuss her persistent flares with her doctor during next scheduled appointment set for May, she will request to have her infusion frequency ordered for every 6 weeks  Active listening / Reflection utilized  Emotional Support Provided  Discussed plans with patient for ongoing care coordination follow up and provided patient with direct contact information for nurse care coordinator Scheduled nurse follow up call with patient for 12/19/23 @12  PM       To reduce stress and anxiety related to caregiver assistance       Care Coordination Interventions: Evaluation of current treatment plan related to caregiver stress and anxiety  and patient's adherence to plan as established by provider Motivational Interviewing employed Active listening / Reflection utilized  Emotional Support Provided Caregiver stress acknowledged  Reviewed upcoming scheduled telephone visit with [name] LCSW scheduled for 12/11/23 @09 :00 AM         Our next appointment is by telephone on 12/19/23 at 12 PM  Please call the care guide team at [phone] if you need to cancel or reschedule your appointment.   If you are experiencing a Mental Health or Behavioral Health Crisis or need someone to talk to, please call 1-800-273-TALK (toll  free, 24 hour hotline)  Patient verbalizes understanding of instructions and care plan provided today and agrees to view in MyChart. Active MyChart status and patient understanding of how to access instructions and care plan via MyChart confirmed with patient.     Delsa Sale RN BSN CCM [place]  [facility], [facility] Health Nurse Care Coordinator  Direct Dial: [phone] Website: [email or web address]

## 2023-11-21 ENCOUNTER — Other Ambulatory Visit: Payer: Self-pay

## 2023-11-21 ENCOUNTER — Other Ambulatory Visit: Payer: Self-pay | Admitting: Nurse Practitioner

## 2023-11-21 DIAGNOSIS — E559 Vitamin D deficiency, unspecified: Secondary | ICD-10-CM

## 2023-11-21 MED ORDER — VITAMIN D (ERGOCALCIFEROL) 1.25 MG (50000 UNIT) PO CAPS: 50000.0000 [IU] | ORAL_CAPSULE | ORAL | 2 refills | Status: AC

## 2023-12-08 ENCOUNTER — Other Ambulatory Visit: Payer: Self-pay | Admitting: Nurse Practitioner

## 2023-12-08 DIAGNOSIS — I10 Essential (primary) hypertension: Secondary | ICD-10-CM

## 2023-12-08 DIAGNOSIS — F419 Anxiety disorder, unspecified: Secondary | ICD-10-CM

## 2023-12-11 ENCOUNTER — Ambulatory Visit: Payer: 59 | Admitting: Licensed Clinical Social Worker

## 2023-12-18 NOTE — Patient Outreach (Signed)
 Complex Care Management   Visit Note  12/11/2023  Name:  Gabrielle Hawkins MRN: [id number] DOB: 1966-07-20  Situation: Referral received for Complex Care Management related to  Caregiver Stress and Grief  I obtained verbal consent from Patient.  Visit completed with Patient  on the phone  Background:   Past Medical History:  Diagnosis Date   Anemia 1998   Anxiety    Arthritis    big toe    Asthma    BRCA1 negative    BRCA2 negative    Breast cancer (HCC) 2006   chem/radiation/2years tamoxifen   Chronic neck and back pain    Crohn's disease (HCC)    Depression    Endometriosis    Fibroid    GERD (gastroesophageal reflux disease)    History of chemotherapy 01/2005   Hx of tamoxifen therapy    Hx of transfusion of packed red blood cells    Hypertension    IBS (irritable bowel syndrome)    Kidney stones    Lymphedema of arm    Migraines    just at dx of breast CA   Mucinous cystadenoma of ovary    left   Neuropathy    Paresthesias    Peripheral neuropathy    Personal history of chemotherapy    Personal history of radiation therapy    Radiation 04/2005   Ulcerative colitis (HCC)    with chronic diarrhea   Urinary tract infection    x 3   Vertigo 2014   Vitamin D deficiency     Assessment: Patient Reported Symptoms:  Cognitive Cognitive Status: Alert and oriented to person, place, and time   Health Maintenance Behaviors: Spiritual practice(s), Social activities, Stress management  Neurological      HEENT HEENT Symptoms Reported: No symptoms reported      Cardiovascular Cardiovascular Symptoms Reported: No symptoms reported    Respiratory Respiratory Symptoms Reported: No symptoms reported    Endocrine Patient reports the following symptoms related to hypoglycemia or hyperglycemia : No symptoms reported    Gastrointestinal Gastrointestinal Symptoms Reported: No symptoms reported      Genitourinary      Integumentary Integumentary Symptoms Reported:  No symptoms reported    Musculoskeletal Musculoskelatal Symptoms Reviewed: No symptoms reported        Psychosocial Psychosocial Symptoms Reported: Sadness - if selected complete PHQ 2-9 Behavioral Management Strategies: Adequate rest, Support system Major Change/Loss/Stressor/Fears (CP): Death of a loved one, Medical condition, self Techniques to Cope with Loss/Stress/Change: Diversional activities, Spiritual practice(s) Quality of Family Relationships: involved, supportive Do you feel physically threatened by others?: No      09/20/2023    9:14 AM  Depression screen PHQ 2/9  Decreased Interest 0  Down, Depressed, Hopeless 0  PHQ - 2 Score 0  Altered sleeping 0  Tired, decreased energy 0  Change in appetite 0  Feeling bad or failure about yourself  0  Trouble concentrating 0  Moving slowly or fidgety/restless 0  Suicidal thoughts 0  PHQ-9 Score 0  Difficult doing work/chores Not difficult at all    There were no vitals filed for this visit.  Medications Reviewed Today   Medications were not reviewed in this encounter     Recommendation:   Continue utilizing strategies discussed to assist with symptom management  Follow Up Plan:   Telephone follow-up 2-4 weeks  Alease Hunter, LCSW [place]  [facility], [facility] Clinical Social Worker Direct Dial: [phone]  Fax: [phone] Website:  [email or web address] 4:52 PM

## 2023-12-18 NOTE — Patient Instructions (Signed)
 Visit Information  Thank you for taking time to visit with me today. Please don't hesitate to contact me if I can be of assistance to you before our next scheduled appointment.  Our next appointment is by telephone on 4/12 at 9 AM Please call the care guide team at [phone] if you need to cancel or reschedule your appointment.   Following is a copy of your care plan:   Goals Addressed             This Visit's Progress    LCSW [facility] Social Work Care Plan   On track    Problems:   Disease Management support and education needs related to Stress at Caregiving and Grief  CSW Clinical Goal(s):   Over the next 90 days the Patient will attend all scheduled medical appointments as evidenced by patient report and care team review of appointment completion in EMR:   demonstrate a reduction in symptoms related to Caregiver Stress Grief .  Interventions:  Mental Health:  Evaluation of current treatment plan related to Caregiver Stress and Grief Active listening / Reflection utilized Caregiver stress acknowledged :Discussed supportive resources Financial risk analyst / information provided Mindfulness or Relaxation training provided Solution-Focued Strategies employed:  Patient Goals/Self-Care Activities:  Increase coping skills and healthy habits  Plan:   Telephone follow up appointment with care management team member scheduled for:  2-4 weeks     COMPLETED: Obtain Supportive Resources-Caregiver Fatigue       Activities and task to complete in order to accomplish goals.   Keep all upcoming appointments discussed today Continue with compliance of taking medication prescribed by Doctor Implement healthy coping skills discussed to assist with management of symptoms Continue working with [facility] care team to assist with goals identified           Please call the Suicide and Crisis Lifeline: 988 call 911 if you are experiencing a Mental Health or Behavioral Health Crisis  or need someone to talk to.  Patient verbalizes understanding of instructions and care plan provided today and agrees to view in MyChart. Active MyChart status and patient understanding of how to access instructions and care plan via MyChart confirmed with patient.     Arlis Bent [facility] Health  [facility], [facility] Clinical Social Worker Direct Dial: [phone]  Fax: [phone] Website: [email or web address] 4:53 PM

## 2023-12-19 ENCOUNTER — Ambulatory Visit: Payer: Self-pay

## 2023-12-20 NOTE — Patient Outreach (Signed)
 Complex Care Management   Visit Note  12/19/2023  Name:  Gabrielle Hawkins MRN: [id number] DOB: 1966/07/17  Situation: Referral received for Complex Care Management related to SDOH Barriers:  Grief and Ulcerative Colitis and Rheumatoid Arthritis   I obtained verbal consent from Patient.  Visit completed with patient  on the phone  Background:   Past Medical History:  Diagnosis Date   Anemia 1998   Anxiety    Arthritis    big toe    Asthma    BRCA1 negative    BRCA2 negative    Breast cancer (HCC) 2006   chem/radiation/2years tamoxifen   Chronic neck and back pain    Crohn's disease (HCC)    Depression    Endometriosis    Fibroid    GERD (gastroesophageal reflux disease)    History of chemotherapy 01/2005   Hx of tamoxifen therapy    Hx of transfusion of packed red blood cells    Hypertension    IBS (irritable bowel syndrome)    Kidney stones    Lymphedema of arm    Migraines    just at dx of breast CA   Mucinous cystadenoma of ovary    left   Neuropathy    Paresthesias    Peripheral neuropathy    Personal history of chemotherapy    Personal history of radiation therapy    Radiation 04/2005   Ulcerative colitis (HCC)    with chronic diarrhea   Urinary tract infection    x 3   Vertigo 2014   Vitamin D deficiency     Assessment: Patient Reported Symptoms:  Cognitive Cognitive Status: Alert and oriented to person, place, and time Cognitive/Intellectual Conditions Management [RPT]: None reported or documented in medical history or problem list   Health Maintenance Behaviors: Annual physical exam, Healthy diet, Stress management Healing Pattern: Fast Health Facilitated by: Healthy diet, Prayer/meditation, Rest  Neurological  Neurological Symptoms Reported: No Symptoms reported     HEENT HEENT Symptoms Reported: Frequent sneezing, Nasal discharge, Runny nose HEENT Conditions:  (seasonal allergies) HEENT Management Strategies: Medication therapy, Routine  screening HEENT Self-Management Outcome: 4 (good)  (seasonal allergies)  Cardiovascular Cardiovascular Symptoms Reported: No symptoms reported Does patient have uncontrolled Hypertension?: No Weight: 194 lb (88 kg) (patient reported)  Respiratory Respiratory Symptoms Reported: Dry cough Respiratory Conditions: Seasonal allergies Respiratory Self-Management Outcome: 4 (good)  Endocrine Patient reports the following symptoms related to hypoglycemia or hyperglycemia : No symptoms reported Is patient diabetic?: No    Gastrointestinal Gastrointestinal Symptoms Reported: Abdominal pain or discomfort, Diarrhea, Cramping Gastrointestinal Conditions: Other Other Gastrointestinal Conditions: Ulcerative Colitis Gastrointestinal Management Strategies: Medication therapy, Diet modification, Coping strategies Gastrointestinal Self-Management Outcome: 3 (uncertain) Gastrointestinal Comment: Patient will ask her doctor is she can increase her frequency of Simponi infusions to every 6 weeks due to experiencing wearing off effects Nutrition Risk Screen (CP): No indicators present  Genitourinary  Genitourinary Symptoms Reported: No Symptoms reported    Integumentary  Integumentary Symptoms Reported: No Symptoms reported    Musculoskeletal Musculoskelatal Symptoms Reviewed: Muscle pain Musculoskeletal Conditions: Rheumatoid arthritis Musculoskeletal Management Strategies: Medication therapy, Routine screening Musculoskeletal Self-Management Outcome: 3 (uncertain) Musculoskeletal Comment: Patient plans to ask her doctor (Dr. Lendon Colonel, Rheumatologist) about increasing her biological infusion to every 6 weeks due to having wearing off effects Falls in the past year?: No Number of falls in past year: 1 or less Was there an injury with Fall?: No Fall Risk Category Calculator: 0 Patient Fall Risk Level: Low Fall Risk  Patient at Risk for Falls Due to: No Fall Risks  Psychosocial Psychosocial Symptoms Reported:  No symptoms reported Behavioral Management Strategies: Support system, Coping strategies Behavioral Health Self-Management Outcome: 4 (good) Major Change/Loss/Stressor/Fears (CP): Death of a loved one, Medical condition, family, Medical condition, self Techniques to Cope with Loss/Stress/Change: Diversional activities, Spiritual practice(s) Quality of Family Relationships: supportive Do you feel physically threatened by others?: No      12/19/2023    1:00 PM  Depression screen PHQ 2/9  Decreased Interest 1  Down, Depressed, Hopeless 1  PHQ - 2 Score 2  Altered sleeping 1  Tired, decreased energy 0  Change in appetite 1  Feeling bad or failure about yourself  0  Trouble concentrating 0  Moving slowly or fidgety/restless 0  Suicidal thoughts 0  PHQ-9 Score 4  Difficult doing work/chores Not difficult at all    There were no vitals filed for this visit.  Medications Reviewed Today     Reviewed by Riley Churches, RN (Registered Nurse) on 12/19/23 at 1258  Med List Status: <None>   Medication Order Taking? Sig Documenting Provider Last Dose Status Informant  albuterol (VENTOLIN HFA) 108 (90 Base) MCG/ACT inhaler [id number] Yes Inhale 2 puffs into the lungs every 6 (six) hours as needed for wheezing or shortness of breath. Arnette Felts, FNP Taking Active   amitriptyline (ELAVIL) 50 MG tablet [id number] Yes TAKE ONE TABLET BY MOUTH AT BEDTIME Arnette Felts, FNP Taking Active   atorvastatin (LIPITOR) 20 MG tablet [id number] Yes Take 1 tablet (20 mg total) by mouth daily. Arnette Felts, FNP Taking Active   BREO ELLIPTA 200-25 MCG/ACT AEPB [id number] Yes Inhale into the lungs. [provider] Taking Active   busPIRone (BUSPAR) 5 MG tablet [id number] Yes TAKE ONE TABLET BY MOUTH AT BEDTIME Arnette Felts, FNP Taking Active   diphenhydrAMINE (BENADRYL) 25 mg capsule [id number] Yes Take 25 mg by mouth every 6 (six) hours as needed for allergies. [provider] Taking Active    EPINEPHrine (EPIPEN 2-PAK) 0.3 mg/0.3 mL IJ SOAJ injection [id number] Yes Inject 0.3 mg into the muscle as needed for anaphylaxis. Arnette Felts, FNP Taking Active   eszopiclone Alfonso Patten) 2 MG TABS tablet [id number] Yes Take immediately before bedtime Arnette Felts, FNP Taking Active   folic acid (FOLVITE) 1 MG tablet [id number] Yes Take 3 mg by mouth daily. [provider] Taking Active Self  golimumab (SIMPONI ARIA) 50 MG/4ML SOLN injection [id number] Yes 50 mg See admin instructions. Infusion every 8 weeks, [facility] [provider] Taking Active Self  hydroxychloroquine (PLAQUENIL) 200 MG tablet [id number] Yes Take 200 mg by mouth 2 (two) times daily. [provider] Taking Active    Patient not taking:   Discontinued 11/02/20 0709          Med Note (Faiga Stones L   Wed Feb 26, 2020 10:00 AM)    levocetirizine (XYZAL) 5 MG tablet [id number] Yes TAKE ONE TABLET BY MOUTH EVERY EVENING Alfonse Spruce, MD Taking Active   methotrexate (50 MG/ML) 1 g injection [id number] Yes Inject into the vein every Monday. 0.55ml every Monday [provider] Taking Active Self  Multiple Vitamins-Minerals (ONE A DAY WOMEN 50 PLUS) CHEW [id number] No Chew 1 tablet by mouth daily.  Patient not taking: Reported on 12/19/2023   [provider] Not Taking Active            Med Note (Teale Goodgame L   Tue Dec 19, 2023 12:49 PM) Patient plans to  replace this medication and resume taking it   olmesartan (BENICAR) 20 MG tablet [id number] Yes TAKE ONE TABLET BY MOUTH ONCE DAILY Moore, Janece, FNP Taking Active   omeprazole (PRILOSEC) 40 MG capsule [id number] Yes Take 1 capsule (40 mg total) by mouth daily. Prescott Brodie, MD Taking Active Self  OVER THE COUNTER MEDICATION [id number]  Aleve cream for pain [provider]  Consider Medication Status and Discontinue (Completed Course) Self  phentermine 15 MG capsule [id number]  Take 1 capsule (15 mg total)  by mouth every morning. Susanna Epley, FNP  Consider Medication Status and Discontinue (Discontinued by provider)    Patient not taking:   Discontinued 11/02/20 0709   tiZANidine (ZANAFLEX) 2 MG tablet [id number] Yes Take 2 mg by mouth 3 (three) times daily. [provider] Taking Active Self           Med Note (Jerrad Mendibles L   Tue Dec 19, 2023 12:51 PM) Patient is taking as needed   Vitamin D, Ergocalciferol, (DRISDOL) 1.25 MG (50000 UNIT) CAPS capsule [id number] Yes Take 1 capsule (50,000 Units total) by mouth every 7 (seven) days. Susanna Epley, FNP Taking Active             Recommendation:   Specialty provider follow-up with Dr. Marylin So as directed for Simponi infusion   Follow Up Plan:   Telephone follow up appointment date/time:  02/02/24 @10 :30 AM  Louanne Roussel RN BSN CCM [place]  [facility], [facility] Health Nurse Care Coordinator  Direct Dial: [phone] Website: [email or web address]

## 2023-12-20 NOTE — Patient Instructions (Signed)
 Visit Information  Thank you for taking time to visit with me today. Please don't hesitate to contact me if I can be of assistance to you before our next scheduled appointment.  Our next appointment is by telephone on 02/02/24 at 10:30 AM Please call the care guide team at [phone] if you need to cancel or reschedule your appointment.   Following is a copy of your care plan:   Goals Addressed             This Visit's Progress    COMPLETED: To keep ulcerative colitis under good control       Care Coordination Interventions: See new goal      COMPLETED: To reduce stress and anxiety related to caregiver assistance       Care Coordination Interventions: Evaluation of current treatment plan related to caregiver stress and anxiety  and patient's adherence to plan as established by provider Determined patient's aunt passed away peacefully surrounded by her family  Motivational Interviewing employed Active listening / Reflection utilized  Industrial/product designer Provided     VBCI RN Care Plan       Problems:  Chronic Disease Management support and education needs related to Ulcerative Colitis and Rheumatoid Arthritis  Corporate treasurer.  Goal: Over the next 60 days the Patient will demonstrate Improved health management independence as evidenced by patient will experienced less frequency of GI upset (cramping and diarrhea) and decreased pain related to her Rheumatoid Arthritis,          Interventions:   Pain Interventions: Pain assessment performed Medications reviewed Reviewed provider established plan for pain management Discussed importance of adherence to all scheduled medical appointments Counseled on the importance of reporting any/all new or changed pain symptoms or management strategies to pain management provider Advised patient to report to care team affect of pain on daily activities Reviewed with patient prescribed pharmacological and nonpharmacological pain relief  strategies Advised patient to discuss wearing off effects from Simponi with provider  Patient Self-Care Activities:  Attend all scheduled provider appointments Call pharmacy for medication refills 3-7 days in advance of running out of medications Call provider office for new concerns or questions  Take medications as prescribed   Work with the social worker to address care coordination needs and will continue to work with the clinical team to address health care and disease management related needs  Plan:  Follow up with provider re: Simponi Infusions and discuss wearing off effects with current dosage/frequency Telephone follow up appointment with care management team member scheduled for:  02/02/24 @10 :30 AM     COMPLETED: Work on losing weight       Care Coordination Interventions: Evaluation of current treatment plan related to weight loss  and patient's adherence to plan as established by provider Determined patient was unable to tolerate the medication Phentermine, her doctor is aware Counseled patient about recommendations to use meal planning and the plate method, portion control and routine exercise, aiming for 150 minutes weekly as tolerated to aid in weight loss goals Educated patient about the benefits she may gain from working with a Nutritionist and she will consider  Instructed patient to keep her doctor informed of her weight loss concerns and or progress with weight loss Body mass index is 31.17 kg/m.     Wt Readings from Last 3 Encounters:  09/20/23 199 lb (90.3 kg)  09/20/23 199 lb 3.2 oz (90.4 kg)  07/17/23 199 lb 12.8 oz (90.6 kg)  Please call 1-800-273-TALK (toll free, 24 hour hotline) if you are experiencing a Mental Health or Behavioral Health Crisis or need someone to talk to.  Patient verbalizes understanding of instructions and care plan provided today and agrees to view in MyChart. Active MyChart status and patient understanding of how to access  instructions and care plan via MyChart confirmed with patient.     Louanne Roussel RN BSN CCM [place]  [facility], [facility] Health Nurse Care Coordinator  Direct Dial: [phone] Website: [email or web address]

## 2023-12-25 ENCOUNTER — Other Ambulatory Visit: Payer: Self-pay | Admitting: Nurse Practitioner

## 2024-01-02 ENCOUNTER — Encounter: Payer: Self-pay | Admitting: Nurse Practitioner

## 2024-01-02 ENCOUNTER — Ambulatory Visit (INDEPENDENT_AMBULATORY_CARE_PROVIDER_SITE_OTHER): Admitting: Nurse Practitioner

## 2024-01-02 VITALS — BP 120/70 | HR 83 | Temp 98.7°F | Ht 67.0 in | Wt 198.4 lb

## 2024-01-02 DIAGNOSIS — E6609 Other obesity due to excess calories: Secondary | ICD-10-CM

## 2024-01-02 DIAGNOSIS — Z6831 Body mass index (BMI) 31.0-31.9, adult: Secondary | ICD-10-CM

## 2024-01-02 DIAGNOSIS — M7712 Lateral epicondylitis, left elbow: Secondary | ICD-10-CM | POA: Insufficient documentation

## 2024-01-02 DIAGNOSIS — Z2821 Immunization not carried out because of patient refusal: Secondary | ICD-10-CM | POA: Insufficient documentation

## 2024-01-02 DIAGNOSIS — M25552 Pain in left hip: Secondary | ICD-10-CM | POA: Diagnosis not present

## 2024-01-02 DIAGNOSIS — E66811 Obesity, class 1: Secondary | ICD-10-CM | POA: Diagnosis not present

## 2024-01-02 DIAGNOSIS — M459 Ankylosing spondylitis of unspecified sites in spine: Secondary | ICD-10-CM | POA: Diagnosis not present

## 2024-01-02 DIAGNOSIS — K5190 Ulcerative colitis, unspecified, without complications: Secondary | ICD-10-CM | POA: Diagnosis not present

## 2024-01-02 MED ORDER — AMITRIPTYLINE HCL 50 MG PO TABS: 50.0000 mg | ORAL_TABLET | Freq: Every day | ORAL | 1 refills | Status: DC

## 2024-01-02 MED ORDER — TRIAMCINOLONE ACETONIDE 40 MG/ML IJ SUSP
60.0000 mg | Freq: Once | INTRAMUSCULAR | Status: AC
  Administered 2024-01-02: 60 mg via INTRAMUSCULAR

## 2024-01-02 NOTE — Assessment & Plan Note (Signed)
 Declines shingrix, educated on disease process and is aware if he changes his mind to notify office

## 2024-01-02 NOTE — Assessment & Plan Note (Signed)

## 2024-01-02 NOTE — Assessment & Plan Note (Signed)
 She is encouraged to strive for BMI less than 30 to decrease cardiac risk. Advised to aim for at least 150 minutes of exercise per week.

## 2024-01-02 NOTE — Assessment & Plan Note (Signed)
 Tenderness on palpation and with range of motion. Will treat as well with Kenalog.

## 2024-01-02 NOTE — Assessment & Plan Note (Signed)
 Tenderness to left lateral elbow with mild swelling. Will treat with kenalog injection. Advised if not better return to Orthopedics.

## 2024-01-02 NOTE — Progress Notes (Signed)
 [place], CMA,acting as a Neurosurgeon for Susanna Epley, FNP.,have documented all relevant documentation on the behalf of Susanna Epley, FNP,as directed by  Susanna Epley, FNP while in the presence of Susanna Epley, FNP.  Subjective:  Patient ID: Gabrielle Hawkins , female    DOB: 1966-04-05 , 58 y.o.   MRN: [id number]  Chief Complaint  Patient presents with   Arm Pain    Patient reports pain in left arm, she reports it feels swollen. Patient reports it is on and off since January but is more constant now. Patient states her pain is mostly by her elbow.    Hip Pain    Patient reports having left hip pain that travels to her inner thigh area. She reports it first started about a month ago. She reports she is sleeping with a pillow between her legs for little relief.     HPI  She has seen an orthopedic Dr. Yvonne Hering for her right elbow pain. She is now having pain to her left elbow, she has been taking aleve . She is to not take a lot of aleve  but has, that has been the only thing to take the edge off. She feels like she may have overextended her left arm.   She had sciatica pain to her left side years ago and is now having pain. Difficult for her to walk.      Past Medical History:  Diagnosis Date   Anemia 1998   Anxiety    Arthritis    big toe    Asthma    BRCA1 negative    BRCA2 negative    Breast cancer (HCC) 2006   chem/radiation/2years tamoxifen   Chronic neck and back pain    Crohn's disease (HCC)    Depression    Endometriosis    Fibroid    GERD (gastroesophageal reflux disease)    History of chemotherapy 01/2005   Hx of tamoxifen therapy    Hx of transfusion of packed red blood cells    Hypertension    IBS (irritable bowel syndrome)    Kidney stones    Lymphedema of arm    Migraines    just at dx of breast CA   Mucinous cystadenoma of ovary    left   Neuropathy    Paresthesias    Peripheral neuropathy    Personal history of chemotherapy    Personal history of  radiation therapy    Radiation 04/2005   Ulcerative colitis (HCC)    with chronic diarrhea   Urinary tract infection    x 3   Vertigo 2014   Vitamin D  deficiency      Family History  Problem Relation Age of Onset   Breast cancer Mother 64       2nd primary dx 61; neg GT   Heart failure Father    Heart disease Father    Leukemia Father 18       ? chronic; survelliance only   Colon cancer Maternal Grandmother        d. 31s   Heart failure Paternal Grandmother    Heart disease Paternal Grandmother    Cervical cancer Paternal Grandmother        dx 90s   Prostate cancer Paternal Grandfather        dx >60; mets to bone   Cancer Other        MGF's nephews w/ prostate and pancreatic cancer; MGF"s niece w/ lung cancer; MGF's sister w/ unk cancer  Breast cancer Maternal Great-grandmother        d. >46; MGF's mother     Current Outpatient Medications:    albuterol  (VENTOLIN  HFA) 108 (90 Base) MCG/ACT inhaler, Inhale 2 puffs into the lungs every 6 (six) hours as needed for wheezing or shortness of breath., Disp: 8.5 g, Rfl: 2   atorvastatin  (LIPITOR) 20 MG tablet, TAKE ONE TABLET BY MOUTH ONCE DAILY, Disp: 30 tablet, Rfl: 7   BREO ELLIPTA  200-25 MCG/ACT AEPB, Inhale into the lungs., Disp: , Rfl:    busPIRone  (BUSPAR ) 5 MG tablet, TAKE ONE TABLET BY MOUTH AT BEDTIME, Disp: 90 tablet, Rfl: 1   diphenhydrAMINE  (BENADRYL ) 25 mg capsule, Take 25 mg by mouth every 6 (six) hours as needed for allergies., Disp: , Rfl:    EPINEPHrine  (EPIPEN  2-PAK) 0.3 mg/0.3 mL IJ SOAJ injection, Inject 0.3 mg into the muscle as needed for anaphylaxis., Disp: 1 each, Rfl: 2   eszopiclone  (LUNESTA ) 2 MG TABS tablet, Take immediately before bedtime, Disp: 30 tablet, Rfl: 5   folic acid (FOLVITE) 1 MG tablet, Take 3 mg by mouth daily., Disp: , Rfl:    golimumab  (SIMPONI  ARIA) 50 MG/4ML SOLN injection, 50 mg See admin instructions. Infusion every 8 weeks, [facility] [facility], Disp: , Rfl:     hydroxychloroquine (PLAQUENIL) 200 MG tablet, Take 200 mg by mouth 2 (two) times daily., Disp: , Rfl:    levocetirizine (XYZAL ) 5 MG tablet, TAKE ONE TABLET BY MOUTH EVERY EVENING, Disp: 30 tablet, Rfl: 0   methotrexate (50 MG/ML) 1 g injection, Inject into the vein every Monday. 0.71ml every Monday, Disp: , Rfl:    olmesartan  (BENICAR ) 20 MG tablet, TAKE ONE TABLET BY MOUTH ONCE DAILY, Disp: 90 tablet, Rfl: 1   omeprazole  (PRILOSEC) 40 MG capsule, Take 1 capsule (40 mg total) by mouth daily., Disp: 30 capsule, Rfl: 3   OVER THE COUNTER MEDICATION, Aleve  cream for pain, Disp: , Rfl:    tiZANidine (ZANAFLEX) 2 MG tablet, Take 2 mg by mouth 3 (three) times daily., Disp: , Rfl:    Vitamin D , Ergocalciferol , (DRISDOL ) 1.25 MG (50000 UNIT) CAPS capsule, Take 1 capsule (50,000 Units total) by mouth every 7 (seven) days., Disp: 24 capsule, Rfl: 2   amitriptyline  (ELAVIL ) 50 MG tablet, Take 1 tablet (50 mg total) by mouth at bedtime., Disp: 90 tablet, Rfl: 1   Allergies  Allergen Reactions   Humira [Adalimumab] Anaphylaxis   Ampicillin Hives and Itching   Codeine    Compazine [Prochlorperazine Maleate] Other (See Comments)    "Stroke-like symptoms" Can tolerate Phenergan .   Cymbalta [Duloxetine Hcl] Other (See Comments)    Fainting   Imitrex [Sumatriptan Base] Hives and Nausea And Vomiting   Lyrica [Pregabalin] Other (See Comments)    Fainting   Metoclopramide Hcl Hives   Percocet [Oxycodone -Acetaminophen ] Itching   Effexor [Venlafaxine Hydrochloride] Hives and Palpitations   Fentanyl  Nausea And Vomiting   Tape Itching and Rash     Review of Systems  Constitutional: Negative.   HENT: Negative.    Eyes: Negative.   Respiratory: Negative.    Cardiovascular: Negative.   Gastrointestinal: Negative.   Neurological: Negative.   Psychiatric/Behavioral: Negative.       Today's Vitals   01/02/24 1218  BP: 120/70  Pulse: 83  Temp: 98.7 F (37.1 C)  TempSrc: Oral  Weight: 198 lb 6.4 oz  (90 kg)  Height: 5\' 7"  (1.702 m)  PainSc: 8   PainLoc: Hip   Body mass index is 31.07  kg/m.  Wt Readings from Last 3 Encounters:  01/02/24 198 lb 6.4 oz (90 kg)  12/19/23 194 lb (88 kg)  09/20/23 199 lb (90.3 kg)     Objective:  Physical Exam Vitals and nursing note reviewed.  Constitutional:      General: She is not in acute distress.    Appearance: Normal appearance. She is obese.  Cardiovascular:     Rate and Rhythm: Normal rate and regular rhythm.     Pulses: Normal pulses.     Heart sounds: Normal heart sounds. No murmur heard. Pulmonary:     Effort: Pulmonary effort is normal. No respiratory distress.     Breath sounds: Normal breath sounds. No wheezing.  Musculoskeletal:        General: Swelling (mild swelling lateral elbow left side) and tenderness (left lateral hip and left lateral elbow) present.     Right hip: Normal range of motion.     Left hip: Decreased range of motion (limited range of motion).  Skin:    General: Skin is warm and dry.     Capillary Refill: Capillary refill takes less than 2 seconds.  Neurological:     General: No focal deficit present.     Mental Status: She is alert and oriented to person, place, and time.     Cranial Nerves: No cranial nerve deficit.     Motor: No weakness.  Psychiatric:        Mood and Affect: Mood normal.        Behavior: Behavior normal.        Thought Content: Thought content normal.        Judgment: Judgment normal.         Assessment And Plan:  Lateral epicondylitis of left elbow Assessment & Plan: Tenderness to left lateral elbow with mild swelling. Will treat with kenalog injection. Advised if not better return to Orthopedics.  Orders: -     Triamcinolone Acetonide  Left hip pain Assessment & Plan: Tenderness on palpation and with range of motion. Will treat as well with Kenalog.   Orders: -     Triamcinolone Acetonide  Herpes zoster vaccination declined Assessment & Plan: Declines shingrix ,  educated on disease process and is aware if he changes his mind to notify office    COVID-19 vaccination declined Assessment & Plan: Declines covid 19 vaccine. Discussed risk of covid 85 and if she changes her mind about the vaccine to call the office. Education has been provided regarding the importance of this vaccine but patient still declined. Advised may receive this vaccine at local pharmacy or Health Dept.or vaccine clinic. Aware to provide a copy of the vaccination record if obtained from local pharmacy or Health Dept.  Encouraged to take multivitamin, vitamin d , vitamin c and zinc to increase immune system. Aware can call office if would like to have vaccine here at office. Verbalized acceptance and understanding.    Class 1 obesity due to excess calories without serious comorbidity with body mass index (BMI) of 31.0 to 31.9 in adult Assessment & Plan: She is encouraged to strive for BMI less than 30 to decrease cardiac risk. Advised to aim for at least 150 minutes of exercise per week.     Other orders -     Amitriptyline  HCl; Take 1 tablet (50 mg total) by mouth at bedtime.  Dispense: 90 tablet; Refill: 1    Return in about 4 months (around 05/03/2024) for bp check.  Patient was given opportunity to  ask questions. Patient verbalized understanding of the plan and was able to repeat key elements of the plan. All questions were answered to their satisfaction.    Inge Mangle, FNP, have reviewed all documentation for this visit. The documentation on 01/02/24 for the exam, diagnosis, procedures, and orders are all accurate and complete.   IF YOU HAVE BEEN REFERRED TO A SPECIALIST, IT MAY TAKE 1-2 WEEKS TO SCHEDULE/PROCESS THE REFERRAL. IF YOU HAVE NOT HEARD FROM US /SPECIALIST IN TWO WEEKS, PLEASE GIVE US  A CALL AT [phone].

## 2024-01-15 ENCOUNTER — Other Ambulatory Visit: Payer: Self-pay | Admitting: Licensed Clinical Social Worker

## 2024-01-15 DIAGNOSIS — I10 Essential (primary) hypertension: Secondary | ICD-10-CM

## 2024-01-15 NOTE — Patient Outreach (Signed)
 Complex Care Management   Visit Note  01/15/2024  Name:  Gabrielle Hawkins MRN: [id number] DOB: [birth year]  Situation: Referral received for Complex Care Management related to Stress I obtained verbal consent from Patient.  Visit completed with pt  on the phone  Background:   Past Medical History:  Diagnosis Date   Anemia 1998   Anxiety    Arthritis    big toe    Asthma    BRCA1 negative    BRCA2 negative    Breast cancer (HCC) 2006   chem/radiation/2years tamoxifen   Chronic neck and back pain    Crohn's disease (HCC)    Depression    Endometriosis    Fibroid    GERD (gastroesophageal reflux disease)    History of chemotherapy 01/2005   Hx of tamoxifen therapy    Hx of transfusion of packed red blood cells    Hypertension    IBS (irritable bowel syndrome)    Kidney stones    Lymphedema of arm    Migraines    just at dx of breast CA   Mucinous cystadenoma of ovary    left   Neuropathy    Paresthesias    Peripheral neuropathy    Personal history of chemotherapy    Personal history of radiation therapy    Radiation 04/2005   Ulcerative colitis (HCC)    with chronic diarrhea   Urinary tract infection    x 3   Vertigo 2014   Vitamin D  deficiency     Assessment: Patient Reported Symptoms:  Cognitive Cognitive Status: Normal speech and language skills, Alert and oriented to person, place, and time      Neurological Neurological Review of Symptoms: No symptoms reported    HEENT HEENT Symptoms Reported: No symptoms reported      Cardiovascular Cardiovascular Symptoms Reported: No symptoms reported    Respiratory Respiratory Symptoms Reported: No symptoms reported    Endocrine Patient reports the following symptoms related to hypoglycemia or hyperglycemia : No symptoms reported    Gastrointestinal Gastrointestinal Symptoms Reported: No symptoms reported      Genitourinary Genitourinary Symptoms Reported: No symptoms reported    Integumentary  Integumentary Symptoms Reported: No symptoms reported    Musculoskeletal Musculoskelatal Symptoms Reviewed: Weakness   Falls in the past year?: Yes Number of falls in past year: 1 or less Was there an injury with Fall?: No Fall Risk Category Calculator: 1 Patient Fall Risk Level: Low Fall Risk Fall risk Follow up: Falls prevention discussed  Psychosocial Psychosocial Symptoms Reported: No symptoms reported Additional Psychological Details: Patient continues to grieve loved one's recent passing. Strategies discussed to assist with symptom managment Behavioral Management Strategies: Coping strategies, Support system Major Change/Loss/Stressor/Fears (CP): Death of a loved one, Medical condition, self Quality of Family Relationships: helpful, involved, supportive Do you feel physically threatened by others?: No      12/19/2023    1:00 PM  Depression screen PHQ 2/9  Decreased Interest 1  Down, Depressed, Hopeless 1  PHQ - 2 Score 2  Altered sleeping 1  Tired, decreased energy 0  Change in appetite 1  Feeling bad or failure about yourself  0  Trouble concentrating 0  Moving slowly or fidgety/restless 0  Suicidal thoughts 0  PHQ-9 Score 4  Difficult doing work/chores Not difficult at all    There were no vitals filed for this visit.  Medications Reviewed Today   Medications were not reviewed in this encounter     Recommendation:  Continue utilizing strategies discussed to assist with symptom management  Follow Up Plan:   Telephone follow-up 2-4 weeks  Alease Hunter, LCSW [place]  [facility], [facility] Clinical Social Worker Direct Dial: [phone]  Fax: [phone] Website: [email or web address] 10:58 AM

## 2024-01-15 NOTE — Patient Instructions (Signed)
 Visit Information  Thank you for taking time to visit with me today. Please don't hesitate to contact me if I can be of assistance to you before our next scheduled appointment.  Your next care management appointment is by telephone on 6/5 at 9 PM  Please call the care guide team at [phone] if you need to cancel, schedule, or reschedule an appointment.   Please call the Suicide and Crisis Lifeline: 988 call 911 if you are experiencing a Mental Health or Behavioral Health Crisis or need someone to talk to.  Alease Hunter, LCSW [place]  [facility], [facility] Clinical Social Worker Direct Dial: [phone]  Fax: [phone] Website: [email or web address] 10:58 AM

## 2024-01-24 ENCOUNTER — Telehealth: Payer: Self-pay

## 2024-01-24 NOTE — Progress Notes (Signed)
 Complex Care Management Note Care Guide Note  01/24/2024 Name: Gabrielle Hawkins MRN: [id number] DOB: [birth year]   Complex Care Management Outreach Attempts: An unsuccessful telephone outreach was attempted today to offer the patient information about available complex care management services.  Follow Up Plan:  Additional outreach attempts will be made to offer the patient complex care management information and services.   Encounter Outcome:  No Answer  Atalie Oros [facility]  [facility] Guide Direct Dial: [phone]  Fax: [phone] Website: [email or web address]

## 2024-01-25 ENCOUNTER — Telehealth: Payer: Self-pay

## 2024-01-25 NOTE — Progress Notes (Signed)
 Complex Care Management Note Care Guide Note  01/25/2024 Name: Gabrielle Hawkins MRN: [id number] DOB: 25-Aug-1966  [name] is a 58 y.o. year old female who is a primary care patient of Susanna Epley, FNP . The community resource team was consulted for assistance with weatherization program.  SDOH screenings and interventions completed:  Yes  Social Drivers of Health From This Encounter   Food Insecurity: No Food Insecurity (01/25/2024)   Hunger Vital Sign    Worried About Running Out of Food in the Last Year: Never true    Ran Out of Food in the Last Year: Never true  Housing: Low Risk  (01/25/2024)   Housing Stability Vital Sign    Unable to Pay for Housing in the Last Year: No    Number of Times Moved in the Last Year: 0    Homeless in the Last Year: No  Financial Resource Strain: Low Risk  (01/25/2024)   Overall Financial Resource Strain (CARDIA)    Difficulty of Paying Living Expenses: Not hard at all  Transportation Needs: No Transportation Needs (01/25/2024)   PRAPARE - Administrator, Civil Service (Medical): No    Lack of Transportation (Non-Medical): No  Utilities: Not At Risk (01/25/2024)   Utilities    Threatened with loss of utilities: No    SDOH Interventions Today    Flowsheet Row Most Recent Value  SDOH Interventions   Housing Interventions Other (Comment)  [Spoke with patient about Building control surveyor. Patient gave consent to fill out the website quick form to have a community development specialists contact her. Verified address to send application.]        Care guide performed the following interventions: Verified mailing address to send [facility] application. Weatherization Program for [facility] is not on the [employer] platform for referral.  Follow Up Plan:  No further follow up planned at this time. The patient has been provided with needed resources. and patient has my phone number  if she does not receive application.  Encounter Outcome:  Patient Visit Completed  Sonia Bromell [facility]  [facility] Guide Direct Dial: [phone]  Fax: [phone] Website: [email or web address]

## 2024-01-31 DIAGNOSIS — K5100 Ulcerative (chronic) pancolitis without complications: Secondary | ICD-10-CM | POA: Diagnosis not present

## 2024-01-31 DIAGNOSIS — G894 Chronic pain syndrome: Secondary | ICD-10-CM | POA: Diagnosis not present

## 2024-01-31 DIAGNOSIS — M459 Ankylosing spondylitis of unspecified sites in spine: Secondary | ICD-10-CM | POA: Diagnosis not present

## 2024-01-31 DIAGNOSIS — M5459 Other low back pain: Secondary | ICD-10-CM | POA: Diagnosis not present

## 2024-01-31 DIAGNOSIS — Z79899 Other long term (current) drug therapy: Secondary | ICD-10-CM | POA: Diagnosis not present

## 2024-02-02 ENCOUNTER — Other Ambulatory Visit: Payer: Self-pay

## 2024-02-02 NOTE — Patient Instructions (Signed)
 Visit Information  Thank you for taking time to visit with me today. Please don't hesitate to contact me if I can be of assistance to you before our next scheduled appointment.  Your next care management appointment is by telephone on Thursday, July 3 at 12:00 PM  Please call the care guide team at [phone] if you need to cancel, schedule, or reschedule an appointment.   Please call 1-800-273-TALK (toll free, 24 hour hotline) if you are experiencing a Mental Health or Behavioral Health Crisis or need someone to talk to.  Louanne Roussel RN BSN CCM [place]  [facility], [facility] Nurse Care Coordinator  Direct Dial: [phone] Website: [email or web address]

## 2024-02-02 NOTE — Patient Outreach (Signed)
 Complex Care Management   Visit Note  02/02/2024  Name:  Gabrielle Hawkins MRN: [id number] DOB: 12-31-1965  Situation: Referral received for Complex Care Management related to left hip pain, Ulcerative Colitis; RA, grief. I obtained verbal consent from Patient.  Visit completed with patient on the phone.  Background:   Past Medical History:  Diagnosis Date   Anemia 1998   Anxiety    Arthritis    big toe    Asthma    BRCA1 negative    BRCA2 negative    Breast cancer (HCC) 2006   chem/radiation/2years tamoxifen   Chronic neck and back pain    Crohn's disease (HCC)    Depression    Endometriosis    Fibroid    GERD (gastroesophageal reflux disease)    History of chemotherapy 01/2005   Hx of tamoxifen therapy    Hx of transfusion of packed red blood cells    Hypertension    IBS (irritable bowel syndrome)    Kidney stones    Lymphedema of arm    Migraines    just at dx of breast CA   Mucinous cystadenoma of ovary    left   Neuropathy    Paresthesias    Peripheral neuropathy    Personal history of chemotherapy    Personal history of radiation therapy    Radiation 04/2005   Ulcerative colitis (HCC)    with chronic diarrhea   Urinary tract infection    x 3   Vertigo 2014   Vitamin D  deficiency     Assessment: Patient Reported Symptoms:  Cognitive Cognitive Status: Alert and oriented to person, place, and time Cognitive/Intellectual Conditions Management [RPT]: None reported or documented in medical history or problem list   Health Maintenance Behaviors: Annual physical exam, Healthy diet, Stress management Health Facilitated by: Healthy diet, Prayer/meditation, Rest, Stress management  Neurological Neurological Review of Symptoms: No symptoms reported    HEENT HEENT Symptoms Reported: No symptoms reported      Cardiovascular Cardiovascular Symptoms Reported: No symptoms reported Does patient have uncontrolled Hypertension?: No Cardiovascular Conditions:  Hypertension Cardiovascular Management Strategies: Medication therapy, Routine screening Cardiovascular Self-Management Outcome: 4 (good)  Respiratory Respiratory Symptoms Reported: No symptoms reported    Endocrine Patient reports the following symptoms related to hypoglycemia or hyperglycemia : No symptoms reported Is patient diabetic?: No Endocrine Conditions: Vitamin D  deficiency Endocrine Management Strategies: Medication therapy, Routine screening, Diet modification Endocrine Self-Management Outcome: 4 (good)  Gastrointestinal Gastrointestinal Symptoms Reported: Abdominal pain or discomfort, Cramping, Constipation, Diarrhea, Obesity Additional Gastrointestinal Details: Ulcerative Colitis Gastrointestinal Management Strategies: Medication therapy Gastrointestinal Self-Management Outcome: 4 (good) Nutrition Risk Screen (CP): No indicators present  Genitourinary Genitourinary Symptoms Reported: No symptoms reported    Integumentary Integumentary Symptoms Reported: No symptoms reported    Musculoskeletal Musculoskelatal Symptoms Reviewed: Muscle pain Musculoskeletal Conditions: Rheumatoid arthritis, Joint pain Musculoskeletal Management Strategies: Medication therapy, Routine screening Musculoskeletal Self-Management Outcome: 4 (good)   Patient at Risk for Falls Due to: History of fall(s) Fall risk Follow up: Education provided, Falls evaluation completed  Psychosocial Psychosocial Symptoms Reported: Depression - if selected complete PHQ 2-9 Behavioral Health Conditions: Other Other Behavorial Health Conditions: grief Behavioral Management Strategies: Counseling, Medication therapy, Support system Behavioral Health Self-Management Outcome: 3 (uncertain) Major Change/Loss/Stressor/Fears (CP): Death of a loved one, Medical condition, self Techniques to Cope with Loss/Stress/Change: Counseling, Medication, Meditation, Diversional activities Quality of Family Relationships: helpful,  involved, supportive      12/19/2023    1:00 PM  Depression screen  PHQ 2/9  Decreased Interest 1  Down, Depressed, Hopeless 1  PHQ - 2 Score 2  Altered sleeping 1  Tired, decreased energy 0  Change in appetite 1  Feeling bad or failure about yourself  0  Trouble concentrating 0  Moving slowly or fidgety/restless 0  Suicidal thoughts 0  PHQ-9 Score 4  Difficult doing work/chores Not difficult at all    There were no vitals filed for this visit.  Medications Reviewed Today     Reviewed by Kaylene Pascal, RN (Registered Nurse) on 02/02/24 at 1102  Med List Status: <None>   Medication Order Taking? Sig Documenting Provider Last Dose Status Informant  albuterol  (VENTOLIN  HFA) 108 (90 Base) MCG/ACT inhaler [id number]  Inhale 2 puffs into the lungs every 6 (six) hours as needed for wheezing or shortness of breath. Susanna Epley, FNP  Active   amitriptyline  (ELAVIL ) 50 MG tablet [id number]  Take 1 tablet (50 mg total) by mouth at bedtime. Susanna Epley, FNP  Active   atorvastatin  (LIPITOR) 20 MG tablet [id number]  TAKE ONE TABLET BY MOUTH ONCE DAILY Susanna Epley, FNP  Active   BREO ELLIPTA  200-25 MCG/ACT AEPB [id number]  Inhale into the lungs. [provider]  Active   busPIRone  (BUSPAR ) 5 MG tablet [id number]  TAKE ONE TABLET BY MOUTH AT BEDTIME Susanna Epley, FNP  Active   diphenhydrAMINE  (BENADRYL ) 25 mg capsule [id number]  Take 25 mg by mouth every 6 (six) hours as needed for allergies. [provider]  Active   EPINEPHrine  (EPIPEN  2-PAK) 0.3 mg/0.3 mL IJ SOAJ injection [id number]  Inject 0.3 mg into the muscle as needed for anaphylaxis. Susanna Epley, FNP  Active   eszopiclone  (LUNESTA ) 2 MG TABS tablet [id number]  Take immediately before bedtime Susanna Epley, FNP  Active   folic acid (FOLVITE) 1 MG tablet [id number]  Take 3 mg by mouth daily. [provider]  Active Self  golimumab  (SIMPONI  ARIA) 50 MG/4ML SOLN injection [id number]  50 mg See admin instructions.  Infusion every 8 weeks, [place] [facility] [provider]  Active Self  hydroxychloroquine (PLAQUENIL) 200 MG tablet [id number]  Take 200 mg by mouth 2 (two) times daily. [provider]  Active    Patient not taking:   Discontinued 11/02/20 0709          Med Note (Mathilda Maguire L   Wed Feb 26, 2020 10:00 AM)    levocetirizine (XYZAL ) 5 MG tablet [id number]  TAKE ONE TABLET BY MOUTH EVERY EVENING Rochester Chuck, MD  Active   methotrexate (50 MG/ML) 1 g injection [id number] Yes Inject 1 g into the vein every Monday. [provider] Taking Active Self  olmesartan  (BENICAR ) 20 MG tablet [id number]  TAKE ONE TABLET BY MOUTH ONCE DAILY Susanna Epley, FNP  Active   omeprazole  (PRILOSEC) 40 MG capsule [id number]  Take 1 capsule (40 mg total) by mouth daily. Prescott Brodie, MD  Active Self  OVER THE COUNTER MEDICATION [id number]  Aleve  cream for pain [provider]  Active Self  tiZANidine (ZANAFLEX) 2 MG tablet [id number]  Take 2 mg by mouth 3 (three) times daily. [provider]  Active Self           Med Note (Riane Rung L   Tue Dec 19, 2023 12:51 PM) Patient is taking as needed   Vitamin D , Ergocalciferol , (DRISDOL ) 1.25 MG (50000 UNIT) CAPS capsule [id number]  Take 1 capsule (50,000 Units total) by mouth every 7 (seven) days. Susanna Epley, FNP  Active             Recommendation:   Specialty provider follow-up with Orthopedics for evaluation of left hip pain  Follow Up Plan:   Telephone follow up appointment date/time:  Thursday, July 3 at 12:00 PM  Louanne Roussel RN BSN CCM [place]  [facility], [facility] Nurse Care Coordinator  Direct Dial: [phone] Website: [email or web address]

## 2024-02-09 ENCOUNTER — Other Ambulatory Visit: Payer: Self-pay

## 2024-02-09 ENCOUNTER — Emergency Department (HOSPITAL_COMMUNITY)
Admission: EM | Admit: 2024-02-09 | Discharge: 2024-02-09 | Disposition: A | Discharge status: Home / Self Care | Attending: Emergency Medicine | Admitting: Emergency Medicine

## 2024-02-09 ENCOUNTER — Other Ambulatory Visit: Payer: Self-pay | Admitting: Licensed Clinical Social Worker

## 2024-02-09 ENCOUNTER — Encounter (HOSPITAL_COMMUNITY): Payer: Self-pay

## 2024-02-09 ENCOUNTER — Emergency Department (HOSPITAL_COMMUNITY)

## 2024-02-09 DIAGNOSIS — M7989 Other specified soft tissue disorders: Secondary | ICD-10-CM | POA: Diagnosis not present

## 2024-02-09 DIAGNOSIS — S86912A Strain of unspecified muscle(s) and tendon(s) at lower leg level, left leg, initial encounter: Secondary | ICD-10-CM | POA: Diagnosis not present

## 2024-02-09 DIAGNOSIS — W06XXXA Fall from bed, initial encounter: Secondary | ICD-10-CM | POA: Insufficient documentation

## 2024-02-09 DIAGNOSIS — S838X2A Sprain of other specified parts of left knee, initial encounter: Secondary | ICD-10-CM | POA: Diagnosis not present

## 2024-02-09 DIAGNOSIS — S8992XA Unspecified injury of left lower leg, initial encounter: Secondary | ICD-10-CM | POA: Diagnosis present

## 2024-02-09 DIAGNOSIS — Z043 Encounter for examination and observation following other accident: Secondary | ICD-10-CM | POA: Diagnosis not present

## 2024-02-09 DIAGNOSIS — M51369 Other intervertebral disc degeneration, lumbar region without mention of lumbar back pain or lower extremity pain: Secondary | ICD-10-CM | POA: Diagnosis not present

## 2024-02-09 DIAGNOSIS — S86812A Strain of other muscle(s) and tendon(s) at lower leg level, left leg, initial encounter: Secondary | ICD-10-CM | POA: Insufficient documentation

## 2024-02-09 MED ORDER — TRAMADOL HCL 50 MG PO TABS
50.0000 mg | ORAL_TABLET | ORAL | Status: AC
  Administered 2024-02-09: 50 mg via ORAL
  Filled 2024-02-09: qty 1

## 2024-02-09 MED ORDER — TRAMADOL HCL 50 MG PO TABS: 50.0000 mg | ORAL_TABLET | Freq: Four times a day (QID) | ORAL | 0 refills | Status: AC | PRN

## 2024-02-09 NOTE — ED Provider Notes (Signed)
 [facility]rielle [facility] EMERGENCY DEPARTMENT AT [facility] Provider Note   CSN: [id number] Arrival date & time: 02/09/24  1504     History  Chief Complaint  Patient presents with   Coleridge Davenport    Gabrielle Hawkins is a 58 y.o. female.  58 year old female who presents emergency department with left knee pain.  Patient reports that she slipped and fell.  Her left knee flexed and her foot went under her buttocks.  She did feel a pop.  Since then has been having significant left medial knee pain.       Home Medications Prior to Admission medications   Medication Sig Start Date End Date Taking? Authorizing Provider  traMADol  (ULTRAM ) 50 MG tablet Take 1 tablet (50 mg total) by mouth every 6 (six) hours as needed. 02/09/24  Yes Ninetta Basket, MD  albuterol  (VENTOLIN  HFA) 108 [phone] Base) MCG/ACT inhaler Inhale 2 puffs into the lungs every 6 (six) hours as needed for wheezing or shortness of breath. 05/30/23   Susanna Epley, FNP  amitriptyline  (ELAVIL ) 50 MG tablet Take 1 tablet (50 mg total) by mouth at bedtime. 01/02/24   Susanna Epley, FNP  atorvastatin  (LIPITOR) 20 MG tablet TAKE ONE TABLET BY MOUTH ONCE DAILY 12/25/23   Susanna Epley, FNP  BREO ELLIPTA  200-25 MCG/ACT AEPB Inhale into the lungs.    [provider]  busPIRone  (BUSPAR ) 5 MG tablet TAKE ONE TABLET BY MOUTH AT BEDTIME 12/12/23   Susanna Epley, FNP  diphenhydrAMINE  (BENADRYL ) 25 mg capsule Take 25 mg by mouth every 6 (six) hours as needed for allergies.    [provider]  EPINEPHrine  (EPIPEN  2-PAK) 0.3 mg/0.3 mL IJ SOAJ injection Inject 0.3 mg into the muscle as needed for anaphylaxis. 07/25/22   Moore, Janece, FNP  eszopiclone  (LUNESTA ) 2 MG TABS tablet Take immediately before bedtime 09/11/23   Susanna Epley, FNP  folic acid (FOLVITE) 1 MG tablet Take 3 mg by mouth daily.    [provider]  golimumab  (SIMPONI  ARIA) 50 MG/4ML SOLN injection 50 mg See admin instructions. Infusion every 8 weeks, [place]  r[facility]    [provider]  hydroxychloroquine (PLAQUENIL) 200 MG tablet Take 200 mg by mouth 2 (two) times daily. 07/12/23   [provider]  levocetirizine (XYZAL ) 5 MG tablet TAKE ONE TABLET BY MOUTH EVERY EVENING 10/09/23   [place]ster Chuck, MD  methotrexate (50 MG/ML) 1 g injection Inject 1 g into the vein every Monday.    [provider]  olmesartan  (BENICAR ) 20 MG tablet TAKE ONE TABLET BY MOUTH ONCE DAILY 12/12/23   Moore, Janece, FNP  omeprazole  (PRILOSEC) 40 MG capsule Take 1 capsule (40 mg total) by mouth daily. 04/29/21   Prescott Brodie, MD  OVER THE COUNTER MEDICATION Aleve  cream for pain    [provider]  tiZANidine (ZANAFLEX) 2 MG tablet Take 2 mg by mouth 3 (three) times daily.    [provider]  Vitamin D , Ergocalciferol , (DRISDOL ) 1.25 MG (50000 UNIT) CAPS capsule Take 1 capsule (50,000 Units total) by mouth every 7 (seven) days. 11/21/23   Susanna Epley, FNP  inFLIXimab  (REMICADE ) 100 MG injection Inject 100 mg into the vein every 30 (thirty) days. Infuse by intravenous route every 4 weeks  Patient is receiving Inflectra  brand verses Remicade  brand due to insurance Patient not taking: Reported on 04/10/2020  11/02/20  [provider]      Allergies    Humira [adalimumab], Ampicillin, Codeine, Compazine [prochlorperazine maleate], Cymbalta [duloxetine  hcl], Imitrex [sumatriptan base], Lyrica [pregabalin], Metoclopramide hcl, Percocet [oxycodone -acetaminophen ], Effexor [venlafaxine hydrochloride], Fentanyl , and Tape    Review of Systems   Review of Systems  Physical Exam Updated Vital Signs BP 103/66   Pulse (!) 103   Temp 97.8 F (36.6 C)   Resp 18   Ht 5\' 7"  (1.702 m)   Wt 88 kg   SpO2 100%   BMI 30.38 kg/m  Physical Exam Musculoskeletal:     Comments: Negative Lachman's trauma.  No significant laxity with valgus or varus testing of the left leg.  Tenderness to palpation along the medial aspect of the  left knee extending up into the thigh.  DP pulses 2+ bilaterally.  Intact sensation to light touch of the dermatomes of the left lower extremity.  Patient able to flex and extend knee and hip.     ED Results / Procedures / Treatments   Labs (all labs ordered are listed, but only abnormal results are displayed) Labs Reviewed - No data to display  EKG None  Radiology DG Hip Unilat With Pelvis 2-3 Views Left Result Date: 02/09/2024 CLINICAL DATA:  fall EXAM: DG HIP (WITH OR WITHOUT PELVIS) 2-3V LEFT COMPARISON:  August 08, 2015 FINDINGS: No evidence of pelvic fracture or diastasis.No dislocation.Mild joint space loss of both hips with osteophyte formation. Degenerative disc disease of the lower lumbar spine.Soft tissues are unremarkable. IMPRESSION: 1. No acute fracture, pelvic bone diastasis, or dislocation. 2. Mild-to-moderate of both hips. Electronically Signed   By: Rance Burrows M.D.   On: 02/09/2024 16:15   DG Knee Complete 4 Views Left Result Date: 02/09/2024 CLINICAL DATA:  Status post fall. EXAM: LEFT KNEE - COMPLETE 4+ VIEW COMPARISON:  None Available. FINDINGS: No evidence of an acute fracture, dislocation, or joint effusion. No evidence of arthropathy or other focal bone abnormality. Soft tissues are unremarkable. IMPRESSION: Negative. Electronically Signed   By: Virgle Grime M.D.   On: 02/09/2024 16:10    Procedures Procedures    Medications Ordered in ED Medications  traMADol  (ULTRAM ) tablet 50 mg (50 mg Oral Given 02/09/24 1644)    ED Course/ Medical Decision Making/ A&P                                 Medical Decision Making Amount and/or Complexity of Data Reviewed Radiology: ordered.  Risk Prescription drug management.   58 year old female who presents emergency department with knee pain after a fall  Initial Ddx:  Fracture, sprain, MCL injury, meniscus injury  MDM/Course:  Patient presents to the emergency department with left knee pain.  Appears to  be mostly on the medial aspect of her knee after falling and having a hyper flexion injury of her knee.  On exam does have tenderness to palpation of the medial aspect of her knee.  No significant joint laxity noted.  No significant swelling.  Had x-rays of her knee that did not show evidence of fracture.  Suspect that she may have a MCL injury that is minor.  Given a knee immobilizer and crutches and instructed to follow-up with orthopedics as an outpatient.  This patient presents to the ED for concern of complaints listed in HPI, this involves an extensive number of treatment options, and is a complaint that carries with it a high risk of complications and morbidity. Disposition including potential need for admission considered.   Dispo: DC Home. Return precautions discussed including, but not limited  to, those listed in the AVS. Allowed pt time to ask questions which were answered fully prior to dc.  Additional history obtained from spouse Records reviewed Outpatient Clinic Notes I independently reviewed the following imaging with scope of interpretation limited to determining acute life threatening conditions related to emergency care: Extremity x-ray(s) and agree with the radiologist interpretation with the following exceptions: none I have reviewed the patients home medications and made adjustments as needed  Portions of this note were generated with Dragon dictation software. Dictation errors may occur despite best attempts at proofreading.     Final Clinical Impression(s) / ED Diagnoses Final diagnoses:  Strain of left knee, initial encounter    Rx / DC Orders ED Discharge Orders          Ordered    traMADol  (ULTRAM ) 50 MG tablet  Every 6 hours PRN        02/09/24 1631              Ninetta Basket, MD 02/10/24 1154

## 2024-02-09 NOTE — Discharge Instructions (Signed)
 You were seen for your knee injury in the emergency department.  You may have a torn muscle or ligament.  At home, please use Tylenol  and ibuprofen  for pain.  You may use tramadol  for breakthrough pain.  Do not take this medication before driving or operating heavy machinery because it can make you drowsy.    Ice your knee and keep it elevated to limit the swelling.  Use the knee immobilizer for comfort.  You may weight-bear as tolerated on your leg.  Please also use the crutches for comfort.  Check your MyChart online for the results of any tests that had not resulted by the time you left the emergency department.   Follow-up with orthopedics in 1 week regarding your knee pain.  Return immediately to the emergency department if you experience any of the following: Worsening pain, or any other concerning symptoms.    Thank you for visiting our Emergency Department. It was a pleasure taking care of you today.

## 2024-02-09 NOTE — ED Triage Notes (Signed)
 Pt reports she fell out of bed last night and has left knee and hip pain.

## 2024-02-12 DIAGNOSIS — K625 Hemorrhage of anus and rectum: Secondary | ICD-10-CM | POA: Diagnosis not present

## 2024-02-12 DIAGNOSIS — K5190 Ulcerative colitis, unspecified, without complications: Secondary | ICD-10-CM | POA: Diagnosis not present

## 2024-02-14 ENCOUNTER — Ambulatory Visit: Payer: 59 | Admitting: Allergy & Immunology

## 2024-02-14 DIAGNOSIS — K5190 Ulcerative colitis, unspecified, without complications: Secondary | ICD-10-CM | POA: Diagnosis not present

## 2024-02-19 NOTE — Patient Instructions (Signed)
 Visit Information  Thank you for taking time to visit with me today. Please don't hesitate to contact me if I can be of assistance to you before our next scheduled appointment.  Your next care management appointment is by telephone on 07/18 at 9 AM  Please call the care guide team at [phone] if you need to cancel, schedule, or reschedule an appointment.   Please call the Suicide and Crisis Lifeline: 988 call 1-800-273-TALK (toll free, 24 hour hotline) call 911 if you are experiencing a Mental Health or Behavioral Health Crisis or need someone to talk to.  Alease Hunter, LCSW [place]  [facility], [facility] Clinical Social Worker Direct Dial: [phone]  Fax: [phone] Website: [email or web address] 8:56 AM

## 2024-02-19 NOTE — Patient Outreach (Signed)
 Complex Care Management   Visit Note  02/09/2024  Name:  Gabrielle Hawkins MRN: [id number] DOB: 03/06/1966  Situation: Referral received for Complex Care Management related to SDOH Barriers:  Stress Grief I obtained verbal consent from Patient.  Visit completed with pt  on the phone  Background:   Past Medical History:  Diagnosis Date   Anemia 1998   Anxiety    Arthritis    big toe    Asthma    BRCA1 negative    BRCA2 negative    Breast cancer (HCC) 2006   chem/radiation/2years tamoxifen   Chronic neck and back pain    Crohn's disease (HCC)    Depression    Endometriosis    Fibroid    GERD (gastroesophageal reflux disease)    History of chemotherapy 01/2005   Hx of tamoxifen therapy    Hx of transfusion of packed red blood cells    Hypertension    IBS (irritable bowel syndrome)    Kidney stones    Lymphedema of arm    Migraines    just at dx of breast CA   Mucinous cystadenoma of ovary    left   Neuropathy    Paresthesias    Peripheral neuropathy    Personal history of chemotherapy    Personal history of radiation therapy    Radiation 04/2005   Ulcerative colitis (HCC)    with chronic diarrhea   Urinary tract infection    x 3   Vertigo 2014   Vitamin D  deficiency     Assessment: Patient Reported Symptoms:  Cognitive Cognitive Status: Alert and oriented to person, place, and time, Normal speech and language skills Cognitive/Intellectual Conditions Management [RPT]: None reported or documented in medical history or problem list      Neurological Neurological Review of Symptoms: No symptoms reported    HEENT HEENT Symptoms Reported: No symptoms reported      Cardiovascular Cardiovascular Symptoms Reported: No symptoms reported Cardiovascular Conditions: Hypertension Cardiovascular Management Strategies: Adequate rest, Routine screening, Medication therapy  Respiratory Respiratory Symptoms Reported: No symptoms reported    Endocrine Patient reports  the following symptoms related to hypoglycemia or hyperglycemia : No symptoms reported Is patient diabetic?: No    Gastrointestinal Gastrointestinal Symptoms Reported: Not assessed      Genitourinary Genitourinary Symptoms Reported: No symptoms reported    Integumentary Integumentary Symptoms Reported: No symptoms reported    Musculoskeletal Musculoskelatal Symptoms Reviewed: Muscle pain Additional Musculoskeletal Details: Patient reports she fell while trying to get out of bed. Knee and hip pain. Manageable as long as she is not bending. Been using ice and elevating it. Pt was encouraged by [facility] to establish with ortho, pt will think about it Musculoskeletal Conditions: Joint pain, Rheumatoid arthritis Musculoskeletal Management Strategies: Coping strategies, Medication therapy, Routine screening Falls in the past year?: Yes Number of falls in past year: 2 or more Was there an injury with Fall?: No Fall Risk Category Calculator: 2 Patient Fall Risk Level: Moderate Fall Risk Patient at Risk for Falls Due to: History of fall(s) Fall risk Follow up: Falls prevention discussed  Psychosocial Psychosocial Symptoms Reported: Other Other Psychosocial Conditions: Grief Additional Psychological Details: Patient continues to grieve loved ones, who were huge supports for her. Encouragement and validation provided. Strategies to assist with symptom management discussed Behavioral Management Strategies: Coping strategies, Support system, Adequate rest Major Change/Loss/Stressor/Fears (CP): Death of a loved one, Medical condition, self        12/19/2023    1:00  PM  Depression screen PHQ 2/9  Decreased Interest 1  Down, Depressed, Hopeless 1  PHQ - 2 Score 2  Altered sleeping 1  Tired, decreased energy 0  Change in appetite 1  Feeling bad or failure about yourself  0  Trouble concentrating 0  Moving slowly or fidgety/restless 0  Suicidal thoughts 0  PHQ-9 Score 4  Difficult doing  work/chores Not difficult at all    There were no vitals filed for this visit.  Medications Reviewed Today     Reviewed by Shantay Sonn D, LCSW (Social Worker) on 02/19/24 at [phone]  Med List Status: <None>   Medication Order Taking? Sig Documenting Provider Last Dose Status Informant  albuterol  (VENTOLIN  HFA) 108 (90 Base) MCG/ACT inhaler [id number] No Inhale 2 puffs into the lungs every 6 (six) hours as needed for wheezing or shortness of breath. Susanna Epley, FNP Taking Active   amitriptyline  (ELAVIL ) 50 MG tablet [id number]  Take 1 tablet (50 mg total) by mouth at bedtime. Susanna Epley, FNP  Active   atorvastatin  (LIPITOR) 20 MG tablet [id number] No TAKE ONE TABLET BY MOUTH ONCE DAILY Susanna Epley, FNP Taking Active   BREO ELLIPTA  200-25 MCG/ACT AEPB [id number] No Inhale into the lungs. [provider] Taking Active   busPIRone  (BUSPAR ) 5 MG tablet [id number] No TAKE ONE TABLET BY MOUTH AT BEDTIME Susanna Epley, FNP Taking Active   diphenhydrAMINE  (BENADRYL ) 25 mg capsule [id number] No Take 25 mg by mouth every 6 (six) hours as needed for allergies. [provider] Taking Active   EPINEPHrine  (EPIPEN  2-PAK) 0.3 mg/0.3 mL IJ SOAJ injection [id number] No Inject 0.3 mg into the muscle as needed for anaphylaxis. Susanna Epley, FNP Taking Active   eszopiclone  (LUNESTA ) 2 MG TABS tablet [id number] No Take immediately before bedtime Susanna Epley, FNP Taking Active   folic acid (FOLVITE) 1 MG tablet [id number] No Take 3 mg by mouth daily. [provider] Taking Active Self  golimumab  (SIMPONI  ARIA) 50 MG/4ML SOLN injection [id number] No 50 mg See admin instructions. Infusion every 8 weeks, [facility] [provider] Taking Active Self  hydroxychloroquine (PLAQUENIL) 200 MG tablet [id number] No Take 200 mg by mouth 2 (two) times daily. [provider] Taking Active   Patient not taking:  Discontinued 11/02/20 0709          Med Note (LITTLE, ANGEL L    Wed Feb 26, 2020 10:00 AM)    levocetirizine (XYZAL ) 5 MG tablet [id number] No TAKE ONE TABLET BY MOUTH EVERY EVENING Gallagher, Joel Louis, MD Taking Active   methotrexate (50 MG/ML) 1 g injection [id number] No Inject 1 g into the vein every Monday. [provider] Taking Active Self  olmesartan  (BENICAR ) 20 MG tablet [id number] No TAKE ONE TABLET BY MOUTH ONCE DAILY Susanna Epley, FNP Taking Active   omeprazole  (PRILOSEC) 40 MG capsule [id number] No Take 1 capsule (40 mg total) by mouth daily. Prescott Brodie, MD Taking Active Self  OVER THE COUNTER MEDICATION [id number] No Aleve  cream for pain [provider] Taking Active Self  tiZANidine (ZANAFLEX) 2 MG tablet [id number] No Take 2 mg by mouth 3 (three) times daily. [provider] Taking Active Self           Med Note (LITTLE, ANGEL L   Tue Dec 19, 2023 12:51 PM) Patient is taking as needed   traMADol  (ULTRAM ) 50 MG tablet [id number]  Take 1 tablet (50 mg total) by mouth every 6 (six) hours as needed. Ninetta Basket,  MD  Active   Vitamin D , Ergocalciferol , (DRISDOL ) 1.25 MG (50000 UNIT) CAPS capsule [id number] No Take 1 capsule (50,000 Units total) by mouth every 7 (seven) days. Susanna Epley, FNP Taking Active             Recommendation:   Continue Current Plan of Care  Follow Up Plan:   Telephone follow-up in 1 month  Alease Hunter, LCSW [facility]  [facility], [facility] Clinical Social Worker Direct Dial: [phone]  Fax: [phone] Website: [email or web address] 8:55 AM

## 2024-02-27 DIAGNOSIS — K5190 Ulcerative colitis, unspecified, without complications: Secondary | ICD-10-CM | POA: Diagnosis not present

## 2024-02-27 DIAGNOSIS — M459 Ankylosing spondylitis of unspecified sites in spine: Secondary | ICD-10-CM | POA: Diagnosis not present

## 2024-03-07 ENCOUNTER — Telehealth: Payer: Self-pay

## 2024-03-15 ENCOUNTER — Encounter: Payer: Self-pay | Admitting: Nurse Practitioner

## 2024-03-15 ENCOUNTER — Other Ambulatory Visit: Payer: Self-pay

## 2024-03-15 DIAGNOSIS — G4700 Insomnia, unspecified: Secondary | ICD-10-CM

## 2024-03-15 DIAGNOSIS — I10 Essential (primary) hypertension: Secondary | ICD-10-CM

## 2024-03-15 MED ORDER — OLMESARTAN MEDOXOMIL 20 MG PO TABS: 20.0000 mg | ORAL_TABLET | Freq: Every day | ORAL | 1 refills | Status: AC

## 2024-03-15 MED ORDER — AMITRIPTYLINE HCL 50 MG PO TABS: 50.0000 mg | ORAL_TABLET | Freq: Every day | ORAL | 1 refills | Status: AC

## 2024-03-15 MED ORDER — ESZOPICLONE 2 MG PO TABS: ORAL_TABLET | ORAL | 2 refills | Status: DC

## 2024-03-15 MED ORDER — ATORVASTATIN CALCIUM 20 MG PO TABS: 20.0000 mg | ORAL_TABLET | Freq: Every day | ORAL | 1 refills | Status: AC

## 2024-03-22 ENCOUNTER — Other Ambulatory Visit: Payer: Self-pay | Admitting: Licensed Clinical Social Worker

## 2024-03-22 NOTE — Patient Outreach (Signed)
 Complex Care Management   Visit Note  03/22/2024  Name:  Gabrielle Hawkins MRN: [id number] DOB: 03/06/1966  Situation: Referral received for Complex Care Management related to Grief and Caregiver Stress I obtained verbal consent from Patient.  Visit completed with pt  on the phone  Background:   Past Medical History:  Diagnosis Date   Anemia 1998   Anxiety    Arthritis    big toe    Asthma    BRCA1 negative    BRCA2 negative    Breast cancer (HCC) 2006   chem/radiation/2years tamoxifen   Chronic neck and back pain    Crohn's disease (HCC)    Depression    Endometriosis    Fibroid    GERD (gastroesophageal reflux disease)    History of chemotherapy 01/2005   Hx of tamoxifen therapy    Hx of transfusion of packed red blood cells    Hypertension    IBS (irritable bowel syndrome)    Kidney stones    Lymphedema of arm    Migraines    just at dx of breast CA   Mucinous cystadenoma of ovary    left   Neuropathy    Paresthesias    Peripheral neuropathy    Personal history of chemotherapy    Personal history of radiation therapy    Radiation 04/2005   Ulcerative colitis (HCC)    with chronic diarrhea   Urinary tract infection    x 3   Vertigo 2014   Vitamin D  deficiency     Assessment: Patient Reported Symptoms:  Cognitive Cognitive Status: Alert and oriented to person, place, and time, Normal speech and language skills Cognitive/Intellectual Conditions Management [RPT]: None reported or documented in medical history or problem list   Health Maintenance Behaviors: Annual physical exam, Spiritual practice(s), Stress management  Neurological Neurological Review of Symptoms: No symptoms reported    HEENT HEENT Symptoms Reported: Not assessed      Cardiovascular Cardiovascular Symptoms Reported: No symptoms reported    Respiratory Respiratory Symptoms Reported: No symptoms reported    Endocrine Endocrine Symptoms Reported: Not assessed    Gastrointestinal  Gastrointestinal Symptoms Reported: Abdominal pain or discomfort Additional Gastrointestinal Details: Ulcerative Colitis. Pt reports recent flare up; however, is feeling better with rest and medication management Gastrointestinal Management Strategies: Medication therapy, Adequate rest, Coping strategies    Genitourinary Genitourinary Symptoms Reported: No symptoms reported    Integumentary Integumentary Symptoms Reported: No symptoms reported    Musculoskeletal          Psychosocial Psychosocial Symptoms Reported: Other Other Psychosocial Conditions: Grief/Caregiver Stress Additional Psychological Details: Patient continues to practice healthy coping skills to assist in promoting positive mood and decrease in symptoms. She is currently visiting with family for the summer. Strategies to assist with reflection and identifying pt needs discussed Behavioral Management Strategies: Adequate rest, Coping strategies, Support system Major Change/Loss/Stressor/Fears (CP): Death of a loved one, Medical condition, self, Medical condition, family Techniques to [place] with Loss/Stress/Change: Diversional activities Quality of Family Relationships: helpful, involved, supportive      12/19/2023    1:00 PM  Depression screen PHQ 2/9  Decreased Interest 1  Down, Depressed, Hopeless 1  PHQ - 2 Score 2  Altered sleeping 1  Tired, decreased energy 0  Change in appetite 1  Feeling bad or failure about yourself  0  Trouble concentrating 0  Moving slowly or fidgety/restless 0  Suicidal thoughts 0  PHQ-9 Score 4  Difficult doing work/chores Not difficult at  all    There were no vitals filed for this visit.  Medications Reviewed Today     Reviewed by [name], LCSW (Social Worker) on 03/22/24 at 0908  Med List Status: <None>   Medication Order Taking? Sig Documenting Provider Last Dose Status Informant  albuterol  (VENTOLIN  HFA) 108 (90 Base) MCG/ACT inhaler [id number] No Inhale 2 puffs into  the lungs every 6 (six) hours as needed for wheezing or shortness of breath. Georgina Speaks, FNP Taking Active   amitriptyline  (ELAVIL ) 50 MG tablet [id number]  Take 1 tablet (50 mg total) by mouth at bedtime. Georgina Speaks, FNP  Active   atorvastatin  (LIPITOR) 20 MG tablet [id number]  Take 1 tablet (20 mg total) by mouth daily. Georgina Speaks, FNP  Active   BREO ELLIPTA  200-25 MCG/ACT AEPB [id number] No Inhale into the lungs. [provider] Taking Active   busPIRone  (BUSPAR ) 5 MG tablet [id number] No TAKE ONE TABLET BY MOUTH AT BEDTIME Georgina Speaks, FNP Taking Active   diphenhydrAMINE  (BENADRYL ) 25 mg capsule [id number] No Take 25 mg by mouth every 6 (six) hours as needed for allergies. [provider] Taking Active   EPINEPHrine  (EPIPEN  2-PAK) 0.3 mg/0.3 mL IJ SOAJ injection [id number] No Inject 0.3 mg into the muscle as needed for anaphylaxis. Georgina Speaks, FNP Taking Active   eszopiclone  (LUNESTA ) 2 MG TABS tablet [id number]  Take immediately before bedtime Georgina Speaks, FNP  Active   folic acid (FOLVITE) 1 MG tablet [id number] No Take 3 mg by mouth daily. [provider] Taking Active Self  golimumab  (SIMPONI  ARIA) 50 MG/4ML SOLN injection [id number] No 50 mg See admin instructions. Infusion every 8 weeks, [facility] [provider] Taking Active Self  hydroxychloroquine (PLAQUENIL) 200 MG tablet [id number] No Take 200 mg by mouth 2 (two) times daily. [provider] Taking Active   Patient not taking:  Discontinued 11/02/20 0709          Med Note (LITTLE, ANGEL L   Wed Feb 26, 2020 10:00 AM)    levocetirizine (XYZAL ) 5 MG tablet [id number] No TAKE ONE TABLET BY MOUTH EVERY EVENING [name], MD Taking Active   methotrexate (50 MG/ML) 1 g injection [id number] No Inject 1 g into the vein every Monday. [provider] Taking Active Self  olmesartan  (BENICAR ) 20 MG tablet [id number]  Take 1 tablet (20 mg total) by mouth daily.  Georgina Speaks, FNP  Active   omeprazole  (PRILOSEC) 40 MG capsule [id number] No Take 1 capsule (40 mg total) by mouth daily. [name], MD Taking Active Self  OVER THE COUNTER MEDICATION [id number] No Aleve  cream for pain [provider] Taking Active Self  tiZANidine (ZANAFLEX) 2 MG tablet [id number] No Take 2 mg by mouth 3 (three) times daily. [provider] Taking Active Self           Med Note (LITTLE, ANGEL L   Tue Dec 19, 2023 12:51 PM) Patient is taking as needed   traMADol  (ULTRAM ) 50 MG tablet [id number]  Take 1 tablet (50 mg total) by mouth every 6 (six) hours as needed. Yolande Lamar BROCKS, MD  Active   Vitamin D , Ergocalciferol , (DRISDOL ) 1.25 MG (50000 UNIT) CAPS capsule [id number] No Take 1 capsule (50,000 Units total) by mouth every 7 (seven) days. Georgina Speaks, FNP Taking Active             Recommendation:   Continue Current Plan of Care  Follow Up Plan:   Telephone follow-up in 1 month  Rolin Ezzard HUGHS [facility]  [facility], [facility] Clinical Social Worker Direct Dial: [phone]  Fax: [phone] Website: [email or web address] 10:01 AM

## 2024-03-22 NOTE — Patient Instructions (Signed)
 Visit Information  Thank you for taking time to visit with me today. Please don't hesitate to contact me if I can be of assistance to you before our next scheduled appointment.  Your next care management appointment is by telephone on 08/29 at 9 AM   Please call the care guide team at [phone] if you need to cancel, schedule, or reschedule an appointment.   Please call the Suicide and Crisis Lifeline: 988 call 1-800-273-TALK (toll free, 24 hour hotline) call 911 if you are experiencing a Mental Health or Behavioral Health Crisis or need someone to talk to.  Rolin Kerns, LCSW [place]  [facility], [facility] Clinical Social Worker Direct Dial: [phone]  Fax: [phone] Website: [email or web address] 10:02 AM

## 2024-04-05 ENCOUNTER — Other Ambulatory Visit: Payer: Self-pay

## 2024-04-05 NOTE — Patient Instructions (Signed)
 Visit Information  Thank you for taking time to visit with me today. Please don't hesitate to contact me if I can be of assistance to you before our next scheduled appointment.  Your next care management appointment is by telephone on Thursday, September 4 at 12:15 PM  Please call the care guide team at [phone] if you need to cancel, schedule, or reschedule an appointment.   Please call 1-800-273-TALK (toll free, 24 hour hotline) if you are experiencing a Mental Health or Behavioral Health Crisis or need someone to talk to.  Clayborne Ly RN BSN CCM [place]  [facility], [facility] Nurse Care Coordinator  Direct Dial: [phone] Website: [email or web address]

## 2024-04-05 NOTE — Patient Outreach (Signed)
 Complex Care Management   Visit Note  04/05/2024  Name:  Gabrielle Hawkins MRN: [id number] DOB: 05/23/1966  Situation: Referral received for Complex Care Management related to  left hip pain, Ulcerative Colitis; RA, grief, Asthma flare. I obtained verbal consent from Patient.  Visit completed with patient on the phone.  Background:   Past Medical History:  Diagnosis Date   Anemia 1998   Anxiety    Arthritis    big toe    Asthma    BRCA1 negative    BRCA2 negative    Breast cancer (HCC) 2006   chem/radiation/2years tamoxifen   Chronic neck and back pain    Crohn's disease (HCC)    Depression    Endometriosis    Fibroid    GERD (gastroesophageal reflux disease)    History of chemotherapy 01/2005   Hx of tamoxifen therapy    Hx of transfusion of packed red blood cells    Hypertension    IBS (irritable bowel syndrome)    Kidney stones    Lymphedema of arm    Migraines    just at dx of breast CA   Mucinous cystadenoma of ovary    left   Neuropathy    Paresthesias    Peripheral neuropathy    Personal history of chemotherapy    Personal history of radiation therapy    Radiation 04/2005   Ulcerative colitis (HCC)    with chronic diarrhea   Urinary tract infection    x 3   Vertigo 2014   Vitamin D  deficiency     Assessment: Patient Reported Symptoms:  Cognitive Cognitive Status: Alert and oriented to person, place, and time, Normal speech and language skills Cognitive/Intellectual Conditions Management [RPT]: None reported or documented in medical history or problem list   Health Maintenance Behaviors: Annual physical exam, Stress management Health Facilitated by: Stress management, Healthy diet, Rest  Neurological Neurological Review of Symptoms: No symptoms reported    HEENT HEENT Symptoms Reported: Not assessed      Cardiovascular Cardiovascular Symptoms Reported: Swelling in legs or feet, Lightheadness Does patient have uncontrolled Hypertension?:  No Cardiovascular Management Strategies: Medication therapy, Routine screening, Fluid modification Cardiovascular Self-Management Outcome: 3 (uncertain)  Respiratory Respiratory Symptoms Reported: Productive cough, Shortness of breath Respiratory Management Strategies: Routine screening, Medication therapy, Adequate rest Respiratory Self-Management Outcome: 3 (uncertain)  Endocrine Endocrine Symptoms Reported: Not assessed    Gastrointestinal Gastrointestinal Symptoms Reported: Abdominal pain or discomfort, Bleeding, Diarrhea Additional Gastrointestinal Details: recent UC flare treated with steroids, symptoms have improved Gastrointestinal Management Strategies: Medication therapy, Adequate rest, Fluid modification Gastrointestinal Self-Management Outcome: 3 (uncertain)    Genitourinary Genitourinary Symptoms Reported: Not assessed    Integumentary Integumentary Symptoms Reported: Not assessed    Musculoskeletal Musculoskelatal Symptoms Reviewed: Difficulty walking, Joint pain Additional Musculoskeletal Details: strain of left knee following a fall Musculoskeletal Management Strategies: Adequate rest, Routine screening Musculoskeletal Self-Management Outcome: 3 (uncertain) Falls in the past year?: Yes Number of falls in past year: 1 or less Was there an injury with Fall?: Yes Fall Risk Category Calculator: 2 Patient Fall Risk Level: Moderate Fall Risk Patient at Risk for Falls Due to: History of fall(s), Impaired mobility Fall risk Follow up: Falls evaluation completed, Falls prevention discussed, Education provided  Psychosocial Psychosocial Symptoms Reported: No symptoms reported   Major Change/Loss/Stressor/Fears (CP): Death of a loved one, Medical condition, family, Medical condition, self Techniques to Cope with Loss/Stress/Change: Diversional activities Quality of Family Relationships: supportive      12/19/2023  1:00 PM  Depression screen PHQ 2/9  Decreased Interest 1   Down, Depressed, Hopeless 1  PHQ - 2 Score 2  Altered sleeping 1  Tired, decreased energy 0  Change in appetite 1  Feeling bad or failure about yourself  0  Trouble concentrating 0  Moving slowly or fidgety/restless 0  Suicidal thoughts 0  PHQ-9 Score 4  Difficult doing work/chores Not difficult at all    There were no vitals filed for this visit.  Medications Reviewed Today     Reviewed by Morgan Clayborne CROME, RN (Registered Nurse) on 04/05/24 at 1319  Med List Status: <None>   Medication Order Taking? Sig Documenting Provider Last Dose Status Informant  albuterol  (VENTOLIN  HFA) 108 (90 Base) MCG/ACT inhaler [id number] No Inhale 2 puffs into the lungs every 6 (six) hours as needed for wheezing or shortness of breath. Georgina Speaks, FNP Taking Active   amitriptyline  (ELAVIL ) 50 MG tablet [id number]  Take 1 tablet (50 mg total) by mouth at bedtime. Georgina Speaks, FNP  Active   atorvastatin  (LIPITOR) 20 MG tablet [id number]  Take 1 tablet (20 mg total) by mouth daily. Georgina Speaks, FNP  Active   BREO ELLIPTA  200-25 MCG/ACT AEPB [id number] No Inhale into the lungs. [provider] Taking Active   busPIRone  (BUSPAR ) 5 MG tablet [id number] No TAKE ONE TABLET BY MOUTH AT BEDTIME Georgina Speaks, FNP Taking Active   diphenhydrAMINE  (BENADRYL ) 25 mg capsule [id number] No Take 25 mg by mouth every 6 (six) hours as needed for allergies. [provider] Taking Active   EPINEPHrine  (EPIPEN  2-PAK) 0.3 mg/0.3 mL IJ SOAJ injection [id number] No Inject 0.3 mg into the muscle as needed for anaphylaxis. Georgina Speaks, FNP Taking Active   eszopiclone  (LUNESTA ) 2 MG TABS tablet [id number]  Take immediately before bedtime Georgina Speaks, FNP  Active   folic acid (FOLVITE) 1 MG tablet [id number] No Take 3 mg by mouth daily. [provider] Taking Active Self  golimumab  (SIMPONI  ARIA) 50 MG/4ML SOLN injection [id number] No 50 mg See admin instructions. Infusion every 8 weeks, [place]  [facility] [provider] Taking Active Self  hydroxychloroquine (PLAQUENIL) 200 MG tablet [id number] No Take 200 mg by mouth 2 (two) times daily. [provider] Taking Active   Patient not taking:  Discontinued 11/02/20 0709          Med Note (Jakobi Thetford L   Wed Feb 26, 2020 10:00 AM)    levocetirizine (XYZAL ) 5 MG tablet [id number] No TAKE ONE TABLET BY MOUTH EVERY EVENING [name], MD Taking Active   methotrexate (50 MG/ML) 1 g injection [id number] No Inject 1 g into the vein every Monday. [provider] Taking Active Self  olmesartan  (BENICAR ) 20 MG tablet [id number]  Take 1 tablet (20 mg total) by mouth daily. Georgina Speaks, FNP  Active   omeprazole  (PRILOSEC) 40 MG capsule [id number] No Take 1 capsule (40 mg total) by mouth daily. [name], MD Taking Active Self  OVER THE COUNTER MEDICATION [id number] No Aleve  cream for pain [provider] Taking Active Self  tiZANidine (ZANAFLEX) 2 MG tablet [id number] No Take 2 mg by mouth 3 (three) times daily. [provider] Taking Active Self           Med Note (Seleena Reimers L   Tue Dec 19, 2023 12:51 PM) Patient is taking as needed   traMADol  (ULTRAM ) 50 MG tablet [id number]  Take 1 tablet (50 mg total) by mouth every 6 (six) hours  as needed. Yolande Lamar BROCKS, MD  Active   Vitamin D , Ergocalciferol , (DRISDOL ) 1.25 MG (50000 UNIT) CAPS capsule [id number] No Take 1 capsule (50,000 Units total) by mouth every 7 (seven) days. Georgina Speaks, FNP Taking Active             Recommendation:   Specialty provider follow-up as directed for evaluation of Ulcerative Colitis and Asthma flare  Continue Current Plan of Care  Follow Up Plan:   Telephone follow up appointment date/time:  Thursday, September 4 at 12:15 PM  Clayborne Ly RN BSN CCM [place]  [facility], [facility] Nurse Care Coordinator  Direct Dial: [phone] Website:  [email or web address]

## 2024-04-29 ENCOUNTER — Other Ambulatory Visit: Payer: Self-pay | Admitting: Nurse Practitioner

## 2024-04-29 DIAGNOSIS — R5383 Other fatigue: Secondary | ICD-10-CM | POA: Diagnosis not present

## 2024-04-29 DIAGNOSIS — K5190 Ulcerative colitis, unspecified, without complications: Secondary | ICD-10-CM | POA: Diagnosis not present

## 2024-04-29 DIAGNOSIS — M459 Ankylosing spondylitis of unspecified sites in spine: Secondary | ICD-10-CM | POA: Diagnosis not present

## 2024-04-29 DIAGNOSIS — G4700 Insomnia, unspecified: Secondary | ICD-10-CM

## 2024-04-29 DIAGNOSIS — Z79899 Other long term (current) drug therapy: Secondary | ICD-10-CM | POA: Diagnosis not present

## 2024-05-02 ENCOUNTER — Other Ambulatory Visit: Payer: Self-pay | Admitting: Nurse Practitioner

## 2024-05-02 DIAGNOSIS — E66811 Obesity, class 1: Secondary | ICD-10-CM

## 2024-05-03 ENCOUNTER — Other Ambulatory Visit: Payer: Self-pay | Admitting: Licensed Clinical Social Worker

## 2024-05-03 NOTE — Patient Outreach (Signed)
 Complex Care Management   Visit Note  05/03/2024  Name:  Gabrielle Hawkins MRN: [id number] DOB: [birth year]  Situation: Referral received for Complex Care Management related to Caregiver Stress I obtained verbal consent from Patient.  Visit completed with Patient  on the phone  Background:   Past Medical History:  Diagnosis Date   Anemia 1998   Anxiety    Arthritis    big toe    Asthma    BRCA1 negative    BRCA2 negative    Breast cancer (HCC) 2006   chem/radiation/2years tamoxifen   Chronic neck and back pain    Crohn's disease (HCC)    Depression    Endometriosis    Fibroid    GERD (gastroesophageal reflux disease)    History of chemotherapy 01/2005   Hx of tamoxifen therapy    Hx of transfusion of packed red blood cells    Hypertension    IBS (irritable bowel syndrome)    Kidney stones    Lymphedema of arm    Migraines    just at dx of breast CA   Mucinous cystadenoma of ovary    left   Neuropathy    Paresthesias    Peripheral neuropathy    Personal history of chemotherapy    Personal history of radiation therapy    Radiation 04/2005   Ulcerative colitis (HCC)    with chronic diarrhea   Urinary tract infection    x 3   Vertigo 2014   Vitamin D  deficiency     Assessment: Patient Reported Symptoms:  Cognitive Cognitive Status: No symptoms reported, Alert and oriented to person, place, and time, Normal speech and language skills Cognitive/Intellectual Conditions Management [RPT]: None reported or documented in medical history or problem list      Neurological Neurological Review of Symptoms: Not assessed    HEENT HEENT Symptoms Reported: Not assessed      Cardiovascular Cardiovascular Symptoms Reported: Not assessed    Respiratory Respiratory Symptoms Reported: Not assesed    Endocrine Endocrine Symptoms Reported: Not assessed    Gastrointestinal Gastrointestinal Symptoms Reported: Not assessed      Genitourinary Genitourinary Symptoms  Reported: Not assessed    Integumentary Integumentary Symptoms Reported: Not assessed    Musculoskeletal Musculoskelatal Symptoms Reviewed: Difficulty walking, Joint pain Musculoskeletal Management Strategies: Adequate rest, Routine screening      Psychosocial Psychosocial Symptoms Reported: Other Other Psychosocial Conditions: Stress Behavioral Management Strategies: Adequate rest, Coping strategies, Support system Major Change/Loss/Stressor/Fears (CP): Medical condition, self, Medical condition, family, Death of a loved one Techniques to Cope with Loss/Stress/Change: Diversional activities      05/03/2024    PHQ2-9 Depression Screening   Little interest or pleasure in doing things    Feeling down, depressed, or hopeless    PHQ-2 - Total Score    Trouble falling or staying asleep, or sleeping too much    Feeling tired or having little energy    Poor appetite or overeating     Feeling bad about yourself - or that you are a failure or have let yourself or your family down    Trouble concentrating on things, such as reading the newspaper or watching television    Moving or speaking so slowly that other people could have noticed.  Or the opposite - being so fidgety or restless that you have been moving around a lot more than usual    Thoughts that you would be better off dead, or hurting yourself in some way  PHQ2-9 Total Score    If you checked off any problems, how difficult have these problems made it for you to do your work, take care of things at home, or get along with other people    Depression Interventions/Treatment      There were no vitals filed for this visit.  Medications Reviewed Today     Reviewed by Ezzard Rolin BIRCH, LCSW (Social Worker) on 05/03/24 at [phone]  Med List Status: <None>   Medication Order Taking? Sig Documenting Provider Last Dose Status Informant  albuterol  (VENTOLIN  HFA) 108 (90 Base) MCG/ACT inhaler [id number] No Inhale 2 puffs into the lungs every  6 (six) hours as needed for wheezing or shortness of breath. Georgina Speaks, FNP Taking Active   amitriptyline  (ELAVIL ) 50 MG tablet [id number]  Take 1 tablet (50 mg total) by mouth at bedtime. Georgina Speaks, FNP  Active   atorvastatin  (LIPITOR) 20 MG tablet [id number]  Take 1 tablet (20 mg total) by mouth daily. Georgina Speaks, FNP  Active   BREO ELLIPTA  200-25 MCG/ACT AEPB [id number] No Inhale into the lungs. [provider] Taking Active   busPIRone  (BUSPAR ) 5 MG tablet [id number] No TAKE ONE TABLET BY MOUTH AT BEDTIME Georgina Speaks, FNP Taking Active   diphenhydrAMINE  (BENADRYL ) 25 mg capsule [id number] No Take 25 mg by mouth every 6 (six) hours as needed for allergies. [provider] Taking Active   EPINEPHrine  (EPIPEN  2-PAK) 0.3 mg/0.3 mL IJ SOAJ injection [id number] No Inject 0.3 mg into the muscle as needed for anaphylaxis. Georgina Speaks, FNP Taking Active   eszopiclone  (LUNESTA ) 2 MG TABS tablet [id number]  TAKE ONE TABLET BY MOUTH BEFORE BEDTIME Georgina Speaks, FNP  Active   folic acid (FOLVITE) 1 MG tablet [id number] No Take 3 mg by mouth daily. [provider] Taking Active Self  golimumab  (SIMPONI  ARIA) 50 MG/4ML SOLN injection [id number] No 50 mg See admin instructions. Infusion every 8 weeks, [place] [facility] [provider] Taking Active Self  hydroxychloroquine (PLAQUENIL) 200 MG tablet [id number] No Take 200 mg by mouth 2 (two) times daily. [provider] Taking Active   Patient not taking:  Discontinued 11/02/20 0709          Med Note (LITTLE, ANGEL L   Wed Feb 26, 2020 10:00 AM)    levocetirizine (XYZAL ) 5 MG tablet [id number] No TAKE ONE TABLET BY MOUTH EVERY EVENING [name], MD Taking Active   methotrexate (50 MG/ML) 1 g injection [id number] No Inject 1 g into the vein every Monday. [provider] Taking Active Self  olmesartan  (BENICAR ) 20 MG tablet [id number]  Take 1 tablet (20 mg total) by mouth daily. Georgina Speaks, FNP  Active   omeprazole  (PRILOSEC) 40 MG capsule [id number] No Take 1 capsule (40 mg total) by mouth daily. [name], MD Taking Active Self  OVER THE COUNTER MEDICATION [id number] No Aleve  cream for pain [provider] Taking Active Self  tiZANidine (ZANAFLEX) 2 MG tablet [id number] No Take 2 mg by mouth 3 (three) times daily. [provider] Taking Active Self           Med Note (LITTLE, ANGEL L   Tue Dec 19, 2023 12:51 PM) Patient is taking as needed   traMADol  (ULTRAM ) 50 MG tablet [id number]  Take 1 tablet (50 mg total) by mouth every 6 (six) hours as needed. Yolande Lamar BROCKS, MD  Active   Vitamin D , Ergocalciferol , (DRISDOL ) 1.25 MG (50000 UNIT) CAPS capsule [id number] No Take 1  capsule (50,000 Units total) by mouth every 7 (seven) days. Georgina Speaks, FNP Taking Active             Recommendation:   RNCM Follow up scheduled 09/04 PCP Follow-up scheduled 09/17 Continue Current Plan of Care  Follow Up Plan:   Telephone follow-up in 1 month  Rolin Kerns, LCSW [place]  [facility], [facility] Clinical Social Worker Direct Dial: [phone]  Fax: [phone] Website: [email or web address] 9:41 AM

## 2024-05-03 NOTE — Patient Instructions (Signed)
 Visit Information  Thank you for taking time to visit with me today. Please don't hesitate to contact me if I can be of assistance to you before our next scheduled appointment.  Your next care management appointment is by telephone on 09/26 at 9 AM  Please call the care guide team at [phone] if you need to cancel, schedule, or reschedule an appointment.   Please call the Suicide and Crisis Lifeline: 988 call 911 if you are experiencing a Mental Health or Behavioral Health Crisis or need someone to talk to.  Rolin Kerns, LCSW [place]  [facility], [facility] Clinical Social Worker Direct Dial: [phone]  Fax: [phone] Website: [email or web address] 9:42 AM

## 2024-05-07 ENCOUNTER — Ambulatory Visit: Admitting: Nurse Practitioner

## 2024-05-09 ENCOUNTER — Other Ambulatory Visit: Payer: Self-pay

## 2024-05-09 NOTE — Patient Outreach (Signed)
 Complex Care Management   Visit Note  05/09/2024  Name:  Gabrielle Hawkins MRN: [id number] DOB: Jan 14, 1966  Situation: Referral received for Complex Care Management related to left hip pain, Ulcerative Colitis; RA, Obesity. I obtained verbal consent from Patient.  Visit completed with Patient on the phone.  Background:   Past Medical History:  Diagnosis Date   Anemia 1998   Anxiety    Arthritis    big toe    Asthma    BRCA1 negative    BRCA2 negative    Breast cancer (HCC) 2006   chem/radiation/2years tamoxifen   Chronic neck and back pain    Crohn's disease (HCC)    Depression    Endometriosis    Fibroid    GERD (gastroesophageal reflux disease)    History of chemotherapy 01/2005   Hx of tamoxifen therapy    Hx of transfusion of packed red blood cells    Hypertension    IBS (irritable bowel syndrome)    Kidney stones    Lymphedema of arm    Migraines    just at dx of breast CA   Mucinous cystadenoma of ovary    left   Neuropathy    Paresthesias    Peripheral neuropathy    Personal history of chemotherapy    Personal history of radiation therapy    Radiation 04/2005   Ulcerative colitis (HCC)    with chronic diarrhea   Urinary tract infection    x 3   Vertigo 2014   Vitamin D  deficiency     Assessment: Patient Reported Symptoms:  Cognitive Cognitive Status: Alert and oriented to person, place, and time, Normal speech and language skills Cognitive/Intellectual Conditions Management [RPT]: None reported or documented in medical history or problem list   Health Maintenance Behaviors: Annual physical exam, Stress management Health Facilitated by: Healthy diet, Rest, Stress management  Neurological Neurological Review of Symptoms: No symptoms reported    HEENT HEENT Symptoms Reported: No symptoms reported      Cardiovascular Cardiovascular Symptoms Reported: No symptoms reported Does patient have uncontrolled Hypertension?: No    Respiratory Respiratory  Symptoms Reported: No symptoms reported    Endocrine Endocrine Symptoms Reported: No symptoms reported Is patient diabetic?: No    Gastrointestinal Gastrointestinal Symptoms Reported: Other Other Gastrointestinal Symptoms: Obesity Additional Gastrointestinal Details: recent UC flare has resolved Gastrointestinal Management Strategies: Adequate rest, Coping strategies, Medication therapy Gastrointestinal Self-Management Outcome: 4 (good)    Genitourinary Genitourinary Symptoms Reported: No symptoms reported    Integumentary Integumentary Symptoms Reported: No symptoms reported    Musculoskeletal Musculoskelatal Symptoms Reviewed: No symptoms reported        Psychosocial Psychosocial Symptoms Reported: No symptoms reported   Major Change/Loss/Stressor/Fears (CP): Medical condition, family, Medical condition, self Techniques to Cope with Loss/Stress/Change: Counseling, Medication, Spiritual practice(s) Quality of Family Relationships: supportive Do you feel physically threatened by others?: No    05/09/2024    PHQ2-9 Depression Screening   Anara Cowman interest or pleasure in doing things    Feeling down, depressed, or hopeless    PHQ-2 - Total Score    Trouble falling or staying asleep, or sleeping too much    Feeling tired or having [place] Dingus energy    Poor appetite or overeating     Feeling bad about yourself - or that you are a failure or have let yourself or your family down    Trouble concentrating on things, such as reading the newspaper or watching television    Moving or speaking  so slowly that other people could have noticed.  Or the opposite - being so fidgety or restless that you have been moving around a lot more than usual    Thoughts that you would be better off dead, or hurting yourself in some way    PHQ2-9 Total Score    If you checked off any problems, how difficult have these problems made it for you to do your work, take care of things at home, or get along with other  people    Depression Interventions/Treatment      There were no vitals filed for this visit.  Medications Reviewed Today     Reviewed by Morgan Clayborne CROME, RN (Registered Nurse) on 05/09/24 at 1233  Med List Status: <None>   Medication Order Taking? Sig Documenting Provider Last Dose Status Informant  albuterol  (VENTOLIN  HFA) 108 (90 Base) MCG/ACT inhaler [id number]  Inhale 2 puffs into the lungs every 6 (six) hours as needed for wheezing or shortness of breath. Georgina Speaks, FNP  Active   amitriptyline  (ELAVIL ) 50 MG tablet [id number]  Take 1 tablet (50 mg total) by mouth at bedtime. Georgina Speaks, FNP  Active   atorvastatin  (LIPITOR) 20 MG tablet [id number]  Take 1 tablet (20 mg total) by mouth daily. Georgina Speaks, FNP  Active   BREO ELLIPTA  200-25 MCG/ACT AEPB [id number]  Inhale into the lungs. [provider]  Active   busPIRone  (BUSPAR ) 5 MG tablet [id number]  TAKE ONE TABLET BY MOUTH AT BEDTIME Georgina Speaks, FNP  Active   diphenhydrAMINE  (BENADRYL ) 25 mg capsule [id number]  Take 25 mg by mouth every 6 (six) hours as needed for allergies. [provider]  Active   EPINEPHrine  (EPIPEN  2-PAK) 0.3 mg/0.3 mL IJ SOAJ injection [id number]  Inject 0.3 mg into the muscle as needed for anaphylaxis. Georgina Speaks, FNP  Active   eszopiclone  (LUNESTA ) 2 MG TABS tablet [id number]  TAKE ONE TABLET BY MOUTH BEFORE BEDTIME Georgina Speaks, FNP  Active   folic acid (FOLVITE) 1 MG tablet [id number]  Take 3 mg by mouth daily. [provider]  Active Self  golimumab  (SIMPONI  ARIA) 50 MG/4ML SOLN injection [id number]  50 mg See admin instructions. Infusion every 8 weeks, [place] [facility] [provider]  Active Self  hydroxychloroquine (PLAQUENIL) 200 MG tablet [id number]  Take 200 mg by mouth 2 (two) times daily. [provider]  Active    Patient not taking:   Discontinued 11/02/20 0709          Med Note (Tiara Bartoli L   Wed Feb 26, 2020 10:00 AM)    levocetirizine  (XYZAL ) 5 MG tablet [id number]  TAKE ONE TABLET BY MOUTH EVERY EVENING [name], MD  Active   methotrexate (50 MG/ML) 1 g injection [id number]  Inject 1 g into the vein every Monday. [provider]  Active Self  olmesartan  (BENICAR ) 20 MG tablet [id number]  Take 1 tablet (20 mg total) by mouth daily. Georgina Speaks, FNP  Active   omeprazole  (PRILOSEC) 40 MG capsule [id number]  Take 1 capsule (40 mg total) by mouth daily. [name], MD  Active Self  OVER THE COUNTER MEDICATION [id number]  Aleve  cream for pain [provider]  Active Self  predniSONE  (DELTASONE ) 20 MG tablet [id number] Yes Take 20 mg by mouth daily with breakfast. [provider]  Active   tiZANidine (ZANAFLEX) 2 MG tablet [id number]  Take 2 mg by mouth 3 (three) times daily. [provider]  Active Self  Med Note (Kei Mcelhiney L   Tue Dec 19, 2023 12:51 PM) Patient is taking as needed   traMADol  (ULTRAM ) 50 MG tablet [id number]  Take 1 tablet (50 mg total) by mouth every 6 (six) hours as needed. Yolande Lamar BROCKS, MD  Active   Vitamin D , Ergocalciferol , (DRISDOL ) 1.25 MG (50000 UNIT) CAPS capsule [id number]  Take 1 capsule (50,000 Units total) by mouth every 7 (seven) days. Georgina Speaks, FNP  Active             Recommendation:   PCP Follow-up Specialty provider follow-up with Dr. Rollin and Dr. Lavell as directed for management of RA and UC   Follow Up Plan:   Telephone follow up appointment with social worker Rolin Kerns LCSW scheduled for date/time: Friday, September 26 at 09:00 AM Telephone follow up appointment with nurse care manager scheduled for date/time:  Thursday, October 2 at 12:15 PM  Clayborne Ly RN BSN CCM [place]  [facility], [facility] Health Nurse Care Coordinator  Direct Dial: [phone] Website: [email or web address]

## 2024-05-09 NOTE — Patient Instructions (Signed)
 Visit Information  Thank you for taking time to visit with me today. Please don't hesitate to contact me if I can be of assistance to you before our next scheduled appointment.  Your next care management appointment is by telephone on Thursday, October 2 at 12:15 PM  Please call the care guide team at [phone] if you need to cancel, schedule, or reschedule an appointment.   Please call [phone]-TALK (toll free, 24 hour hotline) if you are experiencing a Mental Health or Behavioral Health Crisis or need someone to talk to.  Clayborne Ly RN BSN CCM [place]  [facility], [facility] Health Nurse Care Coordinator  Direct Dial: [phone] Website: [email or web address]

## 2024-05-10 ENCOUNTER — Other Ambulatory Visit: Payer: Self-pay | Admitting: Nurse Practitioner

## 2024-05-10 DIAGNOSIS — E6609 Other obesity due to excess calories: Secondary | ICD-10-CM

## 2024-05-13 ENCOUNTER — Encounter: Payer: Self-pay | Admitting: Nurse Practitioner

## 2024-05-17 ENCOUNTER — Other Ambulatory Visit: Payer: Self-pay | Admitting: Nurse Practitioner

## 2024-05-17 DIAGNOSIS — E66811 Obesity, class 1: Secondary | ICD-10-CM

## 2024-05-22 ENCOUNTER — Ambulatory Visit: Admitting: Family Medicine

## 2024-05-22 ENCOUNTER — Encounter: Payer: Self-pay | Admitting: Nurse Practitioner

## 2024-05-22 VITALS — BP 120/82 | HR 98 | Temp 98.3°F | Ht 67.0 in | Wt 203.0 lb

## 2024-05-22 DIAGNOSIS — K51919 Ulcerative colitis, unspecified with unspecified complications: Secondary | ICD-10-CM | POA: Diagnosis not present

## 2024-05-22 DIAGNOSIS — E7800 Pure hypercholesterolemia, unspecified: Secondary | ICD-10-CM

## 2024-05-22 DIAGNOSIS — E559 Vitamin D deficiency, unspecified: Secondary | ICD-10-CM | POA: Diagnosis not present

## 2024-05-22 DIAGNOSIS — Z6831 Body mass index (BMI) 31.0-31.9, adult: Secondary | ICD-10-CM

## 2024-05-22 DIAGNOSIS — K5190 Ulcerative colitis, unspecified, without complications: Secondary | ICD-10-CM | POA: Diagnosis not present

## 2024-05-22 DIAGNOSIS — E66811 Obesity, class 1: Secondary | ICD-10-CM

## 2024-05-22 DIAGNOSIS — I10 Essential (primary) hypertension: Secondary | ICD-10-CM | POA: Diagnosis not present

## 2024-05-22 DIAGNOSIS — Z79899 Other long term (current) drug therapy: Secondary | ICD-10-CM

## 2024-05-22 DIAGNOSIS — E6609 Other obesity due to excess calories: Secondary | ICD-10-CM

## 2024-05-22 DIAGNOSIS — Z23 Encounter for immunization: Secondary | ICD-10-CM | POA: Diagnosis not present

## 2024-05-22 NOTE — Progress Notes (Signed)
 LILLETTE Kristeen JINNY Gladis, CMA,acting as a Neurosurgeon for Bruna Creighton, NP.,have documented all relevant documentation on the behalf of Bruna Creighton, NP,as directed by  Bruna Creighton, NP while in the presence of Bruna Creighton, NP.  Subjective:  Patient ID: Gabrielle Hawkins , female    DOB: Nov 11, 1965 , 58 y.o.   MRN: [id number]  Chief Complaint  Patient presents with   Hypertension    Patient presents today for a bp follow up, Patient reports compliance with medication. Patient denies any chest pain, SOB, or headaches. Patient would like to start medication.   Weight Check    Patient reports she is back taking her weight loss medication. She reports she notices she has been really thirsty.     HPI .Discussed the use of AI scribe software for clinical note transcription with the patient, who gave verbal consent to proceed.  History of Present Illness      Unnamed Hino is a 58 year old female with ulcerative colitis who presents for weight management and blood pressure check.  She has a history of ulcerative colitis diagnosed in 2015 and receives infusions of Simponi  Aria every other month. In July, she experienced a severe flare-up with bloody mucus, abdominal pain, and rectal pressure, the worst since her diagnosis. She was prescribed prednisone , initially at 40 mg daily, now tapered to 20 mg. She experiences side effects from prednisone , including facial puffiness and blurred vision, requiring reading glasses for TV. The flare-up was possibly triggered by stress related to caregiving responsibilities and family health issues.  She is concerned about weight gain, which she attributes partly to prednisone  use. Her BMI is 31, and she feels overweight despite eating well and exercising regularly, including walking her dog. She previously used phentermine , which helped curb cravings, and plans to resume it as it did not exacerbate her ulcerative colitis.  She has a history of high blood pressure  and elevated cholesterol, for which she takes losartan and cholesterol medication. She also takes methotrexate weekly and Plaquenil. She is not diabetic but is concerned about potential effects of prednisone  on her blood sugar levels and requests an A1c test. She also takes vitamin D  supplements for a deficiency.  Her family history includes her mother's stroke and shingles, which has influenced her health vigilance. She wants to maintain her health to avoid similar issues. No chest pain, shortness of breath, or nervousness. She reports feeling better than in previous months.  She is encouraged to strive for BMI less than 30 to decrease cardiac risk. Advised to aim for at least 150 minutes of exercise per week.      Past Medical History:  Diagnosis Date   Anemia 1998   Anxiety    Arthritis    big toe    Asthma    BRCA1 negative    BRCA2 negative    Breast cancer (HCC) 2006   chem/radiation/2years tamoxifen   Chronic neck and back pain    Crohn's disease (HCC)    Depression    Endometriosis    Fibroid    GERD (gastroesophageal reflux disease)    History of chemotherapy 01/2005   Hx of tamoxifen therapy    Hx of transfusion of packed red blood cells    Hypertension    IBS (irritable bowel syndrome)    Kidney stones    Lymphedema of arm    Migraines    just at dx of breast CA   Mucinous cystadenoma of ovary  left   Neuropathy    Paresthesias    Peripheral neuropathy    Personal history of chemotherapy    Personal history of radiation therapy    Radiation 04/2005   Ulcerative colitis (HCC)    with chronic diarrhea   Urinary tract infection    x 3   Vertigo 2014   Vitamin D  deficiency      Family History  Problem Relation Age of Onset   Breast cancer Mother 21       2nd primary dx 86; neg GT   Heart failure Father    Heart disease Father    Leukemia Father 105       ? chronic; survelliance only   Colon cancer Maternal Grandmother        d. 4s   Heart failure  Paternal Grandmother    Heart disease Paternal Grandmother    Cervical cancer Paternal Grandmother        dx 90s   Prostate cancer Paternal Grandfather        dx >60; mets to bone   Cancer Other        MGF's nephews w/ prostate and pancreatic cancer; MGFs niece w/ lung cancer; MGF's sister w/ unk cancer   Breast cancer Maternal Great-grandmother        d. >15; MGF's mother     Current Outpatient Medications:    albuterol  (VENTOLIN  HFA) 108 (90 Base) MCG/ACT inhaler, Inhale 2 puffs into the lungs every 6 (six) hours as needed for wheezing or shortness of breath., Disp: 8.5 g, Rfl: 2   amitriptyline  (ELAVIL ) 50 MG tablet, Take 1 tablet (50 mg total) by mouth at bedtime., Disp: 90 tablet, Rfl: 1   atorvastatin  (LIPITOR) 20 MG tablet, Take 1 tablet (20 mg total) by mouth daily., Disp: 90 tablet, Rfl: 1   BREO ELLIPTA  200-25 MCG/ACT AEPB, Inhale into the lungs., Disp: , Rfl:    busPIRone  (BUSPAR ) 5 MG tablet, TAKE ONE TABLET BY MOUTH AT BEDTIME, Disp: 90 tablet, Rfl: 1   diphenhydrAMINE  (BENADRYL ) 25 mg capsule, Take 25 mg by mouth every 6 (six) hours as needed for allergies., Disp: , Rfl:    EPINEPHrine  (EPIPEN  2-PAK) 0.3 mg/0.3 mL IJ SOAJ injection, Inject 0.3 mg into the muscle as needed for anaphylaxis., Disp: 1 each, Rfl: 2   eszopiclone  (LUNESTA ) 2 MG TABS tablet, TAKE ONE TABLET BY MOUTH BEFORE BEDTIME, Disp: 30 tablet, Rfl: 5   folic acid (FOLVITE) 1 MG tablet, Take 3 mg by mouth daily., Disp: , Rfl:    golimumab  (SIMPONI  ARIA) 50 MG/4ML SOLN injection, 50 mg See admin instructions. Infusion every 8 weeks, [facility], Disp: , Rfl:    hydroxychloroquine (PLAQUENIL) 200 MG tablet, Take 200 mg by mouth 2 (two) times daily., Disp: , Rfl:    levocetirizine (XYZAL ) 5 MG tablet, TAKE ONE TABLET BY MOUTH EVERY EVENING, Disp: 30 tablet, Rfl: 0   methotrexate (50 MG/ML) 1 g injection, Inject 1 g into the vein every Monday., Disp: , Rfl:    olmesartan  (BENICAR ) 20 MG tablet, Take 1  tablet (20 mg total) by mouth daily., Disp: 90 tablet, Rfl: 1   omeprazole  (PRILOSEC) 40 MG capsule, Take 1 capsule (40 mg total) by mouth daily., Disp: 30 capsule, Rfl: 3   OVER THE COUNTER MEDICATION, Aleve  cream for pain, Disp: , Rfl:    predniSONE  (DELTASONE ) 20 MG tablet, Take 20 mg by mouth daily with breakfast., Disp: , Rfl:    tiZANidine (ZANAFLEX) 2 MG  tablet, Take 2 mg by mouth 3 (three) times daily., Disp: , Rfl:    traMADol  (ULTRAM ) 50 MG tablet, Take 1 tablet (50 mg total) by mouth every 6 (six) hours as needed., Disp: 15 tablet, Rfl: 0   Vitamin D , Ergocalciferol , (DRISDOL ) 1.25 MG (50000 UNIT) CAPS capsule, Take 1 capsule (50,000 Units total) by mouth every 7 (seven) days., Disp: 24 capsule, Rfl: 2   phentermine  15 MG capsule, Take 1 capsule (15 mg total) by mouth every morning., Disp: 30 capsule, Rfl: 1   Allergies  Allergen Reactions   Humira [Adalimumab] Anaphylaxis   Ampicillin Hives and Itching   Codeine    Compazine [Prochlorperazine Maleate] Other (See Comments)    Stroke-like symptoms Can tolerate Phenergan .   Cymbalta [Duloxetine Hcl] Other (See Comments)    Fainting   Imitrex [Sumatriptan Base] Hives and Nausea And Vomiting   Lyrica [Pregabalin] Other (See Comments)    Fainting   Metoclopramide Hcl Hives   Percocet [Oxycodone -Acetaminophen ] Itching   Effexor [Venlafaxine Hydrochloride] Hives and Palpitations   Fentanyl  Nausea And Vomiting   Tape Itching and Rash     Review of Systems  Constitutional: Negative.   HENT: Negative.    Respiratory: Negative.    Cardiovascular: Negative.   Gastrointestinal: Negative.   Musculoskeletal: Negative.   Skin: Negative.   Psychiatric/Behavioral: Negative.       Today's Vitals   05/22/24 1453  BP: 120/82  Pulse: 98  Temp: 98.3 F (36.8 C)  TempSrc: Oral  Weight: 203 lb (92.1 kg)  Height: 5' 7 (1.702 m)  PainSc: 0-No pain   Body mass index is 31.79 kg/m.  Wt Readings from Last 3 Encounters:  05/22/24  203 lb (92.1 kg)  02/09/24 194 lb (88 kg)  01/02/24 198 lb 6.4 oz (90 kg)    The 10-year ASCVD risk score (Arnett DK, et al., 2019) is: 1.8%   Values used to calculate the score:     Age: 55 years     Clincally relevant sex: Female     Is Non-Hispanic African American: Yes     Diabetic: No     Tobacco smoker: No     Systolic Blood Pressure: 110 mmHg     Is BP treated: Yes     HDL Cholesterol: 76 mg/dL     Total Cholesterol: 148 mg/dL  Objective:  Physical Exam HENT:     Head: Normocephalic.  Cardiovascular:     Rate and Rhythm: Normal rate and regular rhythm.  Pulmonary:     Effort: Pulmonary effort is normal.     Breath sounds: Normal breath sounds.  Skin:    General: Skin is warm and dry.  Neurological:     General: No focal deficit present.     Mental Status: She is alert and oriented to person, place, and time.  Psychiatric:        Mood and Affect: Mood normal.        Behavior: Behavior normal.         Assessment And Plan:  Essential hypertension Assessment & Plan: -Chronic. Stable, Controlled with olmesartan  20 mg every day .  -Continue Olmesartan  for blood pressure management. - Monitor heart rate, especially with phentermine  use.  Orders: -     BMP8+eGFR -     CBC with Differential/Platelet  Need for zoster vaccination -     Varicella-zoster vaccine IM  Mild chronic ulcerative colitis with complication [facility]) Assessment & Plan: Managed with Simponi  Aria. Recent severe flare managed  with prednisone  tapering. Rectal bleeding resolved. - Continue tapering prednisone  as directed by GI specialist. - Perform blood work including A1c to monitor for prednisone -induced hyperglycemia. - Perform stool culture for inflammation markers as per GI specialist's request.  Orders: -     Calprotectin, Fecal  Pure hypercholesterolemia Assessment & Plan: Check lipids; continue atorvastatin  20 mg every day.  Orders: -     Lipid panel  Drug therapy -     Hemoglobin  A1c  Vitamin D  deficiency Assessment & Plan: - Perform vitamin D  blood test.  Orders: -     VITAMIN D  25 Hydroxy (Vit-D Deficiency, Fractures)  Class 1 obesity due to excess calories without serious comorbidity with body mass index (BMI) of 31.0 to 31.9 in adult Assessment & Plan: Obesity, class 1 BMI 31, slightly above ideal. Phentermine  15 mg effective for weight loss and cravings without affecting ulcerative colitis.  She is encouraged to strive for BMI less than 30 to decrease cardiac risk. Advised to aim for at least 150 minutes of exercise per week.      Assessment & Plan Obesity, class 1 BMI 31, slightly above ideal. Phentermine  15 mg effective for weight loss and cravings without affecting ulcerative colitis.   Ulcerative colitis Managed with Simponi  Aria. Recent severe flare managed with prednisone  tapering. Rectal bleeding resolved. - Continue tapering prednisone  as directed by GI specialist. - Perform blood work including A1c to monitor for prednisone -induced hyperglycemia. - Perform stool culture for inflammation markers as per GI specialist's request.  Essential hypertension Controlled with losartan. Elevated heart rate possibly due to phentermine . - Continue losartan for blood pressure management. - Monitor heart rate, especially with phentermine  use.  Hyperlipidemia Managed with cholesterol medication. She requested cholesterol check. - Perform cholesterol blood test.  Vitamin D  deficiency Managed with 50,000 units vitamin D  as needed. She requested vitamin D  level check.   General Health Maintenance Concerned about shingles. Requested varicella vaccine. - Administer varicella vaccine for shingles prevention.  Follow-Up Regular follow-ups with GI specialist and primary care every six months. Next physical in January. - Schedule follow-up appointment in January for physical examination.   Return if symptoms worsen or fail to improve.  Patient was  given opportunity to ask questions. Patient verbalized understanding of the plan and was able to repeat key elements of the plan. All questions were answered to their satisfaction.   I, Bruna Creighton, NP, have reviewed all documentation for this visit. The documentation on 05/31/2024 for the exam, diagnosis, procedures, and orders are all accurate and complete.   IF YOU HAVE BEEN REFERRED TO A SPECIALIST, IT MAY TAKE 1-2 WEEKS TO SCHEDULE/PROCESS THE REFERRAL. IF YOU HAVE NOT HEARD FROM US /SPECIALIST IN TWO WEEKS, PLEASE GIVE US  A CALL AT [phone] X [phone].

## 2024-05-22 NOTE — Patient Instructions (Signed)

## 2024-05-23 DIAGNOSIS — M459 Ankylosing spondylitis of unspecified sites in spine: Secondary | ICD-10-CM | POA: Diagnosis not present

## 2024-05-23 DIAGNOSIS — K5100 Ulcerative (chronic) pancolitis without complications: Secondary | ICD-10-CM | POA: Diagnosis not present

## 2024-05-23 LAB — BMP8+EGFR
BUN/Creatinine Ratio: 11 (ref 9–23)
BUN: 12 mg/dL (ref 6–24)
CO2: 23 mmol/L (ref 20–29)
Calcium: 9.5 mg/dL (ref 8.7–10.2)
Chloride: 102 mmol/L (ref 96–106)
Creatinine, Ser: 1.14 mg/dL — ABNORMAL HIGH (ref 0.57–1.00)
Glucose: 74 mg/dL (ref 70–99)
Potassium: 4.2 mmol/L (ref 3.5–5.2)
Sodium: 141 mmol/L (ref 134–144)
eGFR: 56 mL/min/1.73 — ABNORMAL LOW (ref >59)

## 2024-05-23 LAB — CBC WITH DIFFERENTIAL/PLATELET
Basophils Absolute: 0.1 x10E3/uL (ref 0.0–0.2)
Basos: 1 %
EOS (ABSOLUTE): 0.1 x10E3/uL (ref 0.0–0.4)
Eos: 1 %
Hematocrit: 43.3 % (ref 34.0–46.6)
Hemoglobin: 13.7 g/dL (ref 11.1–15.9)
Immature Grans (Abs): 0 x10E3/uL (ref 0.0–0.1)
Immature Granulocytes: 0 %
Lymphocytes Absolute: 3.2 x10E3/uL — ABNORMAL HIGH (ref 0.7–3.1)
Lymphs: 44 %
MCH: 29.9 pg (ref 26.6–33.0)
MCHC: 31.6 g/dL (ref 31.5–35.7)
MCV: 95 fL (ref 79–97)
Monocytes Absolute: 0.5 x10E3/uL (ref 0.1–0.9)
Monocytes: 7 %
Neutrophils Absolute: 3.4 x10E3/uL (ref 1.4–7.0)
Neutrophils: 47 %
Platelets: 198 x10E3/uL (ref 150–450)
RBC: 4.58 x10E6/uL (ref 3.77–5.28)
RDW: 13.4 % (ref 11.7–15.4)
WBC: 7.2 x10E3/uL (ref 3.4–10.8)

## 2024-05-23 LAB — LIPID PANEL
Chol/HDL Ratio: 1.9 ratio (ref 0.0–4.4)
Cholesterol, Total: 148 mg/dL (ref 100–199)
HDL: 76 mg/dL (ref >39)
LDL Chol Calc (NIH): 58 mg/dL (ref 0–99)
Triglycerides: 72 mg/dL (ref 0–149)
VLDL Cholesterol Cal: 14 mg/dL (ref 5–40)

## 2024-05-23 LAB — HEMOGLOBIN A1C
Est. average glucose Bld gHb Est-mCnc: 123 mg/dL
Hgb A1c MFr Bld: 5.9 % — ABNORMAL HIGH (ref 4.8–5.6)

## 2024-05-23 LAB — VITAMIN D 25 HYDROXY (VIT D DEFICIENCY, FRACTURES): Vit D, 25-Hydroxy: 23.5 ng/mL — ABNORMAL LOW (ref 30.0–100.0)

## 2024-05-26 ENCOUNTER — Encounter: Payer: Self-pay | Admitting: Family Medicine

## 2024-05-31 ENCOUNTER — Telehealth: Payer: Self-pay | Admitting: Licensed Clinical Social Worker

## 2024-05-31 ENCOUNTER — Other Ambulatory Visit: Payer: Self-pay | Admitting: Nurse Practitioner

## 2024-05-31 MED ORDER — PHENTERMINE HCL 15 MG PO CAPS: 15.0000 mg | ORAL_CAPSULE | ORAL | 1 refills | Status: AC

## 2024-06-02 ENCOUNTER — Ambulatory Visit: Payer: Self-pay | Admitting: Family Medicine

## 2024-06-02 ENCOUNTER — Other Ambulatory Visit: Payer: Self-pay | Admitting: Family Medicine

## 2024-06-02 DIAGNOSIS — K51919 Ulcerative colitis, unspecified with unspecified complications: Secondary | ICD-10-CM | POA: Insufficient documentation

## 2024-06-02 DIAGNOSIS — Z79899 Other long term (current) drug therapy: Secondary | ICD-10-CM | POA: Insufficient documentation

## 2024-06-02 DIAGNOSIS — Z23 Encounter for immunization: Secondary | ICD-10-CM | POA: Insufficient documentation

## 2024-06-02 NOTE — Progress Notes (Signed)
 Your A1c is 5.9 which puts you in the pre diabetes category.Low carbs , diet and exercise advised.Your kidney function seems to have dropped, your GFR has reduced to 56. Make sure your BP is well controlled .  Your vitamin D  is low, continue your once weekly vitamin D  supplement.  Thank you!

## 2024-06-02 NOTE — Assessment & Plan Note (Signed)
 Check lipids; continue atorvastatin  20 mg every day.

## 2024-06-02 NOTE — Assessment & Plan Note (Signed)
-  Chronic. Stable, Controlled with olmesartan  20 mg every day .  -Continue Olmesartan  for blood pressure management. - Monitor heart rate, especially with phentermine  use.

## 2024-06-02 NOTE — Assessment & Plan Note (Addendum)
 Obesity, class 1 BMI 31, slightly above ideal. Phentermine  15 mg effective for weight loss and cravings without affecting ulcerative colitis.  She is encouraged to strive for BMI less than 30 to decrease cardiac risk. Advised to aim for at least 150 minutes of exercise per week.

## 2024-06-02 NOTE — Assessment & Plan Note (Signed)
-   Perform vitamin D  blood test.

## 2024-06-02 NOTE — Assessment & Plan Note (Signed)
 Managed with Simponi  Aria. Recent severe flare managed with prednisone  tapering. Rectal bleeding resolved. - Continue tapering prednisone  as directed by GI specialist. - Perform blood work including A1c to monitor for prednisone -induced hyperglycemia. - Perform stool culture for inflammation markers as per GI specialist's request.

## 2024-06-06 ENCOUNTER — Other Ambulatory Visit

## 2024-06-06 NOTE — Patient Instructions (Signed)
 Visit Information  Thank you for taking time to visit with me today. Please don't hesitate to contact me if I can be of assistance to you before our next scheduled appointment.  Your next care management appointment is by telephone on Wednesday, October 22 at 12:00 PM  Please call the care guide team at [phone] if you need to cancel, schedule, or reschedule an appointment.   Please call [phone]-TALK (toll free, 24 hour hotline) if you are experiencing a Mental Health or Behavioral Health Crisis or need someone to talk to.  Clayborne Ly RN BSN CCM [place], [facility] Nurse Care Coordinator  Direct Dial: [phone] Website: [email or web address]

## 2024-06-06 NOTE — Patient Outreach (Addendum)
 Complex Care Management   Visit Note  06/06/2024  Name:  Gabrielle Hawkins MRN: [id number] DOB: 1966/04/11  Situation: Referral received for Complex Care Management related to left hip pain, Ulcerative Colitis, RA, Obesity. I obtained verbal consent from Patient.  Visit completed with Patient  on the phone.  Background:   Past Medical History:  Diagnosis Date   Anemia 1998   Anxiety    Arthritis    big toe    Asthma    BRCA1 negative    BRCA2 negative    Breast cancer (HCC) 2006   chem/radiation/2years tamoxifen   Chronic neck and back pain    Crohn's disease (HCC)    Depression    Endometriosis    Fibroid    GERD (gastroesophageal reflux disease)    History of chemotherapy 01/2005   Hx of tamoxifen therapy    Hx of transfusion of packed red blood cells    Hypertension    IBS (irritable bowel syndrome)    Kidney stones    Lymphedema of arm    Migraines    just at dx of breast CA   Mucinous cystadenoma of ovary    left   Neuropathy    Paresthesias    Peripheral neuropathy    Personal history of chemotherapy    Personal history of radiation therapy    Radiation 04/2005   Ulcerative colitis (HCC)    with chronic diarrhea   Urinary tract infection    x 3   Vertigo 2014   Vitamin D  deficiency     Assessment: Patient Reported Symptoms:  Cognitive Cognitive Status: Alert and oriented to person, place, and time, Normal speech and language skills Cognitive/Intellectual Conditions Management [RPT]: None reported or documented in medical history or problem list   Health Maintenance Behaviors: Annual physical exam, Healthy diet, Immunizations, Sleep adequate Health Facilitated by: Healthy diet, Pain control, Rest, Stress management  Neurological Neurological Review of Symptoms: No symptoms reported    HEENT HEENT Symptoms Reported: No symptoms reported      Cardiovascular Cardiovascular Symptoms Reported: No symptoms reported    Respiratory Respiratory Symptoms  Reported: No symptoms reported    Endocrine Endocrine Symptoms Reported: No symptoms reported Is patient diabetic?: Yes (Prediabetes) Is patient checking blood sugars at home?: No Endocrine Self-Management Outcome: 3 (uncertain)  Gastrointestinal Gastrointestinal Symptoms Reported: No symptoms reported Additional Gastrointestinal Details: UC flare has resolved Gastrointestinal Management Strategies: Adequate rest, Medication therapy Gastrointestinal Self-Management Outcome: 4 (good)    Genitourinary Genitourinary Symptoms Reported: No symptoms reported    Integumentary Integumentary Symptoms Reported: No symptoms reported    Musculoskeletal Musculoskelatal Symptoms Reviewed: Back pain, Joint pain, Limited mobility, Muscle pain Additional Musculoskeletal Details: related to ankylosing Spondylitis Musculoskeletal Management Strategies: Adequate rest, Medication therapy, Routine screening Musculoskeletal Self-Management Outcome: 1 (very bad) Falls in the past year?: No Number of falls in past year: 1 or less Was there an injury with Fall?: No Fall Risk Category Calculator: 0 Patient Fall Risk Level: Low Fall Risk Patient at Risk for Falls Due to: Impaired mobility, History of fall(s) Fall risk Follow up: Falls evaluation completed, Education provided  Psychosocial Psychosocial Symptoms Reported: No symptoms reported   Major Change/Loss/Stressor/Fears (CP): Medical condition, self, Medical condition, family Techniques to [employer] with Loss/Stress/Change: Diversional activities Quality of Family Relationships: supportive Do you feel physically threatened by others?: No    06/06/2024    PHQ2-9 Depression Screening   Sargent Mankey interest or pleasure in doing things    Feeling down, depressed,  or hopeless    PHQ-2 - Total Score    Trouble falling or staying asleep, or sleeping too much    Feeling tired or having Nimo Verastegui energy    Poor appetite or overeating     Feeling bad about yourself - or  that you are a failure or have let yourself or your family down    Trouble concentrating on things, such as reading the newspaper or watching television    Moving or speaking so slowly that other people could have noticed.  Or the opposite - being so fidgety or restless that you have been moving around a lot more than usual    Thoughts that you would be better off dead, or hurting yourself in some way    PHQ2-9 Total Score    If you checked off any problems, how difficult have these problems made it for you to do your work, take care of things at home, or get along with other people    Depression Interventions/Treatment      There were no vitals filed for this visit.  Medications Reviewed Today     Reviewed by Morgan Clayborne CROME, RN (Registered Nurse) on 06/06/24 at 1251  Med List Status: <None>   Medication Order Taking? Sig Documenting Provider Last Dose Status Informant  albuterol  (VENTOLIN  HFA) 108 (90 Base) MCG/ACT inhaler [id number]  Inhale 2 puffs into the lungs every 6 (six) hours as needed for wheezing or shortness of breath. Georgina Speaks, FNP  Active   amitriptyline  (ELAVIL ) 50 MG tablet [id number]  Take 1 tablet (50 mg total) by mouth at bedtime. Georgina Speaks, FNP  Active   atorvastatin  (LIPITOR) 20 MG tablet [id number]  Take 1 tablet (20 mg total) by mouth daily. Georgina Speaks, FNP  Active   BREO ELLIPTA  200-25 MCG/ACT AEPB [id number]  Inhale into the lungs. [provider]  Active   busPIRone  (BUSPAR ) 5 MG tablet [id number]  TAKE ONE TABLET BY MOUTH AT BEDTIME Georgina Speaks, FNP  Active   diphenhydrAMINE  (BENADRYL ) 25 mg capsule [id number]  Take 25 mg by mouth every 6 (six) hours as needed for allergies. [provider]  Active   EPINEPHrine  (EPIPEN  2-PAK) 0.3 mg/0.3 mL IJ SOAJ injection [id number]  Inject 0.3 mg into the muscle as needed for anaphylaxis. Georgina Speaks, FNP  Active   eszopiclone  (LUNESTA ) 2 MG TABS tablet [id number]  TAKE ONE TABLET BY MOUTH BEFORE  BEDTIME Georgina Speaks, FNP  Active   folic acid (FOLVITE) 1 MG tablet [id number]  Take 3 mg by mouth daily. [provider]  Active Self  golimumab  (SIMPONI  ARIA) 50 MG/4ML SOLN injection [id number]  50 mg See admin instructions. Infusion every 8 weeks, [facility] [provider]  Active Self  hydroxychloroquine (PLAQUENIL) 200 MG tablet [id number]  Take 200 mg by mouth 2 (two) times daily. [provider]  Active    Patient not taking:   Discontinued 11/02/20 0709          Med Note (Nohelani Benning L   Wed Feb 26, 2020 10:00 AM)    levocetirizine (XYZAL ) 5 MG tablet [id number]  TAKE ONE TABLET BY MOUTH EVERY EVENING [name], MD  Active   methotrexate (50 MG/ML) 1 g injection [id number]  Inject 1 g into the vein every Monday. [provider]  Active Self  olmesartan  (BENICAR ) 20 MG tablet [id number]  Take 1 tablet (20 mg total) by mouth daily. Georgina Speaks, FNP  Active   omeprazole  (PRILOSEC) 40 MG  capsule [id number]  Take 1 capsule (40 mg total) by mouth daily. [name], MD  Active Self  OVER THE COUNTER MEDICATION [id number]  Aleve  cream for pain [provider]  Active Self  phentermine  15 MG capsule [id number]  Take 1 capsule (15 mg total) by mouth every morning. Georgina Speaks, FNP  Active   predniSONE  (DELTASONE ) 20 MG tablet [id number] Yes Take 20 mg by mouth daily with breakfast.  Patient taking differently: Take 20 mg by mouth daily with breakfast. Patient was instructed to take 1/2 tablet daily with breakfast per Dr. Rollin. She will complete this course at the end of the week.   [provider]  Active   tiZANidine (ZANAFLEX) 2 MG tablet [id number]  Take 2 mg by mouth 3 (three) times daily. [provider]  Active Self           Med Note (Orange Hilligoss L   Tue Dec 19, 2023 12:51 PM) Patient is taking as needed   traMADol  (ULTRAM ) 50 MG tablet [id number]  Take 1 tablet (50 mg total) by mouth every 6  (six) hours as needed. Yolande Lamar BROCKS, MD  Active   Vitamin D , Ergocalciferol , (DRISDOL ) 1.25 MG (50000 UNIT) CAPS capsule [id number]  Take 1 capsule (50,000 Units total) by mouth every 7 (seven) days. Georgina Speaks, FNP  Active             Recommendation:   Specialty provider follow-up with Dr. Lavell and Dr. Rollin for management of your UC and RA as directed  Continue Current Plan of Care  Follow Up Plan:   Closing From:  Complex Care Management  Clayborne Ly RN BSN CCM [facility], [facility]  [facility], [facility] Nurse Care Coordinator  Direct Dial: [phone] Website: [email or web address]

## 2024-06-20 ENCOUNTER — Encounter: Payer: Self-pay | Admitting: Nurse Practitioner

## 2024-06-24 DIAGNOSIS — M459 Ankylosing spondylitis of unspecified sites in spine: Secondary | ICD-10-CM | POA: Diagnosis not present

## 2024-06-24 DIAGNOSIS — K5190 Ulcerative colitis, unspecified, without complications: Secondary | ICD-10-CM | POA: Diagnosis not present

## 2024-06-25 ENCOUNTER — Telehealth: Payer: Self-pay

## 2024-06-25 NOTE — Progress Notes (Unsigned)
 Complex Care Management Care Guide Note  06/25/2024 Name: Gabrielle Hawkins MRN: [id number] DOB: 16-May-1966  Gabrielle Hawkins is a 58 y.o. year old female who is a primary care patient of Georgina Speaks, FNP and is actively engaged with the care management team. I reached out to Suzen JAYSON Kenner by phone today to assist with re-scheduling  with the Licensed Clinical Child psychotherapist.  Follow up plan: Unsuccessful telephone outreach attempt made. A HIPAA compliant phone message was left for the patient providing contact information and requesting a return call.  [facility]  [facility], [facility] Guide  Direct Dial: [phone]  Fax [phone]

## 2024-06-26 ENCOUNTER — Telehealth

## 2024-06-28 ENCOUNTER — Telehealth: Admitting: Licensed Clinical Social Worker

## 2024-07-23 ENCOUNTER — Other Ambulatory Visit: Payer: Self-pay | Admitting: Nurse Practitioner

## 2024-07-23 DIAGNOSIS — F419 Anxiety disorder, unspecified: Secondary | ICD-10-CM

## 2024-08-05 ENCOUNTER — Other Ambulatory Visit: Payer: Self-pay | Admitting: Internal Medicine

## 2024-08-05 DIAGNOSIS — Z1231 Encounter for screening mammogram for malignant neoplasm of breast: Secondary | ICD-10-CM

## 2024-09-03 ENCOUNTER — Ambulatory Visit
Admission: RE | Admit: 2024-09-03 | Discharge: 2024-09-03 | Disposition: A | Discharge status: Home / Self Care | Source: Ambulatory Visit | Attending: Internal Medicine | Admitting: Internal Medicine

## 2024-09-03 DIAGNOSIS — Z1231 Encounter for screening mammogram for malignant neoplasm of breast: Secondary | ICD-10-CM

## 2024-09-17 ENCOUNTER — Other Ambulatory Visit: Payer: Self-pay | Admitting: Nurse Practitioner

## 2024-09-17 DIAGNOSIS — R202 Paresthesia of skin: Secondary | ICD-10-CM

## 2024-10-02 ENCOUNTER — Ambulatory Visit: Payer: Self-pay | Admitting: Nurse Practitioner

## 2024-10-02 ENCOUNTER — Ambulatory Visit: Payer: 59

## 2024-10-07 ENCOUNTER — Ambulatory Visit: Admitting: Nurse Practitioner

## 2024-10-24 ENCOUNTER — Ambulatory Visit: Admitting: Nurse Practitioner

## 2025-05-21 ENCOUNTER — Ambulatory Visit
